# Patient Record
Sex: Female | Born: 1950
Health system: Southern US, Community
[De-identification: ages and names within clinical notes are randomized; demographics above are authoritative.]

## PROBLEM LIST (undated history)

## (undated) DIAGNOSIS — I428 Other cardiomyopathies: Secondary | ICD-10-CM

## (undated) DIAGNOSIS — G4730 Sleep apnea, unspecified: Secondary | ICD-10-CM

## (undated) DIAGNOSIS — N179 Acute kidney failure, unspecified: Secondary | ICD-10-CM

## (undated) DIAGNOSIS — J189 Pneumonia, unspecified organism: Secondary | ICD-10-CM

## (undated) DIAGNOSIS — I10 Essential (primary) hypertension: Secondary | ICD-10-CM

## (undated) DIAGNOSIS — I723 Aneurysm of iliac artery: Secondary | ICD-10-CM

## (undated) DIAGNOSIS — K219 Gastro-esophageal reflux disease without esophagitis: Secondary | ICD-10-CM

## (undated) DIAGNOSIS — I469 Cardiac arrest, cause unspecified: Secondary | ICD-10-CM

## (undated) DIAGNOSIS — Z950 Presence of cardiac pacemaker: Secondary | ICD-10-CM

## (undated) DIAGNOSIS — I472 Ventricular tachycardia, unspecified: Secondary | ICD-10-CM

## (undated) DIAGNOSIS — R519 Headache, unspecified: Secondary | ICD-10-CM

## (undated) DIAGNOSIS — Z95810 Presence of automatic (implantable) cardiac defibrillator: Secondary | ICD-10-CM

## (undated) DIAGNOSIS — I219 Acute myocardial infarction, unspecified: Secondary | ICD-10-CM

## (undated) DIAGNOSIS — I5022 Chronic systolic (congestive) heart failure: Secondary | ICD-10-CM

## (undated) DIAGNOSIS — M1990 Unspecified osteoarthritis, unspecified site: Secondary | ICD-10-CM

## (undated) HISTORY — DX: Acute kidney failure, unspecified: N17.9

## (undated) HISTORY — DX: Ventricular tachycardia, unspecified: I47.20

## (undated) HISTORY — PX: ABDOMINAL HYSTERECTOMY: SHX81

## (undated) HISTORY — DX: Morbid (severe) obesity due to excess calories: E66.01

## (undated) HISTORY — PX: LAPAROSCOPIC GASTRIC BANDING: SHX1100

## (undated) HISTORY — DX: Essential (primary) hypertension: I10

## (undated) HISTORY — DX: Chronic systolic (congestive) heart failure: I50.22

## (undated) HISTORY — PX: JOINT REPLACEMENT: SHX530

## (undated) HISTORY — DX: Unspecified osteoarthritis, unspecified site: M19.90

## (undated) HISTORY — DX: Other cardiomyopathies: I42.8

## (undated) HISTORY — DX: Acute myocardial infarction, unspecified: I21.9

## (undated) HISTORY — PX: CHOLECYSTECTOMY: SHX55

## (undated) HISTORY — DX: Presence of automatic (implantable) cardiac defibrillator: Z95.810

## (undated) HISTORY — DX: Ventricular tachycardia: I47.2

## (undated) HISTORY — DX: Pneumonia, unspecified organism: J18.9

## (undated) HISTORY — PX: KNEE ARTHROSCOPY: SUR90

---

## 1999-12-26 ENCOUNTER — Emergency Department (HOSPITAL_COMMUNITY): Admission: EM | Admit: 1999-12-26 | Discharge: 1999-12-26 | Payer: Self-pay | Admitting: Emergency Medicine

## 2004-02-07 ENCOUNTER — Emergency Department (HOSPITAL_COMMUNITY): Admission: EM | Admit: 2004-02-07 | Discharge: 2004-02-07 | Payer: Self-pay | Admitting: Emergency Medicine

## 2004-02-19 ENCOUNTER — Encounter (INDEPENDENT_AMBULATORY_CARE_PROVIDER_SITE_OTHER): Payer: Self-pay | Admitting: Specialist

## 2004-02-19 ENCOUNTER — Ambulatory Visit (HOSPITAL_COMMUNITY): Admission: RE | Admit: 2004-02-19 | Discharge: 2004-02-19 | Payer: Self-pay

## 2007-02-16 ENCOUNTER — Emergency Department (HOSPITAL_COMMUNITY): Admission: EM | Admit: 2007-02-16 | Discharge: 2007-02-16 | Payer: Self-pay | Admitting: Emergency Medicine

## 2007-03-23 ENCOUNTER — Encounter: Admission: RE | Admit: 2007-03-23 | Discharge: 2007-03-23 | Payer: Self-pay | Admitting: Family Medicine

## 2007-06-24 ENCOUNTER — Emergency Department (HOSPITAL_COMMUNITY): Admission: EM | Admit: 2007-06-24 | Discharge: 2007-06-24 | Payer: Self-pay | Admitting: Emergency Medicine

## 2008-02-08 ENCOUNTER — Encounter: Admission: RE | Admit: 2008-02-08 | Discharge: 2008-02-08 | Payer: Self-pay | Admitting: Family Medicine

## 2009-01-08 ENCOUNTER — Emergency Department (HOSPITAL_COMMUNITY): Admission: EM | Admit: 2009-01-08 | Discharge: 2009-01-09 | Payer: Self-pay | Admitting: Emergency Medicine

## 2009-02-12 ENCOUNTER — Emergency Department (HOSPITAL_COMMUNITY): Admission: EM | Admit: 2009-02-12 | Discharge: 2009-02-12 | Payer: Self-pay | Admitting: Emergency Medicine

## 2009-04-12 ENCOUNTER — Inpatient Hospital Stay (HOSPITAL_COMMUNITY): Admission: RE | Admit: 2009-04-12 | Discharge: 2009-04-17 | Payer: Self-pay | Admitting: Orthopedic Surgery

## 2009-04-24 ENCOUNTER — Emergency Department (HOSPITAL_COMMUNITY): Admission: EM | Admit: 2009-04-24 | Discharge: 2009-04-24 | Payer: Self-pay | Admitting: Emergency Medicine

## 2009-06-27 ENCOUNTER — Inpatient Hospital Stay (HOSPITAL_COMMUNITY): Admission: RE | Admit: 2009-06-27 | Discharge: 2009-07-01 | Payer: Self-pay | Admitting: Orthopedic Surgery

## 2010-03-09 ENCOUNTER — Emergency Department (HOSPITAL_COMMUNITY): Admission: EM | Admit: 2010-03-09 | Discharge: 2010-03-09 | Payer: Self-pay | Admitting: Emergency Medicine

## 2010-10-26 ENCOUNTER — Emergency Department (HOSPITAL_COMMUNITY)
Admission: EM | Admit: 2010-10-26 | Discharge: 2010-10-26 | Disposition: A | Discharge status: Home / Self Care | Payer: 59 | Attending: Emergency Medicine | Admitting: Emergency Medicine

## 2010-10-26 ENCOUNTER — Emergency Department (HOSPITAL_COMMUNITY): Payer: 59

## 2010-10-26 DIAGNOSIS — Z79899 Other long term (current) drug therapy: Secondary | ICD-10-CM | POA: Insufficient documentation

## 2010-10-26 DIAGNOSIS — M25519 Pain in unspecified shoulder: Secondary | ICD-10-CM | POA: Insufficient documentation

## 2010-10-26 DIAGNOSIS — Z7982 Long term (current) use of aspirin: Secondary | ICD-10-CM | POA: Insufficient documentation

## 2010-10-26 DIAGNOSIS — M25619 Stiffness of unspecified shoulder, not elsewhere classified: Secondary | ICD-10-CM | POA: Insufficient documentation

## 2010-10-26 DIAGNOSIS — M719 Bursopathy, unspecified: Secondary | ICD-10-CM | POA: Insufficient documentation

## 2010-10-26 DIAGNOSIS — I10 Essential (primary) hypertension: Secondary | ICD-10-CM | POA: Insufficient documentation

## 2010-10-26 DIAGNOSIS — M67919 Unspecified disorder of synovium and tendon, unspecified shoulder: Secondary | ICD-10-CM | POA: Insufficient documentation

## 2010-11-23 LAB — GLUCOSE, CAPILLARY: Glucose-Capillary: 120 mg/dL — ABNORMAL HIGH (ref 70–99)

## 2010-12-11 LAB — COMPREHENSIVE METABOLIC PANEL
ALT: 9 U/L (ref 0–35)
AST: 11 U/L (ref 0–37)
Albumin: 2.9 g/dL — ABNORMAL LOW (ref 3.5–5.2)
CO2: 30 mEq/L (ref 19–32)
Calcium: 9 mg/dL (ref 8.4–10.5)
Chloride: 105 mEq/L (ref 96–112)
GFR calc Af Amer: >60 mL/min (ref >60)
GFR calc non Af Amer: >60 mL/min (ref >60)
Sodium: 140 mEq/L (ref 135–145)

## 2010-12-11 LAB — TYPE AND SCREEN
ABO/RH(D): O POS
DAT, IgG: NEGATIVE

## 2010-12-11 LAB — BASIC METABOLIC PANEL
CO2: 33 mEq/L — ABNORMAL HIGH (ref 19–32)
Calcium: 8.1 mg/dL — ABNORMAL LOW (ref 8.4–10.5)
Creatinine, Ser: 0.88 mg/dL (ref 0.4–1.2)
GFR calc Af Amer: >60 mL/min (ref >60)
GFR calc non Af Amer: >60 mL/min (ref >60)
Glucose, Bld: 112 mg/dL — ABNORMAL HIGH (ref 70–99)
Potassium: 3.1 mEq/L — ABNORMAL LOW (ref 3.5–5.1)

## 2010-12-11 LAB — DIFFERENTIAL
Eosinophils Absolute: 0.1 10*3/uL (ref 0.0–0.7)
Eosinophils Relative: 2 % (ref 0–5)
Lymphs Abs: 2.3 10*3/uL (ref 0.7–4.0)
Monocytes Absolute: 0.4 10*3/uL (ref 0.1–1.0)

## 2010-12-11 LAB — HEMOGLOBIN AND HEMATOCRIT, BLOOD
HCT: 31 % — ABNORMAL LOW (ref 36.0–46.0)
Hemoglobin: 9.8 g/dL — ABNORMAL LOW (ref 12.0–15.0)

## 2010-12-11 LAB — URINALYSIS, ROUTINE W REFLEX MICROSCOPIC
Glucose, UA: NEGATIVE mg/dL
Protein, ur: NEGATIVE mg/dL
Specific Gravity, Urine: 1.027 (ref 1.005–1.030)
pH: 6 (ref 5.0–8.0)

## 2010-12-11 LAB — URINE MICROSCOPIC-ADD ON

## 2010-12-11 LAB — PROTIME-INR
INR: 1.58 — ABNORMAL HIGH (ref 0.00–1.49)
INR: 1.69 — ABNORMAL HIGH (ref 0.00–1.49)
Prothrombin Time: 17.2 seconds — ABNORMAL HIGH (ref 11.6–15.2)
Prothrombin Time: 18.7 seconds — ABNORMAL HIGH (ref 11.6–15.2)
Prothrombin Time: 19.7 seconds — ABNORMAL HIGH (ref 11.6–15.2)
Prothrombin Time: 13.2 seconds (ref 11.6–15.2)

## 2010-12-11 LAB — CBC
HCT: 31.9 % — ABNORMAL LOW (ref 36.0–46.0)
RBC: 3.88 MIL/uL (ref 3.87–5.11)
WBC: 5.8 10*3/uL (ref 4.0–10.5)

## 2010-12-13 LAB — DIFFERENTIAL
Basophils Absolute: 0 10*3/uL (ref 0.0–0.1)
Basophils Absolute: 0 10*3/uL (ref 0.0–0.1)
Basophils Relative: 1 % (ref 0–1)
Basophils Relative: 0 % (ref 0–1)
Eosinophils Absolute: 0.1 10*3/uL (ref 0.0–0.7)
Eosinophils Relative: 2 % (ref 0–5)
Lymphocytes Relative: 30 % (ref 12–46)
Lymphocytes Relative: 50 % — ABNORMAL HIGH (ref 12–46)
Lymphs Abs: 3 10*3/uL (ref 0.7–4.0)
Monocytes Absolute: 0.3 10*3/uL (ref 0.1–1.0)
Monocytes Absolute: 0.3 10*3/uL (ref 0.1–1.0)
Monocytes Relative: 5 % (ref 3–12)
Neutro Abs: 4.1 10*3/uL (ref 1.7–7.7)
Neutro Abs: 2.6 10*3/uL (ref 1.7–7.7)
Neutrophils Relative %: 64 % (ref 43–77)
Neutrophils Relative %: 43 % (ref 43–77)

## 2010-12-13 LAB — CBC
HCT: 33.3 % — ABNORMAL LOW (ref 36.0–46.0)
Hemoglobin: 10.7 g/dL — ABNORMAL LOW (ref 12.0–15.0)
MCHC: 32.1 g/dL (ref 30.0–36.0)
MCV: 82.8 fL (ref 78.0–100.0)
Platelets: 376 10*3/uL (ref 150–400)
Platelets: 221 10*3/uL (ref 150–400)
RBC: 4.02 MIL/uL (ref 3.87–5.11)
RBC: 4.41 MIL/uL (ref 3.87–5.11)
RDW: 15.4 % (ref 11.5–15.5)
WBC: 6.4 10*3/uL (ref 4.0–10.5)
WBC: 6.1 10*3/uL (ref 4.0–10.5)

## 2010-12-13 LAB — PROTIME-INR
INR: 2.1 — ABNORMAL HIGH (ref 0.00–1.49)
INR: 1.7 — ABNORMAL HIGH (ref 0.00–1.49)
INR: 2 — ABNORMAL HIGH (ref 0.00–1.49)
Prothrombin Time: 23.7 seconds — ABNORMAL HIGH (ref 11.6–15.2)
Prothrombin Time: 14.1 seconds (ref 11.6–15.2)
Prothrombin Time: 18 seconds — ABNORMAL HIGH (ref 11.6–15.2)
Prothrombin Time: 22.4 seconds — ABNORMAL HIGH (ref 11.6–15.2)

## 2010-12-13 LAB — URINALYSIS, ROUTINE W REFLEX MICROSCOPIC
Glucose, UA: NEGATIVE mg/dL
Protein, ur: NEGATIVE mg/dL
Specific Gravity, Urine: 1.024 (ref 1.005–1.030)
Urobilinogen, UA: 1 mg/dL (ref 0.0–1.0)

## 2010-12-13 LAB — ABO/RH: ABO/RH(D): O POS

## 2010-12-13 LAB — TYPE AND SCREEN
ABO/RH(D): O POS
Antibody Screen: POSITIVE
Donor AG Type: NEGATIVE
PT AG Type: NEGATIVE

## 2010-12-13 LAB — COMPREHENSIVE METABOLIC PANEL
ALT: 11 U/L (ref 0–35)
AST: 15 U/L (ref 0–37)
Albumin: 3.3 g/dL — ABNORMAL LOW (ref 3.5–5.2)
Alkaline Phosphatase: 82 U/L (ref 39–117)
BUN: 11 mg/dL (ref 6–23)
CO2: 35 mEq/L — ABNORMAL HIGH (ref 19–32)
Calcium: 9.2 mg/dL (ref 8.4–10.5)
Chloride: 103 mEq/L (ref 96–112)
Creatinine, Ser: 0.83 mg/dL (ref 0.4–1.2)
GFR calc Af Amer: >60 mL/min (ref >60)
GFR calc non Af Amer: >60 mL/min (ref >60)
Glucose, Bld: 95 mg/dL (ref 70–99)
Potassium: 3.9 mEq/L (ref 3.5–5.1)
Sodium: 141 mEq/L (ref 135–145)
Total Bilirubin: 0.5 mg/dL (ref 0.3–1.2)
Total Protein: 7.7 g/dL (ref 6.0–8.3)

## 2010-12-13 LAB — HEMOGLOBIN AND HEMATOCRIT, BLOOD
HCT: 30.7 % — ABNORMAL LOW (ref 36.0–46.0)
Hemoglobin: 9.6 g/dL — ABNORMAL LOW (ref 12.0–15.0)
Hemoglobin: 9.8 g/dL — ABNORMAL LOW (ref 12.0–15.0)

## 2010-12-15 LAB — DIFFERENTIAL
Basophils Absolute: 0 10*3/uL (ref 0.0–0.1)
Eosinophils Absolute: 0 10*3/uL (ref 0.0–0.7)
Eosinophils Relative: 0 % (ref 0–5)
Lymphocytes Relative: 20 % (ref 12–46)

## 2010-12-15 LAB — URINALYSIS, ROUTINE W REFLEX MICROSCOPIC
Glucose, UA: NEGATIVE mg/dL
Specific Gravity, Urine: 1.029 (ref 1.005–1.030)
pH: 6.5 (ref 5.0–8.0)

## 2010-12-15 LAB — COMPREHENSIVE METABOLIC PANEL
ALT: 11 U/L (ref 0–35)
AST: 16 U/L (ref 0–37)
CO2: 29 mEq/L (ref 19–32)
Chloride: 106 mEq/L (ref 96–112)
Creatinine, Ser: 0.82 mg/dL (ref 0.4–1.2)
GFR calc Af Amer: >60 mL/min (ref >60)
GFR calc non Af Amer: >60 mL/min (ref >60)
Total Bilirubin: 0.4 mg/dL (ref 0.3–1.2)

## 2010-12-15 LAB — CBC
MCHC: 32.7 g/dL (ref 30.0–36.0)
RBC: 4.31 MIL/uL (ref 3.87–5.11)
RDW: 15.1 % (ref 11.5–15.5)

## 2010-12-15 LAB — POCT CARDIAC MARKERS

## 2010-12-15 LAB — URINE MICROSCOPIC-ADD ON

## 2010-12-15 LAB — PROTIME-INR: Prothrombin Time: 13.8 seconds (ref 11.6–15.2)

## 2010-12-16 LAB — BASIC METABOLIC PANEL
BUN: 15 mg/dL (ref 6–23)
CO2: 31 mEq/L (ref 19–32)
Chloride: 103 mEq/L (ref 96–112)
Creatinine, Ser: 0.89 mg/dL (ref 0.4–1.2)
GFR calc Af Amer: >60 mL/min (ref >60)

## 2010-12-16 LAB — CBC
MCHC: 32.6 g/dL (ref 30.0–36.0)
MCV: 82.7 fL (ref 78.0–100.0)
RBC: 4.09 MIL/uL (ref 3.87–5.11)

## 2010-12-16 LAB — DIFFERENTIAL
Basophils Relative: 1 % (ref 0–1)
Eosinophils Absolute: 0.2 10*3/uL (ref 0.0–0.7)
Eosinophils Relative: 3 % (ref 0–5)
Monocytes Relative: 7 % (ref 3–12)
Neutrophils Relative %: 49 % (ref 43–77)

## 2010-12-16 LAB — POCT CARDIAC MARKERS: Troponin i, poc: <0.05 ng/mL (ref 0.00–0.09)

## 2010-12-16 LAB — HEPATIC FUNCTION PANEL
Albumin: 3 g/dL — ABNORMAL LOW (ref 3.5–5.2)
Bilirubin, Direct: <0.1 mg/dL (ref 0.0–0.3)
Total Bilirubin: 0.3 mg/dL (ref 0.3–1.2)

## 2010-12-16 LAB — LIPASE, BLOOD: Lipase: 15 U/L (ref 11–59)

## 2011-01-20 NOTE — Discharge Summary (Signed)
NAMEKHALEA, VENTURA               ACCOUNT NO.:  [id number]   MEDICAL RECORD NO.:  [id number]          PATIENT TYPE:  INP   LOCATION:  1616                         FACILITY:  [facility]   PHYSICIAN:  Georges Lynch. Gioffre, M.D.DATE OF BIRTH:  March 26, 1951   DATE OF ADMISSION:  04/12/2009  DATE OF DISCHARGE:                               DISCHARGE SUMMARY   ADMITTING DIAGNOSES:  1. Degenerative arthritis of the left hip.  2. Migraine headaches.  3. Hypertension.  4. Heart murmur.  5. Gastritis.   DISCHARGE DIAGNOSES:  1. Osteoarthritis of the left hip, status post left total hip      arthroplasty.  2. Migraine headaches.  3. Hypertension.  4. Heart murmur.  5. Gastritis.   PROCEDURE:  On April 12, 2009, patient had left total hip arthroplasty.   SURGEON:  Dr. Darrelyn Hillock.   ASSISTANT:  Dr. Charlann Boxer and Rozell Searing, P.A.-C.   Patient underwent general anesthesia.   CONSULTS:  None.   BRIEF HISTORY:  Ms. Wakeman has been followed by Dr. Darrelyn Hillock for  osteoarthritis in her hips bilaterally that has been worsening in her  left hip.  X-ray reveals end-stage arthritis in the left hip.  Patient  has not been able to complete her activities of daily living because of  the pain that she has with trying to stand and walk in the left hip.  Patient now presents for total hip arthroplasty.   LABORATORY DATA:  Preoperative CBC shows hemoglobin of 11.7, hematocrit  of 36.4, white blood cell count of 6.1, and a platelet count of 221.  Preoperative chemistry panel shows sodium of 141, potassium of 3.9,  chloride of 103, bicarb 35, glucose 95, BUN 11, and creatinine 0.83.  Preoperative UA is within normal limits.  Preoperative INR is 1.0.  Patient was not on Coumadin prior to surgery.  Serial CBCs were followed  through the patient's stay.  They dropped as low as 9.6.  Patient did  not require blood transfusion.  Patient's INR April 14, 2009, is 1.5.  She will remain on Coumadin until 3 weeks after  the date of surgery.   EKG:  EKG was performed, the last one that we have on file is Jan 25, 2003.  This was done in the emergency department, reading by Dr. Effie Shy,  and it shows no change since last tracing.  Regular rate and rhythm.  No  ST elevation or depression.   HOSPITAL COURSE:  The patient was admitted to [facility].  She  tolerated the procedure well.  Later, she was transferred to the  recovery room in the orthopedic floor.  Patient was started on a PCA and  oral analgesics for pain control following surgery.  Patient underwent  general anesthesia.  She had no difficulties with this.  Postoperative  day 1, patient had already gotten up out of bed with physical therapy.  Her hemoglobin and hematocrit were stable at 9.8 and 30.7.  Vital signs  were normal.  Patient was afebrile.  Her INR had only gotten up to 1.1  at this point.  At that time,  patient's wound showed no signs of  infection and appeared well approximated.  Postoperative day 2, patient  doing well.  Patient was able to get out of bed and ambulate with  assistance.  Hemoglobin stable at 9.5.  Patient's INR on postoperative  day 2 was 1.5.  Patient able to urinate and have bowel movements without  difficulty.  Postoperative day 3, patient was resting comfortably.  She  was sleeping when we entered the room.  She was easily arousable.  Patient was then able to walk 40 feet with assistance.  Patient's mood  appears well approximated without signs of infection.  Patient's vitals  signs are stable.  She has been afebrile throughout her hospital stay  and we plan to discharge her to skilled nursing facility in the morning.   DISCHARGE PLAN:  Patient to be transferred to a skilled nursing  facility.   DISCHARGE DIAGNOSES:  Please see above.   DISCHARGE MEDICATIONS:  1. Hydrocolapat 7.5/650 mg 1 tab daily as needed.  2. Amitriptyline 25 mg 3 tabs at bedtime.  3. Pantoprazole 40 mg 1 tab daily in the  morning.  4. Verapamil 240 mg 1 tab daily in the morning.  5. Furosemide 40 mg 1 tab daily in the morning.  6. Omega-3 fish oil.  Patient to hold this until she is finished with      Coumadin.  7. Vitamin D, patient to hold this until finished Coumadin.  8. Vitamin E, patient to hold this until finished with Coumadin.  9. Super B-Complex, patient to hold until finished with Coumadin.  10.Bufferin, patient to hold until finished with Coumadin.  11.Tylenol Extra Strength 500 mg.  Patient able to take this as      needed.  12.Percocet 10/650 one tab p.o. every 4 to 6 hours p.r.n. pain.  13.Robaxin 500 mg 1 tab p.o. t.i.d. p.r.n. muscle spasms.  14.Coumadin 5 mg dose per pharmacy, INR needs to be between 2 and 3.   DIET:  No restrictions, progress as tolerated.   ACTIVITY:  Patient is weightbearing probably about 50% to 75% of her  body weight.  Patient is to be ambulating with walker.  She needs to  continue gait training, ambulation, ADLs, PT, and OT at the skilled  nursing facility.  She is on the total hip protocol.  Patient may start  showering, may need assistance with this, however, she is not to  submerge the incision in water, cover the incision with Saran wrap for  the next 3 days.  When showering, take dressing off, cover with Saran  wrap, shower, put on a clean dressing.  Daily dressing changes need to  continue while at the skilled nursing facility.   FOLLOWUP:  She is to follow up 2 weeks from date of surgery.  Please  call the office at [phone] to arrange this appointment.  Follow up with  this patient at the [facility].   DISPOSITION:  Skilled nursing facility.   CONDITION ON DISCHARGE:  Improving.      Rozell Searing, PAC    ______________________________  Georges Lynch Darrelyn Hillock, M.D.    LD/MEDQ  D:  04/15/2009  T:  04/15/2009  Job:  [id number]

## 2011-01-20 NOTE — Discharge Summary (Signed)
Lori Beard, Lori Beard               ACCOUNT NO.:  [id number]   MEDICAL RECORD NO.:  [id number]          PATIENT TYPE:  INP   LOCATION:  1616                         [facility]ACILITY:  [facility]   PHYSICIAN:  Rozell Searing, [facility]    DATE OF BIRTH:  [birth year]   DATE OF ADMISSION:  04/12/2009  DATE OF DISCHARGE:  04/16/2009                               DISCHARGE SUMMARY   DISPOSITION:  Patient to be discharged to skilled nursing facility.   DICTATION ENDED HERE      Rozell Searing, [facility]     LD/MEDQ  D:  04/15/2009  T:  04/15/2009  Job:  [id number]

## 2011-01-20 NOTE — Discharge Summary (Signed)
NAMETASMINE, HIPWELL               ACCOUNT NO.:  [id number]   MEDICAL RECORD NO.:  [id number]          PATIENT TYPE:  INP   LOCATION:  1616                         FACILITY:  [facility]   PHYSICIAN:  Rozell Searing, [facility]    DATE OF BIRTH:  07-Mar-1951   DATE OF ADMISSION:  04/12/2009  DATE OF DISCHARGE:                               DISCHARGE SUMMARY   ADMITTING DIAGNOSES:  1. Degenerative arthritis of the left hip.  2. Migraine headaches.  3. Hypertension.  4. Heart murmur.  5. Gastritis.   DISCHARGE DIAGNOSES:  1. Degenerative arthritis of the left hip, status post left hip total      arthroplasty.  2. Migraine headaches.  3. Hypertension.  4. Heart murmur.  5. Gastritis.  6. Arthritis.   PAST SURGICAL HISTORY:  1. Hysterectomy.  2. Cholecystectomy.  3. Right knee arthroscopy.   DICTATION WAS CANCELLED.      Rozell Searing, [facility]     LD/MEDQ  D:  04/15/2009  T:  04/15/2009  Job:  [id number]

## 2011-01-20 NOTE — Op Note (Signed)
NAMEMIKALA, PODOLL               ACCOUNT NO.:  [id number]   MEDICAL RECORD NO.:  [id number]          PATIENT TYPE:  INP   LOCATION:  1616                         FACILITY:  [facility]   PHYSICIAN:  Georges Lynch. Gioffre, M.D.DATE OF BIRTH:  03-06-1951   DATE OF PROCEDURE:  04/12/2009  DATE OF DISCHARGE:                               OPERATIVE REPORT   SURGEON:  Dr. Darrelyn Hillock.   ASSISTANTS:  1. Dr. Durene Romans.  2. Rozell Searing, PA.   PREOPERATIVE DIAGNOSIS:  Degenerative arthritis of the left hip.   POSTOPERATIVE DIAGNOSIS:  Degenerative arthritis of the left hip.   OPERATION:  Left total hip arthroplasty utilizing the DePuy system.  I  utilized a size 50 Pinnacle cup with one screw for fixation.  The  femoral head stem was a Tri-Lock stem size 2, high offset.  The C  tapered head was a +1.5 thirty-six mm diameter.  The metal cup insert  was a 50-mm outside diameter, 36 mm inside diameter and the hole  eliminator was used in the cup.   PROCEDURE:  Under general anesthesia with the patient on the right side  and left side up, the patient had 2 grams of IV Ancef.  At this time,  routine orthopedic prep and draping of the left hip was carried out.  Bleeders identified and cauterized.  Self-retaining retractors were  inserted.  At this particular time, I went down and identified the  iliotibial band.  I made an incision in the band and extended it up  proximally and then I dissected the gluteal muscle by blunt dissection  with great care taken not to injure underlying sciatic nerve.  Following  that, I went down and inserted the retractors deeper with great care  once again not put any pressure on the sciatic nerve.  I then detached  the external rotators by sharp dissection with the Bovie, cauterized the  various bleeders.  We then incised the capsule and did a capsulectomy,  dislocated the head and amputated the femoral neck at the appropriate  angle.  Following that, I went down and  then utilized the box osteotome  to remove a portion of the cancellus bone from the greater trochanter.  I used a widening reamer following that.  I then began rasping up to a  size 2 for a size 2 Tri-Lock stem.  Once this was done, a sponge was  packed into the femoral canal which was later removed.  At this time, we  then reamed the acetabulum up to a size 49 for a 50-mm Pinnacle cup.  The Pinnacle cup was inserted and we measured the appropriate angle with  the Charnley guide.  We then inserted one screw for fixation.  The cup  was solidly fixed in the acetabulum even without the screw, but we did  insert one screw.  We then inserted our trial liner, went through trials  and selected a high offset size 2 femoral Tri-Lock component with a +1.5  C tapered head.  We then removed the trials and inserted our permanent  metal insert cup.  We then  irrigated out the femoral shaft again.  We  then inserted our permanent size 2 Tri-Lock stem.  We then inserted our  ball which was a 1.5 metal ball, 36 mm diameter.  We made sure the soft  tissue was cleared from the acetabulum, reduced the hip and took the hip  through  motion.  We had excellent fixation.  We thoroughly irrigated out the  hip.  I inserted two pieces of thrombin-soaked Gelfoam for hemostasis  purposes.  The wound was closed in layers in the usual fashion.  The  skin was closed with metal staples.  A sterile Neosporin dressing was  applied.           ______________________________  Georges Lynch Darrelyn Hillock, M.D.     RAG/MEDQ  D:  04/12/2009  T:  04/13/2009  Job:  [id number]   cc:   Erling Conte, MD  (cardiologist) [facility]

## 2011-01-23 NOTE — Op Note (Signed)
Lori Beard, Lori Beard                         ACCOUNT NO.:  [id number]   MEDICAL RECORD NO.:  [id number]                   PATIENT TYPE:  AMB   LOCATION:  DAY                                  FACILITY:  [facility]   PHYSICIAN:  Lorre Munroe., M.D.            DATE OF BIRTH:  1951-02-20   DATE OF PROCEDURE:  02/19/2004  DATE OF DISCHARGE:                                 OPERATIVE REPORT   PREOPERATIVE DIAGNOSIS:  Symptomatic gallstones.   POSTOPERATIVE DIAGNOSIS:  Symptomatic gallstones.   OPERATION:  Laparoscopic cholecystectomy.   SURGEON:  Zigmund Daniel, M.D.   ANESTHESIA:  General.   DESCRIPTION OF PROCEDURE:  After the patient was monitored and anesthetized  and had routine preparation and draping of the abdomen, I first infiltrated  local anesthetic just below the umbilicus and made a longitudinal incision  connecting the top of the previous lower midline incision and dissected down  to the fascia, incised it longitudinally, then bluntly entered the  peritoneum.  I noted that there were some omental adhesions in that area.  I  put in a 0 Vicryl pursestring suture and very carefully inserted a Hasson  cannula, then put in the camera.  I saw no evidence of injury to the bowel  and, in fact, the bowel was not adherent to the anterior abdominal wall.  There were a lot of adhesions in the lower midline and upper midline, but I  was able to pass the scope around the adhesions and get a good view of the  liver and gallbladder.  I then anesthetized three additional sites and put  in three ports.  I grasped the fundus of the gallbladder and retracted it  toward the right shoulder and pulled the infundibulum of the gallbladder  laterally.  I had a good view of the hepatoduodenal ligament.  I dissected  out the cystic duct and cystic artery.  I noted the emergence of the cystic  duct from the infundibulum of the gallbladder quite clearly.  I could also  see the common bile duct as  a definite structure separately.  I clipped the  cystic duct with four clips and the cystic artery with three clips and cut  each structure between the two closest to the gallbladder.  I then dissected  the gallbladder from the liver with the cautery, getting hemostasis with the  cautery.  Hemostasis was excellent.  I removed the gallbladder through the  umbilical incision and tied the pursestring suture.  I then inspected the  operative area again and found it to be free of bleeding and leakage of  bile.  I removed the lateral ports under direct vision and then allowed the  CO2 to escape and removed the epigastric port.  I closed all skin incisions  with intracuticular 4-0 Vicryl and Steri-Strips.  She tolerated the  operation very well.  Lorre Munroe., M.D.   WB/MEDQ  D:  02/19/2004  T:  02/19/2004  Job:  [id number]   cc:   L. Lupe Carney, M.D.  [address] [place]  [place] [address]  Fax: [phone]

## 2011-01-23 NOTE — H&P (Signed)
[name], [name]               ACCOUNT NO.:  [id number]   MEDICAL RECORD NO.:  [id number]          PATIENT TYPE:  INP   LOCATION:  NA                           FACILITY:  [facility]   PHYSICIAN:  Lori Lynch. Beard, M.D.DATE OF BIRTH:  [birth year]   DATE OF ADMISSION:  DATE OF DISCHARGE:                              HISTORY & PHYSICAL   CHIEF COMPLAINT:  Bilateral hip pain, worse on the left than the right.   HISTORY OF PRESENT ILLNESS:  Lori Beard is a 60 year old female who  has been followed by Dr. Darrelyn Beard for worsening low back pain that  radiates to her groins bilaterally.  MRI was done of her low back  initially and did reveal some arthritic changes.  However, plain films  of her hips revealed severe arthritis in both hip joints.  The patient  states that the left hip is bothering her worse than the right and is  preventing her from completing daily activities.  The patient states  that she has tried somewhat conservative treatment with Indocin at home  without any relief.  The next step for her will be total hip  arthroplasty on the left hip.   ALLERGIES:  NO KNOWN DRUG ALLERGIES.  The patient also denies latex,  metal or food allergies.   CURRENT MEDICATIONS:  The patient did not bring a list of her current  medications.  Old list will be faxed to our office and then sent to the  hospital prior to her surgery date.   PRIMARY CARE PHYSICIAN:  L. Lori Beard, M.D.   PAST MEDICAL HISTORY:  1. Migraine headaches.  2. Hypertension.  3. Heart murmur.  4. Gastritis.  5. Arthritis.   PAST SURGICAL HISTORY:  1. Hysterectomy.  2. Cholecystectomy.  3. Right knee arthroscopy.   REVIEW OF SYSTEMS:  GENERAL:  The patient is negative for fever or  weight loss.  HEENT:  Negative for vision change or imbalance.  DERMATOLOGIC:  Unremarkable.  RESPIRATORY:  Negative for shortness of  breath.  Negative for cough.  Negative for wheezing.  CARDIOVASCULAR:  Negative for chest pain.   Negative for palpitations.  GASTROINTESTINAL:  Positive for occasional indigestion.  Negative for diarrhea.  Negative  for blood in the stool.  GENITOURINARY:  Negative for frequent  urination.  Negative for pain with urination.  Negative for frequent  urinary tract infections.   FAMILY MEDICAL HISTORY:  Father passed at age 56.  He had congestive  heart failure and diabetes.  Mother passed at age 80 with hypertension  and renal failure.   SOCIAL HISTORY:  The patient works in Engineering geologist.  She is on her feet all  day at work.  The patient plans to go home after surgery.  She has  family that will be able to care for her.  The patient denies smoking.  The patient denies alcohol intake.   PHYSICAL EXAMINATION:  VITAL SIGNS:  Pulse 80, respirations 16, blood  pressure 130/82.  GENERAL:  Alert and oriented in no acute distress.  Patient is a good  historian.  Patient is ambulating with a  cane at this time.  HEENT:  Unremarkable.  NECK:  Supple.  Full range of motion without lymphadenopathy.  HEART:  Regular rate and rhythm.  No murmur detected.  No rub.  No  gallop.  ABDOMEN:  Soft, nontender.  Bowel sounds are present.  No guarding with  palpation.  EXTREMITIES:  Pain with range of motion of hips bilaterally with the  left being more painful than the right.  SKIN:  Unremarkable.  PERIPHERAL VASCULAR:  Carotid pulses are 2+ bilaterally without bruit.  Radial pulses 2+ bilaterally.  Dorsalis pedis pulses 2+ bilaterally.   IMPRESSION:  Osteoarthritis of the hips bilaterally, left worse than the  right.   PLAN:  Left total hip arthroplasty.  It should be performed by Dr.  Darrelyn Beard April 12, 2009.  The patient should be cleared preoperatively  by her primary care physician, Dr. Lupe Beard.      Lori Beard, PAC    ______________________________  Lori Lynch Lori Beard, M.D.    LD/MEDQ  D:  04/10/2009  T:  04/10/2009  Job:  [id number]

## 2011-06-17 LAB — POCT CARDIAC MARKERS
CKMB, poc: <1 — ABNORMAL LOW
Myoglobin, poc: 47.2
Operator id: 279831
Troponin i, poc: <0.05
Troponin i, poc: <0.05

## 2011-09-29 ENCOUNTER — Ambulatory Visit (INDEPENDENT_AMBULATORY_CARE_PROVIDER_SITE_OTHER): Payer: 59

## 2011-09-29 DIAGNOSIS — R5381 Other malaise: Secondary | ICD-10-CM

## 2011-09-29 DIAGNOSIS — I10 Essential (primary) hypertension: Secondary | ICD-10-CM | POA: Diagnosis not present

## 2011-09-29 DIAGNOSIS — R5383 Other fatigue: Secondary | ICD-10-CM

## 2011-09-29 DIAGNOSIS — D500 Iron deficiency anemia secondary to blood loss (chronic): Secondary | ICD-10-CM

## 2011-09-29 DIAGNOSIS — K219 Gastro-esophageal reflux disease without esophagitis: Secondary | ICD-10-CM

## 2011-11-11 ENCOUNTER — Other Ambulatory Visit: Payer: Self-pay | Admitting: Internal Medicine

## 2011-11-11 NOTE — Telephone Encounter (Signed)
Lori Beard called from [facility] stating that patient is there yelling that we sent in her Hydrocodone refill and that the pharmacy is lying to her.  Per our notes, patient has not been seen since 09/29/11 for knee strain.  Advised pharmacist that most likely patient would need OV before we could refill.  We have not refilled yet.  Will have PA review and advise.

## 2011-11-11 NOTE — Telephone Encounter (Signed)
We have not seen her since 09/29/2011.  Her last visit was at ED where she got Norco - she has not requested it from [place] and she will need an OV before we give her more.

## 2011-11-11 NOTE — Telephone Encounter (Signed)
Pt is requesting a refill on hydrocodone.      

## 2011-11-12 ENCOUNTER — Ambulatory Visit: Payer: 59

## 2011-11-12 ENCOUNTER — Ambulatory Visit (INDEPENDENT_AMBULATORY_CARE_PROVIDER_SITE_OTHER): Payer: 59 | Admitting: Family Medicine

## 2011-11-12 DIAGNOSIS — M25559 Pain in unspecified hip: Secondary | ICD-10-CM

## 2011-11-12 DIAGNOSIS — M25569 Pain in unspecified knee: Secondary | ICD-10-CM

## 2011-11-12 MED ORDER — TRIAMCINOLONE ACETONIDE 40 MG/ML IJ SUSP: 80.0000 mg | Freq: Once | INTRAMUSCULAR | Status: DC

## 2011-11-12 MED ORDER — HYDROCODONE-ACETAMINOPHEN 7.5-750 MG PO TABS: 1.0000 | ORAL_TABLET | Freq: Two times a day (BID) | ORAL | Status: DC

## 2011-11-12 NOTE — Telephone Encounter (Signed)
SPOKE WITH PT==SHE WILL RTC FOR OV.

## 2011-11-12 NOTE — Progress Notes (Signed)
  Subjective:    Patient ID: Lori Beard, female    DOB: March 06, 1951, 61 y.o.   MRN: [id number]  HPI HPI: Right leg pain for 11/2 weeks. H/O bilat hip surgery in  Aug and Oct 2010. Chronic swelling of the RLE.  No change in swelling.  RLE pain worse with beingstationary. Weightbearing ecacerbates her pain. Was prescribed hydrocodone by Dr. Lupe Carney. Using one per day until she has ran out .  No numbness of tingling.  No CP or SOB.  Exam    Review of Systems  Constitutional: Negative for fever.  Respiratory: Negative for cough and shortness of breath.   Cardiovascular: Negative for chest pain.  Neurological: Negative for numbness.       Objective:   Physical Exam  Constitutional: She is oriented to person, place, and time. She appears well-developed. No distress.  Musculoskeletal: She exhibits tenderness. She exhibits no edema.       Right hip: She exhibits decreased range of motion and decreased strength.       Right knee: She exhibits decreased range of motion, deformity, abnormal alignment, bony tenderness and abnormal meniscus. She exhibits no erythema, no LCL laxity, normal patellar mobility and no MCL laxity. tenderness found.       Legs: Neurological: She is alert and oriented to person, place, and time.  Skin: Skin is warm and dry. No rash noted. She is not diaphoretic. No erythema.    2v right hip xrays:  Normal alignment of prosthesis.  2v right knee: multicompartment knee djd   Procedure: after obtaining verbal consent the right knee was cleansed with alcohol and betadine.  2:5 mixture of kenalog 40 mg/ml and marcaine was injected into the right knee using the anteriolateral approach.  Procedure tolerated.  No complications. Risk, benefits and treatment alternatives discussed prior to the procedure.                              Assessment & Plan:  Exacerbated right knee arthritis  Right hip pain and weakness  Injection as above.  Hydrocodone 7.5 mg 1 po  bid prn  F/U with her orthopaedist

## 2011-11-12 NOTE — Patient Instructions (Addendum)
Avoid oral NSAIDS due to elevated blood pressure. Follow up with your orthopaedist for further assessment. Ice your knees 20 minutes every 4-6 hours as needed, Wear and Tear Disorders of the Knee (Arthritis, Osteoarthritis) Everyone will experience wear and tear injuries (arthritis, osteoarthritis) of the knee. These are the changes we all get as we age. They come from the joint stress of daily living. The amount of cartilage damage in your knee and your symptoms determine if you need surgery. Mild problems require approximately two months recovery time. More severe problems take several months to recover. With mild problems, your surgeon may find worn and rough cartilage surfaces. With severe changes, your surgeon may find cartilage that has completely worn away and exposed the bone. Loose bodies of bone and cartilage, bone spurs (excess bone growth), and injuries to the menisci (cushions between the large bones of your leg) are also common. All of these problems can cause pain. For a mild wear and tear problem, rough cartilage may simply need to be shaved and smoothed. For more severe problems with areas of exposed bone, your surgeon may use an instrument for roughing up the bone surfaces to stimulate new cartilage growth. Loose bodies are usually removed. Torn menisci may be trimmed or repaired. ABOUT THE ARTHROSCOPIC PROCEDURE Arthroscopy is a surgical technique. It allows your orthopedic surgeon to diagnose and treat your knee injury with accuracy. The surgeon looks into your knee through a small scope. The scope is like a small (pencil-sized) telescope. Arthroscopy is less invasive than open knee surgery. You can expect a more rapid recovery. After the procedure, you will be moved to a recovery area until most of the effects of the medication have worn off. Your caregiver will discuss the test results with you. RECOVERY The severity of the arthritis and the type of procedure performed will determine  recovery time. Other important factors include age, physical condition, medical conditions, and the type of rehabilitation program. Strengthening your muscles after arthroscopy helps guarantee a better recovery. Follow your caregiver's instructions. Use crutches, rest, elevate, ice, and do knee exercises as instructed. Your caregivers will help you and instruct you with exercises and other physical therapy required to regain your mobility, muscle strength, and functioning following surgery. Only take over-the-counter or prescription medicines for pain, discomfort, or fever as directed by your caregiver.  SEEK MEDICAL CARE IF:   There is increased bleeding (more than a small spot) from the wound.   You notice redness, swelling, or increasing pain in the wound.   Pus is coming from wound.   You develop an unexplained oral temperature above 102 F (38.9 C) , or as your caregiver suggests.   You notice a foul smell coming from the wound or dressing.   You have severe pain with motion of the knee.  SEEK IMMEDIATE MEDICAL CARE IF:   You develop a rash.   You have difficulty breathing.   You have any allergic problems.  MAKE SURE YOU:   Understand these instructions.   Will watch your condition.   Will get help right away if you are not doing well or get worse.  Document Released: 08/21/2000 Document Revised: 08/13/2011 Document Reviewed: 01/18/2008 [facility] Patient Information 2012 [place], [place].Knee Injection Joint injections are shots. Your caregiver will place a needle into your knee joint. The needle is used to put medicine into the joint. These shots can be used to help treat different painful knee conditions such as osteoarthritis, bursitis, local flare-ups of rheumatoid arthritis,  and pseudogout. Anti-inflammatory medicines such as corticosteroids and anesthetics are the most common medicines used for joint and soft tissue injections.  PROCEDURE  The skin over the kneecap will be  cleaned with an antiseptic solution.   Your caregiver will inject a small amount of a local anesthetic (a medicine like Novocaine) just under the skin in the area that was cleaned.   After the area becomes numb, a second injection is done. This second injection usually includes an anesthetic and an anti-inflammatory medicine called a steroid or cortisone. The needle is carefully placed in between the kneecap and the knee, and the medicine is injected into the joint space.   After the injection is done, the needle is removed. Your caregiver may place a bandage over the injection site. The whole procedure takes no more than a couple of minutes.  BEFORE THE PROCEDURE  Wash all of the skin around the entire knee area. Try to remove any loose, scaling skin. There is no other specific preparation necessary unless advised otherwise by your caregiver. LET YOUR CAREGIVER KNOW ABOUT:   Allergies.   Medications taken including herbs, eye drops, over the counter medications, and creams.   Use of steroids (by mouth or creams).   Possible pregnancy, if applicable.   Previous problems with anesthetics or Novocaine.   History of blood clots (thrombophlebitis).   History of bleeding or blood problems.   Previous surgery.   Other health problems.  RISKS AND COMPLICATIONS Side effects from cortisone shots are rare. They include:   Slight bruising of the skin.   Shrinkage of the normal fatty tissue under the skin where the shot was given.   Increase in pain after the shot.   Infection.   Weakening of tendons or tendon rupture.   Allergic reaction to the medicine.   Diabetics may have a temporary increase in their blood sugar after a shot.   Cortisone can temporarily weaken the immune system. While receiving these shots, you should not get certain vaccines. Also, avoid contact with anyone who has chickenpox or measles. Especially if you have never had these diseases or have not been  previously immunized. Your immune system may not be strong enough to fight off the infection while the cortisone is in your system.  AFTER THE PROCEDURE   You can go home after the procedure.   You may need to put ice on the joint 15 to 20 minutes every 3 or 4 hours until the pain goes away.   You may need to put an elastic bandage on the joint.  HOME CARE INSTRUCTIONS   Only take over-the-counter or prescription medicines for pain, discomfort, or fever as directed by your caregiver.   You should avoid stressing the joint. Unless advised otherwise, avoid activities that put a lot of pressure on a knee joint, such as:   Jogging.   Bicycling.   Recreational climbing.   Hiking.   Laying down and elevating the leg/knee above the level of your heart can help to minimize swelling.  SEEK MEDICAL CARE IF:   You have repeated or worsening swelling.   There is drainage from the puncture area.   You develop red streaking that extends above or below the site where the needle was inserted.  SEEK IMMEDIATE MEDICAL CARE IF:   You develop a fever.   You have pain that gets worse even though you are taking pain medicine.   The area is red and warm, and you have trouble moving  the joint.  MAKE SURE YOU:   Understand these instructions.   Will watch your condition.   Will get help right away if you are not doing well or get worse.  Document Released: 11/15/2006 Document Revised: 08/13/2011 Document Reviewed: 08/12/2007 [facility] Patient Information 2012 [place], [place].

## 2011-11-15 ENCOUNTER — Other Ambulatory Visit: Payer: Self-pay

## 2011-12-30 DIAGNOSIS — M169 Osteoarthritis of hip, unspecified: Secondary | ICD-10-CM | POA: Diagnosis not present

## 2012-01-22 ENCOUNTER — Encounter (INDEPENDENT_AMBULATORY_CARE_PROVIDER_SITE_OTHER): Payer: Self-pay | Admitting: Surgery

## 2012-01-22 ENCOUNTER — Ambulatory Visit (INDEPENDENT_AMBULATORY_CARE_PROVIDER_SITE_OTHER): Payer: Commercial Managed Care - PPO | Admitting: Surgery

## 2012-01-22 VITALS — BP 162/108 | HR 72 | Temp 97.1°F | Resp 16 | Ht 64.0 in | Wt 252.4 lb

## 2012-01-22 DIAGNOSIS — Z1231 Encounter for screening mammogram for malignant neoplasm of breast: Secondary | ICD-10-CM

## 2012-01-22 DIAGNOSIS — E669 Obesity, unspecified: Secondary | ICD-10-CM | POA: Diagnosis not present

## 2012-01-22 NOTE — Progress Notes (Signed)
Chief Complaint:  Morbid obesity BMI 43   History of Present Illness:  Lori Beard is an 61 y.o. female who has been to our seminar and is referred by Dr. Lupe Beard with obesity issues.  She has had bilateral hip surgery for arthritis z was then reasonably healthy. She had a laparoscopic cholecystectomy by Lori Beard in June of 2005. She is thought about this surgery and was interested in lap band and wide answered her questions specifically about how it works and how this will change her life. She wants to proceed with the workup.  Past Medical History  Diagnosis Date  . Arthritis     Past Surgical History  Procedure Date  . Abdominal hysterectomy   . Knee arthroscopy   . Cholecystectomy   . Hip surgery     bilateral    Current Outpatient Prescriptions  Medication Sig Dispense Refill  . amitriptyline (ELAVIL) 25 MG tablet Take 75 mg by mouth at bedtime.      [name] CRANBERRY PO Take by mouth daily.      [name] etodolac (LODINE) 400 MG tablet Take 400 mg by mouth 2 (two) times daily.      . furosemide (LASIX) 40 MG tablet Take 40 mg by mouth as needed.      [name] HYDROcodone-acetaminophen (VICODIN ES) 7.5-750 MG per tablet Take 1 tablet by mouth 2 (two) times daily.  30 tablet  0  . nebivolol (BYSTOLIC) 5 MG tablet Take 5 mg by mouth daily.      . pantoprazole (PROTONIX) 40 MG tablet Take 40 mg by mouth daily before breakfast.      . verapamil (COVERA HS) 240 MG (CO) 24 hr tablet Take 240 mg by mouth daily before breakfast.       Current Facility-Administered Medications  Medication Dose Route Frequency Provider Last Rate Last Dose  . triamcinolone acetonide (KENALOG-40) injection 80 mg  80 mg Intra-articular Once Otho Darner, MD       Ace inhibitors and Morphine and related Family History  Problem Relation Age of Onset  . Kidney disease Mother   . Heart disease Father   . Cancer Father     colon  . Cancer Sister     breast   Social History:   reports that she has never  smoked. She does not have any smokeless tobacco history on file. She reports that she does not drink alcohol or use illicit drugs.   REVIEW OF SYSTEMS - PERTINENT POSITIVES ONLY: Negative for DVT  Physical Exam:   Blood pressure 162/108, pulse 72, temperature 97.1 F (36.2 C), temperature source Temporal, resp. rate 16, height 5\' 4"  (1.626 m), weight 252 lb 6.4 oz (114.488 kg). Body mass index is 43.32 kg/(m^2).  Gen:  WDWN AAF NAD  Neurological: Alert and oriented to person, place, and time. Motor and sensory function is grossly intact  Head: Normocephalic and atraumatic.  Eyes: Conjunctivae are normal. Pupils are equal, round, and reactive to light. No scleral icterus.  Neck: Normal range of motion. Neck supple. No tracheal deviation or thyromegaly present.  Cardiovascular:  SR without murmurs or gallops.  No carotid bruits Respiratory: Effort normal.  No respiratory distress. No chest wall tenderness. Breath sounds normal.  No wheezes, rales or rhonchi.  Abdomen:  Nontender, obese GU: Musculoskeletal: Normal range of motion. Extremities are nontender. No cyanosis, edema or clubbing noted Lymphadenopathy: No cervical, preauricular, postauricular or axillary adenopathy is present Skin: Skin is warm and dry. No rash  noted. No diaphoresis. No erythema. No pallor. Pscyh: Normal mood and affect. Behavior is normal. Judgment and thought content normal.   LABORATORY RESULTS: No results found for this or any previous visit (from the past 48 hour(s)).  RADIOLOGY RESULTS: No results found.  Problem List: There is no problem list on file for this patient.   Assessment & Plan: Morbid obesity with BMI 43.  Wants lapband .  Will begin the workup.      Matt B. Daphine Deutscher, MD, [facility] [phone] beeper [phone]  01/22/2012 11:20 AM

## 2012-01-22 NOTE — Progress Notes (Signed)
Addended by: Lenise Herald on: 01/22/2012 11:46 AM   Modules accepted: Orders

## 2012-01-28 ENCOUNTER — Other Ambulatory Visit: Payer: Self-pay | Admitting: Family Medicine

## 2012-02-05 ENCOUNTER — Encounter: Payer: Self-pay | Admitting: *Deleted

## 2012-02-05 ENCOUNTER — Encounter: Payer: 59 | Attending: Surgery | Admitting: *Deleted

## 2012-02-05 DIAGNOSIS — Z01818 Encounter for other preprocedural examination: Secondary | ICD-10-CM | POA: Insufficient documentation

## 2012-02-05 DIAGNOSIS — Z713 Dietary counseling and surveillance: Secondary | ICD-10-CM | POA: Insufficient documentation

## 2012-02-05 DIAGNOSIS — E669 Obesity, unspecified: Secondary | ICD-10-CM | POA: Insufficient documentation

## 2012-02-05 NOTE — Progress Notes (Addendum)
  Pre-Op Assessment Visit:  Pre-Operative LAGB Surgery  Medical Nutrition Therapy:  Appt start time: 0800   End time:  0900.  Patient was seen on 02/05/2012 for Pre-Operative LAGB Nutrition Assessment. Assessment and letter of approval faxed to [facility] Bariatric Surgery Program coordinator on 02/05/2012.  Approval letter sent to [facility] center and will be available in the chart under the media tab.  TANITA  BODY COMP RESULTS  02/05/12   %Fat 46.6%   Fat Mass (lbs) 119.5   Fat Free Mass (lbs) 137.0   Total Body Water (lbs) 100.5   Handouts given during visit include:  Pre-Op Goals   Bariatric Surgery Protein Shakes  Patient to call for Pre-Op and Post-Op Nutrition Education at [facility] when surgery is scheduled.

## 2012-02-05 NOTE — Patient Instructions (Signed)
   Follow Pre-Op Nutrition Goals to prepare for Lap Band Surgery.   Call the [facility] at [phone] once you have been given your surgery date to enrolled in the Pre-Op Nutrition Class. You will need to attend this nutrition class 3-4 weeks prior to your surgery.  

## 2012-02-10 ENCOUNTER — Other Ambulatory Visit: Payer: Self-pay | Admitting: Family Medicine

## 2012-02-10 MED ORDER — AMITRIPTYLINE HCL 25 MG PO TABS: 75.0000 mg | ORAL_TABLET | Freq: Every day | ORAL | Status: DC

## 2012-02-12 ENCOUNTER — Telehealth (INDEPENDENT_AMBULATORY_CARE_PROVIDER_SITE_OTHER): Payer: Self-pay | Admitting: Surgery

## 2012-02-12 NOTE — Telephone Encounter (Signed)
Per [facility] @ Dr Leveda Anna office, the patient came in to their office on 02/09/12 and answered to another patient's name, filled out the initial paperwork only for them to realize she was not the correct patient and that she did not have an appt that date. Per [facility] @ Dr Leveda Anna, they would like [place] to refer her to a provider that is in-network with both of her insurance companies [facility] & Medicare)....cef

## 2012-02-12 NOTE — Telephone Encounter (Signed)
02/12/12 referral faxed to [facility]. Emailed and mailed the patient the information for [facility] office and advised the patient that she will need to call their office and schedule her appointment as that is the preferred method of [facility]..Thomasene Lot

## 2012-02-15 ENCOUNTER — Ambulatory Visit (HOSPITAL_BASED_OUTPATIENT_CLINIC_OR_DEPARTMENT_OTHER): Payer: 59 | Attending: Surgery | Admitting: Radiology

## 2012-02-15 ENCOUNTER — Encounter (HOSPITAL_COMMUNITY)
Admission: RE | Disposition: A | Discharge status: Home / Self Care | Payer: Self-pay | Source: Ambulatory Visit | Attending: Surgery

## 2012-02-15 ENCOUNTER — Ambulatory Visit (HOSPITAL_COMMUNITY)
Admission: RE | Admit: 2012-02-15 | Discharge: 2012-02-15 | Disposition: A | Discharge status: Home / Self Care | Payer: 59 | Source: Ambulatory Visit | Attending: Surgery | Admitting: Surgery

## 2012-02-15 VITALS — Ht 63.0 in | Wt 260.0 lb

## 2012-02-15 DIAGNOSIS — G4730 Sleep apnea, unspecified: Secondary | ICD-10-CM

## 2012-02-15 DIAGNOSIS — G4733 Obstructive sleep apnea (adult) (pediatric): Secondary | ICD-10-CM | POA: Insufficient documentation

## 2012-02-15 DIAGNOSIS — Z01818 Encounter for other preprocedural examination: Secondary | ICD-10-CM | POA: Insufficient documentation

## 2012-02-15 DIAGNOSIS — E669 Obesity, unspecified: Secondary | ICD-10-CM

## 2012-02-15 HISTORY — PX: BREATH TEK H PYLORI: SHX5422

## 2012-02-15 HISTORY — DX: Sleep apnea, unspecified: G47.30

## 2012-02-15 SURGERY — BREATH TEST, FOR HELICOBACTER PYLORI

## 2012-02-16 ENCOUNTER — Encounter (HOSPITAL_COMMUNITY): Payer: Self-pay | Admitting: Surgery

## 2012-02-20 DIAGNOSIS — G4733 Obstructive sleep apnea (adult) (pediatric): Secondary | ICD-10-CM

## 2012-02-20 NOTE — Procedures (Signed)
Lori Beard, Lori Beard               ACCOUNT NO.:  [id number]  MEDICAL RECORD NO.:  [id number]          PATIENT TYPE:  OUT  LOCATION:  SLEEP CENTER                 FACILITY:  [facility]  PHYSICIAN:  Nakya Weyand D. Maple Hudson, MD, FCCP, FACPDATE OF BIRTH:  05[birth year]08[birth year]  DATE OF STUDY:  02/15/2012                           NOCTURNAL POLYSOMNOGRAM  REFERRING PHYSICIAN:  Molli Hazard B. Daphine Deutscher, MD  REFERRING DOCTOR:  Thornton Park. Daphine Deutscher, MD  INDICATION FOR STUDY:  Hypersomnia with sleep apnea.  EPWORTH SLEEPINESS SCORE:  9/24.  BMI:  46.1, weight 260 pounds, height 63 inches, neck 15.5 inches.  HOME MEDICATIONS:  Charted and reviewed.  SLEEP ARCHITECTURE:  Total sleep time 259 minutes with sleep efficiency 72%.  Stage I was 17%, stage II 78.6%, stage III 0.2%, REM 4.2% of total sleep time.  Sleep latency 37 minutes, REM latency 180 minutes, awake after sleep onset 31.5 minutes.  Arousal index 13.7.  BEDTIME MEDICATION:  Amitriptyline.  RESPIRATORY DATA:  Apnea-hypopnea index (AHI) 13 per hour.  A total of 56 events was scored including 13 obstructive apneas, 1 central apnea, 1 mixed apnea, 41 hypopneas.  Events were seen in all sleep positions. REM AHI 49.1 per hour.  There were insufficient early events to meet protocol requirements for application of split protocol CPAP titration on this study night.  OXYGEN DATA:  Moderate snoring with oxygen desaturation to a nadir of 58% and mean oxygen saturation through the study of 88.8% on room air. During the total recording time 161.3 minutes was recorded with room air saturation less than 90% and 52.3 minutes was recorded with oxygen saturation less than 88% on room air.  CARDIAC DATA:  Sinus rhythm with occasional PVC.  MOVEMENT/PARASOMNIA:  A few limb jerks were noted with insignificant effect on sleep.  No bathroom trips.  IMPRESSION/RECOMMENDATION: 1. Mild obstructive sleep apnea/hypopnea syndrome, AHI 13 per hour     with nonpositional events.   Moderate snoring and oxygen     desaturation to a nadir of 58% with mean oxygen saturation through     the study of 88.8% on room air. 2. She had insufficient early events to meet protocol requirements for     application of split protocol, CPAP titration on this study night.     Return for dedicated CPAP titration study would be a therapeutic     option if clinically appropriate. 3. Room air oxygen saturation on arrival was 94%.  During the study,     she desaturated to a nadir of 58% with a mean oxygen saturation of     88.8% on room air.  During total recording time, which included     some time awake, she desaturated below 90% for 161.3 minutes and a     low 88% for 52.3 minutes.  If she is not to be treated with CPAP,     then consider evaluation for supplemental home oxygen during sleep.     [name], MD, Tonny Bollman, FACP Diplomate, American Board of Sleep Medicine    CDY/MEDQ  D:  02/20/2012 09:57:48  T:  02/20/2012 10:39:45  Job:  [id number]

## 2012-02-25 ENCOUNTER — Ambulatory Visit (HOSPITAL_COMMUNITY)
Admission: RE | Admit: 2012-02-25 | Discharge: 2012-02-25 | Disposition: A | Discharge status: Home / Self Care | Payer: 59 | Source: Ambulatory Visit | Attending: Surgery | Admitting: Surgery

## 2012-02-25 ENCOUNTER — Ambulatory Visit (HOSPITAL_COMMUNITY): Admission: RE | Admit: 2012-02-25 | Payer: 59 | Source: Ambulatory Visit

## 2012-02-25 ENCOUNTER — Other Ambulatory Visit: Payer: Self-pay

## 2012-02-25 DIAGNOSIS — E669 Obesity, unspecified: Secondary | ICD-10-CM

## 2012-02-25 DIAGNOSIS — K449 Diaphragmatic hernia without obstruction or gangrene: Secondary | ICD-10-CM | POA: Diagnosis not present

## 2012-02-25 DIAGNOSIS — Z13820 Encounter for screening for osteoporosis: Secondary | ICD-10-CM | POA: Insufficient documentation

## 2012-02-25 DIAGNOSIS — Z6841 Body Mass Index (BMI) 40.0 and over, adult: Secondary | ICD-10-CM | POA: Insufficient documentation

## 2012-02-25 DIAGNOSIS — I77819 Aortic ectasia, unspecified site: Secondary | ICD-10-CM | POA: Diagnosis not present

## 2012-02-25 DIAGNOSIS — I517 Cardiomegaly: Secondary | ICD-10-CM | POA: Diagnosis not present

## 2012-02-25 DIAGNOSIS — Z01811 Encounter for preprocedural respiratory examination: Secondary | ICD-10-CM | POA: Diagnosis not present

## 2012-02-25 DIAGNOSIS — I10 Essential (primary) hypertension: Secondary | ICD-10-CM | POA: Insufficient documentation

## 2012-02-25 DIAGNOSIS — K228 Other specified diseases of esophagus: Secondary | ICD-10-CM | POA: Diagnosis not present

## 2012-02-25 DIAGNOSIS — M129 Arthropathy, unspecified: Secondary | ICD-10-CM | POA: Insufficient documentation

## 2012-02-25 DIAGNOSIS — Z780 Asymptomatic menopausal state: Secondary | ICD-10-CM | POA: Diagnosis not present

## 2012-03-02 ENCOUNTER — Other Ambulatory Visit: Payer: Self-pay | Admitting: Family Medicine

## 2012-03-03 ENCOUNTER — Ambulatory Visit: Payer: Self-pay | Admitting: *Deleted

## 2012-03-10 ENCOUNTER — Other Ambulatory Visit: Payer: Self-pay | Admitting: Family Medicine

## 2012-03-10 MED ORDER — AMITRIPTYLINE HCL 25 MG PO TABS: 75.0000 mg | ORAL_TABLET | Freq: Every day | ORAL | Status: DC

## 2012-04-02 ENCOUNTER — Other Ambulatory Visit: Payer: Self-pay | Admitting: *Deleted

## 2012-04-02 MED ORDER — NEBIVOLOL HCL 10 MG PO TABS: 10.0000 mg | ORAL_TABLET | Freq: Every day | ORAL | Status: DC

## 2012-04-12 ENCOUNTER — Other Ambulatory Visit: Payer: Self-pay | Admitting: Family Medicine

## 2012-04-12 MED ORDER — AMITRIPTYLINE HCL 25 MG PO TABS: 75.0000 mg | ORAL_TABLET | Freq: Every day | ORAL | Status: DC

## 2012-05-31 ENCOUNTER — Other Ambulatory Visit: Payer: Self-pay | Admitting: Physician Assistant

## 2012-08-01 DIAGNOSIS — E669 Obesity, unspecified: Secondary | ICD-10-CM | POA: Diagnosis not present

## 2012-11-08 ENCOUNTER — Encounter (HOSPITAL_COMMUNITY): Payer: Self-pay | Admitting: Pharmacy Technician

## 2012-11-10 ENCOUNTER — Other Ambulatory Visit (INDEPENDENT_AMBULATORY_CARE_PROVIDER_SITE_OTHER): Payer: Self-pay | Admitting: Surgery

## 2012-11-10 ENCOUNTER — Encounter (INDEPENDENT_AMBULATORY_CARE_PROVIDER_SITE_OTHER): Payer: Self-pay | Admitting: Surgery

## 2012-11-10 ENCOUNTER — Ambulatory Visit (INDEPENDENT_AMBULATORY_CARE_PROVIDER_SITE_OTHER): Payer: Commercial Managed Care - PPO | Admitting: Surgery

## 2012-11-10 ENCOUNTER — Encounter: Payer: 59 | Attending: Surgery | Admitting: *Deleted

## 2012-11-10 VITALS — BP 112/70 | HR 70 | Temp 97.6°F | Resp 16 | Ht 64.0 in | Wt 257.6 lb

## 2012-11-10 DIAGNOSIS — M129 Arthropathy, unspecified: Secondary | ICD-10-CM | POA: Diagnosis not present

## 2012-11-10 DIAGNOSIS — M1990 Unspecified osteoarthritis, unspecified site: Secondary | ICD-10-CM

## 2012-11-10 NOTE — Patient Instructions (Addendum)

## 2012-11-10 NOTE — Patient Instructions (Signed)
Follow:   Pre-Op Diet per MD 2 weeks prior to surgery  Phase 2- Liquids (clear/full) 2 weeks after surgery  Vitamin/Mineral/Calcium guidelines for purchasing bariatric supplements  Exercise guidelines pre and post-op per MD  Follow-up at [facility] in 2 weeks post-op for diet advancement. Contact Cosme Jacob as needed with questions/concerns. 

## 2012-11-10 NOTE — Progress Notes (Signed)
Chief Complaint:  Morbid obesity BMI 44  History of Present Illness:  Lori Beard is an 62 y.o. female who was last seen in May 2013 for lapband surgery.  Her UGI shows a small hiatus hernia.  She has had a lap cholecystectomy.  She is ready for her lapband surgery and has no other questions  Past Medical History  Diagnosis Date  . Arthritis   . Hypertension   . Morbid obesity     Past Surgical History  Procedure Laterality Date  . Abdominal hysterectomy    . Knee arthroscopy    . Cholecystectomy    . Hip surgery      bilateral  . Breath tek h pylori  02/15/2012    Procedure: BREATH TEK H PYLORI;  Surgeon: Valarie Merino, MD;  Location: [facility];  Service: General;  Laterality: N/A;  745    Current Outpatient Prescriptions  Medication Sig Dispense Refill  . amitriptyline (ELAVIL) 25 MG tablet Take 3 tablets (75 mg total) by mouth at bedtime. NEEDS OFFICE VISIT- 3rd notice  45 tablet  0  . aspirin 325 MG tablet Take 325 mg by mouth every morning.       [name] CRANBERRY PO Take by mouth daily.      . furosemide (LASIX) 40 MG tablet Take 40 mg by mouth daily.      . hydrocodone-acetaminophen (LORCET PLUS) 7.5-650 MG per tablet Take 1 tablet by mouth every 6 (six) hours as needed.      . nebivolol (BYSTOLIC) 5 MG tablet Take 5 mg by mouth every morning.      . pantoprazole (PROTONIX) 40 MG tablet Take 40 mg by mouth daily.      . verapamil (CALAN-SR) 240 MG CR tablet Take 240 mg by mouth every morning.       Current Facility-Administered Medications  Medication Dose Route Frequency Provider Last Rate Last Dose  . triamcinolone acetonide (KENALOG-40) injection 80 mg  80 mg Intra-articular Once Otho Darner, MD       Ace inhibitors and Morphine and related Family History  Problem Relation Age of Onset  . Kidney disease Mother   . Heart disease Father   . Cancer Father     colon  . Cancer Sister     breast   Social History:   reports that she has never smoked. She  does not have any smokeless tobacco history on file. She reports that she does not drink alcohol or use illicit drugs.   REVIEW OF SYSTEMS - PERTINENT POSITIVES ONLY: Neg for DVT  Physical Exam:   Blood pressure 112/70, pulse 70, temperature 97.6 F (36.4 C), temperature source Temporal, resp. rate 16, height 5\' 4"  (1.626 m), weight 257 lb 9.6 oz (116.847 kg). Body mass index is 44.2 kg/(m^2).  Gen:  WDWN AAF NAD  Neurological: Alert and oriented to person, place, and time. Motor and sensory function is grossly intact  Head: Normocephalic and atraumatic.  Eyes: Conjunctivae are normal. Pupils are equal, round, and reactive to light. No scleral icterus.  Neck: Normal range of motion. Neck supple. No tracheal deviation or thyromegaly present.  Cardiovascular:  SR without murmurs or gallops.  No carotid bruits Respiratory: Effort normal.  No respiratory distress. No chest wall tenderness. Breath sounds normal.  No wheezes, rales or rhonchi.  Abdomen:  nontender GU: Musculoskeletal: Normal range of motion. Extremities are nontender. No cyanosis, edema or clubbing noted Lymphadenopathy: No cervical, preauricular, postauricular or axillary adenopathy is present  Skin: Skin is warm and dry. No rash noted. No diaphoresis. No erythema. No pallor. Pscyh: Normal mood and affect. Behavior is normal. Judgment and thought content normal.   LABORATORY RESULTS: No results found for this or any previous visit (from the past 48 hour(s)).  RADIOLOGY RESULTS: No results found.  Problem List: Patient Active Problem List  Diagnosis  . Obesity    Assessment & Plan: Morbid obesity BMI 44 for lapband.  Primary care is Lupe Carney.  Has small hh and ? History of GER    Matt B. Daphine Deutscher, MD, [facility] [phone] beeper [phone]  11/10/2012 12:06 PM

## 2012-11-10 NOTE — Progress Notes (Signed)
Bariatric Class:  Appt start time: 1730 end time:  1830.  Pre-Operative Nutrition Class  Patient was seen on 11/10/2012 for Pre-Operative Bariatric Surgery Education at [facility].   Surgery date: 11/22/12 Surgery type: LAGB Start weight at [facility]: 256.5 lbs (02/05/12)  Weight today: 257.3 lbs Weight change: n/a Total weight lost: n/a BMI: 44.9  Samples given per MNT protocol: Bariatric Advantage Multivitamin Lot # [id number] Exp:06/15  Bariatric Advantage Sublingual B12 Lot # [id number] Exp:10/15  Celebrate Vitamins Multivitamin Lot # [id number] Exp:06/15  Celebrate Vitamins Calcium Citrate Lot # [id number] Exp:08/15  Premier Protein Shake Lot # [id number] Exp: 07/15/13  Unjury Protein Powder Lot # [id number] Exp: 03/15  The following the learning objective met by the patient during this course:  Identifies Pre-Op Dietary Goals and will begin 2 weeks pre-operatively  Identifies appropriate sources of fluids and proteins   States protein recommendations and appropriate sources pre and post-operatively  Identifies Post-Operative Dietary Goals and will follow for 2 weeks post-operatively  Identifies appropriate multivitamin and calcium sources  Describes the need for physical activity post-operatively and will follow MD recommendations  States when to call healthcare provider regarding medication questions or post-operative complications  Handouts given during class include:  Pre-Op Bariatric Surgery Diet Handout  Protein Shake Handout  Post-Op Bariatric Surgery Nutrition Handout  [facility] Program Information Flyer  Support Group Information Flyer  [facility] Bariatric Supplements Price List  Follow-Up Plan: Patient will follow-up at [facility] 2 weeks post operatively for diet advancement per MD.

## 2012-11-15 ENCOUNTER — Encounter (HOSPITAL_COMMUNITY): Payer: Self-pay

## 2012-11-15 ENCOUNTER — Encounter (HOSPITAL_COMMUNITY)
Admission: RE | Admit: 2012-11-15 | Discharge: 2012-11-15 | Disposition: A | Discharge status: Home / Self Care | Payer: 59 | Source: Ambulatory Visit | Attending: Surgery | Admitting: Surgery

## 2012-11-15 DIAGNOSIS — Z6841 Body Mass Index (BMI) 40.0 and over, adult: Secondary | ICD-10-CM | POA: Diagnosis not present

## 2012-11-15 DIAGNOSIS — Z7982 Long term (current) use of aspirin: Secondary | ICD-10-CM | POA: Diagnosis not present

## 2012-11-15 DIAGNOSIS — I10 Essential (primary) hypertension: Secondary | ICD-10-CM | POA: Diagnosis not present

## 2012-11-15 DIAGNOSIS — Z01812 Encounter for preprocedural laboratory examination: Secondary | ICD-10-CM | POA: Diagnosis not present

## 2012-11-15 DIAGNOSIS — G4733 Obstructive sleep apnea (adult) (pediatric): Secondary | ICD-10-CM | POA: Diagnosis not present

## 2012-11-15 DIAGNOSIS — K449 Diaphragmatic hernia without obstruction or gangrene: Secondary | ICD-10-CM | POA: Diagnosis not present

## 2012-11-15 DIAGNOSIS — Z9089 Acquired absence of other organs: Secondary | ICD-10-CM | POA: Diagnosis not present

## 2012-11-15 DIAGNOSIS — Z79899 Other long term (current) drug therapy: Secondary | ICD-10-CM | POA: Diagnosis not present

## 2012-11-15 HISTORY — DX: Gastro-esophageal reflux disease without esophagitis: K21.9

## 2012-11-15 HISTORY — DX: Sleep apnea, unspecified: G47.30

## 2012-11-15 LAB — COMPREHENSIVE METABOLIC PANEL
ALT: 7 U/L (ref 0–35)
Alkaline Phosphatase: 124 U/L — ABNORMAL HIGH (ref 39–117)
BUN: 16 mg/dL (ref 6–23)
CO2: 30 mEq/L (ref 19–32)
Chloride: 99 mEq/L (ref 96–112)
GFR calc Af Amer: 76 mL/min — ABNORMAL LOW (ref >90)
GFR calc non Af Amer: 65 mL/min — ABNORMAL LOW (ref >90)
Glucose, Bld: 107 mg/dL — ABNORMAL HIGH (ref 70–99)
Potassium: 3.7 mEq/L (ref 3.5–5.1)
Sodium: 137 mEq/L (ref 135–145)
Total Bilirubin: 0.2 mg/dL — ABNORMAL LOW (ref 0.3–1.2)
Total Protein: 8.5 g/dL — ABNORMAL HIGH (ref 6.0–8.3)

## 2012-11-15 LAB — CBC WITH DIFFERENTIAL/PLATELET
Hemoglobin: 12.1 g/dL (ref 12.0–15.0)
Lymphocytes Relative: 36 % (ref 12–46)
Lymphs Abs: 3.2 10*3/uL (ref 0.7–4.0)
Monocytes Relative: 5 % (ref 3–12)
Neutrophils Relative %: 57 % (ref 43–77)
Platelets: 255 10*3/uL (ref 150–400)
RBC: 4.69 MIL/uL (ref 3.87–5.11)
WBC: 8.9 10*3/uL (ref 4.0–10.5)

## 2012-11-15 LAB — SURGICAL PCR SCREEN: MRSA, PCR: NEGATIVE

## 2012-11-15 NOTE — Patient Instructions (Signed)
YOUR SURGERY IS SCHEDULED AT [facility]  ON:   Tuesday  3/18  REPORT TO [facility] AT: 9:45 AM      PHONE # FOR SHORT STAY IS [phone]  DO NOT EAT OR DRINK ANYTHING AFTER MIDNIGHT THE NIGHT BEFORE YOUR SURGERY.  YOU MAY BRUSH YOUR TEETH, RINSE OUT YOUR MOUTH--BUT NO WATER, NO FOOD, NO CHEWING GUM, NO MINTS, NO CANDIES, NO CHEWING TOBACCO.  PLEASE TAKE THE FOLLOWING MEDICATIONS THE AM OF YOUR SURGERY WITH A FEW SIPS OF WATER:  BYSTOLIC, PANTOPRAZOLE, VERAPAMIL  IF YOU USE INHALERS--USE YOUR INHALERS THE AM OF YOUR SURGERY AND BRING INHALERS TO THE HOSPITAL.    IF YOU ARE DIABETIC:  DO NOT TAKE ANY DIABETIC MEDICATIONS THE AM OF YOUR SURGERY.  IF YOU TAKE INSULIN IN THE EVENINGS--PLEASE ONLY TAKE 1/2 NORMAL EVENING DOSE THE NIGHT BEFORE YOUR SURGERY.  NO INSULIN THE AM OF YOUR SURGERY.  IF YOU HAVE SLEEP APNEA AND USE CPAP OR BIPAP--PLEASE BRING THE MASK AND THE TUBING.  DO NOT BRING YOUR MACHINE.  DO NOT BRING VALUABLES, MONEY, CREDIT CARDS.  DO NOT WEAR JEWELRY, MAKE-UP, NAIL POLISH AND NO METAL PINS OR CLIPS IN YOUR HAIR. CONTACT LENS, DENTURES / PARTIALS, GLASSES SHOULD NOT BE WORN TO SURGERY AND IN MOST CASES-HEARING AIDS WILL NEED TO BE REMOVED.  BRING YOUR GLASSES CASE, ANY EQUIPMENT NEEDED FOR YOUR CONTACT LENS. FOR PATIENTS ADMITTED TO THE HOSPITAL--CHECK OUT TIME THE DAY OF DISCHARGE IS 11:00 AM.  ALL INPATIENT ROOMS ARE PRIVATE - WITH BATHROOM, TELEPHONE, TELEVISION AND WIFI INTERNET.                             PLEASE READ OVER ANY  FACT SHEETS THAT YOU WERE GIVEN: MRSA INFORMATION, BLOOD TRANSFUSION INFORMATION, INCENTIVE SPIROMETER INFORMATION. FAILURE TO FOLLOW THESE INSTRUCTIONS MAY RESULT IN THE CANCELLATION OF YOUR SURGERY.   PATIENT SIGNATURE_________________________________

## 2012-11-15 NOTE — Pre-Procedure Instructions (Signed)
EKG AND CXR REPORTS IN EPIC FROM 02/25/12. PT HAS HAD HYSTERECTOMY - PREOP ORDER FROM DR. MARTIN TO DO PREGNANCY TEST DISCONTINUED.

## 2012-11-22 ENCOUNTER — Encounter (HOSPITAL_COMMUNITY): Payer: Self-pay | Admitting: Anesthesiology

## 2012-11-22 ENCOUNTER — Encounter (HOSPITAL_COMMUNITY)
Admission: RE | Disposition: A | Discharge status: Home / Self Care | Payer: Self-pay | Source: Ambulatory Visit | Attending: Surgery

## 2012-11-22 ENCOUNTER — Ambulatory Visit (HOSPITAL_COMMUNITY): Payer: 59 | Admitting: Anesthesiology

## 2012-11-22 ENCOUNTER — Ambulatory Visit (HOSPITAL_COMMUNITY)
Admission: RE | Admit: 2012-11-22 | Discharge: 2012-11-23 | Disposition: A | Discharge status: Home / Self Care | Payer: 59 | Source: Ambulatory Visit | Attending: Surgery | Admitting: Surgery

## 2012-11-22 ENCOUNTER — Encounter (HOSPITAL_COMMUNITY): Payer: Self-pay | Admitting: *Deleted

## 2012-11-22 DIAGNOSIS — K449 Diaphragmatic hernia without obstruction or gangrene: Secondary | ICD-10-CM | POA: Diagnosis not present

## 2012-11-22 DIAGNOSIS — Z9089 Acquired absence of other organs: Secondary | ICD-10-CM | POA: Insufficient documentation

## 2012-11-22 DIAGNOSIS — E669 Obesity, unspecified: Secondary | ICD-10-CM

## 2012-11-22 DIAGNOSIS — Z79899 Other long term (current) drug therapy: Secondary | ICD-10-CM | POA: Insufficient documentation

## 2012-11-22 DIAGNOSIS — Z01812 Encounter for preprocedural laboratory examination: Secondary | ICD-10-CM | POA: Insufficient documentation

## 2012-11-22 DIAGNOSIS — I10 Essential (primary) hypertension: Secondary | ICD-10-CM | POA: Diagnosis not present

## 2012-11-22 DIAGNOSIS — G4733 Obstructive sleep apnea (adult) (pediatric): Secondary | ICD-10-CM | POA: Insufficient documentation

## 2012-11-22 DIAGNOSIS — M129 Arthropathy, unspecified: Secondary | ICD-10-CM

## 2012-11-22 DIAGNOSIS — Z6841 Body Mass Index (BMI) 40.0 and over, adult: Secondary | ICD-10-CM | POA: Insufficient documentation

## 2012-11-22 DIAGNOSIS — Z9884 Bariatric surgery status: Secondary | ICD-10-CM

## 2012-11-22 DIAGNOSIS — Z7982 Long term (current) use of aspirin: Secondary | ICD-10-CM | POA: Insufficient documentation

## 2012-11-22 HISTORY — PX: MESH APPLIED TO LAP PORT: SHX5969

## 2012-11-22 LAB — CBC
MCV: 84.9 fL (ref 78.0–100.0)
RDW: 15 % (ref 11.5–15.5)
WBC: 10.9 10*3/uL — ABNORMAL HIGH (ref 4.0–10.5)

## 2012-11-22 LAB — CREATININE, SERUM: GFR calc non Af Amer: 60 mL/min — ABNORMAL LOW (ref >90)

## 2012-11-22 SURGERY — GASTRIC BANDING, LAPAROSCOPIC, WITH HIATAL HERNIA REPAIR
Anesthesia: General | Site: Abdomen | Wound class: Clean

## 2012-11-22 MED ORDER — BUPIVACAINE LIPOSOME 1.3 % IJ SUSP
INTRAMUSCULAR | Status: DC | PRN
  Administered 2012-11-22: 13:00:00

## 2012-11-22 MED ORDER — MIDAZOLAM HCL 5 MG/5ML IJ SOLN
INTRAMUSCULAR | Status: DC | PRN
  Administered 2012-11-22: 2 mg via INTRAVENOUS

## 2012-11-22 MED ORDER — SODIUM CHLORIDE 0.9 % IV SOLN
INTRAVENOUS | Status: DC | PRN
  Administered 2012-11-22: 20 mL

## 2012-11-22 MED ORDER — ATROPINE SULFATE 0.4 MG/ML IJ SOLN
INTRAMUSCULAR | Status: DC | PRN
  Administered 2012-11-22: 0.4 mg via INTRAVENOUS

## 2012-11-22 MED ORDER — KCL IN DEXTROSE-NACL 20-5-0.45 MEQ/L-%-% IV SOLN
INTRAVENOUS | Status: DC
  Administered 2012-11-22 – 2012-11-23 (×2): via INTRAVENOUS
  Filled 2012-11-22 (×4): qty 1000

## 2012-11-22 MED ORDER — UNJURY CHOCOLATE CLASSIC POWDER
2.0000 [oz_av] | Freq: Four times a day (QID) | ORAL | Status: DC
  Administered 2012-11-23: 2 [oz_av] via ORAL
  Filled 2012-11-22 (×4): qty 27

## 2012-11-22 MED ORDER — BUPIVACAINE LIPOSOME 1.3 % IJ SUSP
20.0000 mL | Freq: Once | INTRAMUSCULAR | Status: DC
  Filled 2012-11-22: qty 20

## 2012-11-22 MED ORDER — FENTANYL CITRATE 0.05 MG/ML IJ SOLN
INTRAMUSCULAR | Status: DC | PRN
  Administered 2012-11-22: 100 ug via INTRAVENOUS

## 2012-11-22 MED ORDER — SUCCINYLCHOLINE CHLORIDE 20 MG/ML IJ SOLN
INTRAMUSCULAR | Status: DC | PRN
  Administered 2012-11-22: 100 mg via INTRAVENOUS

## 2012-11-22 MED ORDER — GLYCOPYRROLATE 0.2 MG/ML IJ SOLN
INTRAMUSCULAR | Status: DC | PRN
  Administered 2012-11-22: .5 mg via INTRAVENOUS

## 2012-11-22 MED ORDER — DEXAMETHASONE SODIUM PHOSPHATE 10 MG/ML IJ SOLN
INTRAMUSCULAR | Status: DC | PRN
  Administered 2012-11-22: 10 mg via INTRAVENOUS

## 2012-11-22 MED ORDER — ROCURONIUM BROMIDE 100 MG/10ML IV SOLN
INTRAVENOUS | Status: DC | PRN
  Administered 2012-11-22: 30 mg via INTRAVENOUS
  Administered 2012-11-22: 10 mg via INTRAVENOUS

## 2012-11-22 MED ORDER — LACTATED RINGERS IV SOLN
INTRAVENOUS | Status: DC
  Administered 2012-11-22: 1000 mL via INTRAVENOUS
  Administered 2012-11-22: 12:00:00 via INTRAVENOUS

## 2012-11-22 MED ORDER — LACTATED RINGERS IV SOLN: INTRAVENOUS | Status: DC

## 2012-11-22 MED ORDER — EPHEDRINE SULFATE 50 MG/ML IJ SOLN
INTRAMUSCULAR | Status: DC | PRN
  Administered 2012-11-22 (×2): 5 mg via INTRAVENOUS

## 2012-11-22 MED ORDER — HYDROMORPHONE HCL PF 1 MG/ML IJ SOLN
0.5000 mg | INTRAMUSCULAR | Status: DC | PRN
  Administered 2012-11-22 – 2012-11-23 (×3): 0.5 mg via INTRAVENOUS
  Filled 2012-11-22 (×3): qty 1

## 2012-11-22 MED ORDER — PHENYLEPHRINE HCL 10 MG/ML IJ SOLN
INTRAMUSCULAR | Status: DC | PRN
  Administered 2012-11-22 (×2): 80 ug via INTRAVENOUS

## 2012-11-22 MED ORDER — HEPARIN SODIUM (PORCINE) 5000 UNIT/ML IJ SOLN
5000.0000 [IU] | INTRAMUSCULAR | Status: AC
  Administered 2012-11-22: 5000 [IU] via SUBCUTANEOUS
  Filled 2012-11-22: qty 1

## 2012-11-22 MED ORDER — VERAPAMIL HCL ER 240 MG PO TBCR
240.0000 mg | EXTENDED_RELEASE_TABLET | Freq: Every morning | ORAL | Status: DC
  Administered 2012-11-23: 240 mg via ORAL
  Filled 2012-11-22 (×2): qty 1

## 2012-11-22 MED ORDER — UNJURY CHICKEN SOUP POWDER
2.0000 [oz_av] | Freq: Four times a day (QID) | ORAL | Status: DC
  Filled 2012-11-22 (×4): qty 27

## 2012-11-22 MED ORDER — HEPARIN SODIUM (PORCINE) 5000 UNIT/ML IJ SOLN
5000.0000 [IU] | Freq: Three times a day (TID) | INTRAMUSCULAR | Status: DC
  Administered 2012-11-22 – 2012-11-23 (×3): 5000 [IU] via SUBCUTANEOUS
  Filled 2012-11-22 (×6): qty 1

## 2012-11-22 MED ORDER — ONDANSETRON HCL 4 MG/2ML IJ SOLN
4.0000 mg | INTRAMUSCULAR | Status: DC | PRN
  Administered 2012-11-22: 4 mg via INTRAVENOUS
  Filled 2012-11-22: qty 2

## 2012-11-22 MED ORDER — 0.9 % SODIUM CHLORIDE (POUR BTL) OPTIME
TOPICAL | Status: DC | PRN
  Administered 2012-11-22: 1000 mL

## 2012-11-22 MED ORDER — PROPOFOL 10 MG/ML IV BOLUS
INTRAVENOUS | Status: DC | PRN
  Administered 2012-11-22: 120 mg via INTRAVENOUS

## 2012-11-22 MED ORDER — ACETAMINOPHEN 160 MG/5ML PO SOLN
650.0000 mg | ORAL | Status: DC | PRN
  Filled 2012-11-22: qty 20.3

## 2012-11-22 MED ORDER — NEOSTIGMINE METHYLSULFATE 1 MG/ML IJ SOLN
INTRAMUSCULAR | Status: DC | PRN
  Administered 2012-11-22: 4 mg via INTRAVENOUS

## 2012-11-22 MED ORDER — DEXTROSE 5 % IV SOLN
2.0000 g | INTRAVENOUS | Status: AC
  Administered 2012-11-22: 2 g via INTRAVENOUS
  Filled 2012-11-22: qty 2

## 2012-11-22 MED ORDER — HYDROMORPHONE HCL PF 1 MG/ML IJ SOLN: 0.2500 mg | INTRAMUSCULAR | Status: DC | PRN

## 2012-11-22 MED ORDER — UNJURY VANILLA POWDER
2.0000 [oz_av] | Freq: Four times a day (QID) | ORAL | Status: DC
  Filled 2012-11-22 (×4): qty 27

## 2012-11-22 MED ORDER — OXYCODONE-ACETAMINOPHEN 5-325 MG/5ML PO SOLN
5.0000 mL | ORAL | Status: DC | PRN
  Administered 2012-11-23: 10 mL via ORAL
  Filled 2012-11-22: qty 10

## 2012-11-22 MED ORDER — ONDANSETRON HCL 4 MG/2ML IJ SOLN
INTRAMUSCULAR | Status: DC | PRN
  Administered 2012-11-22: 4 mg via INTRAVENOUS

## 2012-11-22 SURGICAL SUPPLY — 67 items
BAND LAP 10.0 W/TUBES (Band) ×3 IMPLANT
BENZOIN TINCTURE PRP APPL 2/3 (GAUZE/BANDAGES/DRESSINGS) IMPLANT
BLADE HEX COATED 2.75 (ELECTRODE) ×3 IMPLANT
BLADE SURG 15 STRL LF DISP TIS (BLADE) ×2 IMPLANT
BLADE SURG 15 STRL SS (BLADE) ×1
CANISTER SUCTION 2500CC (MISCELLANEOUS) ×3 IMPLANT
CLOTH BEACON ORANGE TIMEOUT ST (SAFETY) ×3 IMPLANT
COVER SURGICAL LIGHT HANDLE (MISCELLANEOUS) ×3 IMPLANT
DECANTER SPIKE VIAL GLASS SM (MISCELLANEOUS) ×6 IMPLANT
DERMABOND ADVANCED (GAUZE/BANDAGES/DRESSINGS) ×1
DERMABOND ADVANCED .7 DNX12 (GAUZE/BANDAGES/DRESSINGS) ×2 IMPLANT
DEVICE SUT QUICK LOAD TK 5 (STAPLE) ×15 IMPLANT
DEVICE SUT TI-KNOT TK 5X26 (MISCELLANEOUS) ×3 IMPLANT
DEVICE SUTURE ENDOST 10MM (ENDOMECHANICALS) ×3 IMPLANT
DISSECTOR BLUNT TIP ENDO 5MM (MISCELLANEOUS) ×3 IMPLANT
DRAPE CAMERA CLOSED 9X96 (DRAPES) ×3 IMPLANT
ELECT REM PT RETURN 9FT ADLT (ELECTROSURGICAL) ×3
ELECTRODE REM PT RTRN 9FT ADLT (ELECTROSURGICAL) ×2 IMPLANT
GLOVE BIOGEL M 8.0 STRL (GLOVE) ×3 IMPLANT
GLOVE BIOGEL PI IND STRL 7.0 (GLOVE) ×2 IMPLANT
GLOVE BIOGEL PI IND STRL 7.5 (GLOVE) ×2 IMPLANT
GLOVE BIOGEL PI INDICATOR 7.0 (GLOVE) ×1
GLOVE BIOGEL PI INDICATOR 7.5 (GLOVE) ×1
GLOVE SS BIOGEL STRL SZ 7.5 (GLOVE) ×2 IMPLANT
GLOVE SUPERSENSE BIOGEL SZ 7.5 (GLOVE) ×1
GLOVE SURG SS PI 7.0 STRL IVOR (GLOVE) ×6 IMPLANT
GOWN STRL NON-REIN LRG LVL3 (GOWN DISPOSABLE) ×3 IMPLANT
GOWN STRL REIN XL XLG (GOWN DISPOSABLE) ×9 IMPLANT
HOVERMATT SINGLE USE (MISCELLANEOUS) ×3 IMPLANT
KIT BASIN OR (CUSTOM PROCEDURE TRAY) ×3 IMPLANT
MESH HERNIA 1X4 RECT BARD (Mesh General) ×2 IMPLANT
MESH HERNIA BARD 1X4 (Mesh General) ×1 IMPLANT
NEEDLE SPNL 22GX3.5 QUINCKE BK (NEEDLE) ×3 IMPLANT
NS IRRIG 1000ML POUR BTL (IV SOLUTION) ×3 IMPLANT
PACK UNIVERSAL I (CUSTOM PROCEDURE TRAY) ×3 IMPLANT
PENCIL BUTTON HOLSTER BLD 10FT (ELECTRODE) ×3 IMPLANT
SCALPEL HARMONIC ACE (MISCELLANEOUS) IMPLANT
SET IRRIG TUBING LAPAROSCOPIC (IRRIGATION / IRRIGATOR) IMPLANT
SHEARS CURVED HARMONIC AC 45CM (MISCELLANEOUS) IMPLANT
SLEEVE ADV FIXATION 5X100MM (TROCAR) IMPLANT
SLEEVE Z-THREAD 5X100MM (TROCAR) IMPLANT
SOLUTION ANTI FOG 6CC (MISCELLANEOUS) ×3 IMPLANT
SPONGE GAUZE 4X4 12PLY (GAUZE/BANDAGES/DRESSINGS) ×3 IMPLANT
SPONGE LAP 18X18 X RAY DECT (DISPOSABLE) ×3 IMPLANT
STAPLER VISISTAT 35W (STAPLE) ×3 IMPLANT
STRIP CLOSURE SKIN 1/2X4 (GAUZE/BANDAGES/DRESSINGS) IMPLANT
SUT ETHIBOND 2 0 SH (SUTURE) ×3
SUT ETHIBOND 2 0 SH 36X2 (SUTURE) ×6 IMPLANT
SUT PROLENE 2 0 CT2 30 (SUTURE) ×3 IMPLANT
SUT SILK 0 (SUTURE)
SUT SILK 0 30XBRD TIE 6 (SUTURE) IMPLANT
SUT SURGIDAC NAB ES-9 0 48 120 (SUTURE) ×6 IMPLANT
SUT VIC AB 2-0 SH 27 (SUTURE)
SUT VIC AB 2-0 SH 27X BRD (SUTURE) IMPLANT
SUT VIC AB 4-0 SH 18 (SUTURE) ×3 IMPLANT
SYR 20CC LL (SYRINGE) ×3 IMPLANT
SYR 30ML LL (SYRINGE) ×3 IMPLANT
SYS KII OPTICAL ACCESS 15MM (TROCAR) ×3
SYSTEM KII OPTICAL ACCESS 15MM (TROCAR) ×2 IMPLANT
TOWEL OR 17X26 10 PK STRL BLUE (TOWEL DISPOSABLE) ×6 IMPLANT
TROCAR ADV FIXATION 11X100MM (TROCAR) ×6 IMPLANT
TROCAR XCEL NON-BLD 11X100MML (ENDOMECHANICALS) ×3 IMPLANT
TROCAR Z-THREAD FIOS 11X100 BL (TROCAR) IMPLANT
TROCAR Z-THREAD FIOS 5X100MM (TROCAR) ×3 IMPLANT
TROCAR Z-THREAD SLEEVE 11X100 (TROCAR) IMPLANT
TUBE CALIBRATION LAPBAND (TUBING) ×3 IMPLANT
TUBING INSUFFLATION 10FT LAP (TUBING) ×3 IMPLANT

## 2012-11-22 NOTE — Interval H&P Note (Signed)
History and Physical Interval Note:  11/22/2012 10:57 AM  Lori Beard  has presented today for surgery, with the diagnosis of morbid obesity  The various methods of treatment have been discussed with the patient and family. After consideration of risks, benefits and other options for treatment, the patient has consented to  Procedure(s): LAPAROSCOPIC GASTRIC BANDING (N/A) as a surgical intervention .  The patient's history has been reviewed, patient examined, no change in status, stable for surgery.  I have reviewed the patient's chart and labs.  Questions were answered to the patient's satisfaction.     Kristilyn Coltrane B

## 2012-11-22 NOTE — Anesthesia Postprocedure Evaluation (Signed)
  Anesthesia Post-op Note  Patient: Lori Beard  Procedure(s) Performed: Procedure(s) (LRB): MESH APPLIED TO LAP PORT (N/A) LAPAROSCOPIC GASTRIC BANDING WITH HIATAL HERNIA REPAIR (N/A)  Patient Location: PACU  Anesthesia Type: General  Level of Consciousness: awake and alert   Airway and Oxygen Therapy: Patient Spontanous Breathing  Post-op Pain: mild  Post-op Assessment: Post-op Vital signs reviewed, Patient's Cardiovascular Status Stable, Respiratory Function Stable, Patent Airway and No signs of Nausea or vomiting  Last Vitals:  Filed Vitals:   11/22/12 1400  BP: 123/68  Pulse: 60  Temp:   Resp: 16    Post-op Vital Signs: stable   Complications: No apparent anesthesia complications

## 2012-11-22 NOTE — Op Note (Signed)
11/22/2012  Surgeon: Wenda Low, MD, FACS Asst:  Jaclynn Guarneri, MD, FACS  Procedure: Laparoscopic adjustable gastric banding with APS and 2 suture posterior repair of hiatus hernia  Anes:  General  EBL:  Minimal  Description of Procedure  The patient was taken to OR # 1 and given general anesthesia.  After a prep with PCMX the patient was draped and a timeout performed.  Access to the abdomen was achieved with a 5 mm 0 degree Optiview technique through the left upper quadrant.    Adhesions were in the lower midline and were not disturbed.  Ports were placed to the the right of the midline including a 15 trocar in  the right upper quadrant placed obliquely.  The Satira Mccallum was used to retract the left lateral segment and the peritoneum was incised along the left crus.   The EJ junction as assessed for a hiatus hernia and a prominent dimple was seen.  A balloon test was performed after the repair and showed the repair to be good.  Posterior dissection was performed and revealed good exposure of the right and left crura.  Two sutures were placed with the endostitch and secured with Ty Knots.    The pars flaccida technique was utilized to insert the blunt "finger " dissector from right to left behind the stomach.  This created a target zone to pass the band passer.     The lapband APS   Had been previously flushed and was inserted through the 15 trocar.  It was placed in the tip of "the finger"  and pulled around behind the stomach.   The band was plicated with 3 sutures of 2-0 Ethibond with free needle and secured with Ty Knot.  The tubing was brought out through the lower incision on the right and connected to the port which had mesh sewn onto the back and was placed into the subcutaneous pocket.  The incisions were injected with Exparel and closed with 4-0 vicryl and Dermabond.     The patient was taken to the PACU in stable condition.    Matt B. Daphine Deutscher, MD, [facility],  [place] [phone]

## 2012-11-22 NOTE — Anesthesia Preprocedure Evaluation (Addendum)
Anesthesia Evaluation  Patient identified by MRN, date of birth, ID band Patient awake    Reviewed: Allergy & Precautions, H&P , NPO status , Patient's Chart, lab work & pertinent test results, reviewed documented beta blocker date and time   Airway Mallampati: III TM Distance: >3 FB Neck ROM: full    Dental  (+) Missing and Dental Advisory Given Missing back teeth:   Pulmonary shortness of breath and with exertion, sleep apnea ,  Mild OSA.  No CPAP breath sounds clear to auscultation  Pulmonary exam normal       Cardiovascular Exercise Tolerance: Good hypertension, Pt. on home beta blockers Rhythm:regular Rate:Normal     Neuro/Psych negative neurological ROS  negative psych ROS   GI/Hepatic negative GI ROS, Neg liver ROS, Medicated and Controlled,  Endo/Other  negative endocrine ROSMorbid obesity  Renal/GU negative Renal ROS  negative genitourinary   Musculoskeletal   Abdominal (+) + obese,   Peds  Hematology negative hematology ROS (+)   Anesthesia Other Findings   Reproductive/Obstetrics negative OB ROS                          Anesthesia Physical Anesthesia Plan  ASA: III  Anesthesia Plan: General   Post-op Pain Management:    Induction: Intravenous  Airway Management Planned: Oral ETT  Additional Equipment:   Intra-op Plan:   Post-operative Plan: Extubation in OR  Informed Consent: I have reviewed the patients History and Physical, chart, labs and discussed the procedure including the risks, benefits and alternatives for the proposed anesthesia with the patient or authorized representative who has indicated his/her understanding and acceptance.   Dental Advisory Given  Plan Discussed with: CRNA and Surgeon  Anesthesia Plan Comments:         Anesthesia Quick Evaluation

## 2012-11-22 NOTE — H&P (View-Only) (Signed)
Chief Complaint:  Morbid obesity BMI 44  History of Present Illness:  Lori Beard is an 62 y.o. female who was last seen in May 2013 for lapband surgery.  Her UGI shows a small hiatus hernia.  She has had a lap cholecystectomy.  She is ready for her lapband surgery and has no other questions  Past Medical History  Diagnosis Date  . Arthritis   . Hypertension   . Morbid obesity     Past Surgical History  Procedure Laterality Date  . Abdominal hysterectomy    . Knee arthroscopy    . Cholecystectomy    . Hip surgery      bilateral  . Breath tek h pylori  02/15/2012    Procedure: BREATH TEK H PYLORI;  Surgeon: Valarie Merino, MD;  Location: [facility];  Service: General;  Laterality: N/A;  745    Current Outpatient Prescriptions  Medication Sig Dispense Refill  . amitriptyline (ELAVIL) 25 MG tablet Take 3 tablets (75 mg total) by mouth at bedtime. NEEDS OFFICE VISIT- 3rd notice  45 tablet  0  . aspirin 325 MG tablet Take 325 mg by mouth every morning.       [name] CRANBERRY PO Take by mouth daily.      . furosemide (LASIX) 40 MG tablet Take 40 mg by mouth daily.      . hydrocodone-acetaminophen (LORCET PLUS) 7.5-650 MG per tablet Take 1 tablet by mouth every 6 (six) hours as needed.      . nebivolol (BYSTOLIC) 5 MG tablet Take 5 mg by mouth every morning.      . pantoprazole (PROTONIX) 40 MG tablet Take 40 mg by mouth daily.      . verapamil (CALAN-SR) 240 MG CR tablet Take 240 mg by mouth every morning.       Current Facility-Administered Medications  Medication Dose Route Frequency Provider Last Rate Last Dose  . triamcinolone acetonide (KENALOG-40) injection 80 mg  80 mg Intra-articular Once Otho Darner, MD       Ace inhibitors and Morphine and related Family History  Problem Relation Age of Onset  . Kidney disease Mother   . Heart disease Father   . Cancer Father     colon  . Cancer Sister     breast   Social History:   reports that she has never smoked. She  does not have any smokeless tobacco history on file. She reports that she does not drink alcohol or use illicit drugs.   REVIEW OF SYSTEMS - PERTINENT POSITIVES ONLY: Neg for DVT  Physical Exam:   Blood pressure 112/70, pulse 70, temperature 97.6 F (36.4 C), temperature source Temporal, resp. rate 16, height 5\' 4"  (1.626 m), weight 257 lb 9.6 oz (116.847 kg). Body mass index is 44.2 kg/(m^2).  Gen:  WDWN AAF NAD  Neurological: Alert and oriented to person, place, and time. Motor and sensory function is grossly intact  Head: Normocephalic and atraumatic.  Eyes: Conjunctivae are normal. Pupils are equal, round, and reactive to light. No scleral icterus.  Neck: Normal range of motion. Neck supple. No tracheal deviation or thyromegaly present.  Cardiovascular:  SR without murmurs or gallops.  No carotid bruits Respiratory: Effort normal.  No respiratory distress. No chest wall tenderness. Breath sounds normal.  No wheezes, rales or rhonchi.  Abdomen:  nontender GU: Musculoskeletal: Normal range of motion. Extremities are nontender. No cyanosis, edema or clubbing noted Lymphadenopathy: No cervical, preauricular, postauricular or axillary adenopathy is present  Skin: Skin is warm and dry. No rash noted. No diaphoresis. No erythema. No pallor. Pscyh: Normal mood and affect. Behavior is normal. Judgment and thought content normal.   LABORATORY RESULTS: No results found for this or any previous visit (from the past 48 hour(s)).  RADIOLOGY RESULTS: No results found.  Problem List: Patient Active Problem List  Diagnosis  . Obesity    Assessment & Plan: Morbid obesity BMI 44 for lapband.  Primary care is Lupe Carney.  Has small hh and ? History of GER    Matt B. Daphine Deutscher, MD, [facility] [phone] beeper [phone]  11/10/2012 12:06 PM

## 2012-11-22 NOTE — Preoperative (Signed)
Beta Blockers   Reason not to administer Beta Blockers:Not Applicable 

## 2012-11-22 NOTE — Transfer of Care (Signed)
Immediate Anesthesia Transfer of Care Note  Patient: Lori Beard  Procedure(s) Performed: Procedure(s): MESH APPLIED TO LAP PORT (N/A) LAPAROSCOPIC GASTRIC BANDING WITH HIATAL HERNIA REPAIR (N/A)  Patient Location: PACU  Anesthesia Type:General  Level of Consciousness: awake, sedated and patient cooperative  Airway & Oxygen Therapy: Patient Spontanous Breathing and Patient connected to face mask oxygen  Post-op Assessment: Report given to PACU RN and Post -op Vital signs reviewed and stable  Post vital signs: Reviewed and stable  Complications: No apparent anesthesia complications

## 2012-11-23 ENCOUNTER — Ambulatory Visit (HOSPITAL_COMMUNITY): Payer: 59

## 2012-11-23 ENCOUNTER — Encounter (HOSPITAL_COMMUNITY): Payer: Self-pay | Admitting: Surgery

## 2012-11-23 DIAGNOSIS — G4733 Obstructive sleep apnea (adult) (pediatric): Secondary | ICD-10-CM | POA: Diagnosis not present

## 2012-11-23 DIAGNOSIS — Z4651 Encounter for fitting and adjustment of gastric lap band: Secondary | ICD-10-CM | POA: Diagnosis not present

## 2012-11-23 DIAGNOSIS — K449 Diaphragmatic hernia without obstruction or gangrene: Secondary | ICD-10-CM | POA: Diagnosis not present

## 2012-11-23 DIAGNOSIS — Z79899 Other long term (current) drug therapy: Secondary | ICD-10-CM | POA: Diagnosis not present

## 2012-11-23 DIAGNOSIS — Z9884 Bariatric surgery status: Secondary | ICD-10-CM

## 2012-11-23 DIAGNOSIS — I10 Essential (primary) hypertension: Secondary | ICD-10-CM | POA: Diagnosis not present

## 2012-11-23 DIAGNOSIS — Z6841 Body Mass Index (BMI) 40.0 and over, adult: Secondary | ICD-10-CM | POA: Diagnosis not present

## 2012-11-23 LAB — CBC WITH DIFFERENTIAL/PLATELET
Eosinophils Absolute: 0 10*3/uL (ref 0.0–0.7)
Hemoglobin: 11.7 g/dL — ABNORMAL LOW (ref 12.0–15.0)
Lymphocytes Relative: 12 % (ref 12–46)
Lymphs Abs: 1.1 10*3/uL (ref 0.7–4.0)
MCH: 25.7 pg — ABNORMAL LOW (ref 26.0–34.0)
MCV: 84.2 fL (ref 78.0–100.0)
Monocytes Relative: 2 % — ABNORMAL LOW (ref 3–12)
Neutrophils Relative %: 86 % — ABNORMAL HIGH (ref 43–77)
Platelets: 226 10*3/uL (ref 150–400)
RBC: 4.55 MIL/uL (ref 3.87–5.11)
WBC: 9.5 10*3/uL (ref 4.0–10.5)

## 2012-11-23 MED ORDER — OXYCODONE-ACETAMINOPHEN 5-325 MG/5ML PO SOLN: 5.0000 mL | ORAL | Status: DC | PRN

## 2012-11-23 MED ORDER — IOHEXOL 300 MG/ML  SOLN
50.0000 mL | Freq: Once | INTRAMUSCULAR | Status: AC | PRN
  Administered 2012-11-23: 20 mL via ORAL

## 2012-11-23 NOTE — Discharge Summary (Signed)
Physician Discharge Summary  Patient ID: NHUNG DANKO MRN: [id number] DOB/AGE: 10/25/1950 62 y.o.  Admit date: 11/22/2012 Discharge date: 11/23/2012  Admission Diagnoses:  Obesity and GERD with hiatus hernia  Discharge Diagnoses:  same  Active Problems:   Lapband APS + hiatus hernia repair March 2014   Surgery: see above  Discharged Condition: good  Hospital Course:   Had surgery.  Awaiting swallow on PD 1  Consults: none  Significant Diagnostic Studies: UGI pending    Discharge Exam: Blood pressure 165/77, pulse 70, temperature 98 F (36.7 C), temperature source Oral, resp. rate 18, height 5' 3.5" (1.613 m), weight 250 lb 8 oz (113.626 kg), SpO2 97.00%. Minimal abdominal pain  Disposition: 01-Home or Self Care  Discharge Orders   Future Appointments Provider Department Dept Phone   12/06/2012 4:00 PM [facility] [phone]   12/15/2012 11:55 AM Valarie Merino, MD [facility], [place] [phone]   Future Orders Complete By Expires     Diet Carb Modified  As directed     Discharge instructions  As directed     Comments:      Follow bariatric dietary orders    Increase activity slowly  As directed     No wound care  As directed         Medication List    STOP taking these medications       hydrocodone-acetaminophen 7.5-650 MG per tablet  Commonly known as:  LORCET PLUS      TAKE these medications       amitriptyline 25 MG tablet  Commonly known as:  ELAVIL  Take 3 tablets (75 mg total) by mouth at bedtime. NEEDS OFFICE VISIT- 3rd notice     aspirin 325 MG tablet  Take 325 mg by mouth every morning.     CRANBERRY PO  Take by mouth daily.     furosemide 40 MG tablet  Commonly known as:  LASIX  Take 40 mg by mouth daily.     nebivolol 5 MG tablet  Commonly known as:  BYSTOLIC  Take 5 mg by mouth every morning.     oxyCODONE-acetaminophen 5-325 MG/5ML solution  Commonly  known as:  ROXICET  Take 5 mLs by mouth every 4 (four) hours as needed for pain.     pantoprazole 40 MG tablet  Commonly known as:  PROTONIX  Take 40 mg by mouth daily.     verapamil 240 MG CR tablet  Commonly known as:  CALAN-SR  Take 240 mg by mouth every morning.           Follow-up Information   Follow up with Valarie Merino, MD.   Contact information:   [address] [place] [address] [phone]       Signed: Valarie Merino 11/23/2012, 7:46 AM

## 2012-11-23 NOTE — Progress Notes (Signed)
Patient is alert and oriented. VSS.  Patient is tolerating water, progressed to bariatric shakes.  Patient denies nausea, vomiting, burping, passing gas, or bowel movement.  Patient has ambulated in hallway, uses incentive spirometry, and wears compression hose while in bed.  Patient has follow up appointments with [facility] and [facility].  The following discharge instructions listed below reviewed with patient in detail, patient verbalized understanding.    Emunah Texidor Jonna Clark                                                                       ADJUSTABLE GASTRIC BAND  Home Care Instructions  These instructions are to help you care for yourself when you go home.  Call: If you have any problems.   Call [phone] and ask for the surgeon on call   If you need immediate assistance come to the ER at [facility]. Tell the ER staff that you are a new post-op gastric banding patient   Signs and symptoms to report:   Severe vomiting or nausea o If you cannot handle clear liquids for longer than 1 day, call your surgeon    Abdominal pain which does not get better after taking your pain medication   Fever greater than 100.4 F and chills   Heart rate over 100 beats a minute   Trouble breathing   Chest pain    Redness, swelling, drainage, or foul odor at incision (surgical) sites    If your incisions open or pull apart   Swelling or pain in calf (lower leg)   Diarrhea (Loose bowel movements that happen often), frequent watery, uncontrolled bowel movements   Constipation, (no bowel movements for 3 days) if this happens:  o Take Milk of Magnesia, 2 tablespoons by mouth, 3 times a day for 2 days if needed o Stop taking Milk of Magnesia once you have had a bowel movement o Call your doctor if constipation continues Or o Take Miralax  (instead of Milk of Magnesia) following the label instructions o Stop taking Miralax once you have had a bowel movement o Call your doctor if constipation continues   Anything  you think is "abnormal for you"   Normal side effects after surgery:   Unable to sleep at night or unable to concentrate   Irritability   Being tearful (crying) or depressed These are common complaints, possibly related to your anesthesia, stress of surgery, and change in lifestyle, that usually go away a few weeks after surgery.  If these feelings continue, call your medical doctor.   Wound Care: You may have surgical glue, steri-strips, or staples over your incisions after surgery   Surgical glue:  Looks like a clear film over your incisions and will wear off a little at a time   Steri-strips : Adhesive strips of tape over your incisions. You may notice a yellowish color on the skin under the steri-strips. This is used to make the   steri-strips stick better. Do not pull the steri-strips off - let them fall off   Staples: Staples may be removed before you leave the hospital o If you go home with staples, call [facility] at for an appointment with your surgeon's nurse to have staples removed 10 days  after surgery, [phone]   Showering: You may shower two (2) days after your surgery unless your surgeon tells you differently o Wash gently around incisions with warm soapy water, rinse well, and gently pat dry  o If you have a drain (tube from your incision), you may need someone to hold this while you shower  o No tub baths until staples are removed and incisions are healed     Medications:   Medications should be liquid or crushed if larger than the size of a dime   Extended release pills (medication that releases a little bit at a time through the day) should not be crushed   Depending on the size and number of medications you take, you may need to space (take a few throughout the day)/change the time you take your medications so that you do not over-fill your pouch (smaller stomach)   Make sure you follow-up with your primary care physician to make medication changes needed  during rapid weight loss and life-style changes   If you have diabetes, follow up with the doctor that orders your diabetes medication(s) within one week after surgery and check your blood sugar regularly.   Do not drive while taking narcotics (pain medications)   Do not take acetaminophen (Tylenol) and Roxicet or Lortab Elixir at the same time since these pain medications contain acetaminophen  Diet:                    First 2 Weeks  You will see the nutritionist about two (2) weeks after your surgery. The nutritionist will increase the types of foods you can eat if you are handling liquids well:   If you have severe vomiting or nausea and cannot handle clear liquids lasting longer than 1 day, call your surgeon  For Same Day Surgery Discharge Patients:    The day of surgery drink water only: 2 ounces every 4 hours   If you are handling water, start drinking your high protein shake the next morning For Overnight Stay Patients:    Begin by drinking 2 ounces of a high protein every 3 hours, 5 - 6 times per day   Slowly increase the amount you drink as tolerated   You may find it easier to slowly sip shakes throughout the day.  It is important to get your proteins in first Protein Shake   Drink at least 2 ounces of shake 5-6 times per day   Each serving of protein shakes (usually 8 - 12 ounces) should have a minimum of:  o 15 grams of protein  o And no more than 5 grams of carbohydrate    Goal for protein each day: o Men = 80 grams per day o Women = 60 grams per day   Protein powder may be added to fluids such as non-fat milk or Lactaid milk or Soy milk (limit to 35 grams added protein powder per serving)  Hydration   Slowly increase the amount of water and other clear liquids as tolerated (See Acceptable Fluids)   Slowly increase the amount of protein shake as tolerated     Sip fluids slowly and throughout the day   May use sugar substitutes in small amounts (no more than 6 - 8 packets per  day; i.e. Splenda)  Fluid Goal   The first goal is to drink at least 8 ounces of protein shake/drink per day (or as directed by the nutritionist); some examples of protein shakes are  Syntrax Nectar, Dillard's, EAS Edge HP, and Unjury.  See handout from pre-op Bariatric Education Class: o Slowly increase the amount of protein shake you drink as tolerated o You may find it easier to slowly sip shakes throughout the day o It is important to get your proteins in first   Your fluid goal is to drink 64 - 100 ounces of fluid daily o It may take a few weeks to build up to this   32 oz (or more) should be clear liquids  And    32 oz (or more) should be full liquids (see below for examples)   Liquids should not contain sugar, caffeine, or carbonation  Clear Liquids:   Water or Sugar-free flavored water (i.e. Fruit H2O, Propel)   Decaffeinated coffee or tea (sugar-free)   Crystal Lite, Wyler's Lite, Minute Maid Lite   Sugar-free Jell-O   Bouillon or broth   Sugar-free Popsicle:   *Less than 20 calories each; Limit 1 per day            Full Liquids: Protein Shakes/Drinks + 2 choices per day of other full liquids   Full liquids must be: o No More Than 12 grams of Carbs per serving  o No More Than 3 grams of Fat per serving   Strained low-fat cream soup   Non-Fat milk   Fat-free Lactaid Milk   Sugar-free yogurt (Dannon Lite & Fit, Greek yogurt)   Vitamins and Minerals   Start 1 day after surgery unless otherwise directed by your surgeon   1 Chewable Multivitamin / Multimineral Supplement with iron (i.e. Centrum for Adults)   Chewable Calcium Citrate with Vitamin D-3 (Example: 3 Chewable Calcium Plus 600 with Vitamin D-3) o Take 500 mg three (3) times a day for a total of 1500 mg each day o Do not take all 3 doses of calcium at one time as it may cause constipation, and you can only absorb 500 mg  at a time  o Do not mix multivitamins containing iron with calcium supplements; take 2  hours apart o Do not substitute Tums (calcium carbonate) for your calcium   Menstruating women and those at risk for anemia (a blood disease that causes weakness) may need extra iron o Talk with your doctor to see if you need more iron   If you need extra iron: Total daily Iron recommendation (including Vitamins) is 50 to 100 mg Iron/day   Do not stop taking or change any vitamins or minerals until you talk to your nutritionist or surgeon   Your nutritionist and/or surgeon must approve all vitamin and mineral supplements  Activity and Exercise: It is important to continue walking at home.  Limit your physical activity as instructed by your doctor.  During this time, use these guidelines:   Do not lift anything greater than ten (10) pounds for at least two (2) weeks   Do not go back to work or drive until Designer, industrial/product says you can   You may have sex when you feel comfortable  o It is VERY important for female patients to use a reliable birth control method; fertility often increases after surgery  o Do not get pregnant for at least 18 months   Start exercising as soon as your doctor tells you that you can o Make sure your doctor approves any physical activity   Start with a simple walking program   Walk 5-15 minutes each day, 7 days per week.    Slowly  increase until you are walking 30-45 minutes per day   Consider joining our BELT program. [phone] or email [email or web address]   Special Instructions Things to remember:   Free counseling is available for you and your family through collaboration between [facility] and [place]. Please call [phone] and leave a message   Use your CPAP when sleeping if this applies to you    Consider buying a medical alert bracelet that says you had lap-band surgery    You will likely have your first fill (fluid added to your band) 6 - 8 weeks after surgery   [facility] has a free Bariatric Surgery Support Group that meets monthly, the 3rd Thursday,  6 pm, [facility] Classrooms You can see classes online at [email or web address]   It is very important to keep all follow up appointments with your surgeon, nutritionist, primary care physician, and behavioral health practitioner o After the first year, please follow up with your bariatric surgeon and nutritionist at least once a year in order to maintain best weight loss results [facility]: [phone] [facility]: [phone] Bariatric Nurse Coordinator: [phone]   Reviewed and Endorsed  by [facility] Patient Education Committee, Jan, 2014

## 2012-12-06 ENCOUNTER — Encounter: Payer: 59 | Attending: Surgery | Admitting: *Deleted

## 2012-12-06 VITALS — Ht 63.5 in | Wt 242.0 lb

## 2012-12-06 DIAGNOSIS — Z713 Dietary counseling and surveillance: Secondary | ICD-10-CM | POA: Insufficient documentation

## 2012-12-06 DIAGNOSIS — E669 Obesity, unspecified: Secondary | ICD-10-CM | POA: Insufficient documentation

## 2012-12-06 NOTE — Patient Instructions (Addendum)
Patient to follow Phase 3A-Soft, High Protein Diet and follow-up at [facility] in 4 weeks for 6 week post-op nutrition visit for diet advancement. 

## 2012-12-06 NOTE — Progress Notes (Signed)
  2 Week Post-Operative Nutrition Class:  Appt start time: 1630 end time:  1700.  Patient was seen on 12/06/2012 for Post-Operative Nutrition [facility].   Surgery date: 11/22/12 Surgery type: LAGB Start weight at [facility]: 256.5 lbs (02/05/12)  Weight today: 242.0 lbs Weight change: 14.5 lbs Total weight lost: 14.5 lbs  TANITA  BODY COMP RESULTS  12/06/12   BMI (kg/m^2) 42.2   Fat Mass (lbs) 119.0   Fat Free Mass (lbs) 123.0   Total Body Water (lbs) 90.0   The following the learning objectives were met by the patient during this visit:  Identifies Phase 3A (Soft, High Proteins) Dietary Goals and will begin from 2 weeks post-operatively to 2 months post-operatively  Identifies appropriate sources of fluids and proteins   States protein recommendations and appropriate sources post-operatively  Identifies the need for appropriate texture modifications, mastication, and bite sizes when consuming solids  Identifies appropriate multivitamin and calcium sources post-operatively  Describes the need for physical activity post-operatively and will follow MD recommendations  States when to call healthcare provider regarding medication questions or post-operative complications  Handouts given during visit include:  Phase 3A: Soft, High Protein Diet Handout  Band Fill Guidelines Handout  Follow-Up Plan: Patient will follow-up at [facility] in 4 weeks for 6 week post-op nutrition visit for diet advancement per MD.

## 2012-12-15 ENCOUNTER — Encounter (INDEPENDENT_AMBULATORY_CARE_PROVIDER_SITE_OTHER): Payer: Self-pay | Admitting: Surgery

## 2012-12-15 ENCOUNTER — Ambulatory Visit (INDEPENDENT_AMBULATORY_CARE_PROVIDER_SITE_OTHER): Payer: Commercial Managed Care - PPO | Admitting: Surgery

## 2012-12-15 VITALS — BP 126/80 | HR 65 | Temp 98.2°F | Resp 18 | Ht 64.0 in | Wt 239.0 lb

## 2012-12-15 DIAGNOSIS — Z9884 Bariatric surgery status: Secondary | ICD-10-CM

## 2012-12-15 NOTE — Progress Notes (Signed)
Lori Beard 62 y.o.  Body mass index is 41 kg/(m^2).  Patient Active Problem List  Diagnosis  . Obesity  . Arthritis-bilateral hip replacements  . Lapband APS + hiatus hernia repair March 2014    Allergies  Allergen Reactions  . Ace Inhibitors Swelling  . Morphine And Related Itching and Rash    Past Surgical History  Procedure Laterality Date  . Abdominal hysterectomy    . Knee arthroscopy    . Cholecystectomy    . Breath tek h pylori  02/15/2012    Procedure: BREATH TEK H PYLORI;  Surgeon: Valarie Merino, MD;  Location: [facility];  Service: General;  Laterality: N/A;  745  . Joint replacement      BILATERAL TOTAL HIP REPLACEMENTS  . Mesh applied to lap port N/A 11/22/2012    Procedure: MESH APPLIED TO LAP PORT;  Surgeon: Valarie Merino, MD;  Location: [facility];  Service: General;  Laterality: N/A;   Benita Stabile, MD No diagnosis found.  Doing well three weeks after lap band.  Incisions OK.  Will see in 3 weeks for first band fill Matt B. Daphine Deutscher, MD, [facility] [phone] beeper [phone]  12/15/2012 12:32 PM

## 2013-01-03 ENCOUNTER — Ambulatory Visit: Payer: 59 | Admitting: *Deleted

## 2013-01-04 ENCOUNTER — Encounter: Payer: 59 | Admitting: *Deleted

## 2013-01-04 ENCOUNTER — Encounter: Payer: Self-pay | Admitting: *Deleted

## 2013-01-04 VITALS — Ht 63.5 in | Wt 233.5 lb

## 2013-01-04 DIAGNOSIS — E669 Obesity, unspecified: Secondary | ICD-10-CM

## 2013-01-04 NOTE — Patient Instructions (Addendum)
Goals:  Follow Phase 3B: High Protein + Non-Starchy Vegetables  Increase lean protein foods to meet 60g goal  Increase fluid intake to 64oz +  Avoid drinking 15 minutes before, during and 30 minutes after eating  Aim for >30 min of physical activity daily  Resume calcium supplementation - aim for a total of 1000-1200 mg (minimum) daily in separate 3 doses  YOU LOST 10.5 lbs of FAT MASS!!!  TANITA  BODY COMP RESULTS  12/06/12 01/04/13   BMI (kg/m^2) 42.2 40.7   Fat Mass (lbs) 119.0 108.5   Fat Free Mass (lbs) 123.0 125.0   Total Body Water (lbs) 90.0 91.5

## 2013-01-04 NOTE — Progress Notes (Signed)
Follow-up visit:  8 Weeks Post-Operative LAGB Surgery  Medical Nutrition Therapy:  Appt start time: 1030   End time: 1100.  Primary concerns today: Post-operative Bariatric Surgery Nutrition Management. Emilyn returns for 2 mo f/u with an additional 8.5 lb wt loss (down 10.5 lbs of FAT MASS).  Reports pain on right side of lower back (near waistline); states it is getting somewhat better.  Advised to alert Dr. Daphine Deutscher or PCP if worsens. Not taking calcium or exercising consistently. Advancing to phase 3B today.  Surgery date: 11/22/12 Surgery type: LAGB Start weight at [facility]: 256.5 lbs (02/05/12)  Weight today: 233.5 lbs Weight change: 8.5 lbs Total weight lost: 23.0 lbs  Goal weight: 200 lbs % goal met: 41%  TANITA  BODY COMP RESULTS  12/06/12 01/04/13   BMI (kg/m^2) 42.2 40.7   Fat Mass (lbs) 119.0 108.5   Fat Free Mass (lbs) 123.0 125.0   Total Body Water (lbs) 90.0 91.5   24-hr recall: B (AM): 2 eggs w/ cheese (1 oz) - 20g Snk (AM): NONE  L (PM): 1-2 eggs w/ cheese OR 1 pc [place] sausage - 20-30g Snk (PM): 1 pc [place] sausage (if not at lunch) D (PM): 1/2 c beans (lima, pinto, kidney) - 8g Snk (PM): SF popsicle or NONE  Fluid intake:  32-48 oz water Estimated total protein intake: 50g  Medications: No longer taking Cranberry pills Supplementation:  Taking MVI, B12, and calcium. Only getting calcium in 2 days/week    Using straws: No Drinking while eating: No Hair loss: No Carbonated beverages: 1 time N/V/D/C:  1 epidose of constipation; took MOM  Last Lap-Band fill:  None yet. Appt on 5/7 with Dr. Daphine Deutscher for fill  Recent physical activity:  Walks a little; working on increasing  Progress Towards Goal(s):  In progress.  Handouts given during visit include:  Phase 3B: High Protein + Non-Starchy Vegetables   Nutritional Diagnosis:  [place]-3.3 Overweight/obesity related to past poor dietary habits and physical inactivity as evidenced by patient w/ recent LAGB surgery  following dietary guidelines for continued weight loss.    Intervention:  Nutrition education/diet advancement.  Monitoring/Evaluation:  Dietary intake, exercise, lap band fills, and body weight. Follow up in 1 month for 3 month post-op visit.

## 2013-01-07 ENCOUNTER — Emergency Department (HOSPITAL_COMMUNITY)
Admission: EM | Admit: 2013-01-07 | Discharge: 2013-01-08 | Disposition: A | Discharge status: Home / Self Care | Payer: 59 | Attending: Emergency Medicine | Admitting: Emergency Medicine

## 2013-01-07 ENCOUNTER — Encounter (HOSPITAL_COMMUNITY): Payer: Self-pay | Admitting: Emergency Medicine

## 2013-01-07 DIAGNOSIS — R0989 Other specified symptoms and signs involving the circulatory and respiratory systems: Secondary | ICD-10-CM | POA: Insufficient documentation

## 2013-01-07 DIAGNOSIS — Z7982 Long term (current) use of aspirin: Secondary | ICD-10-CM | POA: Insufficient documentation

## 2013-01-07 DIAGNOSIS — I10 Essential (primary) hypertension: Secondary | ICD-10-CM | POA: Insufficient documentation

## 2013-01-07 DIAGNOSIS — Z79899 Other long term (current) drug therapy: Secondary | ICD-10-CM | POA: Insufficient documentation

## 2013-01-07 DIAGNOSIS — M129 Arthropathy, unspecified: Secondary | ICD-10-CM | POA: Insufficient documentation

## 2013-01-07 DIAGNOSIS — G4730 Sleep apnea, unspecified: Secondary | ICD-10-CM | POA: Insufficient documentation

## 2013-01-07 DIAGNOSIS — K219 Gastro-esophageal reflux disease without esophagitis: Secondary | ICD-10-CM | POA: Insufficient documentation

## 2013-01-07 DIAGNOSIS — R0609 Other forms of dyspnea: Secondary | ICD-10-CM | POA: Insufficient documentation

## 2013-01-07 DIAGNOSIS — R319 Hematuria, unspecified: Secondary | ICD-10-CM | POA: Insufficient documentation

## 2013-01-07 DIAGNOSIS — R109 Unspecified abdominal pain: Secondary | ICD-10-CM

## 2013-01-07 DIAGNOSIS — N390 Urinary tract infection, site not specified: Secondary | ICD-10-CM | POA: Insufficient documentation

## 2013-01-07 LAB — COMPREHENSIVE METABOLIC PANEL
Alkaline Phosphatase: 87 U/L (ref 39–117)
BUN: 18 mg/dL (ref 6–23)
Chloride: 101 mEq/L (ref 96–112)
Creatinine, Ser: 0.8 mg/dL (ref 0.50–1.10)
GFR calc Af Amer: 90 mL/min — ABNORMAL LOW (ref >90)
GFR calc non Af Amer: 77 mL/min — ABNORMAL LOW (ref >90)
Glucose, Bld: 85 mg/dL (ref 70–99)
Potassium: 3.6 mEq/L (ref 3.5–5.1)
Total Bilirubin: 0.3 mg/dL (ref 0.3–1.2)

## 2013-01-07 LAB — LIPASE, BLOOD: Lipase: 19 U/L (ref 11–59)

## 2013-01-07 LAB — URINALYSIS, MICROSCOPIC ONLY
Ketones, ur: >80 mg/dL — AB
Nitrite: NEGATIVE
Protein, ur: 30 mg/dL — AB
Urobilinogen, UA: 1 mg/dL (ref 0.0–1.0)
pH: 6 (ref 5.0–8.0)

## 2013-01-07 LAB — CBC WITH DIFFERENTIAL/PLATELET
HCT: 39.2 % (ref 36.0–46.0)
Hemoglobin: 12 g/dL (ref 12.0–15.0)
Lymphs Abs: 2.3 10*3/uL (ref 0.7–4.0)
MCH: 25.4 pg — ABNORMAL LOW (ref 26.0–34.0)
Monocytes Absolute: 0.4 10*3/uL (ref 0.1–1.0)
Monocytes Relative: 6 % (ref 3–12)
Neutro Abs: 4 10*3/uL (ref 1.7–7.7)
Neutrophils Relative %: 58 % (ref 43–77)
RBC: 4.72 MIL/uL (ref 3.87–5.11)

## 2013-01-07 NOTE — ED Notes (Signed)
Patient reports that she is has had right flank pain x 2 -3 weeks. Went to dr. And was told that it was a sprain. Pain is still there and now when she urinates and wipes she has blood

## 2013-01-08 LAB — URINE MICROSCOPIC-ADD ON

## 2013-01-08 LAB — URINALYSIS, ROUTINE W REFLEX MICROSCOPIC
Hgb urine dipstick: NEGATIVE
Specific Gravity, Urine: 1.036 — ABNORMAL HIGH (ref 1.005–1.030)
Urobilinogen, UA: 1 mg/dL (ref 0.0–1.0)

## 2013-01-08 MED ORDER — CIPROFLOXACIN HCL 500 MG PO TABS: 500.0000 mg | ORAL_TABLET | Freq: Two times a day (BID) | ORAL | Status: AC

## 2013-01-08 MED ORDER — CIPROFLOXACIN HCL 500 MG PO TABS
500.0000 mg | ORAL_TABLET | Freq: Once | ORAL | Status: AC
  Administered 2013-01-08: 500 mg via ORAL
  Filled 2013-01-08: qty 1

## 2013-01-08 MED ORDER — HYDROCODONE-ACETAMINOPHEN 5-325 MG PO TABS: 2.0000 | ORAL_TABLET | Freq: Four times a day (QID) | ORAL | Status: DC | PRN

## 2013-01-08 MED ORDER — HYDROCODONE-ACETAMINOPHEN 5-325 MG PO TABS
2.0000 | ORAL_TABLET | Freq: Once | ORAL | Status: AC
  Administered 2013-01-08: 2 via ORAL
  Filled 2013-01-08: qty 2

## 2013-01-08 MED ORDER — CIPROFLOXACIN HCL 500 MG PO TABS: 500.0000 mg | ORAL_TABLET | Freq: Two times a day (BID) | ORAL | Status: DC

## 2013-01-08 NOTE — ED Provider Notes (Signed)
History     CSN: [id number]  Arrival date & time 01/07/13  2212   First MD Initiated Contact with Patient 01/08/13 0031      Chief Complaint  Patient presents with  . Flank Pain    (Consider location/radiation/quality/duration/timing/severity/associated sxs/prior treatment) HPI 62 yo female presents to the ER with complaint of right flank pain for the last 2-3 weeks.  She has been seen by her pcm who thought it was due to muscle strain and prescribed voltaren.  She denies any improvement with the pain. Today she has noticed a bit of blood when wiping.  Pt is s/p hysterectomy.  No fevers, vomiting.  Pain worse with twisting, sitting.  No radiation of pain.  Past Medical History  Diagnosis Date  . Hypertension   . Morbid obesity   . Shortness of breath     ONLY WITH EXERTION  . GERD (gastroesophageal reflux disease)   . Arthritis     ALL OVER  . Sleep apnea 02/15/2012    SLEEP STUDY IN EPIC - MILD OSA-CPAP NOT RECOMMENDED-HOME OXYGEN SUGGESTED BECAUSE OF OXYGEN DESATS ON ROOM AIR.    Past Surgical History  Procedure Laterality Date  . Abdominal hysterectomy    . Knee arthroscopy    . Cholecystectomy    . Breath tek h pylori  02/15/2012    Procedure: BREATH TEK H PYLORI;  Surgeon: Valarie Merino, MD;  Location: [facility];  Service: General;  Laterality: N/A;  745  . Joint replacement      BILATERAL TOTAL HIP REPLACEMENTS  . Mesh applied to lap port N/A 11/22/2012    Procedure: MESH APPLIED TO LAP PORT;  Surgeon: Valarie Merino, MD;  Location: [facility];  Service: General;  Laterality: N/A;    Family History  Problem Relation Age of Onset  . Kidney disease Mother   . Heart disease Father   . Cancer Father     colon  . Cancer Sister     breast    History  Substance Use Topics  . Smoking status: Never Smoker   . Smokeless tobacco: Never Used  . Alcohol Use: No    OB History   Grav Para Term Preterm Abortions TAB SAB Ect Mult Living                  Review  of Systems  All other systems reviewed and are negative.    Allergies  Ace inhibitors; Iron; and Morphine and related  Home Medications   Current Outpatient Rx  Name  Route  Sig  Dispense  Refill  . amitriptyline (ELAVIL) 25 MG tablet   Oral   Take 75 mg by mouth at bedtime.         [name] aspirin 325 MG tablet   Oral   Take 325 mg by mouth every morning.          . diclofenac (VOLTAREN) 75 MG EC tablet   Oral   Take 75 mg by mouth 2 (two) times daily.         . furosemide (LASIX) 40 MG tablet   Oral   Take 40 mg by mouth every morning.          [name] HYDROcodone-acetaminophen (NORCO/VICODIN) 5-325 MG per tablet   Oral   Take 1 tablet by mouth every 6 (six) hours as needed for pain.         . nebivolol (BYSTOLIC) 5 MG tablet   Oral   Take 5 mg  by mouth every morning.         . pantoprazole (PROTONIX) 40 MG tablet   Oral   Take 40 mg by mouth every morning.          . verapamil (CALAN-SR) 240 MG CR tablet   Oral   Take 240 mg by mouth every morning.         . ciprofloxacin (CIPRO) 500 MG tablet   Oral   Take 1 tablet (500 mg total) by mouth 2 (two) times daily.   10 tablet   0   . HYDROcodone-acetaminophen (NORCO/VICODIN) 5-325 MG per tablet   Oral   Take 2 tablets by mouth every 6 (six) hours as needed for pain.   20 tablet   0     BP 153/90  Pulse 66  Temp(Src) 98.1 F (36.7 C) (Oral)  Resp 16  Ht 5\' 4"  (1.626 m)  Wt 233 lb (105.688 kg)  BMI 39.97 kg/m2  SpO2 9%  Physical Exam  Nursing note and vitals reviewed. Constitutional: She is oriented to person, place, and time. She appears well-developed and well-nourished.  HENT:  Head: Normocephalic and atraumatic.  Nose: Nose normal.  Mouth/Throat: Oropharynx is clear and moist.  Eyes: Conjunctivae and EOM are normal. Pupils are equal, round, and reactive to light.  Neck: Normal range of motion. Neck supple. No JVD present. No tracheal deviation present. No thyromegaly present.   Cardiovascular: Normal rate, regular rhythm, normal heart sounds and intact distal pulses.  Exam reveals no gallop and no friction rub.   No murmur heard. Pulmonary/Chest: Effort normal and breath sounds normal. No stridor. No respiratory distress. She has no wheezes. She has no rales. She exhibits no tenderness.  Abdominal: Soft. Bowel sounds are normal. She exhibits no distension and no mass. There is no tenderness. There is no rebound and no guarding.  Musculoskeletal: Normal range of motion. She exhibits no edema and no tenderness.  No pain with palpation, but pain with twisting, bending over  Lymphadenopathy:    She has no cervical adenopathy.  Neurological: She is alert and oriented to person, place, and time. She exhibits normal muscle tone. Coordination normal.  Skin: Skin is warm and dry. No rash noted. No erythema. No pallor.  Psychiatric: She has a normal mood and affect. Her behavior is normal. Judgment and thought content normal.    ED Course  Procedures (including critical care time)  Labs Reviewed  CBC WITH DIFFERENTIAL - Abnormal; Notable for the following:    MCH 25.4 (*)    RDW 15.7 (*)    All other components within normal limits  COMPREHENSIVE METABOLIC PANEL - Abnormal; Notable for the following:    Albumin 3.3 (*)    GFR calc non Af Amer 77 (*)    GFR calc Af Amer 90 (*)    All other components within normal limits  URINALYSIS, MICROSCOPIC ONLY - Abnormal; Notable for the following:    APPearance CLOUDY (*)    Specific Gravity, Urine 1.033 (*)    Bilirubin Urine LARGE (*)    Ketones, ur >80 (*)    Protein, ur 30 (*)    Leukocytes, UA SMALL (*)    Bacteria, UA FEW (*)    Squamous Epithelial / LPF MANY (*)    All other components within normal limits  URINALYSIS, ROUTINE W REFLEX MICROSCOPIC - Abnormal; Notable for the following:    APPearance CLOUDY (*)    Specific Gravity, Urine 1.036 (*)    Bilirubin  Urine LARGE (*)    Ketones, ur >80 (*)    Protein,  ur 30 (*)    Leukocytes, UA MODERATE (*)    All other components within normal limits  URINE MICROSCOPIC-ADD ON - Abnormal; Notable for the following:    Squamous Epithelial / LPF MANY (*)    Bacteria, UA FEW (*)    All other components within normal limits  URINE CULTURE  URINE CULTURE  LIPASE, BLOOD   No results found.   1. Right flank pain   2. Urinary tract infection       MDM  62 yo female with ongoing right flank pain for a few weeks, now with dysuria.  Urine not clean catch, but suspect UTI.  Do not feel flank pain is due to kidney stone or pyelonephritis, more likely msk in origin.  Pt feeling better after vicodin.          Olivia Mackie, MD 01/08/13 [phone]

## 2013-01-09 LAB — URINE CULTURE: Colony Count: 40000

## 2013-01-11 ENCOUNTER — Encounter (INDEPENDENT_AMBULATORY_CARE_PROVIDER_SITE_OTHER): Payer: Self-pay | Admitting: Surgery

## 2013-01-11 ENCOUNTER — Ambulatory Visit (INDEPENDENT_AMBULATORY_CARE_PROVIDER_SITE_OTHER): Payer: Commercial Managed Care - PPO | Admitting: Surgery

## 2013-01-11 VITALS — BP 174/108 | HR 64 | Temp 97.8°F | Resp 16 | Ht 64.0 in | Wt 233.2 lb

## 2013-01-11 DIAGNOSIS — Z9884 Bariatric surgery status: Secondary | ICD-10-CM

## 2013-01-11 NOTE — Patient Instructions (Signed)

## 2013-01-11 NOTE — Progress Notes (Deleted)
Pt comes in today for suture removal.  After getting the pt situated comfortably in chair I examined the incision.  Incision looks good - completely closed.  I then began to take out the continuous stitch.  I applied steri-strips.  Pt is concerned about a "tingleing" sensation he is experiencing.  I advised that Dr. Daphine Deutscher will be in office in a few minutes.  Pt decided to wait and speak with Dr. Daphine Deutscher.

## 2013-01-11 NOTE — Progress Notes (Signed)
Lapband Fill Encounter Problem List:   Patient Active Problem List   Diagnosis Date Noted  . Lapband APS + hiatus hernia repair March 2014 11/23/2012  . Arthritis-bilateral hip replacements 11/10/2012  . Obesity 01/22/2012    Lori Beard Body mass index is 40.01 kg/(m^2). Weight loss since surgery  24.4  Having regurgitation?:  no  Feel that they need a fill?  First fill  Nocturnal reflux?  no  Amount of fill  + 1.5   Port site: Accessed easily.    Instructions given and weight loss goals discussed.    Matt B. Daphine Deutscher, MD, FACS

## 2013-01-17 ENCOUNTER — Ambulatory Visit: Payer: 59 | Admitting: *Deleted

## 2013-02-01 ENCOUNTER — Encounter: Payer: 59 | Attending: Surgery | Admitting: *Deleted

## 2013-02-01 ENCOUNTER — Encounter: Payer: Self-pay | Admitting: *Deleted

## 2013-02-01 VITALS — Ht 64.0 in | Wt 226.5 lb

## 2013-02-01 DIAGNOSIS — Z713 Dietary counseling and surveillance: Secondary | ICD-10-CM | POA: Insufficient documentation

## 2013-02-01 DIAGNOSIS — E669 Obesity, unspecified: Secondary | ICD-10-CM | POA: Insufficient documentation

## 2013-02-01 NOTE — Patient Instructions (Addendum)
Goals:  Follow Phase 3B: High Protein + Non-Starchy Vegetables  Increase lean protein foods to meet 60-80g goal  Increase fluid intake to 64oz +  May add 15 grams of carbohydrate (fruit, whole grain) with meals  Always have protein with carbs!!  Aim for >30 min of physical activity daily  Take calcium citrate and other supplements consistently

## 2013-02-01 NOTE — Progress Notes (Signed)
Follow-up visit:  11 Weeks Post-Operative LAGB Surgery  Medical Nutrition Therapy:  Appt start time: 1115   End time: 1145.  Primary concerns today: Post-operative Bariatric Surgery Nutrition Management. Elisheba continues to lose fat mass (9 lbs since last visit), though protein and fluid intake remains a bit low. Discussed increasing. States she is exercising "a little".  Reports her pain in lower back from last visit was diagnosed by PCP as a urinary tract infection and has resolved. Not taking supplements consistently.   Surgery date: 11/22/12 Surgery type: LAGB Start weight at [facility]: 256.5 lbs (02/05/12)  Weight today: 226.5 lbs Weight change: 7.0 lbs Total weight lost: 30.0 lbs  Goal weight: 200 lbs % goal met: %  TANITA  BODY COMP RESULTS  12/06/12 01/04/13 02/01/13   BMI (kg/m^2) 42.2 40.7 38.9    Fat Mass (lbs) 119.0 108.5 99.5   Fat Free Mass (lbs) 123.0 125.0 127.0   Total Body Water (lbs) 90.0 91.5 93.0   24-hr recall: B (AM): 1 egg w/ cheese (1 oz) - 15g Snk (AM): NONE  L (PM): Salad with grilled 2 oz chicken and basalmic viniagrette - 20-25g Snk (PM):  NONE D (PM): 1/2 c beans (lima, pinto, kidney) - 8g Snk (PM):  Cheese (1-1.5 oz) - 13g  Fluid intake:  50-55 oz water (17 oz w/ SF Hawaiian Punch pkts) Estimated total protein intake: 55-60g  Medications:  No changes reported Supplementation:  Taking MVI, B12. Only getting 1 tbsp [place] calcium (500 mg) a day     Using straws: No Drinking while eating: No Hair loss: No Carbonated beverages:  1 ICE drink  N/V/D/C:  None  Last Lap-Band fill:  01/11/13 - added 1.5 cc  Recent physical activity:  Walks a little; working on increasing  Progress Towards Goal(s):  In progress.   Nutritional Diagnosis:  [place]-3.3 Overweight/obesity related to past poor dietary habits and physical inactivity as evidenced by patient w/ recent LAGB surgery following dietary guidelines for continued weight loss.    Intervention:  Nutrition  education/diet advancement.  Monitoring/Evaluation:  Dietary intake, exercise, lap band fills, and body weight. Follow up in 3 months for 6 month post-op visit.

## 2013-02-22 ENCOUNTER — Ambulatory Visit (INDEPENDENT_AMBULATORY_CARE_PROVIDER_SITE_OTHER): Payer: Commercial Managed Care - PPO | Admitting: Surgery

## 2013-02-22 ENCOUNTER — Encounter (INDEPENDENT_AMBULATORY_CARE_PROVIDER_SITE_OTHER): Payer: Self-pay | Admitting: Surgery

## 2013-02-22 VITALS — BP 122/78 | HR 73 | Temp 96.5°F | Ht 64.0 in | Wt 221.6 lb

## 2013-02-22 DIAGNOSIS — Z9884 Bariatric surgery status: Secondary | ICD-10-CM

## 2013-02-22 NOTE — Progress Notes (Signed)
Lapband Fill Encounter Problem List:   Patient Active Problem List   Diagnosis Date Noted  . Lapband APS + hiatus hernia repair March 2014 11/23/2012  . Arthritis-bilateral hip replacements 11/10/2012  . Obesity 01/22/2012    Yanelle A Hallberg Body mass index is 38.02 kg/(m^2). Weight loss since surgery  36 lbs  Having regurgitation?:  no  Feel that they need a fill?  yes  Nocturnal reflux?  no  Amount of fill  1.5     Instructions given and weight loss goals discussed.    Will see back in 6 weeks  Matt B. Daphine Deutscher, MD, FACS

## 2013-02-22 NOTE — Patient Instructions (Signed)

## 2013-04-02 ENCOUNTER — Emergency Department (HOSPITAL_COMMUNITY)
Admission: EM | Admit: 2013-04-02 | Discharge: 2013-04-03 | Disposition: A | Discharge status: Home / Self Care | Payer: 59 | Attending: Emergency Medicine | Admitting: Emergency Medicine

## 2013-04-02 ENCOUNTER — Encounter (HOSPITAL_COMMUNITY): Payer: Self-pay | Admitting: Emergency Medicine

## 2013-04-02 DIAGNOSIS — K219 Gastro-esophageal reflux disease without esophagitis: Secondary | ICD-10-CM | POA: Insufficient documentation

## 2013-04-02 DIAGNOSIS — I10 Essential (primary) hypertension: Secondary | ICD-10-CM | POA: Insufficient documentation

## 2013-04-02 DIAGNOSIS — Z7982 Long term (current) use of aspirin: Secondary | ICD-10-CM | POA: Insufficient documentation

## 2013-04-02 DIAGNOSIS — Z79899 Other long term (current) drug therapy: Secondary | ICD-10-CM | POA: Insufficient documentation

## 2013-04-02 DIAGNOSIS — K118 Other diseases of salivary glands: Secondary | ICD-10-CM | POA: Insufficient documentation

## 2013-04-02 DIAGNOSIS — Z8669 Personal history of other diseases of the nervous system and sense organs: Secondary | ICD-10-CM | POA: Insufficient documentation

## 2013-04-02 DIAGNOSIS — M129 Arthropathy, unspecified: Secondary | ICD-10-CM | POA: Insufficient documentation

## 2013-04-02 NOTE — ED Provider Notes (Signed)
CSN: [id number]     Arrival date & time 04/02/13  2259 History  This chart was scribed for Earley Favor, NP working with Olivia Mackie, MD by Greggory Stallion, ED scribe. This patient was seen in room WTR6/WTR6 and the patient's care was started at 11:12 PM.   Chief Complaint  Patient presents with  . Neck Pain   The history is provided by the patient. No language interpreter was used.    HPI Comments: Lori Beard is a 62 y.o. female who presents to the Emergency Department complaining of gradual onset, constant right sided neck pain with associated swelling that started earlier today. She states that she was eating cashews after church when the pain started. She states she is not allergic to cashews. Pt states her "bite" has been off since then. Pt denies ear infection, rhinorrhea and sore throat as associated symptoms.  Past Medical History  Diagnosis Date  . Hypertension   . Morbid obesity   . Shortness of breath     ONLY WITH EXERTION  . GERD (gastroesophageal reflux disease)   . Arthritis     ALL OVER  . Sleep apnea 02/15/2012    SLEEP STUDY IN EPIC - MILD OSA-CPAP NOT RECOMMENDED-HOME OXYGEN SUGGESTED BECAUSE OF OXYGEN DESATS ON ROOM AIR.   Past Surgical History  Procedure Laterality Date  . Abdominal hysterectomy    . Knee arthroscopy    . Cholecystectomy    . Breath tek h pylori  02/15/2012    Procedure: BREATH TEK H PYLORI;  Surgeon: Valarie Merino, MD;  Location: [facility];  Service: General;  Laterality: N/A;  745  . Joint replacement      BILATERAL TOTAL HIP REPLACEMENTS  . Mesh applied to lap port N/A 11/22/2012    Procedure: MESH APPLIED TO LAP PORT;  Surgeon: Valarie Merino, MD;  Location: [facility];  Service: General;  Laterality: N/A;  . Laparoscopic gastric banding     Family History  Problem Relation Age of Onset  . Kidney disease Mother   . Heart disease Father   . Cancer Father     colon  . Cancer Sister     breast   History  Substance Use Topics   . Smoking status: Never Smoker   . Smokeless tobacco: Never Used  . Alcohol Use: No   OB History   Grav Para Term Preterm Abortions TAB SAB Ect Mult Living                 Review of Systems  HENT: Positive for neck pain. Negative for sore throat and rhinorrhea.   All other systems reviewed and are negative.    Allergies  Ace inhibitors; Iron; and Morphine and related  Home Medications   Current Outpatient Rx  Name  Route  Sig  Dispense  Refill  . amitriptyline (ELAVIL) 25 MG tablet   Oral   Take 75 mg by mouth at bedtime.         [name] aspirin 325 MG tablet   Oral   Take 325 mg by mouth every morning.          . fish oil-omega-3 fatty acids 1000 MG capsule   Oral   Take 2 g by mouth daily.         . furosemide (LASIX) 40 MG tablet   Oral   Take 40 mg by mouth every morning.          [name] HYDROcodone-acetaminophen (NORCO/VICODIN) 5-325 MG  per tablet   Oral   Take 2 tablets by mouth every 6 (six) hours as needed for pain.   20 tablet   0   . nebivolol (BYSTOLIC) 5 MG tablet   Oral   Take 5 mg by mouth every morning.         . pantoprazole (PROTONIX) 40 MG tablet   Oral   Take 40 mg by mouth every morning.          . verapamil (CALAN-SR) 240 MG CR tablet   Oral   Take 240 mg by mouth every morning.          BP 158/90  Pulse 72  Temp(Src) 99 F (37.2 C) (Oral)  Resp 18  SpO2 99%  Physical Exam  Nursing note and vitals reviewed. Constitutional: She is oriented to person, place, and time. She appears well-developed and well-nourished. No distress.  HENT:  Head: Normocephalic and atraumatic.  Right Ear: External ear normal.  Left Ear: External ear normal.  Nose: Nose normal.  Mouth/Throat: Oropharynx is clear and moist.  No teeth from canine back. No teeth on lower gumline. No swelling. Stone in parotid gland.   Eyes: Conjunctivae are normal.  Neck: Normal range of motion.  Swollen area that is not consolidated. No mass. No lymph node.    Cardiovascular: Normal rate, regular rhythm and normal heart sounds.   Pulmonary/Chest: Effort normal and breath sounds normal. No stridor. No respiratory distress. She has no wheezes. She has no rales.  Abdominal: Soft. She exhibits no distension.  Musculoskeletal: Normal range of motion.  Neurological: She is alert and oriented to person, place, and time. She has normal strength.  Skin: Skin is warm and dry. She is not diaphoretic. No erythema.  Psychiatric: She has a normal mood and affect. Her behavior is normal.    ED Course   Procedures (including critical care time)  DIAGNOSTIC STUDIES: Oxygen Saturation is 99% on RA, normal by my interpretation.    COORDINATION OF CARE: 11:16 PM-Discussed treatment plan which includes amylase taste with pt at bedside and pt agreed to plan.   Labs Reviewed  AMYLASE - Abnormal; Notable for the following:    Amylase 155 (*)    All other components within normal limits   No results found. 1. Parotid duct obstruction     MDM  Elevated amylase levels   Referred to Dr. Suszanne Conners for evaluation of parotid duct stone  aleo recommend lemon drops        I personally performed the services described in this documentation, which was scribed in my presence. The recorded information has been reviewed and is accurate.   Arman Filter, NP 04/03/13 0041

## 2013-04-02 NOTE — ED Notes (Signed)
Patient states that she was eating her cashews after church and it started to hurt in the right side of her neck. She has a small area of swelling to her right neck

## 2013-04-03 LAB — AMYLASE: Amylase: 155 U/L — ABNORMAL HIGH (ref 0–105)

## 2013-04-04 NOTE — ED Provider Notes (Signed)
Medical screening examination/treatment/procedure(s) were performed by non-physician practitioner and as supervising physician I was immediately available for consultation/collaboration.   Hurman Horn, MD 04/04/13 2055

## 2013-04-12 ENCOUNTER — Encounter (INDEPENDENT_AMBULATORY_CARE_PROVIDER_SITE_OTHER): Payer: Self-pay | Admitting: Surgery

## 2013-04-12 ENCOUNTER — Ambulatory Visit (INDEPENDENT_AMBULATORY_CARE_PROVIDER_SITE_OTHER): Payer: Commercial Managed Care - PPO | Admitting: Surgery

## 2013-04-12 VITALS — BP 138/78 | HR 68 | Temp 97.9°F | Resp 14 | Ht 64.0 in | Wt 218.4 lb

## 2013-04-12 DIAGNOSIS — Z9884 Bariatric surgery status: Secondary | ICD-10-CM | POA: Diagnosis not present

## 2013-04-12 NOTE — Patient Instructions (Signed)
Thanks for your patience.  If you need further assistance after leaving the office, please call our office and speak with a [facility] nurse.  [phone].  If you want to leave a message for Dr. Gale Hulse, please call his office phone at [phone]. 

## 2013-04-12 NOTE — Progress Notes (Signed)
Lapband Fill Encounter Problem List:   Patient Active Problem List   Diagnosis Date Noted  . Lapband APS + hiatus hernia repair March 2014 11/23/2012  . Arthritis-bilateral hip replacements 11/10/2012  . Obesity 01/22/2012    Damiana A Chmiel Body mass index is 37.47 kg/(m^2). Weight loss since surgery  40  Having regurgitation?:  no  Feel that they need a fill?  no  Nocturnal reflux?  no  Amount of fill  0     Instructions given and weight loss goals discussed.    She is feeling restriction and continues to lose weight .  Will see back in 2 months  Matt B. Daphine Deutscher, MD, FACS

## 2013-05-03 ENCOUNTER — Encounter: Payer: 59 | Attending: Surgery | Admitting: Dietician

## 2013-05-03 ENCOUNTER — Encounter: Payer: Self-pay | Admitting: Dietician

## 2013-05-03 VITALS — Ht 64.0 in | Wt 213.0 lb

## 2013-05-03 DIAGNOSIS — Z9884 Bariatric surgery status: Secondary | ICD-10-CM | POA: Insufficient documentation

## 2013-05-03 DIAGNOSIS — Z09 Encounter for follow-up examination after completed treatment for conditions other than malignant neoplasm: Secondary | ICD-10-CM | POA: Insufficient documentation

## 2013-05-03 DIAGNOSIS — Z713 Dietary counseling and surveillance: Secondary | ICD-10-CM | POA: Insufficient documentation

## 2013-05-03 DIAGNOSIS — E669 Obesity, unspecified: Secondary | ICD-10-CM

## 2013-05-03 NOTE — Patient Instructions (Addendum)
Goals:  Follow Phase 3B: High Protein + Non-Starchy Vegetables  Increase lean protein foods to meet 60-80g goal  Increase fluid intake to 64oz +  May add 15 grams of carbohydrate (fruit, whole grain) with meals  Always have protein with carbs!!  Aim for >30 min of physical activity daily  Take calcium citrate and other supplements consistently

## 2013-05-03 NOTE — Progress Notes (Signed)
Follow-up visit:  6 Month Post-Operative LAGB Surgery  Medical Nutrition Therapy:  Appt start time: 245  End time: 315.  Primary concerns today: Post-operative Bariatric Surgery Nutrition Management. Lori Beard is here for a follow up for her LAGB surgery. Was sick recently (early August) and was told that she was dehydrated. Now making more of an effort of drink water throughout the day. Not currently exercising.    Surgery date: 11/22/12 Surgery type: LAGB Start weight at [facility]: 256.5 lbs (02/05/12)  Weight today: 213 lbs Weight change:13.5 lbs Total weight lost: 43.5 lbs  Goal weight: 200 lbs % goal met: 77%  TANITA  BODY COMP RESULTS  12/06/12 01/04/13 02/01/13 05/03/13   BMI (kg/m^2) 42.2 40.7 38.9  36.6   Fat Mass (lbs) 119.0 108.5 99.5 88.5   Fat Free Mass (lbs) 123.0 125.0 127.0 124.5   Total Body Water (lbs) 90.0 91.5 93.0 91   24-hr recall: B (AM): 1 egg w/ cheese (1 oz) - 15g Snk (AM): NONE  L (PM): Salad with grilled 2 oz chicken and basalmic viniagrette - 20-25g Snk (PM):  NONE D (PM): 1/2 c beans (lima, pinto, kidney) sometimes with 6 mini hotdogs- 20-25g Snk (PM):  Cheese (1-1.5 oz) - 13g  Fluid intake:  48oz water (sometimes with SF Hawaiian Punch pkts) Estimated total protein intake: 55-75g  Medications:  No changes reported Supplementation:  Taking MVI, B12. Only getting 1 tbsp [place] calcium (500 mg) a day     Using straws: No Drinking while eating: sometimes  Hair loss: No Carbonated beverages:  1 Coke Zero on vacation N/V/D/C:  Had diarrhea earlier this month when dehydrated, has resolved Last Lap-Band fill:  02/22/13 - added 1.5 cc  Recent physical activity:  Walks a little (about once per week); working on increasing  Progress Towards Goal(s):  In progress.   Nutritional Diagnosis:  Lori Beard Overweight/obesity related to past poor dietary habits and physical inactivity as evidenced by patient w/ recent LAGB surgery following dietary guidelines for continued  weight loss.    Intervention:  Nutrition education/diet advancement.  Monitoring/Evaluation:  Dietary intake, exercise, lap band fills, and body weight. Follow up in 3 months for 9 month post-op visit.

## 2013-06-06 ENCOUNTER — Encounter (INDEPENDENT_AMBULATORY_CARE_PROVIDER_SITE_OTHER): Payer: Self-pay

## 2013-06-15 ENCOUNTER — Ambulatory Visit (INDEPENDENT_AMBULATORY_CARE_PROVIDER_SITE_OTHER): Payer: Commercial Managed Care - PPO | Admitting: Surgery

## 2013-06-15 ENCOUNTER — Encounter (INDEPENDENT_AMBULATORY_CARE_PROVIDER_SITE_OTHER): Payer: Self-pay | Admitting: Surgery

## 2013-06-15 ENCOUNTER — Encounter (INDEPENDENT_AMBULATORY_CARE_PROVIDER_SITE_OTHER): Payer: Self-pay

## 2013-06-15 VITALS — BP 150/100 | HR 72 | Temp 98.7°F | Resp 14 | Ht 63.5 in | Wt 215.0 lb

## 2013-06-15 DIAGNOSIS — Z9884 Bariatric surgery status: Secondary | ICD-10-CM | POA: Diagnosis not present

## 2013-06-15 NOTE — Patient Instructions (Signed)

## 2013-06-15 NOTE — Progress Notes (Signed)
Lapband Fill Encounter  We discussed how she is doing with her lap band. She is not having any reflux and we did repair hiatal hernia so that's doing well. Her weight has come down to 2:15 2 she has lost 42.6 pounds since it was implanted back in March. She is the only food intolerance she has as broccoli. We discussed whether or not we should a lap band fill I think she wanted little bit more in her band maker little more. Problem List:   Patient Active Problem List   Diagnosis Date Noted  . Lapband APS + hiatus hernia repair March 2014 11/23/2012  . Arthritis-bilateral hip replacements 11/10/2012  . Obesity 01/22/2012    Adalie A Oommen Body mass index is 37.48 kg/(m^2). Weight loss since surgery  42.6 pounds  Having regurgitation?:  no  Feel that they need a fill? yes  Nocturnal reflux?  no  Amount of fill  0.3     Instructions given and weight loss goals discussed.    She was able to drink water okay for her we discussed carbohydrate avoidance. See her back in the band fill  clinic in 2 months.  Matt B. Daphine Deutscher, MD, FACS

## 2013-07-30 ENCOUNTER — Emergency Department (HOSPITAL_COMMUNITY)
Admission: EM | Admit: 2013-07-30 | Discharge: 2013-07-30 | Disposition: A | Discharge status: Home / Self Care | Payer: 59 | Attending: Emergency Medicine | Admitting: Emergency Medicine

## 2013-07-30 ENCOUNTER — Encounter (HOSPITAL_COMMUNITY): Payer: Self-pay | Admitting: Emergency Medicine

## 2013-07-30 DIAGNOSIS — K219 Gastro-esophageal reflux disease without esophagitis: Secondary | ICD-10-CM | POA: Insufficient documentation

## 2013-07-30 DIAGNOSIS — G43909 Migraine, unspecified, not intractable, without status migrainosus: Secondary | ICD-10-CM | POA: Insufficient documentation

## 2013-07-30 DIAGNOSIS — Z79899 Other long term (current) drug therapy: Secondary | ICD-10-CM | POA: Insufficient documentation

## 2013-07-30 DIAGNOSIS — R42 Dizziness and giddiness: Secondary | ICD-10-CM | POA: Insufficient documentation

## 2013-07-30 DIAGNOSIS — M129 Arthropathy, unspecified: Secondary | ICD-10-CM | POA: Insufficient documentation

## 2013-07-30 DIAGNOSIS — R509 Fever, unspecified: Secondary | ICD-10-CM

## 2013-07-30 DIAGNOSIS — R5381 Other malaise: Secondary | ICD-10-CM | POA: Insufficient documentation

## 2013-07-30 DIAGNOSIS — Z7982 Long term (current) use of aspirin: Secondary | ICD-10-CM | POA: Insufficient documentation

## 2013-07-30 DIAGNOSIS — I10 Essential (primary) hypertension: Secondary | ICD-10-CM | POA: Insufficient documentation

## 2013-07-30 DIAGNOSIS — R519 Headache, unspecified: Secondary | ICD-10-CM

## 2013-07-30 LAB — CBC
HCT: 37.3 % (ref 36.0–46.0)
MCHC: 32.2 g/dL (ref 30.0–36.0)
Platelets: 165 10*3/uL (ref 150–400)
RDW: 14.6 % (ref 11.5–15.5)
WBC: 12.8 10*3/uL — ABNORMAL HIGH (ref 4.0–10.5)

## 2013-07-30 LAB — BASIC METABOLIC PANEL
Chloride: 101 mEq/L (ref 96–112)
GFR calc Af Amer: 84 mL/min — ABNORMAL LOW (ref >90)
GFR calc non Af Amer: 72 mL/min — ABNORMAL LOW (ref >90)
Potassium: 3.6 mEq/L (ref 3.5–5.1)
Sodium: 137 mEq/L (ref 135–145)

## 2013-07-30 LAB — CG4 I-STAT (LACTIC ACID): Lactic Acid, Venous: 1.62 mmol/L (ref 0.5–2.2)

## 2013-07-30 LAB — URINALYSIS, ROUTINE W REFLEX MICROSCOPIC
Hgb urine dipstick: NEGATIVE
Nitrite: NEGATIVE
Protein, ur: NEGATIVE mg/dL
Specific Gravity, Urine: 1.015 (ref 1.005–1.030)
Urobilinogen, UA: 1 mg/dL (ref 0.0–1.0)

## 2013-07-30 LAB — URINE MICROSCOPIC-ADD ON

## 2013-07-30 MED ORDER — SODIUM CHLORIDE 0.9 % IV BOLUS (SEPSIS)
1000.0000 mL | Freq: Once | INTRAVENOUS | Status: AC
  Administered 2013-07-30: 1000 mL via INTRAVENOUS

## 2013-07-30 MED ORDER — METOCLOPRAMIDE HCL 5 MG/ML IJ SOLN
10.0000 mg | Freq: Once | INTRAMUSCULAR | Status: AC
  Administered 2013-07-30: 10 mg via INTRAVENOUS
  Filled 2013-07-30: qty 2

## 2013-07-30 MED ORDER — ACETAMINOPHEN 325 MG PO TABS
650.0000 mg | ORAL_TABLET | Freq: Four times a day (QID) | ORAL | Status: DC | PRN
  Administered 2013-07-30: 650 mg via ORAL
  Filled 2013-07-30: qty 2

## 2013-07-30 MED ORDER — KETOROLAC TROMETHAMINE 30 MG/ML IJ SOLN
30.0000 mg | Freq: Once | INTRAMUSCULAR | Status: AC
  Administered 2013-07-30: 30 mg via INTRAVENOUS
  Filled 2013-07-30: qty 1

## 2013-07-30 MED ORDER — DIPHENHYDRAMINE HCL 50 MG/ML IJ SOLN
25.0000 mg | Freq: Once | INTRAMUSCULAR | Status: AC
  Administered 2013-07-30: 25 mg via INTRAVENOUS
  Filled 2013-07-30: qty 1

## 2013-07-30 NOTE — ED Provider Notes (Signed)
62 year old female comes in with onset today of fever and headache. She denies rhinorrhea, sore throat, cough, nausea, vomiting, diarrhea, dysuria. She denies arthralgias or myalgias. She denies any sick contacts. She denies stiff neck. On exam, she is resting comfortably and in no distress. She does not appear toxic. Vital signs are significant for fever which has come down with acetaminophen. However, headache has not improved. On exam, neck is supple. Lungs are clear. Heart has regular rate. Abdomen is soft and nontender. She will be given headache cocktail to see if she can get additional relief. At this point, she does not have any clinical findings to suspect meningitis.  I saw and evaluated the patient, reviewed the resident's note and I agree with the findings and plan.   Dione Booze, MD 07/30/13 [phone]

## 2013-07-30 NOTE — ED Provider Notes (Signed)
CSN: [id number]     Arrival date & time 07/30/13  1535 History   First MD Initiated Contact with Patient 07/30/13 1606     Chief Complaint  Patient presents with  . Dizziness  . Headache   (Consider location/radiation/quality/duration/timing/severity/associated sxs/prior Treatment) HPI Lori Beard is a 62 y.o. female who presented to the emergency department for concern of a headache and feeling lightheaded.  Patient reports that she has a history of migraines and started having a headache yesterday that was similar to numerous previous.  Moderate in severity.  Located in bilateral front of head.  No radiation.  No alleviating/aagrvating factors.  Then today she was at church and just felt as if she was going to fall down.  No LOC.  No actual fall.  Felt weak all over.  Decided she better come in here to be checked out.  Now actually feeling somewhat better.    Past Medical History  Diagnosis Date  . Hypertension   . Morbid obesity   . Shortness of breath     ONLY WITH EXERTION  . GERD (gastroesophageal reflux disease)   . Arthritis     ALL OVER  . Sleep apnea 02/15/2012    SLEEP STUDY IN EPIC - MILD OSA-CPAP NOT RECOMMENDED-HOME OXYGEN SUGGESTED BECAUSE OF OXYGEN DESATS ON ROOM AIR.   Past Surgical History  Procedure Laterality Date  . Abdominal hysterectomy    . Knee arthroscopy    . Cholecystectomy    . Breath tek h pylori  02/15/2012    Procedure: BREATH TEK H PYLORI;  Surgeon: Valarie Merino, MD;  Location: [facility];  Service: General;  Laterality: N/A;  745  . Joint replacement      BILATERAL TOTAL HIP REPLACEMENTS  . Mesh applied to lap port N/A 11/22/2012    Procedure: MESH APPLIED TO LAP PORT;  Surgeon: Valarie Merino, MD;  Location: [facility];  Service: General;  Laterality: N/A;  . Laparoscopic gastric banding     Family History  Problem Relation Age of Onset  . Kidney disease Mother   . Heart disease Father   . Cancer Father     colon  . Cancer Sister     breast   History  Substance Use Topics  . Smoking status: Never Smoker   . Smokeless tobacco: Never Used  . Alcohol Use: No   OB History   Grav Para Term Preterm Abortions TAB SAB Ect Mult Living                 Review of Systems  Constitutional: Negative for fever and chills.  HENT: Negative for congestion and rhinorrhea.   Respiratory: Negative for cough and shortness of breath.   Cardiovascular: Negative for chest pain.  Gastrointestinal: Negative for nausea, vomiting, abdominal pain, diarrhea and abdominal distention.  Endocrine: Negative for polyuria.  Genitourinary: Negative for dysuria.  Musculoskeletal: Negative for neck pain and neck stiffness.  Skin: Negative for rash.  Neurological: Positive for light-headedness and headaches.  Psychiatric/Behavioral: Negative.     Allergies  Ace inhibitors; Iron; and Morphine and related  Home Medications   Current Outpatient Rx  Name  Route  Sig  Dispense  Refill  . amitriptyline (ELAVIL) 25 MG tablet   Oral   Take 75 mg by mouth at bedtime.         [name] aspirin 325 MG tablet   Oral   Take 325 mg by mouth every morning.          [name]  furosemide (LASIX) 40 MG tablet   Oral   Take 40 mg by mouth every morning.          [name] HYDROcodone-acetaminophen (NORCO/VICODIN) 5-325 MG per tablet   Oral   Take 2 tablets by mouth every 6 (six) hours as needed for pain.   20 tablet   0   . pantoprazole (PROTONIX) 40 MG tablet   Oral   Take 40 mg by mouth every morning.          [name] PRESCRIPTION MEDICATION   Oral   Take 1 tablet by mouth 2 (two) times daily. Pt take a "unknown blood pressure medication" her pharmacy is closed at this time         . verapamil (CALAN-SR) 240 MG CR tablet   Oral   Take 240 mg by mouth every morning.          BP 130/60  Pulse 60  Temp(Src) 99.3 F (37.4 C) (Oral)  Resp 20  Ht 5' 3.5" (1.613 m)  Wt 213 lb (96.616 kg)  BMI 37.13 kg/m2  SpO2 99% Physical Exam  Nursing note and vitals  reviewed. Constitutional: She is oriented to person, place, and time. She appears well-developed and well-nourished. No distress.  HENT:  Head: Normocephalic and atraumatic.  Right Ear: External ear normal.  Left Ear: External ear normal.  Nose: Nose normal.  Mouth/Throat: Oropharynx is clear and moist. No oropharyngeal exudate.  Eyes: EOM are normal. Pupils are equal, round, and reactive to light.  Neck: Normal range of motion. Neck supple. No tracheal deviation present.  Cardiovascular: Normal rate.   Pulmonary/Chest: Effort normal and breath sounds normal. No stridor. No respiratory distress. She has no wheezes. She has no rales.  Abdominal: Soft. She exhibits no distension. There is no tenderness. There is no rebound.  Musculoskeletal: Normal range of motion.  Neurological: She is alert and oriented to person, place, and time. She has normal strength and normal reflexes. No cranial nerve deficit or sensory deficit. She displays a negative Romberg sign. Coordination normal. GCS eye subscore is 4. GCS verbal subscore is 5. GCS motor subscore is 6.  Skin: Skin is warm and dry. She is not diaphoretic.    ED Course  Procedures (including critical care time) Labs Review Labs Reviewed  CBC - Abnormal; Notable for the following:    WBC 12.8 (*)    All other components within normal limits  BASIC METABOLIC PANEL - Abnormal; Notable for the following:    Glucose, Bld 123 (*)    GFR calc non Af Amer 72 (*)    GFR calc Af Amer 84 (*)    All other components within normal limits  URINALYSIS, ROUTINE W REFLEX MICROSCOPIC - Abnormal; Notable for the following:    Leukocytes, UA TRACE (*)    All other components within normal limits  URINE MICROSCOPIC-ADD ON - Abnormal; Notable for the following:    Squamous Epithelial / LPF FEW (*)    All other components within normal limits  CG4 I-STAT (LACTIC ACID)   Imaging Review No results found.  EKG Interpretation   None       MDM   1.  Headache   2. Fever     Lori Beard is a 62 y.o. female who presented to the emergency department for concern of headache and lightheadedness.  On exam, patient with temperature to 103.  Patient repeatedly evaluated and with no meningismus.  No evidence of pneumonia.  No UTI.  Fever likely early virus.  Headache and fevers treated and patient feeling back to baseline.  Given age, recommend f/u with PCP tomorrow for repeat evaluation.  Patient safe for discharge home.  Patient discharged.    Arloa Koh, MD 07/31/13 [phone]

## 2013-07-30 NOTE — ED Notes (Addendum)
Pt c/o headache and dizziness for a couple of days, also possible fever. Pt denies n/v. Pt with temp of 103 in triage. No facial droop, grips equally B/L. No slurred speech. Pt also reports neck pain this morning.

## 2013-07-31 ENCOUNTER — Encounter (HOSPITAL_COMMUNITY): Payer: Self-pay | Admitting: Emergency Medicine

## 2013-07-31 DIAGNOSIS — J189 Pneumonia, unspecified organism: Principal | ICD-10-CM | POA: Diagnosis present

## 2013-07-31 DIAGNOSIS — E876 Hypokalemia: Secondary | ICD-10-CM | POA: Diagnosis present

## 2013-07-31 DIAGNOSIS — M129 Arthropathy, unspecified: Secondary | ICD-10-CM | POA: Diagnosis present

## 2013-07-31 DIAGNOSIS — Z96649 Presence of unspecified artificial hip joint: Secondary | ICD-10-CM

## 2013-07-31 DIAGNOSIS — K219 Gastro-esophageal reflux disease without esophagitis: Secondary | ICD-10-CM | POA: Diagnosis present

## 2013-07-31 DIAGNOSIS — I4901 Ventricular fibrillation: Secondary | ICD-10-CM | POA: Diagnosis not present

## 2013-07-31 DIAGNOSIS — Z6839 Body mass index (BMI) 39.0-39.9, adult: Secondary | ICD-10-CM

## 2013-07-31 DIAGNOSIS — I2489 Other forms of acute ischemic heart disease: Secondary | ICD-10-CM | POA: Diagnosis not present

## 2013-07-31 DIAGNOSIS — D638 Anemia in other chronic diseases classified elsewhere: Secondary | ICD-10-CM | POA: Diagnosis present

## 2013-07-31 DIAGNOSIS — I428 Other cardiomyopathies: Secondary | ICD-10-CM | POA: Diagnosis present

## 2013-07-31 DIAGNOSIS — I4949 Other premature depolarization: Secondary | ICD-10-CM | POA: Diagnosis not present

## 2013-07-31 DIAGNOSIS — R6510 Systemic inflammatory response syndrome (SIRS) of non-infectious origin without acute organ dysfunction: Secondary | ICD-10-CM | POA: Diagnosis present

## 2013-07-31 DIAGNOSIS — I469 Cardiac arrest, cause unspecified: Secondary | ICD-10-CM | POA: Diagnosis not present

## 2013-07-31 DIAGNOSIS — Z7982 Long term (current) use of aspirin: Secondary | ICD-10-CM

## 2013-07-31 DIAGNOSIS — E860 Dehydration: Secondary | ICD-10-CM | POA: Diagnosis present

## 2013-07-31 DIAGNOSIS — D696 Thrombocytopenia, unspecified: Secondary | ICD-10-CM | POA: Diagnosis not present

## 2013-07-31 DIAGNOSIS — I472 Ventricular tachycardia, unspecified: Secondary | ICD-10-CM | POA: Diagnosis not present

## 2013-07-31 DIAGNOSIS — E875 Hyperkalemia: Secondary | ICD-10-CM | POA: Diagnosis not present

## 2013-07-31 DIAGNOSIS — I248 Other forms of acute ischemic heart disease: Secondary | ICD-10-CM | POA: Diagnosis not present

## 2013-07-31 DIAGNOSIS — R0902 Hypoxemia: Secondary | ICD-10-CM | POA: Diagnosis present

## 2013-07-31 DIAGNOSIS — Z79899 Other long term (current) drug therapy: Secondary | ICD-10-CM

## 2013-07-31 DIAGNOSIS — N179 Acute kidney failure, unspecified: Secondary | ICD-10-CM | POA: Diagnosis present

## 2013-07-31 DIAGNOSIS — Z9884 Bariatric surgery status: Secondary | ICD-10-CM

## 2013-07-31 DIAGNOSIS — G4733 Obstructive sleep apnea (adult) (pediatric): Secondary | ICD-10-CM | POA: Diagnosis present

## 2013-07-31 DIAGNOSIS — I10 Essential (primary) hypertension: Secondary | ICD-10-CM | POA: Diagnosis present

## 2013-07-31 DIAGNOSIS — I4729 Other ventricular tachycardia: Secondary | ICD-10-CM | POA: Diagnosis not present

## 2013-07-31 NOTE — ED Notes (Signed)
Pt. reports persistent headache with slight dizziness for several days , denies injury , seen here yesterday for the same complaint . Denies nausea or vomitting . Alert and oriented.

## 2013-08-01 ENCOUNTER — Emergency Department (HOSPITAL_COMMUNITY): Payer: 59

## 2013-08-01 ENCOUNTER — Other Ambulatory Visit: Payer: Self-pay

## 2013-08-01 ENCOUNTER — Encounter (HOSPITAL_COMMUNITY): Payer: Self-pay | Admitting: Radiology

## 2013-08-01 ENCOUNTER — Inpatient Hospital Stay (HOSPITAL_COMMUNITY)
Admission: EM | Admit: 2013-08-01 | Discharge: 2013-08-05 | DRG: 981 | Disposition: A | Discharge status: Home / Self Care | Payer: 59 | Attending: Internal Medicine | Admitting: Internal Medicine

## 2013-08-01 DIAGNOSIS — J189 Pneumonia, unspecified organism: Secondary | ICD-10-CM

## 2013-08-01 DIAGNOSIS — I472 Ventricular tachycardia, unspecified: Secondary | ICD-10-CM

## 2013-08-01 DIAGNOSIS — I517 Cardiomegaly: Secondary | ICD-10-CM

## 2013-08-01 DIAGNOSIS — I469 Cardiac arrest, cause unspecified: Secondary | ICD-10-CM

## 2013-08-01 DIAGNOSIS — R0902 Hypoxemia: Secondary | ICD-10-CM

## 2013-08-01 DIAGNOSIS — N179 Acute kidney failure, unspecified: Secondary | ICD-10-CM

## 2013-08-01 DIAGNOSIS — E876 Hypokalemia: Secondary | ICD-10-CM

## 2013-08-01 DIAGNOSIS — E669 Obesity, unspecified: Secondary | ICD-10-CM

## 2013-08-01 DIAGNOSIS — I10 Essential (primary) hypertension: Secondary | ICD-10-CM

## 2013-08-01 DIAGNOSIS — D696 Thrombocytopenia, unspecified: Secondary | ICD-10-CM

## 2013-08-01 DIAGNOSIS — Z9884 Bariatric surgery status: Secondary | ICD-10-CM

## 2013-08-01 DIAGNOSIS — M1990 Unspecified osteoarthritis, unspecified site: Secondary | ICD-10-CM

## 2013-08-01 DIAGNOSIS — D649 Anemia, unspecified: Secondary | ICD-10-CM

## 2013-08-01 DIAGNOSIS — I428 Other cardiomyopathies: Secondary | ICD-10-CM

## 2013-08-01 DIAGNOSIS — R7989 Other specified abnormal findings of blood chemistry: Secondary | ICD-10-CM

## 2013-08-01 HISTORY — DX: Pneumonia, unspecified organism: J18.9

## 2013-08-01 HISTORY — DX: Cardiac arrest, cause unspecified: I46.9

## 2013-08-01 HISTORY — DX: Acute kidney failure, unspecified: N17.9

## 2013-08-01 LAB — TROPONIN I
Troponin I: 0.84 ng/mL (ref <0.30)
Troponin I: 0.57 ng/mL (ref <0.30)
Troponin I: 0.73 ng/mL (ref <0.30)
Troponin I: 0.49 ng/mL (ref <0.30)

## 2013-08-01 LAB — HAPTOGLOBIN: Haptoglobin: 204 mg/dL — ABNORMAL HIGH (ref 45–215)

## 2013-08-01 LAB — BASIC METABOLIC PANEL
BUN: 21 mg/dL (ref 6–23)
CO2: 26 mEq/L (ref 19–32)
CO2: 26 mEq/L (ref 19–32)
Calcium: 8.7 mg/dL (ref 8.4–10.5)
Chloride: 98 mEq/L (ref 96–112)
Chloride: 99 mEq/L (ref 96–112)
GFR calc Af Amer: 70 mL/min — ABNORMAL LOW (ref >90)
GFR calc non Af Amer: 50 mL/min — ABNORMAL LOW (ref >90)
GFR calc non Af Amer: 60 mL/min — ABNORMAL LOW (ref >90)
Sodium: 134 mEq/L — ABNORMAL LOW (ref 135–145)
Sodium: 134 mEq/L — ABNORMAL LOW (ref 135–145)

## 2013-08-01 LAB — LACTIC ACID, PLASMA: Lactic Acid, Venous: 0.9 mmol/L (ref 0.5–2.2)

## 2013-08-01 LAB — CBC WITH DIFFERENTIAL/PLATELET
Basophils Absolute: 0 10*3/uL (ref 0.0–0.1)
Basophils Absolute: 0 10*3/uL (ref 0.0–0.1)
Basophils Relative: 0 % (ref 0–1)
Eosinophils Absolute: 0 10*3/uL (ref 0.0–0.7)
Eosinophils Relative: 0 % (ref 0–5)
Eosinophils Relative: 0 % (ref 0–5)
HCT: 32.8 % — ABNORMAL LOW (ref 36.0–46.0)
Hemoglobin: 10.4 g/dL — ABNORMAL LOW (ref 12.0–15.0)
Lymphocytes Relative: 12 % (ref 12–46)
Lymphs Abs: 2 10*3/uL (ref 0.7–4.0)
Lymphs Abs: 1.6 10*3/uL (ref 0.7–4.0)
MCV: 81.4 fL (ref 78.0–100.0)
Monocytes Relative: 4 % (ref 3–12)
Monocytes Relative: 4 % (ref 3–12)
Neutro Abs: 14.1 10*3/uL — ABNORMAL HIGH (ref 1.7–7.7)
Neutrophils Relative %: 85 % — ABNORMAL HIGH (ref 43–77)
Platelets: 146 10*3/uL — ABNORMAL LOW (ref 150–400)
Platelets: 156 10*3/uL (ref 150–400)
RBC: 3.91 MIL/uL (ref 3.87–5.11)
RDW: 14.8 % (ref 11.5–15.5)
RDW: 14.7 % (ref 11.5–15.5)
WBC Morphology: INCREASED
WBC: 16.8 10*3/uL — ABNORMAL HIGH (ref 4.0–10.5)
WBC: 14.2 10*3/uL — ABNORMAL HIGH (ref 4.0–10.5)

## 2013-08-01 LAB — URINE MICROSCOPIC-ADD ON

## 2013-08-01 LAB — URINALYSIS, ROUTINE W REFLEX MICROSCOPIC
Glucose, UA: NEGATIVE mg/dL
Protein, ur: 100 mg/dL — AB
Urobilinogen, UA: 2 mg/dL — ABNORMAL HIGH (ref 0.0–1.0)

## 2013-08-01 LAB — COMPREHENSIVE METABOLIC PANEL
ALT: 11 U/L (ref 0–35)
BUN: 19 mg/dL (ref 6–23)
CO2: 25 mEq/L (ref 19–32)
Chloride: 99 mEq/L (ref 96–112)
Creatinine, Ser: 0.98 mg/dL (ref 0.50–1.10)
GFR calc Af Amer: 70 mL/min — ABNORMAL LOW (ref >90)
GFR calc non Af Amer: 60 mL/min — ABNORMAL LOW (ref >90)
Glucose, Bld: 112 mg/dL — ABNORMAL HIGH (ref 70–99)
Total Bilirubin: 0.9 mg/dL (ref 0.3–1.2)

## 2013-08-01 LAB — LACTATE DEHYDROGENASE: LDH: 135 U/L (ref 94–250)

## 2013-08-01 LAB — PRO B NATRIURETIC PEPTIDE: Pro B Natriuretic peptide (BNP): 2445 pg/mL — ABNORMAL HIGH (ref 0–125)

## 2013-08-01 LAB — MAGNESIUM: Magnesium: 1.8 mg/dL (ref 1.5–2.5)

## 2013-08-01 LAB — INFLUENZA PANEL BY PCR (TYPE A & B): Influenza A By PCR: NEGATIVE

## 2013-08-01 MED ORDER — ACETAMINOPHEN 650 MG RE SUPP: 650.0000 mg | Freq: Four times a day (QID) | RECTAL | Status: DC | PRN

## 2013-08-01 MED ORDER — VERAPAMIL HCL ER 240 MG PO TBCR
240.0000 mg | EXTENDED_RELEASE_TABLET | Freq: Every morning | ORAL | Status: DC
  Filled 2013-08-01: qty 1

## 2013-08-01 MED ORDER — SODIUM CHLORIDE 0.9 % IV SOLN: INTRAVENOUS | Status: DC | PRN

## 2013-08-01 MED ORDER — ONDANSETRON HCL 4 MG PO TABS: 4.0000 mg | ORAL_TABLET | Freq: Four times a day (QID) | ORAL | Status: DC | PRN

## 2013-08-01 MED ORDER — DEXTROSE 5 % IV SOLN
1.0000 g | Freq: Once | INTRAVENOUS | Status: AC
  Administered 2013-08-01: 1 g via INTRAVENOUS
  Filled 2013-08-01: qty 10

## 2013-08-01 MED ORDER — ASPIRIN 81 MG PO CHEW
243.0000 mg | CHEWABLE_TABLET | ORAL | Status: AC
  Administered 2013-08-01: 243 mg via ORAL

## 2013-08-01 MED ORDER — POTASSIUM CHLORIDE CRYS ER 20 MEQ PO TBCR
40.0000 meq | EXTENDED_RELEASE_TABLET | Freq: Once | ORAL | Status: AC
  Administered 2013-08-01: 40 meq via ORAL
  Filled 2013-08-01: qty 2

## 2013-08-01 MED ORDER — ACETAMINOPHEN 325 MG PO TABS: 650.0000 mg | ORAL_TABLET | Freq: Four times a day (QID) | ORAL | Status: DC | PRN

## 2013-08-01 MED ORDER — SODIUM CHLORIDE 0.9 % IV SOLN
INTRAVENOUS | Status: AC
  Administered 2013-08-01: 13:00:00 via INTRAVENOUS

## 2013-08-01 MED ORDER — HYDROCODONE-ACETAMINOPHEN 5-325 MG PO TABS
2.0000 | ORAL_TABLET | Freq: Four times a day (QID) | ORAL | Status: DC | PRN
  Administered 2013-08-01 – 2013-08-05 (×3): 2 via ORAL
  Filled 2013-08-01 (×3): qty 2

## 2013-08-01 MED ORDER — DEXTROSE 5 % IV SOLN
500.0000 mg | INTRAVENOUS | Status: DC
  Administered 2013-08-02: 500 mg via INTRAVENOUS
  Filled 2013-08-01 (×2): qty 500

## 2013-08-01 MED ORDER — METOPROLOL TARTRATE 50 MG PO TABS
50.0000 mg | ORAL_TABLET | Freq: Two times a day (BID) | ORAL | Status: DC
  Administered 2013-08-01 – 2013-08-02 (×2): 50 mg via ORAL
  Filled 2013-08-01 (×4): qty 1

## 2013-08-01 MED ORDER — IOHEXOL 350 MG/ML SOLN
80.0000 mL | Freq: Once | INTRAVENOUS | Status: AC | PRN
  Administered 2013-08-01: 80 mL via INTRAVENOUS

## 2013-08-01 MED ORDER — DEXTROSE 5 % IV SOLN
1.0000 g | INTRAVENOUS | Status: DC
  Administered 2013-08-02 – 2013-08-05 (×4): 1 g via INTRAVENOUS
  Filled 2013-08-01 (×4): qty 10

## 2013-08-01 MED ORDER — ASPIRIN 81 MG PO CHEW
81.0000 mg | CHEWABLE_TABLET | Freq: Every day | ORAL | Status: DC
  Administered 2013-08-01 – 2013-08-05 (×4): 81 mg via ORAL
  Filled 2013-08-01 (×5): qty 1

## 2013-08-01 MED ORDER — VERAPAMIL HCL ER 120 MG PO TBCR: 120.0000 mg | EXTENDED_RELEASE_TABLET | Freq: Every day | ORAL | Status: DC

## 2013-08-01 MED ORDER — SODIUM CHLORIDE 0.9 % IV SOLN
INTRAVENOUS | Status: DC
  Administered 2013-08-01: 07:00:00 via INTRAVENOUS

## 2013-08-01 MED ORDER — METOPROLOL TARTRATE 12.5 MG HALF TABLET
12.5000 mg | ORAL_TABLET | Freq: Two times a day (BID) | ORAL | Status: DC
  Administered 2013-08-01: 12.5 mg via ORAL
  Filled 2013-08-01 (×2): qty 1

## 2013-08-01 MED ORDER — ASPIRIN 81 MG PO CHEW
81.0000 mg | CHEWABLE_TABLET | ORAL | Status: AC
  Administered 2013-08-02: 81 mg via ORAL
  Filled 2013-08-01: qty 1

## 2013-08-01 MED ORDER — ENOXAPARIN SODIUM 40 MG/0.4ML ~~LOC~~ SOLN
40.0000 mg | SUBCUTANEOUS | Status: DC
  Administered 2013-08-02 – 2013-08-03 (×2): 40 mg via SUBCUTANEOUS
  Filled 2013-08-01 (×7): qty 0.4

## 2013-08-01 MED ORDER — MAGNESIUM SULFATE IN D5W 10-5 MG/ML-% IV SOLN
1.0000 g | Freq: Once | INTRAVENOUS | Status: AC
  Administered 2013-08-01: 13:00:00 1 g via INTRAVENOUS
  Filled 2013-08-01: qty 100

## 2013-08-01 MED ORDER — ENSURE COMPLETE PO LIQD
237.0000 mL | Freq: Every morning | ORAL | Status: DC
  Administered 2013-08-03 – 2013-08-05 (×2): 237 mL via ORAL
  Filled 2013-08-01 (×2): qty 237

## 2013-08-01 MED ORDER — SODIUM CHLORIDE 0.9 % IV SOLN: 250.0000 mL | INTRAVENOUS | Status: DC | PRN

## 2013-08-01 MED ORDER — AMIODARONE HCL IN DEXTROSE 360-4.14 MG/200ML-% IV SOLN
30.0000 mg/h | INTRAVENOUS | Status: DC
  Administered 2013-08-02 – 2013-08-03 (×4): 30 mg/h via INTRAVENOUS
  Filled 2013-08-01 (×14): qty 200

## 2013-08-01 MED ORDER — PANTOPRAZOLE SODIUM 40 MG PO TBEC
40.0000 mg | DELAYED_RELEASE_TABLET | Freq: Every morning | ORAL | Status: DC
  Administered 2013-08-01 – 2013-08-05 (×5): 40 mg via ORAL
  Filled 2013-08-01 (×5): qty 1

## 2013-08-01 MED ORDER — POTASSIUM CHLORIDE 10 MEQ/100ML IV SOLN
10.0000 meq | INTRAVENOUS | Status: AC
  Administered 2013-08-01 (×4): 10 meq via INTRAVENOUS
  Filled 2013-08-01 (×4): qty 100

## 2013-08-01 MED ORDER — AMITRIPTYLINE HCL 75 MG PO TABS
75.0000 mg | ORAL_TABLET | Freq: Every day | ORAL | Status: DC
  Administered 2013-08-01 – 2013-08-04 (×4): 75 mg via ORAL
  Filled 2013-08-01 (×5): qty 1

## 2013-08-01 MED ORDER — SODIUM CHLORIDE 0.9 % IV SOLN: INTRAVENOUS | Status: DC

## 2013-08-01 MED ORDER — POTASSIUM CHLORIDE IN NACL 40-0.9 MEQ/L-% IV SOLN
INTRAVENOUS | Status: DC
  Administered 2013-08-01: 11:00:00 via INTRAVENOUS
  Filled 2013-08-01 (×2): qty 1000

## 2013-08-01 MED ORDER — AMIODARONE HCL IN DEXTROSE 360-4.14 MG/200ML-% IV SOLN
60.0000 mg/h | INTRAVENOUS | Status: AC
  Administered 2013-08-01 (×3): 60 mg/h via INTRAVENOUS
  Filled 2013-08-01 (×2): qty 200

## 2013-08-01 MED ORDER — AZITHROMYCIN 500 MG IV SOLR
500.0000 mg | Freq: Once | INTRAVENOUS | Status: AC
  Administered 2013-08-01: 500 mg via INTRAVENOUS

## 2013-08-01 MED ORDER — ONDANSETRON HCL 4 MG/2ML IJ SOLN
4.0000 mg | Freq: Four times a day (QID) | INTRAMUSCULAR | Status: DC | PRN
  Administered 2013-08-01: 13:00:00 4 mg via INTRAVENOUS
  Filled 2013-08-01: qty 2

## 2013-08-01 MED ORDER — ATORVASTATIN CALCIUM 10 MG PO TABS
10.0000 mg | ORAL_TABLET | Freq: Every day | ORAL | Status: DC
  Administered 2013-08-01 – 2013-08-04 (×4): 10 mg via ORAL
  Filled 2013-08-01 (×5): qty 1

## 2013-08-01 MED ORDER — AMIODARONE LOAD VIA INFUSION
150.0000 mg | Freq: Once | INTRAVENOUS | Status: AC
  Administered 2013-08-01: 150 mg via INTRAVENOUS
  Filled 2013-08-01: qty 83.34

## 2013-08-01 MED ORDER — SODIUM CHLORIDE 0.9 % IJ SOLN
3.0000 mL | Freq: Two times a day (BID) | INTRAMUSCULAR | Status: DC
  Administered 2013-08-02 (×2): 3 mL via INTRAVENOUS
  Administered 2013-08-03: 10:00:00 via INTRAVENOUS
  Administered 2013-08-03 – 2013-08-04 (×3): 3 mL via INTRAVENOUS

## 2013-08-01 NOTE — Progress Notes (Signed)
INITIAL NUTRITION ASSESSMENT  DOCUMENTATION CODES Per approved criteria  -Obesity Unspecified   INTERVENTION: Ensure Complete po once daily, each supplement provides 350 kcal and 13 grams of protein.  NUTRITION DIAGNOSIS: Inadequate oral intake related to lap band as evidenced by reported 40 lb wt loss.   Goal: Pt to meet >/= 90% of their estimated nutrition needs   Monitor:  Wt, po intake, labs, I/O's  Reason for Assessment: MST  62 y.o. female  Admitting Dx: Pneumonia  ASSESSMENT: Pt with history of hypertension has been experiencing productive cough with headache and dizziness for last 2 days. Patient did come 2 days ago to the ER (11/23) and was discharged home. Last night when patient was trying to walk she felt dizzy and fell but did not lose consciousness and was brought to the ER. Patient was complaining of mild shortness of breath with mild pleuritic type of chest pain on the left side. CT angiogram of the chest was done to rule out PE which was negative but did show pneumonic process. Patient also has been having fever with leukocytosis. Patient has been admitted for IV antibiotics for pneumonia.  Pt reports that she has had a 40 lb wt loss since undergoing lap band placement in June 2014. Her goal is to get down to 200 lbs. She reports a poor appetite since being admitted to the hospital and said that she would like to receive Ensure. Pt was educated on the fact that Ensure can cause wt gain if consumed in addition to meals. She was advised to discontinue Ensure if her appetite improves.    Height: Ht Readings from Last 1 Encounters:  08/01/13 5\' 3"  (1.6 m)    Weight: Wt Readings from Last 1 Encounters:  08/01/13 218 lb 0.6 oz (98.9 kg)    Ideal Body Weight: 52.4 kg  % Ideal Body Weight: 189%  Wt Readings from Last 10 Encounters:  08/01/13 218 lb 0.6 oz (98.9 kg)  07/30/13 213 lb (96.616 kg)  06/15/13 215 lb (97.523 kg)  05/03/13 213 lb (96.616 kg)   04/12/13 218 lb 6.4 oz (99.066 kg)  02/22/13 221 lb 9.6 oz (100.517 kg)  02/01/13 226 lb 8 oz (102.74 kg)  01/11/13 233 lb 3.2 oz (105.779 kg)  01/07/13 233 lb (105.688 kg)  01/04/13 233 lb 8 oz (105.915 kg)    BMI:  Body mass index is 38.63 kg/(m^2).  Estimated Nutritional Needs: Kcal: 1950-2150 Protein: 110-120 g Fluid: 2.0 L  Skin: WNL  Diet Order: Cardiac  EDUCATION NEEDS: -Education needs addressed   Intake/Output Summary (Last 24 hours) at 08/01/13 1059 Last data filed at 08/01/13 0751  Gross per 24 hour  Intake      0 ml  Output    250 ml  Net   -250 ml    Last BM: none recorded   Labs:   Recent Labs Lab 07/30/13 1631 08/01/13 0149 08/01/13 0830  NA 137 134* 136  K 3.6 3.1* 2.9*  CL 101 98 99  CO2 27 26 25   BUN 12 21 19   CREATININE 0.84 1.15* 0.98  CALCIUM 9.0 8.7 8.6  GLUCOSE 123* 121* 112*    CBG (last 3)  No results found for this basename: GLUCAP,  in the last 72 hours  Scheduled Meds: . amitriptyline  75 mg Oral QHS  . aspirin  81 mg Oral Daily  . atorvastatin  10 mg Oral q1800  . [START ON 08/02/2013] azithromycin  500 mg Intravenous Q24H  . [  START ON 08/02/2013] cefTRIAXone (ROCEPHIN)  IV  1 g Intravenous Q24H  . enoxaparin (LOVENOX) injection  40 mg Subcutaneous Q24H  . metoprolol tartrate  12.5 mg Oral BID  . pantoprazole  40 mg Oral q morning - 10a  . potassium chloride  40 mEq Oral Once  . sodium chloride  3 mL Intravenous Q12H  . [START ON 08/02/2013] verapamil  120 mg Oral Daily    Continuous Infusions: . 0.9 % NaCl with KCl 40 mEq / L 75 mL/hr at 08/01/13 1045    Past Medical History  Diagnosis Date  . Hypertension   . Morbid obesity   . Shortness of breath     ONLY WITH EXERTION  . GERD (gastroesophageal reflux disease)   . Arthritis     ALL OVER  . Sleep apnea 02/15/2012    SLEEP STUDY IN EPIC - MILD OSA-CPAP NOT RECOMMENDED-HOME OXYGEN SUGGESTED BECAUSE OF OXYGEN DESATS ON ROOM AIR.    Past Surgical History   Procedure Laterality Date  . Abdominal hysterectomy    . Knee arthroscopy    . Cholecystectomy    . Breath tek h pylori  02/15/2012    Procedure: BREATH TEK H PYLORI;  Surgeon: Valarie Merino, MD;  Location: [facility];  Service: General;  Laterality: N/A;  745  . Joint replacement      BILATERAL TOTAL HIP REPLACEMENTS  . Mesh applied to lap port N/A 11/22/2012    Procedure: MESH APPLIED TO LAP PORT;  Surgeon: Valarie Merino, MD;  Location: [facility];  Service: General;  Laterality: N/A;  . Laparoscopic gastric banding      Ebbie Latus RD, LDN

## 2013-08-01 NOTE — ED Notes (Signed)
Pt reports she was seen here last night for fever, weakness, and dizziness- d/c home.  Pt states that her symptoms have gotten worse today.  Pt also reports new left rib pain- denies any injury.  Lung sounds slightly diminished in lower lobes.  Pt placed on 2 L O2 due to sp02 being 88% on RA.  Currently alert and oriented X 4- states dizziness occurs when she is lying flat.

## 2013-08-01 NOTE — Progress Notes (Signed)
Lori Beard is a 62 y.o. female patient who transferred to 2H awake, alert  & orientated  X 3, Full Code, VSS - Blood pressure 148/92, pulse 92, temperature 100.1 F (37.8 C), temperature source Oral, resp. rate 18, height 5\' 3"  (1.6 m), weight 98.9 kg (218 lb 0.6 oz), SpO2 97.00%., O2   @ 3 L nasal cannular, no c/o shortness of breath, no c/o chest pain, no distress noted.    IV site WDL: antecubital left, condition patent and no redness with a transparent dsg that's clean dry and intact.  Allergies:   Allergies  Allergen Reactions  . Ace Inhibitors Swelling  . Iron Other (See Comments)    Gives patient migraine headaches when supplemented  . Morphine And Related Itching and Rash     Past Medical History  Diagnosis Date  . Hypertension   . Morbid obesity     a. s/p lapband surgery 11/2012.  [name] GERD (gastroesophageal reflux disease)   . Arthritis     ALL OVER  . Sleep apnea 02/15/2012    SLEEP STUDY IN EPIC - MILD OSA-CPAP NOT RECOMMENDED-HOME OXYGEN SUGGESTED BECAUSE OF OXYGEN DESATS ON ROOM AIR.    Report given to nurse on 2H caring for patient.   Will cont to monitor and assist as needed.  Cindra Eves, RN 08/01/2013 3:01 PM

## 2013-08-01 NOTE — ED Notes (Addendum)
Pt ambulated just outside of room... O2 sats dropped down to 80% on RA nurse and MD made aware

## 2013-08-01 NOTE — Code Documentation (Signed)
CODE BLUE NOTE  Patient Name: Lori Beard   MRN: [id number]   Date of Birth/ Sex: [birth year] , female      Admission Date: 08/01/2013  Attending Provider: Leroy Sea, MD  Primary Diagnosis: Pneumonia    Indication: Pt was in her usual state of health until this PM, when she was noted to be unresponsive. Code blue was subsequently called. At the time of arrival on scene, ACLS protocol was underway.    Technical Description:  - CPR performance duration:  0  minutes  - Was defibrillation or cardioversion used? Yes   - Was external pacer placed? No  - Was patient intubated pre/post CPR? No    Medications Administered: Y = Yes; Blank = No Amiodarone    Atropine    Calcium    Epinephrine   Y  Lidocaine    Magnesium    Norepinephrine    Phenylephrine    Sodium bicarbonate    Vasopressin      Post CPR evaluation:  - Final Status - Was patient successfully resuscitated ? Yes - What is current rhythm? NSR - What is current hemodynamic status? Stable   Miscellaneous Information:  - Labs sent, including: BMET, Trop, EKG  - Primary team notified?  Yes  - Family Notified? No  - Additional notes/ transfer status: Transferred to stepdown   Wenda Low, MD  08/01/2013, 1:11 PM

## 2013-08-01 NOTE — Progress Notes (Addendum)
[facility]                                                                                Patient Demographics  Lori Beard, is a 62 y.o. female, DOB - August 04, 1951, [id number]  Admit date - 08/01/2013   Admitting Physician Eduard Clos, MD  Outpatient Primary MD for the patient is Lupe Carney, MD  LOS - 0   Chief Complaint  Patient presents with  . Headache        Assessment & Plan    She was admitted few hours ago this morning.    1.CAP with SIRs - admitted few hours ago, feels better, continue empiric antibiotics which include IV Rocephin and azithromycin, monitor cultures. Nebulizer treatments oxygen as needed.    2.HTN - on verapamil we'll continue, will write for holding parameters as blood pressure is slightly on the softer side.    3. Anemia of chronic disease. No acute issues, expect some fall in H&H due to dilution from IV fluids, monitor clinically.    4. Mild ARF. Secondary to dehydration caused by Lasix in the setting of community-acquired pneumonia and Sirs, hold diuretics, ACE/ARB, gentle IV fluids monitor renal function.    5. Hypokalemia. Replaced oral and IV. Recheck in the morning.    6. Mild thrombocytopenia. Monitor clinically.    Note there was a question of TTP/HUS on admission, clinically she appears stable, LDH, heptoglobin, peripheral smear ordered, monitor CBC BMP closely.      Addendum -    10.50 am - Patient's troponin which was ordered in the ER has come back slightly elevated, she is chest pain-free, question mild troponin rise due to pneumonia, will place her on aspirin, low-dose beta blocker and statin. Echogram. Cycle troponins and monitor repeat EKG. Cardiology consult called.    1pm - patient had a Run of V tach and passed out, came back with 1 shock, no chest pain, will Max B blocker and stop Calan SR, IV Mag, replace K as above, Echo pending, IV Amio per protocol, transfer to SDU, Cards called  again to see the patient ASAP.        Code Status: full  Family Communication:  None present  Disposition Plan: Home   Procedures     Consults      Medications  Scheduled Meds: . amiodarone  150 mg Intravenous Once  . amitriptyline  75 mg Oral QHS  . aspirin  81 mg Oral Daily  . atorvastatin  10 mg Oral q1800  . [START ON 08/02/2013] azithromycin  500 mg Intravenous Q24H  . [START ON 08/02/2013] cefTRIAXone (ROCEPHIN)  IV  1 g Intravenous Q24H  . enoxaparin (LOVENOX) injection  40 mg Subcutaneous Q24H  . feeding supplement (ENSURE COMPLETE)  237 mL Oral q morning - 10a  . magnesium sulfate 1 - 4 g bolus IVPB  1 g Intravenous Once  . metoprolol tartrate  12.5 mg Oral BID  . pantoprazole  40 mg Oral q morning - 10a  . potassium chloride  10 mEq Intravenous Q1 Hr x 4  . sodium chloride  3 mL Intravenous Q12H  . [START ON 08/02/2013] verapamil  120 mg Oral  Daily   Continuous Infusions: . sodium chloride    . amiodarone (NEXTERONE PREMIX) 360 mg/200 mL dextrose     Followed by  . amiodarone (NEXTERONE PREMIX) 360 mg/200 mL dextrose     PRN Meds:.acetaminophen, acetaminophen, HYDROcodone-acetaminophen, ondansetron (ZOFRAN) IV, ondansetron  DVT Prophylaxis  Lovenox  Lab Results  Component Value Date   PLT 156 08/01/2013    Antibiotics    Anti-infectives   Start     Dose/Rate Route Frequency Ordered Stop   08/02/13 0400  azithromycin (ZITHROMAX) 500 mg in dextrose 5 % 250 mL IVPB     500 mg 250 mL/hr over 60 Minutes Intravenous Every 24 hours 08/01/13 0637     08/02/13 0000  cefTRIAXone (ROCEPHIN) 1 g in dextrose 5 % 50 mL IVPB     1 g 100 mL/hr over 30 Minutes Intravenous Every 24 hours 08/01/13 0637     08/01/13 0330  cefTRIAXone (ROCEPHIN) 1 g in dextrose 5 % 50 mL IVPB     1 g 100 mL/hr over 30 Minutes Intravenous  Once 08/01/13 0328 08/01/13 0523   08/01/13 0315  azithromycin (ZITHROMAX) 500 mg in dextrose 5 % 250 mL IVPB     500 mg 250 mL/hr over 60  Minutes Intravenous  Once 08/01/13 0311 08/01/13 0450          Subjective:   Lori Beard today has, No headache, No chest pain, No abdominal pain - No Nausea, No new weakness tingling or numbness, improved Cough - SOB.  Objective:   Filed Vitals:   08/01/13 0500 08/01/13 0515 08/01/13 0545 08/01/13 1314  BP: 129/79 125/70 104/68 148/92  Pulse: 94 95 98 92  Temp:   100.1 F (37.8 C)   TempSrc:   Oral   Resp:   20 18  Height:   5\' 3"  (1.6 m)   Weight:   98.9 kg (218 lb 0.6 oz)   SpO2: 99% 99% 95% 97%    Wt Readings from Last 3 Encounters:  08/01/13 98.9 kg (218 lb 0.6 oz)  07/30/13 96.616 kg (213 lb)  06/15/13 97.523 kg (215 lb)     Intake/Output Summary (Last 24 hours) at 08/01/13 1317 Last data filed at 08/01/13 0751  Gross per 24 hour  Intake      0 ml  Output    250 ml  Net   -250 ml    Exam Awake Alert, Oriented X 3, No new F.N deficits, Normal affect [place].AT,PERRAL Supple Neck,No JVD, No cervical lymphadenopathy appriciated.  Symmetrical Chest wall movement, Good air movement bilaterally, coarse bilateral breath sounds RRR,No Gallops,Rubs or new Murmurs, No Parasternal Heave +ve B.Sounds, Abd Soft, Non tender, No organomegaly appriciated, No rebound - guarding or rigidity. No Cyanosis, Clubbing or edema, No new Rash or bruise     Data Review   Micro Results No results found for this or any previous visit (from the past 240 hour(s)).  Radiology Reports Ct Angio Chest Pe W/cm &/or Wo Cm  08/01/2013   CLINICAL DATA:  Fever, weakness, and dizziness. Symptoms are worsening today. New left rib pain. No injury. Decreased lung sounds in the lower lungs. Syncope and hypoxia.  EXAM: CT ANGIOGRAPHY CHEST WITH CONTRAST  TECHNIQUE: Multidetector CT imaging of the chest was performed using the standard protocol during bolus administration of intravenous contrast. Multiplanar CT image reconstructions including MIPs were obtained to evaluate the vascular anatomy.   CONTRAST:  80mL OMNIPAQUE IOHEXOL 350 MG/ML SOLN  COMPARISON:  None.  FINDINGS: Technically  adequate study with good opacification of the central and segmental pulmonary arteries. No focal filling defects are demonstrated. No evidence of significant pulmonary embolus. There is consolidation in the left lower lung and left lingula consistent with pneumonia. Followup after resolution of acute process is recommended to exclude an underlying obstructing lesion. Airways appear patent. Mild cardiac enlargement. Normal caliber thoracic aorta. No significant lymphadenopathy in the chest. No pleural effusions. Atelectasis in the right lung base. No pneumothorax.  Incidental images through the upper abdomen demonstrate a low-attenuation lesion in the right lobe of the liver measuring 17 mm diameter. The margins are not distinct and a solid lesion is not excluded. Consider further elective evaluation with ultrasound or MRI. Gastric band in place.  Review of the MIP images confirms the above findings.  IMPRESSION: Consolidation in the left lung consistent with pneumonia. No evidence of significant pulmonary embolus.   Electronically Signed   By: Burman Nieves M.D.   On: 08/01/2013 03:07    CBC  Recent Labs Lab 07/30/13 1631 08/01/13 0149 08/01/13 0830  WBC 12.8* 16.8* 14.2*  HGB 12.0 10.6* 10.4*  HCT 37.3 32.8* 31.0*  PLT 165 146* 156  MCV 84.8 83.9 81.4  MCH 27.3 27.1 27.3  MCHC 32.2 32.3 33.5  RDW 14.6 14.8 14.7  LYMPHSABS  --  2.0 1.6  MONOABS  --  0.7 0.6  EOSABS  --  0.0 0.0  BASOSABS  --  0.0 0.0    Chemistries   Recent Labs Lab 07/30/13 1631 08/01/13 0149 08/01/13 0830  NA 137 134* 136  K 3.6 3.1* 2.9*  CL 101 98 99  CO2 27 26 25   GLUCOSE 123* 121* 112*  BUN 12 21 19   CREATININE 0.84 1.15* 0.98  CALCIUM 9.0 8.7 8.6  AST  --   --  16  ALT  --   --  11  ALKPHOS  --   --  80  BILITOT  --   --  0.9    ------------------------------------------------------------------------------------------------------------------ estimated creatinine clearance is 65.9 ml/min (by C-G formula based on Cr of 0.98). ------------------------------------------------------------------------------------------------------------------ No results found for this basename: HGBA1C,  in the last 72 hours ------------------------------------------------------------------------------------------------------------------ No results found for this basename: CHOL, HDL, LDLCALC, TRIG, CHOLHDL, LDLDIRECT,  in the last 72 hours ------------------------------------------------------------------------------------------------------------------ No results found for this basename: TSH, T4TOTAL, FREET3, T3FREE, THYROIDAB,  in the last 72 hours ------------------------------------------------------------------------------------------------------------------ No results found for this basename: VITAMINB12, FOLATE, FERRITIN, TIBC, IRON, RETICCTPCT,  in the last 72 hours  Coagulation profile No results found for this basename: INR, PROTIME,  in the last 168 hours  No results found for this basename: DDIMER,  in the last 72 hours  Cardiac Enzymes  Recent Labs Lab 08/01/13 0830  TROPONINI 0.84*   ------------------------------------------------------------------------------------------------------------------ No components found with this basename: POCBNP,      Time Spent in minutes  35   SINGH,PRASHANT K M.D on 08/01/2013 at 1:17 PM  Between 7am to 7pm - Pager - [phone]  After 7pm go to [email or web address] - password [email or web address]  And look for the night coverage person covering for me after hours  [facility] Office  [phone]

## 2013-08-01 NOTE — Progress Notes (Signed)
Echo Lab  2D Echocardiogram completed.  Larin Depaoli L Adilson Grafton, RDCS 08/01/2013 3:52 PM

## 2013-08-01 NOTE — Care Management Note (Unsigned)
    Page 1 of 1   08/01/2013     11:21:37 AM   CARE MANAGEMENT NOTE 08/01/2013  Patient:  Lori Beard, Lori Beard   Account Number:  [id number]  Date Initiated:  08/01/2013  Documentation initiated by:  Letha Cape  Subjective/Objective Assessment:   dx pna  admit- lives with spouse.     Action/Plan:   Anticipated DC Date:  08/03/2013   Anticipated DC Plan:  HOME/SELF CARE      DC Planning Services  CM consult      Choice offered to / List presented to:             Status of service:  In process, will continue to follow Medicare Important Message given?   (If response is "NO", the following Medicare IM given date fields will be blank) Date Medicare IM given:   Date Additional Medicare IM given:    Discharge Disposition:    Per UR Regulation:  Reviewed for med. necessity/level of care/duration of stay  If discussed at Long Length of Stay Meetings, dates discussed:    Comments:  08/01/13 11:20 Letha Cape RN, BSN [phone] patient lives with spouse, No needs anticipated.  NCM will continue to follow for dc needs.

## 2013-08-01 NOTE — ED Provider Notes (Signed)
CSN: [id number]     Arrival date & time 07/31/13  2244 History   First MD Initiated Contact with Patient 08/01/13 0112     Chief Complaint  Patient presents with  . Headache   (Consider location/radiation/quality/duration/timing/severity/associated sxs/prior Treatment) HPI 62 year old female presents to emergency room from home after syncopal event.  Patient reports that she has had dizziness over last week.  Dizziness is worse when she lays completely flat or when she stands up quickly.  She started a new unknown blood pressure medicine about 10 days ago, and has noted since that time.  She has had increased symptoms.  Patient was seen in the emergency department yesterday with complaint of headache.  That time.  She was noted to have a fever of 103.  Patient denies any further headache.  She thinks she may have had a fever today, but did not check her temperature.  Patient reports she was walking to the bathroom when she suddenly felt that she was going to pass out.  Patient reports she fell landing on her bottom.  She did not hit her head.  She called to her husband as things got gray and he found her sitting in the bathroom.  Patient complaining of left flank and chest pain, worse when she takes a deep breath that has occurred since her fall.  She did not strike this area.  She did not denies any pain to her bottom.  Evaluation yesterday did not show any signs of meningitis.  It was thought that she had a viral syndrome. Past Medical History  Diagnosis Date  . Hypertension   . Morbid obesity   . Shortness of breath     ONLY WITH EXERTION  . GERD (gastroesophageal reflux disease)   . Arthritis     ALL OVER  . Sleep apnea 02/15/2012    SLEEP STUDY IN EPIC - MILD OSA-CPAP NOT RECOMMENDED-HOME OXYGEN SUGGESTED BECAUSE OF OXYGEN DESATS ON ROOM AIR.   Past Surgical History  Procedure Laterality Date  . Abdominal hysterectomy    . Knee arthroscopy    . Cholecystectomy    . Breath tek h pylori   02/15/2012    Procedure: BREATH TEK H PYLORI;  Surgeon: Valarie Merino, MD;  Location: [facility];  Service: General;  Laterality: N/A;  745  . Joint replacement      BILATERAL TOTAL HIP REPLACEMENTS  . Mesh applied to lap port N/A 11/22/2012    Procedure: MESH APPLIED TO LAP PORT;  Surgeon: Valarie Merino, MD;  Location: [facility];  Service: General;  Laterality: N/A;  . Laparoscopic gastric banding     Family History  Problem Relation Age of Onset  . Kidney disease Mother   . Heart disease Father   . Cancer Father     colon  . Cancer Sister     breast   History  Substance Use Topics  . Smoking status: Never Smoker   . Smokeless tobacco: Never Used  . Alcohol Use: No   OB History   Grav Para Term Preterm Abortions TAB SAB Ect Mult Living                 Review of Systems  Constitutional: Positive for fever and chills.  Respiratory: Positive for shortness of breath.   Cardiovascular: Positive for chest pain.  Neurological: Positive for dizziness, syncope, light-headedness and headaches.  All other systems reviewed and are negative.    Allergies  Ace inhibitors; Iron; and Morphine and  related  Home Medications   Current Outpatient Rx  Name  Route  Sig  Dispense  Refill  . amitriptyline (ELAVIL) 25 MG tablet   Oral   Take 75 mg by mouth at bedtime.         [name] aspirin 325 MG tablet   Oral   Take 325 mg by mouth every morning.          . furosemide (LASIX) 40 MG tablet   Oral   Take 40 mg by mouth every morning.          [name] HYDROcodone-acetaminophen (NORCO/VICODIN) 5-325 MG per tablet   Oral   Take 2 tablets by mouth every 6 (six) hours as needed for pain.   20 tablet   0   . pantoprazole (PROTONIX) 40 MG tablet   Oral   Take 40 mg by mouth every morning.          [name] PRESCRIPTION MEDICATION   Oral   Take 1 tablet by mouth 2 (two) times daily. Pt take a "unknown blood pressure medication" her pharmacy is closed at this time         . verapamil  (CALAN-SR) 240 MG CR tablet   Oral   Take 240 mg by mouth every morning.          BP 110/52  Pulse 83  Temp(Src) 99.4 F (37.4 C) (Oral)  Resp 19  Ht 5\' 3"  (1.6 m)  Wt 215 lb (97.523 kg)  BMI 38.09 kg/m2  SpO2 93% Physical Exam  Nursing note and vitals reviewed. Constitutional: She is oriented to person, place, and time. She appears well-developed and well-nourished. No distress.  HENT:  Head: Normocephalic and atraumatic.  Right Ear: External ear normal.  Left Ear: External ear normal.  Nose: Nose normal.  Mouth/Throat: Oropharynx is clear and moist.  Eyes: Conjunctivae and EOM are normal. Pupils are equal, round, and reactive to light.  Neck: Normal range of motion. Neck supple. No JVD present. No tracheal deviation present. No thyromegaly present.  Cardiovascular: Normal rate, regular rhythm, normal heart sounds and intact distal pulses.  Exam reveals no gallop and no friction rub.   No murmur heard. Pulmonary/Chest: Effort normal and breath sounds normal. No stridor. No respiratory distress. She has no wheezes. She has no rales. She exhibits tenderness (mild chest tenderness to left lateral midaxillary line, no step-off or crepitus).  Abdominal: Soft. Bowel sounds are normal. She exhibits no distension and no mass. There is no tenderness. There is no rebound and no guarding.  Musculoskeletal: Normal range of motion. She exhibits no edema and no tenderness.  Lymphadenopathy:    She has no cervical adenopathy.  Neurological: She is alert and oriented to person, place, and time. She has normal reflexes. No cranial nerve deficit. She exhibits normal muscle tone. Coordination normal.  Skin: Skin is warm and dry. No rash noted. No erythema. No pallor.  Psychiatric: She has a normal mood and affect. Her behavior is normal. Judgment and thought content normal.    ED Course  Procedures (including critical care time) Labs Review Labs Reviewed  BASIC METABOLIC PANEL - Abnormal;  Notable for the following:    Sodium 134 (*)    Potassium 3.1 (*)    Glucose, Bld 121 (*)    Creatinine, Ser 1.15 (*)    GFR calc non Af Amer 50 (*)    GFR calc Af Amer 57 (*)    All other components within normal limits  URINALYSIS,  ROUTINE W REFLEX MICROSCOPIC - Abnormal; Notable for the following:    APPearance CLOUDY (*)    Bilirubin Urine MODERATE (*)    Ketones, ur 15 (*)    Protein, ur 100 (*)    Urobilinogen, UA 2.0 (*)    Leukocytes, UA MODERATE (*)    All other components within normal limits  CBC WITH DIFFERENTIAL - Abnormal; Notable for the following:    WBC 16.8 (*)    Hemoglobin 10.6 (*)    HCT 32.8 (*)    Platelets 146 (*)    Neutrophils Relative % 84 (*)    Neutro Abs 14.1 (*)    All other components within normal limits  URINE MICROSCOPIC-ADD ON - Abnormal; Notable for the following:    Squamous Epithelial / LPF MANY (*)    Bacteria, UA MANY (*)    All other components within normal limits  URINE CULTURE   Imaging Review Ct Angio Chest Pe W/cm &/or Wo Cm  08/01/2013   CLINICAL DATA:  Fever, weakness, and dizziness. Symptoms are worsening today. New left rib pain. No injury. Decreased lung sounds in the lower lungs. Syncope and hypoxia.  EXAM: CT ANGIOGRAPHY CHEST WITH CONTRAST  TECHNIQUE: Multidetector CT imaging of the chest was performed using the standard protocol during bolus administration of intravenous contrast. Multiplanar CT image reconstructions including MIPs were obtained to evaluate the vascular anatomy.  CONTRAST:  80mL OMNIPAQUE IOHEXOL 350 MG/ML SOLN  COMPARISON:  None.  FINDINGS: Technically adequate study with good opacification of the central and segmental pulmonary arteries. No focal filling defects are demonstrated. No evidence of significant pulmonary embolus. There is consolidation in the left lower lung and left lingula consistent with pneumonia. Followup after resolution of acute process is recommended to exclude an underlying obstructing  lesion. Airways appear patent. Mild cardiac enlargement. Normal caliber thoracic aorta. No significant lymphadenopathy in the chest. No pleural effusions. Atelectasis in the right lung base. No pneumothorax.  Incidental images through the upper abdomen demonstrate a low-attenuation lesion in the right lobe of the liver measuring 17 mm diameter. The margins are not distinct and a solid lesion is not excluded. Consider further elective evaluation with ultrasound or MRI. Gastric band in place.  Review of the MIP images confirms the above findings.  IMPRESSION: Consolidation in the left lung consistent with pneumonia. No evidence of significant pulmonary embolus.   Electronically Signed   By: Burman Nieves M.D.   On: 08/01/2013 03:07    EKG Interpretation    Date/Time:  Tuesday August 01 2013 02:18:51 EST Ventricular Rate:  87 PR Interval:  150 QRS Duration: 109 QT Interval:  349 QTC Calculation: 420 R Axis:   28 Text Interpretation:  Sinus rhythm Borderline repolarization abnormality t wave in versions have resolved in lead 2 from prior Confirmed by Tyquan Carmickle  MD, Adra Shepler ([id number]) on 08/01/2013 2:35:03 AM            MDM   1. CAP (community acquired pneumonia)   2. Hypoxia    62 year old female with syncopal event.  Tonight, found to be hypoxic on room air.  She is also complaining of left chest, flank pain, pleuritic in nature.  Syncope, may be orthostatic in nature.  Given recent blood pressure medication change, however, I am concerned about PE given her symptoms.  We'll check baseline labs, plan for CT angio of chest  chest CT scan without PE, but does show a left sided pneumonia.  Patient with significant drop in her oxygen  saturations when she ambulates without oxygen.  Discuss with hospitalist for admission for community acquired pneumonia, and hypoxia.  Patient has been updated on findings and plan.   Olivia Mackie, MD 08/01/13 [id number]

## 2013-08-01 NOTE — ED Notes (Signed)
Report has been given to the floor.  Will send patient once antibiotics are finished.

## 2013-08-01 NOTE — H&P (Signed)
[facility] History and Physical  Lori Beard UJW:[id number] DOB: 1951/02/26 DOA: 08/01/2013  Referring physician: ER physician. PCP: Lupe Carney, MD   Chief Complaint: Fall.  HPI: Lori Beard is a 62 y.o. female with history of hypertension has been experiencing productive cough with headache and dizziness for last 2 days. Patient did come 2 days ago to the ER and was discharged home. Last night when patient was trying to walk she felt dizzy and fell but did not lose consciousness and was brought to the ER. Patient was complaining of mild shortness of breath with mild pleuritic type of chest pain on the left side. CT angiogram of the chest was done to rule out PE which was negative but did show pneumonic process. Patient also has been having fever with leukocytosis. Patient has been admitted for IV antibiotics for pneumonia. Patient states that she still has mild headache. Presently chest pain-free and not in acute distress. Labs also show anemia with thrombocytopenia with mild renal dysfunction. Patient denies having had any black stools.   Review of Systems: As presented in the history of presenting illness, rest negative.  Past Medical History  Diagnosis Date  . Hypertension   . Morbid obesity   . Shortness of breath     ONLY WITH EXERTION  . GERD (gastroesophageal reflux disease)   . Arthritis     ALL OVER  . Sleep apnea 02/15/2012    SLEEP STUDY IN EPIC - MILD OSA-CPAP NOT RECOMMENDED-HOME OXYGEN SUGGESTED BECAUSE OF OXYGEN DESATS ON ROOM AIR.   Past Surgical History  Procedure Laterality Date  . Abdominal hysterectomy    . Knee arthroscopy    . Cholecystectomy    . Breath tek h pylori  02/15/2012    Procedure: BREATH TEK H PYLORI;  Surgeon: Valarie Merino, MD;  Location: [facility];  Service: General;  Laterality: N/A;  745  . Joint replacement      BILATERAL TOTAL HIP REPLACEMENTS  . Mesh applied to lap port N/A 11/22/2012    Procedure: MESH APPLIED TO  LAP PORT;  Surgeon: Valarie Merino, MD;  Location: [facility];  Service: General;  Laterality: N/A;  . Laparoscopic gastric banding     Social History:  reports that she has never smoked. She has never used smokeless tobacco. She reports that she does not drink alcohol or use illicit drugs. Where does patient live home. Can patient participate in ADLs? Yes.  Allergies  Allergen Reactions  . Ace Inhibitors Swelling  . Iron Other (See Comments)    Gives patient migraine headaches when supplemented  . Morphine And Related Itching and Rash    Family History:  Family History  Problem Relation Age of Onset  . Kidney disease Mother   . Heart disease Father   . Cancer Father     colon  . Cancer Sister     breast      Prior to Admission medications   Medication Sig Start Date End Date Taking? Authorizing Provider  amitriptyline (ELAVIL) 25 MG tablet Take 75 mg by mouth at bedtime. 04/12/12  Yes Ryan M Dunn, PA-C  aspirin 325 MG tablet Take 325 mg by mouth every morning.    Yes Historical Provider, MD  furosemide (LASIX) 40 MG tablet Take 40 mg by mouth every morning.    Yes Historical Provider, MD  HYDROcodone-acetaminophen (NORCO/VICODIN) 5-325 MG per tablet Take 2 tablets by mouth every 6 (six) hours as needed for pain. 01/08/13  Yes Rhona Leavens  Norlene Campbell, MD  pantoprazole (PROTONIX) 40 MG tablet Take 40 mg by mouth every morning.    Yes Historical Provider, MD  PRESCRIPTION MEDICATION Take 1 tablet by mouth 2 (two) times daily. Pt take a "unknown blood pressure medication" her pharmacy is closed at this time   Yes Historical Provider, MD  verapamil (CALAN-SR) 240 MG CR tablet Take 240 mg by mouth every morning.   Yes Historical Provider, MD    Physical Exam: Filed Vitals:   08/01/13 0445 08/01/13 0500 08/01/13 0515 08/01/13 0545  BP: 124/68 129/79 125/70 104/68  Pulse: 93 94 95 98  Temp:    100.1 F (37.8 C)  TempSrc:    Oral  Resp: 22   20  Height:    5\' 3"  (1.6 m)  Weight:    98.9 kg (218  lb 0.6 oz)  SpO2: 97% 99% 99% 95%     General:  Well-developed well-nourished.  Eyes: Anicteric no pallor.  ENT: No discharge from ears eyes nose mouth.  Neck: No mass felt.  Cardiovascular: S1-S2 heard.  Respiratory: No rhonchi or crepitations.  Abdomen: Soft nontender bowel sounds present.  Skin: No rash.  Musculoskeletal: No edema.  Psychiatric: Appears normal.  Neurologic: Alert awake oriented to time place and person. Moves all extremities.  Labs on Admission:  Basic Metabolic Panel:  Recent Labs Lab 07/30/13 1631 08/01/13 0149  NA 137 134*  K 3.6 3.1*  CL 101 98  CO2 27 26  GLUCOSE 123* 121*  BUN 12 21  CREATININE 0.84 1.15*  CALCIUM 9.0 8.7   Liver Function Tests: No results found for this basename: AST, ALT, ALKPHOS, BILITOT, PROT, ALBUMIN,  in the last 168 hours No results found for this basename: LIPASE, AMYLASE,  in the last 168 hours No results found for this basename: AMMONIA,  in the last 168 hours CBC:  Recent Labs Lab 07/30/13 1631 08/01/13 0149  WBC 12.8* 16.8*  NEUTROABS  --  14.1*  HGB 12.0 10.6*  HCT 37.3 32.8*  MCV 84.8 83.9  PLT 165 146*   Cardiac Enzymes: No results found for this basename: CKTOTAL, CKMB, CKMBINDEX, TROPONINI,  in the last 168 hours  BNP (last 3 results) No results found for this basename: PROBNP,  in the last 8760 hours CBG: No results found for this basename: GLUCAP,  in the last 168 hours  Radiological Exams on Admission: Ct Angio Chest Pe W/cm &/or Wo Cm  08/01/2013   CLINICAL DATA:  Fever, weakness, and dizziness. Symptoms are worsening today. New left rib pain. No injury. Decreased lung sounds in the lower lungs. Syncope and hypoxia.  EXAM: CT ANGIOGRAPHY CHEST WITH CONTRAST  TECHNIQUE: Multidetector CT imaging of the chest was performed using the standard protocol during bolus administration of intravenous contrast. Multiplanar CT image reconstructions including MIPs were obtained to evaluate the  vascular anatomy.  CONTRAST:  80mL OMNIPAQUE IOHEXOL 350 MG/ML SOLN  COMPARISON:  None.  FINDINGS: Technically adequate study with good opacification of the central and segmental pulmonary arteries. No focal filling defects are demonstrated. No evidence of significant pulmonary embolus. There is consolidation in the left lower lung and left lingula consistent with pneumonia. Followup after resolution of acute process is recommended to exclude an underlying obstructing lesion. Airways appear patent. Mild cardiac enlargement. Normal caliber thoracic aorta. No significant lymphadenopathy in the chest. No pleural effusions. Atelectasis in the right lung base. No pneumothorax.  Incidental images through the upper abdomen demonstrate a low-attenuation lesion in the right lobe  of the liver measuring 17 mm diameter. The margins are not distinct and a solid lesion is not excluded. Consider further elective evaluation with ultrasound or MRI. Gastric band in place.  Review of the MIP images confirms the above findings.  IMPRESSION: Consolidation in the left lung consistent with pneumonia. No evidence of significant pulmonary embolus.   Electronically Signed   By: Burman Nieves M.D.   On: 08/01/2013 03:07    EKG: Independently reviewed. Normal sinus rhythm with nonspecific T-wave changes.  Assessment/Plan Principal Problem:   Pneumonia Active Problems:   HTN (hypertension)   Anemia   Thrombocytopenia   Renal failure, acute   1. Pneumonia - continue ceftriaxone and Zithromax. Check urine Legionella and strep antigen. Check influenza PCR. 2. Renal failure - probably secondary to dehydration. Continue hydration hold Lasix. 3. Hypertension - hold Lasix for now. Continue verapamil. 4. Anemia and thrombocytopenia with renal failure - patient's labs done 2 days ago did not show any anemia or thrombocytopenia.        At this time I am ordering LDH levels if elevated may need to rule out TTP or hemolytic process.  Closely follow CBC. 5.  Possible UTI - check urine cultures. Patient's on ceftriaxone.    Code Status: Full code.  Family Communication: None.  Disposition Plan: Admit to inpatient.    Syriah Delisi N. [facility] Pager [phone].  If 7PM-7AM, please contact night-coverage [email or web address] Password [facility] 08/01/2013, 6:38 AM

## 2013-08-01 NOTE — Consult Note (Addendum)
 Cardiology Consultation Note  Patient ID: Lori Beard, MRN: [id number], DOB/AGE: 11/28/1950 62 y.o. Admit date: 08/01/2013   Date of Consult: 08/01/2013 Primary Physician: MITCHELL,DEAN, MD Primary Cardiologist: New  Chief Complaint: SOB, cough Reason for Consult: elevated troponin, VT  HPI: Lori Beard is a 62 y/o F with history of HTN, morbid obesity s/p lapband surgery, OSA who presented to [facility] with productive cough, headache, and malaise for about a week. She was at church on Sunday 07/30/13 and just didn't feel right with headache/fatigue so came to the ED. TMax 103 at that time but no localizing signs, thus supportive care was recommended and she was discharged. She continued to feel poorly thus returned to the ER earlier this morning with continued cough, dizziness, and some SOB. She also reports a L sided back/chest/flank pain that has been constant for a week, possibly exacerbated by coughing. CTA ruled out PE but showed LLL consolidation c/w PNA thus has been started on antibiotics. (Also showed mild cardiomegaly.) She was felt to have SIRS criteria with WBC 16k. She does not exercise but otherwise denies any recent exertional sx. Troponin was ordered this morning as part of workup - 0.84. pBNP 2445. At the time that these labs returned, she was not having any further CP or SOB. This afternoon she closed her eyes to go to sleep and apparently went into sustained monomorphic VT. This deteriorated into VF. It appears her arrhythmia was precipitated by an R-on-T PVC. She was unresponsive with eyes rolled back in her head. Per report she received about 10 seconds of CPR then 1 shock restored ROSC & consciousness. The whole arrhythmia lasted less than 4 minutes. She denies any acute preceding CP or SOB. She denies previous history of palpitations, near syncope or syncope. She currently is without acute complaints, just "kind of tired." ECG nonspecific changes but nonacute.  Potassium was 2.9 this AM - Dr. Singh reported to me that he has been actively replacing her potassium and also ordered magnesium sulfate. He has also ordered amiodarone.    Past Medical History  Diagnosis Date  . Hypertension   . Morbid obesity     a. s/p lapband surgery 11/2012.  . GERD (gastroesophageal reflux disease)   . Arthritis     ALL OVER  . Sleep apnea 02/15/2012    SLEEP STUDY IN EPIC - MILD OSA-CPAP NOT RECOMMENDED-HOME OXYGEN SUGGESTED BECAUSE OF OXYGEN DESATS ON ROOM AIR.      Most Recent Cardiac Studies: None   Surgical History:  Past Surgical History  Procedure Laterality Date  . Abdominal hysterectomy    . Knee arthroscopy    . Cholecystectomy    . Breath tek h pylori  02/15/2012    Procedure: BREATH TEK H PYLORI;  Surgeon: Matthew B Martin, MD;  Location: [facility];  Service: General;  Laterality: N/A;  745  . Joint replacement      BILATERAL TOTAL HIP REPLACEMENTS  . Mesh applied to lap port N/A 11/22/2012    Procedure: MESH APPLIED TO LAP PORT;  Surgeon: Matthew B Martin, MD;  Location: [facility];  Service: General;  Laterality: N/A;  . Laparoscopic gastric banding       Home Meds: Prior to Admission medications   Medication Sig Start Date End Date Taking? Authorizing Provider  amitriptyline (ELAVIL) 25 MG tablet Take 75 mg by mouth at bedtime. 04/12/12  Yes Ryan M Dunn, PA-C  aspirin 325 MG tablet Take 325 mg by mouth every morning.      Yes Historical Provider, MD  furosemide (LASIX) 40 MG tablet Take 40 mg by mouth every morning.    Yes Historical Provider, MD  HYDROcodone-acetaminophen (NORCO/VICODIN) 5-325 MG per tablet Take 2 tablets by mouth every 6 (six) hours as needed for pain. 01/08/13  Yes Olga M Otter, MD  metoprolol tartrate (LOPRESSOR) 25 MG tablet Take 25 mg by mouth 2 (two) times daily.   Yes Historical Provider, MD  Multiple Vitamin (MULTIVITAMIN) tablet Take 1 tablet by mouth daily.   Yes Historical Provider, MD  pantoprazole (PROTONIX) 40 MG  tablet Take 40 mg by mouth every morning.    Yes Historical Provider, MD  verapamil (CALAN-SR) 240 MG CR tablet Take 240 mg by mouth every morning.   Yes Historical Provider, MD    Inpatient Medications:  . amitriptyline  75 mg Oral QHS  . aspirin  81 mg Oral Daily  . atorvastatin  10 mg Oral q1800  . [START ON 08/02/2013] azithromycin  500 mg Intravenous Q24H  . [START ON 08/02/2013] cefTRIAXone (ROCEPHIN)  IV  1 g Intravenous Q24H  . enoxaparin (LOVENOX) injection  40 mg Subcutaneous Q24H  . feeding supplement (ENSURE COMPLETE)  237 mL Oral q morning - 10a  . metoprolol tartrate  12.5 mg Oral BID  . pantoprazole  40 mg Oral q morning - 10a  . potassium chloride  10 mEq Intravenous Q1 Hr x 4  . sodium chloride  3 mL Intravenous Q12H  . [START ON 08/02/2013] verapamil  120 mg Oral Daily   . 0.9 % NaCl with KCl 40 mEq / L 75 mL/hr at 08/01/13 1045    Allergies:  Allergies  Allergen Reactions  . Ace Inhibitors Swelling  . Iron Other (See Comments)    Gives patient migraine headaches when supplemented  . Morphine And Related Itching and Rash    History   Social History  . Marital Status: Married    Spouse Name: N/A    Number of Children: N/A  . Years of Education: N/A   Occupational History  . Not on file.   Social History Main Topics  . Smoking status: Never Smoker   . Smokeless tobacco: Never Used  . Alcohol Use: No  . Drug Use: No  . Sexual Activity: Not Currently   Other Topics Concern  . Not on file   Social History Narrative  . No narrative on file     Family History  Problem Relation Age of Onset  . Kidney disease Mother   . Heart disease Father   . Cancer Father     colon  . Cancer Sister     breast     Review of Systems: General: see above Cardiovascular: negative for edema, orthopnea Dermatological: negative for rash Respiratory: see above. Urologic: negative for hematuria Abdominal: negative for nausea, vomiting, diarrhea, bright red  blood per rectum, melena, or hematemesis Neurologic: see above. No hx TIA/stroke. All other systems reviewed and are otherwise negative except as noted above.  Labs:  Recent Labs  08/01/13 0830  TROPONINI 0.84*   Lab Results  Component Value Date   WBC 14.2* 08/01/2013   HGB 10.4* 08/01/2013   HCT 31.0* 08/01/2013   MCV 81.4 08/01/2013   PLT 156 08/01/2013    Recent Labs Lab 08/01/13 0830  NA 136  K 2.9*  CL 99  CO2 25  BUN 19  CREATININE 0.98  CALCIUM 8.6  PROT 7.0  BILITOT 0.9  ALKPHOS 80  ALT 11  AST   16  GLUCOSE 112*    Radiology/Studies:  Ct Angio Chest Pe W/cm &/or Wo Cm  08/01/2013   CLINICAL DATA:  Fever, weakness, and dizziness. Symptoms are worsening today. New left rib pain. No injury. Decreased lung sounds in the lower lungs. Syncope and hypoxia.  EXAM: CT ANGIOGRAPHY CHEST WITH CONTRAST  TECHNIQUE: Multidetector CT imaging of the chest was performed using the standard protocol during bolus administration of intravenous contrast. Multiplanar CT image reconstructions including MIPs were obtained to evaluate the vascular anatomy.  CONTRAST:  80mL OMNIPAQUE IOHEXOL 350 MG/ML SOLN  COMPARISON:  None.  FINDINGS: Technically adequate study with good opacification of the central and segmental pulmonary arteries. No focal filling defects are demonstrated. No evidence of significant pulmonary embolus. There is consolidation in the left lower lung and left lingula consistent with pneumonia. Followup after resolution of acute process is recommended to exclude an underlying obstructing lesion. Airways appear patent. Mild cardiac enlargement. Normal caliber thoracic aorta. No significant lymphadenopathy in the chest. No pleural effusions. Atelectasis in the right lung base. No pneumothorax.  Incidental images through the upper abdomen demonstrate a low-attenuation lesion in the right lobe of the liver measuring 17 mm diameter. The margins are not distinct and a solid lesion is  not excluded. Consider further elective evaluation with ultrasound or MRI. Gastric band in place.  Review of the MIP images confirms the above findings.  IMPRESSION: Consolidation in the left lung consistent with pneumonia. No evidence of significant pulmonary embolus.   Electronically Signed   By: William  Stevens M.D.   On: 08/01/2013 03:07    EKG: this AM: EKG NSR 87bpm TWI III, avF, V3-V6 - QTC 420 Post arrest: NSR 92bpm TWI III, avF but otherwise no acute ST-T changes - QTC 467  [2013 EKG did have TWI but located in III, avF, V2-V3]  Strips appear to have an R-on-T PVC then monomorphic VT then VF  Physical Exam: Blood pressure 104/68, pulse 98, temperature 100.1 F (37.8 C), temperature source Oral, resp. rate 20, height 5' 3" (1.6 m), weight 218 lb 0.6 oz (98.9 kg), SpO2 95.00%. General: Well developed, well nourished AAF in no acute distress. Head: Normocephalic, atraumatic, sclera non-icteric, no xanthomas, nares are without discharge.  Neck: Negative for carotid bruits. JVD not elevated. Lungs: Coarse bilaterally BS to auscultation without wheezes, rales, or rhonchi. Breathing is unlabored. Heart: RRR with S1 S2. No murmurs, rubs, or gallops appreciated. Abdomen: Soft, non-tender, non-distended with normoactive bowel sounds. No hepatomegaly. No rebound/guarding. No obvious abdominal masses. Msk:  Strength and tone appear normal for age. Extremities: No clubbing or cyanosis. No edema.  Distal pedal pulses are 2+ and equal bilaterally. Neuro: Alert and oriented X 3. No facial asymmetry. No focal deficit. Moves all extremities spontaneously. Psych:  Responds to questions appropriately with a normal affect.   Assessment and Plan:   1. Community acquired pneumonia with SIRS 2. NSTEMI 3. VT/VF arrest 4. Hypokalemia 5. HTN, controlled 6. Abnormal liver lesion on CT, f/u as per IM 7. Obesity s/p lap band surgery - BMI 38.6   Agree with the excellent care plan already implemented by  Dr. Singh who is repleting potassium/magnesium, implementing short-term amiodarone, and titrating beta blocker. We suspect ventricular arrest was related to her profound hypokalemia. Mg level pending. Repeat BMET at 6pm. At this time we feel clinical picture is atypical for ACS and that NSTEMI may be demand ischemia in the setting of PNA. However, she will need cath to exclude CAD -   timing to be determined based on clinical course of PNA. EP will round on the patient in AM.  Continue to cycle troponins but per discussion with Dr. Teigen Bellin, hold off heparin for now in absence of acute EKG changes and CP. Will rx additional aspirin to total 324mg today then continue 81mg daily. Continue statin as ordered and check lipid panel. Will follow for 2D echo results.  Signed, Dayna Dunn PA-C 08/01/2013, 1:13 PM   Attending Note:   The patient was seen and examined.  Agree with assessment and plan as noted above.  Changes made to the above note as needed.  Lori Beard presents with pneumonia.  She had a VT arrest today ( NSR with R on T which lead to very fast monomorphic VT which later degraded to coarse VF .  She was successfully defibrilated.  . She is awake and alert afterward.  No angina.  I suspect this was a primary arrhythmic event - no real evidence for ischemia.   I think it we should probably do a cardiac cath prior to DC  - .  No indication for heparin for this time.  We will see if she is ready for cath in the am  Will put her on for late morning or afternoon and then decide on rounds.    Nikita Humble J. Jahlil Ziller, Jr., MD, FACC 08/01/2013, 3:39 PM   

## 2013-08-02 ENCOUNTER — Encounter (HOSPITAL_COMMUNITY)
Admission: EM | Disposition: A | Discharge status: Home / Self Care | Payer: Self-pay | Source: Home / Self Care | Attending: Internal Medicine

## 2013-08-02 DIAGNOSIS — I4901 Ventricular fibrillation: Secondary | ICD-10-CM

## 2013-08-02 DIAGNOSIS — R7989 Other specified abnormal findings of blood chemistry: Secondary | ICD-10-CM

## 2013-08-02 DIAGNOSIS — E876 Hypokalemia: Secondary | ICD-10-CM

## 2013-08-02 DIAGNOSIS — I472 Ventricular tachycardia: Secondary | ICD-10-CM | POA: Diagnosis not present

## 2013-08-02 DIAGNOSIS — I469 Cardiac arrest, cause unspecified: Secondary | ICD-10-CM

## 2013-08-02 HISTORY — PX: CARDIAC CATHETERIZATION: SHX172

## 2013-08-02 HISTORY — PX: LEFT HEART CATHETERIZATION WITH CORONARY ANGIOGRAM: SHX5451

## 2013-08-02 LAB — CBC
HCT: 29.8 % — ABNORMAL LOW (ref 36.0–46.0)
Hemoglobin: 9.3 g/dL — ABNORMAL LOW (ref 12.0–15.0)
MCH: 26.6 pg (ref 26.0–34.0)
MCHC: 31.2 g/dL (ref 30.0–36.0)
RDW: 15.3 % (ref 11.5–15.5)

## 2013-08-02 LAB — URINE CULTURE

## 2013-08-02 LAB — PROTIME-INR
INR: 1.26 (ref 0.00–1.49)
Prothrombin Time: 15.5 seconds — ABNORMAL HIGH (ref 11.6–15.2)

## 2013-08-02 LAB — BASIC METABOLIC PANEL
BUN: 20 mg/dL (ref 6–23)
Calcium: 8.6 mg/dL (ref 8.4–10.5)
Chloride: 102 mEq/L (ref 96–112)
Creatinine, Ser: 0.97 mg/dL (ref 0.50–1.10)
GFR calc Af Amer: 71 mL/min — ABNORMAL LOW (ref >90)

## 2013-08-02 LAB — PATHOLOGIST SMEAR REVIEW

## 2013-08-02 LAB — LIPID PANEL
LDL Cholesterol: 73 mg/dL (ref 0–99)
Triglycerides: 122 mg/dL (ref <150)

## 2013-08-02 LAB — MRSA PCR SCREENING: MRSA by PCR: NEGATIVE

## 2013-08-02 LAB — LEGIONELLA ANTIGEN, URINE

## 2013-08-02 SURGERY — LEFT HEART CATHETERIZATION WITH CORONARY ANGIOGRAM
Anesthesia: LOCAL

## 2013-08-02 MED ORDER — HEPARIN SODIUM (PORCINE) 1000 UNIT/ML IJ SOLN
INTRAMUSCULAR | Status: AC
  Filled 2013-08-02: qty 1

## 2013-08-02 MED ORDER — CARVEDILOL 3.125 MG PO TABS: 3.1250 mg | ORAL_TABLET | Freq: Two times a day (BID) | ORAL | Status: DC

## 2013-08-02 MED ORDER — LIDOCAINE HCL (PF) 1 % IJ SOLN
INTRAMUSCULAR | Status: AC
  Filled 2013-08-02: qty 30

## 2013-08-02 MED ORDER — SODIUM CHLORIDE 0.9 % IV SOLN: INTRAVENOUS | Status: AC

## 2013-08-02 MED ORDER — FENTANYL CITRATE 0.05 MG/ML IJ SOLN
INTRAMUSCULAR | Status: AC
  Filled 2013-08-02: qty 2

## 2013-08-02 MED ORDER — HEPARIN (PORCINE) IN NACL 2-0.9 UNIT/ML-% IJ SOLN
INTRAMUSCULAR | Status: AC
  Filled 2013-08-02: qty 1000

## 2013-08-02 MED ORDER — NITROGLYCERIN 0.2 MG/ML ON CALL CATH LAB
INTRAVENOUS | Status: AC
  Filled 2013-08-02: qty 1

## 2013-08-02 MED ORDER — VERAPAMIL HCL 2.5 MG/ML IV SOLN
INTRAVENOUS | Status: AC
  Filled 2013-08-02: qty 2

## 2013-08-02 MED ORDER — MIDAZOLAM HCL 2 MG/2ML IJ SOLN
INTRAMUSCULAR | Status: AC
  Filled 2013-08-02: qty 2

## 2013-08-02 MED ORDER — CARVEDILOL 12.5 MG PO TABS
12.5000 mg | ORAL_TABLET | Freq: Two times a day (BID) | ORAL | Status: DC
  Administered 2013-08-02 – 2013-08-05 (×6): 12.5 mg via ORAL
  Filled 2013-08-02 (×8): qty 1

## 2013-08-02 MED ORDER — ISOSORB DINITRATE-HYDRALAZINE 20-37.5 MG PO TABS
1.0000 | ORAL_TABLET | Freq: Two times a day (BID) | ORAL | Status: DC
  Administered 2013-08-02 – 2013-08-05 (×6): 1 via ORAL
  Filled 2013-08-02 (×7): qty 1

## 2013-08-02 MED FILL — Medication: Qty: 1 | Status: AC

## 2013-08-02 NOTE — Progress Notes (Signed)
Met with Lori Beard to explain Link to [employer] program for [employer] employees/dependents with [employer]. She states she is interested in following up with Link to [facility] Coordinator for HTN. She is awaiting cardiac cath currently. Consents were signed and contact number confirmed. She will receive a post hospital discharge call and will be called to have appointment scheduled for Link to Wellness program. Made inpatient RNCM aware. Left packet at bedside with patient.  Raiford Noble, MSN-Ed, RN,BSN- [facility] [phone]

## 2013-08-02 NOTE — H&P (View-Only) (Signed)
Cardiology Consultation Note  Patient ID: Lori Beard, MRN: [id number], DOB/AGE: 03-08-1951 62 y.o. Admit date: 08/01/2013   Date of Consult: 08/01/2013 Primary Physician: Lupe Carney, MD Primary Cardiologist: New  Chief Complaint: SOB, cough Reason for Consult: elevated troponin, VT  HPI: Lori Beard is a 62 y/o F with history of HTN, morbid obesity s/p lapband surgery, OSA who presented to [facility] with productive cough, headache, and malaise for about a week. She was at church on Sunday 07/30/13 and just didn't feel right with headache/fatigue so came to the ED. TMax 103 at that time but no localizing signs, thus supportive care was recommended and she was discharged. She continued to feel poorly thus returned to the ER earlier this morning with continued cough, dizziness, and some SOB. She also reports a L sided back/chest/flank pain that has been constant for a week, possibly exacerbated by coughing. CTA ruled out PE but showed LLL consolidation c/w PNA thus has been started on antibiotics. (Also showed mild cardiomegaly.) She was felt to have SIRS criteria with WBC 16k. She does not exercise but otherwise denies any recent exertional sx. Troponin was ordered this morning as part of workup - 0.84. pBNP 2445. At the time that these labs returned, she was not having any further CP or SOB. This afternoon she closed her eyes to go to sleep and apparently went into sustained monomorphic VT. This deteriorated into VF. It appears her arrhythmia was precipitated by an R-on-T PVC. She was unresponsive with eyes rolled back in her head. Per report she received about 10 seconds of CPR then 1 shock restored ROSC & consciousness. The whole arrhythmia lasted less than 4 minutes. She denies any acute preceding CP or SOB. She denies previous history of palpitations, near syncope or syncope. She currently is without acute complaints, just "kind of tired." ECG nonspecific changes but nonacute.  Potassium was 2.9 this AM - Dr. Thedore Mins reported to me that he has been actively replacing her potassium and also ordered magnesium sulfate. He has also ordered amiodarone.    Past Medical History  Diagnosis Date  . Hypertension   . Morbid obesity     a. s/p lapband surgery 11/2012.  [name] GERD (gastroesophageal reflux disease)   . Arthritis     ALL OVER  . Sleep apnea 02/15/2012    SLEEP STUDY IN EPIC - MILD OSA-CPAP NOT RECOMMENDED-HOME OXYGEN SUGGESTED BECAUSE OF OXYGEN DESATS ON ROOM AIR.      Most Recent Cardiac Studies: None   Surgical History:  Past Surgical History  Procedure Laterality Date  . Abdominal hysterectomy    . Knee arthroscopy    . Cholecystectomy    . Breath tek h pylori  02/15/2012    Procedure: BREATH TEK H PYLORI;  Surgeon: Valarie Merino, MD;  Location: [facility];  Service: General;  Laterality: N/A;  745  . Joint replacement      BILATERAL TOTAL HIP REPLACEMENTS  . Mesh applied to lap port N/A 11/22/2012    Procedure: MESH APPLIED TO LAP PORT;  Surgeon: Valarie Merino, MD;  Location: [facility];  Service: General;  Laterality: N/A;  . Laparoscopic gastric banding       Home Meds: Prior to Admission medications   Medication Sig Start Date End Date Taking? Authorizing Provider  amitriptyline (ELAVIL) 25 MG tablet Take 75 mg by mouth at bedtime. 04/12/12  Yes Ryan M Dunn, PA-C  aspirin 325 MG tablet Take 325 mg by mouth every morning.  Yes Historical Provider, MD  furosemide (LASIX) 40 MG tablet Take 40 mg by mouth every morning.    Yes Historical Provider, MD  HYDROcodone-acetaminophen (NORCO/VICODIN) 5-325 MG per tablet Take 2 tablets by mouth every 6 (six) hours as needed for pain. 01/08/13  Yes Olivia Mackie, MD  metoprolol tartrate (LOPRESSOR) 25 MG tablet Take 25 mg by mouth 2 (two) times daily.   Yes Historical Provider, MD  Multiple Vitamin (MULTIVITAMIN) tablet Take 1 tablet by mouth daily.   Yes Historical Provider, MD  pantoprazole (PROTONIX) 40 MG  tablet Take 40 mg by mouth every morning.    Yes Historical Provider, MD  verapamil (CALAN-SR) 240 MG CR tablet Take 240 mg by mouth every morning.   Yes Historical Provider, MD    Inpatient Medications:  . amitriptyline  75 mg Oral QHS  . aspirin  81 mg Oral Daily  . atorvastatin  10 mg Oral q1800  . [START ON 08/02/2013] azithromycin  500 mg Intravenous Q24H  . [START ON 08/02/2013] cefTRIAXone (ROCEPHIN)  IV  1 g Intravenous Q24H  . enoxaparin (LOVENOX) injection  40 mg Subcutaneous Q24H  . feeding supplement (ENSURE COMPLETE)  237 mL Oral q morning - 10a  . metoprolol tartrate  12.5 mg Oral BID  . pantoprazole  40 mg Oral q morning - 10a  . potassium chloride  10 mEq Intravenous Q1 Hr x 4  . sodium chloride  3 mL Intravenous Q12H  . [START ON 08/02/2013] verapamil  120 mg Oral Daily   . 0.9 % NaCl with KCl 40 mEq / L 75 mL/hr at 08/01/13 1045    Allergies:  Allergies  Allergen Reactions  . Ace Inhibitors Swelling  . Iron Other (See Comments)    Gives patient migraine headaches when supplemented  . Morphine And Related Itching and Rash    History   Social History  . Marital Status: Married    Spouse Name: N/A    Number of Children: N/A  . Years of Education: N/A   Occupational History  . Not on file.   Social History Main Topics  . Smoking status: Never Smoker   . Smokeless tobacco: Never Used  . Alcohol Use: No  . Drug Use: No  . Sexual Activity: Not Currently   Other Topics Concern  . Not on file   Social History Narrative  . No narrative on file     Family History  Problem Relation Age of Onset  . Kidney disease Mother   . Heart disease Father   . Cancer Father     colon  . Cancer Sister     breast     Review of Systems: General: see above Cardiovascular: negative for edema, orthopnea Dermatological: negative for rash Respiratory: see above. Urologic: negative for hematuria Abdominal: negative for nausea, vomiting, diarrhea, bright red  blood per rectum, melena, or hematemesis Neurologic: see above. No hx TIA/stroke. All other systems reviewed and are otherwise negative except as noted above.  Labs:  Recent Labs  08/01/13 0830  TROPONINI 0.84*   Lab Results  Component Value Date   WBC 14.2* 08/01/2013   HGB 10.4* 08/01/2013   HCT 31.0* 08/01/2013   MCV 81.4 08/01/2013   PLT 156 08/01/2013    Recent Labs Lab 08/01/13 0830  NA 136  K 2.9*  CL 99  CO2 25  BUN 19  CREATININE 0.98  CALCIUM 8.6  PROT 7.0  BILITOT 0.9  ALKPHOS 80  ALT 11  AST  16  GLUCOSE 112*    Radiology/Studies:  Ct Angio Chest Pe W/cm &/or Wo Cm  08/01/2013   CLINICAL DATA:  Fever, weakness, and dizziness. Symptoms are worsening today. New left rib pain. No injury. Decreased lung sounds in the lower lungs. Syncope and hypoxia.  EXAM: CT ANGIOGRAPHY CHEST WITH CONTRAST  TECHNIQUE: Multidetector CT imaging of the chest was performed using the standard protocol during bolus administration of intravenous contrast. Multiplanar CT image reconstructions including MIPs were obtained to evaluate the vascular anatomy.  CONTRAST:  80mL OMNIPAQUE IOHEXOL 350 MG/ML SOLN  COMPARISON:  None.  FINDINGS: Technically adequate study with good opacification of the central and segmental pulmonary arteries. No focal filling defects are demonstrated. No evidence of significant pulmonary embolus. There is consolidation in the left lower lung and left lingula consistent with pneumonia. Followup after resolution of acute process is recommended to exclude an underlying obstructing lesion. Airways appear patent. Mild cardiac enlargement. Normal caliber thoracic aorta. No significant lymphadenopathy in the chest. No pleural effusions. Atelectasis in the right lung base. No pneumothorax.  Incidental images through the upper abdomen demonstrate a low-attenuation lesion in the right lobe of the liver measuring 17 mm diameter. The margins are not distinct and a solid lesion is  not excluded. Consider further elective evaluation with ultrasound or MRI. Gastric band in place.  Review of the MIP images confirms the above findings.  IMPRESSION: Consolidation in the left lung consistent with pneumonia. No evidence of significant pulmonary embolus.   Electronically Signed   By: Burman Nieves M.D.   On: 08/01/2013 03:07    EKG: this AM: EKG NSR 87bpm TWI III, avF, V3-V6 - QTC 420 Post arrest: NSR 92bpm TWI III, avF but otherwise no acute ST-T changes - QTC 467  [2013 EKG did have TWI but located in III, avF, V2-V3]  Strips appear to have an R-on-T PVC then monomorphic VT then VF  Physical Exam: Blood pressure 104/68, pulse 98, temperature 100.1 F (37.8 C), temperature source Oral, resp. rate 20, height 5\' 3"  (1.6 m), weight 218 lb 0.6 oz (98.9 kg), SpO2 95.00%. General: Well developed, well nourished AAF in no acute distress. Head: Normocephalic, atraumatic, sclera non-icteric, no xanthomas, nares are without discharge.  Neck: Negative for carotid bruits. JVD not elevated. Lungs: Coarse bilaterally BS to auscultation without wheezes, rales, or rhonchi. Breathing is unlabored. Heart: RRR with S1 S2. No murmurs, rubs, or gallops appreciated. Abdomen: Soft, non-tender, non-distended with normoactive bowel sounds. No hepatomegaly. No rebound/guarding. No obvious abdominal masses. Msk:  Strength and tone appear normal for age. Extremities: No clubbing or cyanosis. No edema.  Distal pedal pulses are 2+ and equal bilaterally. Neuro: Alert and oriented X 3. No facial asymmetry. No focal deficit. Moves all extremities spontaneously. Psych:  Responds to questions appropriately with a normal affect.   Assessment and Plan:   1. Community acquired pneumonia with SIRS 2. NSTEMI 3. VT/VF arrest 4. Hypokalemia 5. HTN, controlled 6. Abnormal liver lesion on CT, f/u as per IM 7. Obesity s/p lap band surgery - BMI 38.6   Agree with the excellent care plan already implemented by  Dr. Thedore Mins who is repleting potassium/magnesium, implementing short-term amiodarone, and titrating beta blocker. We suspect ventricular arrest was related to her profound hypokalemia. Mg level pending. Repeat BMET at 6pm. At this time we feel clinical picture is atypical for ACS and that NSTEMI may be demand ischemia in the setting of PNA. However, she will need cath to exclude CAD -  timing to be determined based on clinical course of PNA. EP will round on the patient in AM.  Continue to cycle troponins but per discussion with Dr. Elease Hashimoto, hold off heparin for now in absence of acute EKG changes and CP. Will rx additional aspirin to total 324mg  today then continue 81mg  daily. Continue statin as ordered and check lipid panel. Will follow for 2D echo results.  Signed, Ronie Spies PA-C 08/01/2013, 1:13 PM   Attending Note:   The patient was seen and examined.  Agree with assessment and plan as noted above.  Changes made to the above note as needed.  Ms. Vicars presents with pneumonia.  She had a VT arrest today ( NSR with R on T which lead to very fast monomorphic VT which later degraded to coarse VF .  She was successfully defibrilated.  . She is awake and alert afterward.  No angina.  I suspect this was a primary arrhythmic event - no real evidence for ischemia.   I think it we should probably do a cardiac cath prior to DC  - .  No indication for heparin for this time.  We will see if she is ready for cath in the am  Will put her on for late morning or afternoon and then decide on rounds.    Vesta Mixer, Montez Hageman., MD, [facility] 08/01/2013, 3:39 PM

## 2013-08-02 NOTE — Consult Note (Signed)
ELECTROPHYSIOLOGY CONSULT NOTE    Patient ID: Lori Beard MRN: [id number], DOB/AGE: 03-04-1951 62 y.o.  Admit date: 08/01/2013 Date of Consult: 08-02-2013  Primary Physician: Lori Carney, MD Primary Cardiologist: new to [facility]  Reason for Consultation: VT/VF  arrest  HPI:  [name] is a 62 y/o F with history of HTN, morbid obesity s/p lapband surgery, OSA who presented to [facility] with productive cough, headache, and malaise for about a week. She was at church on Sunday 07/30/13 and just didn't feel right with headache/fatigue so came to the ED. TMax 103 at that time but no localizing signs, thus supportive care was recommended and she was discharged. She continued to feel poorly thus returned to the ER 08-01-13 with continued cough, dizziness, and some SOB. She also reports a L sided back/chest/flank pain that has been constant for a week, possibly exacerbated by coughing. CTA ruled out PE but showed LLL consolidation c/w PNA thus has been started on macrolide antibiotics this in the context of long-standing amitriptyline therapy  There is no family history of sudden death; there is no family history of syncope. And we'll treadmill was obtained a couple of years ago; these results are not available.   (Also showed mild cardiomegaly.) She was felt to have SIRS criteria with WBC 16k.     She does not exercise but otherwise denies any recent exertional sx. Troponin was ordered this morning as part of workup - 0.84. pBNP 2445. At the time that these labs returned, she was not having any further CP or SOB. Yesterday afternoon, she went into sustained polymorphic VT>>  VF. She was unresponsive with eyes rolled back in her head. Per report she received about 10 seconds of CPR then 1 shock restored ROSC & consciousness. The whole arrhythmia lasted less than 4 minutes. She denies any acute preceding CP or SOB. She denies previous history of palpitations, near syncope or  syncope. ECG nonspecific changes but nonacute. Labs yesterday were notable for a K of 2.9.  She was transferred to the ICU for further evaluation and placed on IV Amiodorone.  Her K has been repleted.  She has had no further sustained ventricular arrhythmias but has continued to have similar initiating beats and shorts runs of triggered nonsustained VT   She was evaluated by Dr Elease Hashimoto yesterday  Cath >> normal CA  And EF 35-40%  Echocardiogram yesterday demonstrated an EF of 40-45% with diffuse global hypokinesis and no significant valvular abnormalities.   There is no family history of sudden death.  She has not had prior syncope.  She does have occasional positional dizziness most consistent with vertigo.  She denies prior tachy palpitations.   EP has been asked to evaluate for treatment options.    ROS is negative except as outlined above.   Past Medical History  Diagnosis Date  . Hypertension   . Morbid obesity     a. s/p lapband surgery 11/2012.  [name] GERD (gastroesophageal reflux disease)   . Arthritis     ALL OVER  . Sleep apnea 02/15/2012    SLEEP STUDY IN EPIC - MILD OSA-CPAP NOT RECOMMENDED-HOME OXYGEN SUGGESTED BECAUSE OF OXYGEN DESATS ON ROOM AIR.     Surgical History:  Past Surgical History  Procedure Laterality Date  . Abdominal hysterectomy    . Knee arthroscopy    . Cholecystectomy    . Breath tek h pylori  02/15/2012    Procedure: BREATH TEK H PYLORI;  Surgeon: Thornton Park  Daphine Deutscher, MD;  Location: [facility];  Service: General;  Laterality: Lori Beard;  745  . Joint replacement      BILATERAL TOTAL HIP REPLACEMENTS  . Mesh applied to lap port Lori Beard 11/22/2012    Procedure: MESH APPLIED TO LAP PORT;  Surgeon: Valarie Merino, MD;  Location: [facility];  Service: General;  Laterality: Lori Beard;  . Laparoscopic gastric banding       Facility-administered medications prior to admission  Medication Dose Route Frequency Provider Last Rate Last Dose  . triamcinolone acetonide (KENALOG-40)  injection 80 mg  80 mg Intra-articular Once Otho Darner, MD       Prescriptions prior to admission  Medication Sig Dispense Refill  . amitriptyline (ELAVIL) 25 MG tablet Take 75 mg by mouth at bedtime.      [name] aspirin 325 MG tablet Take 325 mg by mouth every morning.       . furosemide (LASIX) 40 MG tablet Take 40 mg by mouth every morning.       [name] HYDROcodone-acetaminophen (NORCO/VICODIN) 5-325 MG per tablet Take 2 tablets by mouth every 6 (six) hours as needed for pain.  20 tablet  0  . metoprolol tartrate (LOPRESSOR) 25 MG tablet Take 25 mg by mouth 2 (two) times daily.      . Multiple Vitamin (MULTIVITAMIN) tablet Take 1 tablet by mouth daily.      . pantoprazole (PROTONIX) 40 MG tablet Take 40 mg by mouth every morning.       . verapamil (CALAN-SR) 240 MG CR tablet Take 240 mg by mouth every morning.        Inpatient Medications:  . amitriptyline  75 mg Oral QHS  . aspirin  81 mg Oral Daily  . atorvastatin  10 mg Oral q1800  . azithromycin  500 mg Intravenous Q24H  . cefTRIAXone (ROCEPHIN)  IV  1 g Intravenous Q24H  . enoxaparin (LOVENOX) injection  40 mg Subcutaneous Q24H  . feeding supplement (ENSURE COMPLETE)  237 mL Oral q morning - 10a  . metoprolol tartrate  50 mg Oral BID  . pantoprazole  40 mg Oral q morning - 10a  . sodium chloride  3 mL Intravenous Q12H    Allergies:  Allergies  Allergen Reactions  . Ace Inhibitors Swelling  . Iron Other (See Comments)    Gives patient migraine headaches when supplemented  . Morphine And Related Itching and Rash    History   Social History  . Marital Status: Married    Spouse Name: Lori Beard    Number of Children: Lori Beard  . Years of Education: Lori Beard   Occupational History  . Not on file.   Social History Main Topics  . Smoking status: Never Smoker   . Smokeless tobacco: Never Used  . Alcohol Use: No  . Drug Use: No  . Sexual Activity: Not Currently   Other Topics Concern  . Not on file   Social History Narrative  .  No narrative on file     Family History  Problem Relation Age of Onset  . Kidney disease Mother   . Heart disease Father   . Cancer Father     colon  . Cancer Sister     breast    BP 130/88  Pulse 72  Temp(Src) 98.1 F (36.7 C) (Oral)  Resp 15  Ht 5\' 3"  (1.6 m)  Wt 218 lb 0.6 oz (98.9 kg)  BMI 38.63 kg/m2  SpO2 100% Alert and oriented in no acute distress  HENT- normal Eyes- EOMI, without scleral icterus Skin- warm and dry; without rashes LN-neg Neck- supple without thyromegaly, JVP-flat, carotids brisk and full without bruits Back-without CVAT or kyphosis Lungs-clear to auscultation CV-Regular rate and rhythm, nl S1 and S2, no murmurs gallops or rubs, S4-absent Abd-soft with active bowel sounds; no midline pulsation or hepatomegaly Pulses-intact femoral and distal MKS-without gross deformity Neuro- Ax O, CN3-12 intact, grossly normal motor and sensory function Affect engaging \  Labs:   Lab Results  Component Value Date   WBC 14.2* 08/01/2013   HGB 10.4* 08/01/2013   HCT 31.0* 08/01/2013   MCV 81.4 08/01/2013   PLT 156 08/01/2013    Recent Labs Lab 08/01/13 0830 08/01/13 1947  NA 136 134*  K 2.9* 4.0  CL 99 99  CO2 25 26  BUN 19 19  CREATININE 0.98 0.98  CALCIUM 8.6 8.5  PROT 7.0  --   BILITOT 0.9  --   ALKPHOS 80  --   ALT 11  --   AST 16  --   GLUCOSE 112* 150*   Lab Results  Component Value Date   TROPONINI 0.49* 08/01/2013     Radiology/Studies: Ct Angio Chest Pe W/cm &/or Wo Cm 08/01/2013   CLINICAL DATA:  Fever, weakness, and dizziness. Symptoms are worsening today. New left rib pain. No injury. Decreased lung sounds in the lower lungs. Syncope and hypoxia.  EXAM: CT ANGIOGRAPHY CHEST WITH CONTRAST  TECHNIQUE: Multidetector CT imaging of the chest was performed using the standard protocol during bolus administration of intravenous contrast. Multiplanar CT image reconstructions including MIPs were obtained to evaluate the vascular anatomy.   CONTRAST:  80mL OMNIPAQUE IOHEXOL 350 MG/ML SOLN  COMPARISON:  None.  FINDINGS: Technically adequate study with good opacification of the central and segmental pulmonary arteries. No focal filling defects are demonstrated. No evidence of significant pulmonary embolus. There is consolidation in the left lower lung and left lingula consistent with pneumonia. Followup after resolution of acute process is recommended to exclude an underlying obstructing lesion. Airways appear patent. Mild cardiac enlargement. Normal caliber thoracic aorta. No significant lymphadenopathy in the chest. No pleural effusions. Atelectasis in the right lung base. No pneumothorax.  Incidental images through the upper abdomen demonstrate a low-attenuation lesion in the right lobe of the liver measuring 17 mm diameter. The margins are not distinct and a solid lesion is not excluded. Consider further elective evaluation with ultrasound or MRI. Gastric band in place.  Review of the MIP images confirms the above findings.  IMPRESSION: Consolidation in the left lung consistent with pneumonia. No evidence of significant pulmonary embolus.   Electronically Signed   By: Burman Nieves M.D.   On: 08/01/2013 03:07    ZOX:WRUEA rhythm with normal intervals, QTc  TELEMETRY: sinus rhythm with occasional PVC's, short runs NSVT   Assessment and plan  Polymorphic ventricular tachycardia  Pneumonia on macrolide antibiotics  Nonischemic cardiomyopathy  Transient hyperkalemia  Prolonged QT prolongation   The patient had polymorphic ventricular tachycardia with borderline prolonged QT but without long short coupling. She also has a nonischemic cardiomyopathy. The macrolide antibiotics in the context of her amitriptyline does not result hypokalemia each of which may   be contributing to QT prolongation which as noted is only modest.     the polymorphic ventricular tachycardia is triggered by a beat that appears to merge probably  from a His-Purkinje system and traverses her left bundle.  This mechanism has been identified previously in some patients in  about ablation of the triggering beat. The fact QT elongation is modest at most and that there is no long short coupling makes me reluctant to evoke long QT syndrome as the underlying mechanism--I. am much more concerned about the possibility that it is related to her nonischemic cardiomyopathy. Hence, although I would discontinue her amitriptyline and change her antibiotics I think is secondary prevention for polymorphic ventricular tachycardia is indicated not withstanding. She was continued also to have nonsustained ventricular tachycardia with similar triggering beats in the context of result hypokalemia  I reviewed this with the patient and her family.  I further suggested that she not be allowed to drive

## 2013-08-02 NOTE — CV Procedure (Signed)
   Cardiac Catheterization Procedure Note  Name: Lori Beard MRN: [id number] DOB: [birth year]  Procedure: Left Heart Cath, Selective Coronary Angiography, LV angiography  Indication: Cardiac arrest due to ventricular tachycardia with mildly elevated cardiac enzymes.  Medications:  Sedation:   1 mg IV Versed, 25 mcg IV Fentanyl  Contrast:  55 ml Omnipaque   Procedural Details: The right wrist was prepped, draped, and anesthetized with 1% lidocaine. Using the modified Seldinger technique, a 5 French sheath was introduced into the right radial artery. 3 mg of verapamil was administered through the sheath, weight-based unfractionated heparin was administered intravenously. A Jackie catheter was used for selective coronary angiography. A pigtail catheter was used for left ventriculography. Catheter exchanges were performed over an exchange length guidewire. There were no immediate procedural complications. A TR band was used for radial hemostasis at the completion of the procedure.  The patient was transferred to the post catheterization recovery area for further monitoring.  Procedural Findings:  Hemodynamics: AO:   113/74   mmHg LV:   110/13    mmHg LVEDP:  17  mmHg  Coronary angiography: Coronary dominance:  right   Left Main:   normal  Left Anterior Descending (LAD):   normal in size with no significant disease.  1st diagonal (D1):   large in size with no significant disease.  2nd diagonal (D2):   normal in size with no significant disease.  3rd diagonal (D3):   very small in size.  Circumflex (LCx):   normal in size and nondominant. The vessel has no significant disease.  1st obtuse marginal:    small in size with no significant disease.  2nd obtuse marginal:   medium in size with no significant disease.  3rd obtuse marginal:   normal in size with no significant disease.   Right Coronary Artery:  large in size and dominant. The vessel has no significant  disease.  Posterior descending artery:  large in size and normal.  Posterior AV segment:  large in size and normal.  Posterolateral branchs:   no significant disease.  Left ventriculography: the left ventricle appears to be mildly dilated.  Left ventricular systolic function is moderately reduced , LVEF is estimated at 35 %, there is mild mitral regurgitation .   Final Conclusions:    1. Normal coronary arteries. 2. Moderately reduced LV systolic function with an estimated ejection fraction of 35%. Mildly elevated left ventricular end-diastolic pressure.  Recommendations:   The patient has nonischemic cardiomyopathy. Recommend EP consultation to consider ICD placement for secondary prevention. I discussed the case with Dr. Graciela Husbands.  [name] MD, [facility] 08/02/2013, 4:03 PM

## 2013-08-02 NOTE — Interval H&P Note (Signed)
Cath Lab Visit (complete for each Cath Lab visit)  Clinical Evaluation Leading to the Procedure:   ACS: yes  Non-ACS:    Anginal Classification: CCS IV  Anti-ischemic medical therapy: No Therapy  Non-Invasive Test Results: No non-invasive testing performed  Prior CABG: No previous CABG      History and Physical Interval Note:  08/02/2013 3:28 PM  Lori Beard  has presented today for surgery, with the diagnosis of Chest pain  The various methods of treatment have been discussed with the patient and family. After consideration of risks, benefits and other options for treatment, the patient has consented to  Procedure(s): LEFT HEART CATHETERIZATION WITH CORONARY ANGIOGRAM (N/A) as a surgical intervention .  The patient's history has been reviewed, patient examined, no change in status, stable for surgery.  I have reviewed the patient's chart and labs.  Questions were answered to the patient's satisfaction.     [name]

## 2013-08-02 NOTE — Progress Notes (Signed)
Assessment/Plan: Principal Problem:   Pneumonia Active Problems:   Ventricular tachycardia arrest   HTN (hypertension)   Anemia   Renal failure, acute   Hypokalemia   Thrombocytopenia Elevated troponin Mild systolic dysfunction  Stable. Potassium corrected. Renal failure resolved. For cath today.  Appreciate cardiology.  Continue SDU for now. Continue abx.  No evidence for CHF   Subjective: Cough with mild hemoptysis. No CP, dyspnea or other complaints  Objective: Vital signs in last 24 hours: Temp:  [97.7 F (36.5 C)-98.8 F (37.1 C)] 98.1 F (36.7 C) (11/26 0744) Pulse Rate:  [63-94] 64 (11/26 0800) Resp:  [10-23] 15 (11/26 0800) BP: (93-148)/(53-92) 108/66 mmHg (11/26 0700) SpO2:  [91 %-100 %] 98 % (11/26 0800) Weight change:  Last BM Date: 07/31/13  Intake/Output from previous day: 11/25 0701 - 11/26 0700 In: 2532.9 [P.O.:480; I.V.:1352.9; IV Piggyback:700] Out: 950 [Urine:950] Intake/Output this shift: Total I/O In: 91.7 [I.V.:91.7] Out: -   Tele: NSR with PVC  Gen: asleep. arousable Lungs CTA CV RRR without MGR Lungs CTA without WRR Abd S, NT, ND Ext SCDs. No CCE  Lab Results:  Recent Labs  08/01/13 0830 08/02/13 0617  WBC 14.2* 12.0*  HGB 10.4* 9.3*  HCT 31.0* 29.8*  PLT 156 153   BMET  Recent Labs  08/01/13 1947 08/02/13 0617  NA 134* 135  K 4.0 4.4  CL 99 102  CO2 26 23  GLUCOSE 150* 113*  BUN 19 20  CREATININE 0.98 0.97  CALCIUM 8.5 8.6    Studies/Results: Ct Angio Chest Pe W/cm &/or Wo Cm  08/01/2013   CLINICAL DATA:  Fever, weakness, and dizziness. Symptoms are worsening today. New left rib pain. No injury. Decreased lung sounds in the lower lungs. Syncope and hypoxia.  EXAM: CT ANGIOGRAPHY CHEST WITH CONTRAST  TECHNIQUE: Multidetector CT imaging of the chest was performed using the standard protocol during bolus administration of intravenous contrast. Multiplanar CT image reconstructions including MIPs were obtained to  evaluate the vascular anatomy.  CONTRAST:  80mL OMNIPAQUE IOHEXOL 350 MG/ML SOLN  COMPARISON:  None.  FINDINGS: Technically adequate study with good opacification of the central and segmental pulmonary arteries. No focal filling defects are demonstrated. No evidence of significant pulmonary embolus. There is consolidation in the left lower lung and left lingula consistent with pneumonia. Followup after resolution of acute process is recommended to exclude an underlying obstructing lesion. Airways appear patent. Mild cardiac enlargement. Normal caliber thoracic aorta. No significant lymphadenopathy in the chest. No pleural effusions. Atelectasis in the right lung base. No pneumothorax.  Incidental images through the upper abdomen demonstrate a low-attenuation lesion in the right lobe of the liver measuring 17 mm diameter. The margins are not distinct and a solid lesion is not excluded. Consider further elective evaluation with ultrasound or MRI. Gastric band in place.  Review of the MIP images confirms the above findings.  IMPRESSION: Consolidation in the left lung consistent with pneumonia. No evidence of significant pulmonary embolus.   Electronically Signed   By: Burman Nieves M.D.   On: 08/01/2013 03:07   Echo Left ventricle: The cavity size was normal. Wall thickness was increased in a pattern of mild LVH. No definite regionality, looks like global hypokinesis. Systolic function was mildly to moderately reduced. The estimated ejection fraction was in the range of 40% to 45%. Doppler parameters are consistent with abnormal left ventricular relaxation (grade 1 diastolic dysfunction). - Aortic valve: There was no stenosis. - Mitral valve: Mildly calcified annulus. Normal thickness  leaflets . No significant regurgitation. - Right ventricle: The cavity size was mildly dilated. Systolic function was mildly reduced. - Tricuspid valve: Peak RV-RA gradient:12mm Hg (S). - Pulmonary arteries: PA systolic  pressure 31-35 mmHg. - Systemic veins: IVC measured 2.4 cm with < 50% respirophasic variation, suggesting RA pressure 11-15 mmHg. Impressions:  - Normal LV size with mild LV hypertrophy. EF 40-45% with no definite regionality, looks like global hypokinesis. Mildly dilated RV with mildly decreased systolic function. No significant valvular abnormalities.  Medications:  Scheduled Meds: . amitriptyline  75 mg Oral QHS  . aspirin  81 mg Oral Daily  . atorvastatin  10 mg Oral q1800  . azithromycin  500 mg Intravenous Q24H  . cefTRIAXone (ROCEPHIN)  IV  1 g Intravenous Q24H  . enoxaparin (LOVENOX) injection  40 mg Subcutaneous Q24H  . feeding supplement (ENSURE COMPLETE)  237 mL Oral q morning - 10a  . metoprolol tartrate  50 mg Oral BID  . pantoprazole  40 mg Oral q morning - 10a  . sodium chloride  3 mL Intravenous Q12H   Continuous Infusions: . sodium chloride 75 mL/hr at 08/02/13 0700  . amiodarone (NEXTERONE PREMIX) 360 mg/200 mL dextrose 30 mg/hr (08/02/13 0700)   PRN Meds:.sodium chloride, sodium chloride, acetaminophen, acetaminophen, HYDROcodone-acetaminophen, ondansetron (ZOFRAN) IV, ondansetron   LOS: 1 day   Christiane Ha, M.D. 08/02/2013, 9:13 AM

## 2013-08-03 DIAGNOSIS — I428 Other cardiomyopathies: Secondary | ICD-10-CM | POA: Diagnosis present

## 2013-08-03 LAB — BASIC METABOLIC PANEL
BUN: 15 mg/dL (ref 6–23)
CO2: 26 mEq/L (ref 19–32)
Calcium: 8.5 mg/dL (ref 8.4–10.5)
Glucose, Bld: 109 mg/dL — ABNORMAL HIGH (ref 70–99)
Sodium: 136 mEq/L (ref 135–145)

## 2013-08-03 LAB — CBC
HCT: 29.5 % — ABNORMAL LOW (ref 36.0–46.0)
MCH: 26.6 pg (ref 26.0–34.0)
MCV: 84.5 fL (ref 78.0–100.0)
Platelets: 172 10*3/uL (ref 150–400)
RBC: 3.49 MIL/uL — ABNORMAL LOW (ref 3.87–5.11)
WBC: 9.7 10*3/uL (ref 4.0–10.5)

## 2013-08-03 MED ORDER — PNEUMOCOCCAL 13-VAL CONJ VACC IM SUSP
0.5000 mL | INTRAMUSCULAR | Status: AC
  Administered 2013-08-05: 0.5 mL via INTRAMUSCULAR
  Filled 2013-08-03 (×2): qty 0.5

## 2013-08-03 MED ORDER — DOXYCYCLINE HYCLATE 100 MG PO TABS
100.0000 mg | ORAL_TABLET | Freq: Two times a day (BID) | ORAL | Status: DC
  Administered 2013-08-03 – 2013-08-05 (×5): 100 mg via ORAL
  Filled 2013-08-03 (×6): qty 1

## 2013-08-03 MED ORDER — BENZONATATE 100 MG PO CAPS
100.0000 mg | ORAL_CAPSULE | Freq: Three times a day (TID) | ORAL | Status: DC
  Administered 2013-08-03 – 2013-08-05 (×6): 100 mg via ORAL
  Filled 2013-08-03 (×9): qty 1

## 2013-08-03 NOTE — Progress Notes (Signed)
Assessment/Plan: Principal Problem:   Pneumonia: Azithromycin stopped by Dr. Marikay Alar. Continue Rocephin and add doxycycline to cover atypicals. Add Lawyer. Active Problems:   Ventricular tachycardia arrest: For pacemaker tomorrow.   HTN (hypertension)   Anemia   Elevated troponin: Cardiac catheterization shows clean coronary arteries.   Renal failure, acute resolved   Hypokalemia corrected   Thrombocytopenia Elevated troponin Nonischemic cardiomyopathy  Continue step down monitoring  Subjective: No chest pain or dyspnea. Complaining of cough, less blood-tinged  Objective: Vital signs in last 24 hours: Temp:  [98.1 F (36.7 C)-99.3 F (37.4 C)] 98.3 F (36.8 C) (11/27 0800) Pulse Rate:  [61-80] 80 (11/27 0839) Resp:  [12-21] 15 (11/27 0800) BP: (112-142)/(70-92) 119/71 mmHg (11/27 0839) SpO2:  [90 %-100 %] 100 % (11/27 0800) Weight change:  Last BM Date: 07/31/13  Intake/Output from previous day: 11/26 0701 - 11/27 0700 In: 1833.8 [P.O.:80; I.V.:1753.8] Out: 300 [Urine:300] Intake/Output this shift:   Tele: NSR with PVC  Gen: asleep in chair Lungs left base with rales. No wheezes or rhonchi. CV RRR without MGR Lungs CTA without WRR Abd S, NT, ND Ext SCDs. No CCE  Lab Results:  Recent Labs  08/02/13 0617 08/03/13 0440  WBC 12.0* 9.7  HGB 9.3* 9.3*  HCT 29.8* 29.5*  PLT 153 172   BMET  Recent Labs  08/02/13 0617 08/03/13 0440  NA 135 136  K 4.4 3.9  CL 102 102  CO2 23 26  GLUCOSE 113* 109*  BUN 20 15  CREATININE 0.97 0.80  CALCIUM 8.6 8.5    Studies/Results: No results found. Echo Left ventricle: The cavity size was normal. Wall thickness was increased in a pattern of mild LVH. No definite regionality, looks like global hypokinesis. Systolic function was mildly to moderately reduced. The estimated ejection fraction was in the range of 40% to 45%. Doppler parameters are consistent with abnormal left ventricular relaxation (grade 1  diastolic dysfunction). - Aortic valve: There was no stenosis. - Mitral valve: Mildly calcified annulus. Normal thickness leaflets . No significant regurgitation. - Right ventricle: The cavity size was mildly dilated. Systolic function was mildly reduced. - Tricuspid valve: Peak RV-RA gradient:72mm Hg (S). - Pulmonary arteries: PA systolic pressure 31-35 mmHg. - Systemic veins: IVC measured 2.4 cm with < 50% respirophasic variation, suggesting RA pressure 11-15 mmHg. Impressions:  - Normal LV size with mild LV hypertrophy. EF 40-45% with no definite regionality, looks like global hypokinesis. Mildly dilated RV with mildly decreased systolic function. No significant valvular abnormalities.  Medications:  Scheduled Meds: . amitriptyline  75 mg Oral QHS  . aspirin  81 mg Oral Daily  . atorvastatin  10 mg Oral q1800  . carvedilol  12.5 mg Oral BID WC  . cefTRIAXone (ROCEPHIN)  IV  1 g Intravenous Q24H  . enoxaparin (LOVENOX) injection  40 mg Subcutaneous Q24H  . feeding supplement (ENSURE COMPLETE)  237 mL Oral q morning - 10a  . isosorbide-hydrALAZINE  1 tablet Oral BID  . pantoprazole  40 mg Oral q morning - 10a  . sodium chloride  3 mL Intravenous Q12H   Continuous Infusions: . amiodarone (NEXTERONE PREMIX) 360 mg/200 mL dextrose 30 mg/hr (08/03/13 0607)   PRN Meds:.sodium chloride, acetaminophen, acetaminophen, HYDROcodone-acetaminophen, ondansetron (ZOFRAN) IV, ondansetron   LOS: 2 days   Christiane Ha, M.D. 08/03/2013, 9:09 AM

## 2013-08-03 NOTE — Progress Notes (Signed)
Patient ID: Lori Beard, female   DOB: 08/27/1951, 62 y.o.   MRN: [id number] Subjective:  Minimal cough but otherwise denies chest pain, sob or fever  Objective:  Vital Signs in the last 24 hours: Temp:  [98.1 F (36.7 C)-99.3 F (37.4 C)] 99 F (37.2 C) (11/27 0400) Pulse Rate:  [61-78] 73 (11/27 0600) Resp:  [12-21] 12 (11/27 0600) BP: (112-142)/(70-92) 128/78 mmHg (11/27 0600) SpO2:  [90 %-100 %] 99 % (11/27 0600)  Intake/Output from previous day: 11/26 0701 - 11/27 0700 In: 1833.8 [P.O.:80; I.V.:1753.8] Out: 300 [Urine:300] Intake/Output from this shift:    Physical Exam: Well appearing middle aged woman, NAD HEENT: Unremarkable Neck:  No JVD, no thyromegally Back:  No CVA tenderness Lungs:  Clear with no wheezes HEART:  Regular rate rhythm, no murmurs, no rubs, no clicks Abd:  Flat, positive bowel sounds, no organomegally, no rebound, no guarding Ext:  2 plus pulses, no edema, no cyanosis, no clubbing Skin:  No rashes no nodules Neuro:  CN II through XII intact, motor grossly intact  Lab Results:  Recent Labs  08/02/13 0617 08/03/13 0440  WBC 12.0* 9.7  HGB 9.3* 9.3*  PLT 153 172    Recent Labs  08/02/13 0617 08/03/13 0440  NA 135 136  K 4.4 3.9  CL 102 102  CO2 23 26  GLUCOSE 113* 109*  BUN 20 15  CREATININE 0.97 0.80    Recent Labs  08/01/13 1406 08/01/13 1947  TROPONINI 0.73* 0.49*   Hepatic Function Panel  Recent Labs  08/01/13 0830  PROT 7.0  ALBUMIN 2.7*  AST 16  ALT 11  ALKPHOS 80  BILITOT 0.9    Recent Labs  08/02/13 0617  CHOL 111   No results found for this basename: PROTIME,  in the last 72 hours  Imaging: No results found.  Cardiac Studies: Tele - nsr Assessment/Plan:  1. PMVT/VF arrest - s/p defibrillation, doing well 2. Non-ischemic CM, LV dysfunction with no obstructive CAD 3. HTN 4. Pneumonia Rec: as long as she has no fever, will plan ICD implant tomorrow. I have discussed the indications,  risks,benefits, goals, and expectations of the procedure with the patient and she wishes to proceed.  LOS: 2 days    Mliss Wedin,M.D. 08/03/2013, 7:51 AM    

## 2013-08-04 ENCOUNTER — Encounter (HOSPITAL_COMMUNITY)
Admission: EM | Disposition: A | Discharge status: Home / Self Care | Payer: Self-pay | Source: Home / Self Care | Attending: Internal Medicine

## 2013-08-04 DIAGNOSIS — I472 Ventricular tachycardia: Secondary | ICD-10-CM

## 2013-08-04 HISTORY — PX: IMPLANTABLE CARDIOVERTER DEFIBRILLATOR IMPLANT: SHX5473

## 2013-08-04 HISTORY — PX: PACEMAKER INSERTION: SHX728

## 2013-08-04 LAB — CBC
MCH: 26.4 pg (ref 26.0–34.0)
MCHC: 31.8 g/dL (ref 30.0–36.0)
MCV: 83.2 fL (ref 78.0–100.0)
Platelets: 220 10*3/uL (ref 150–400)
RDW: 15.2 % (ref 11.5–15.5)

## 2013-08-04 LAB — CREATININE, SERUM: Creatinine, Ser: 0.75 mg/dL (ref 0.50–1.10)

## 2013-08-04 SURGERY — IMPLANTABLE CARDIOVERTER DEFIBRILLATOR IMPLANT
Anesthesia: LOCAL

## 2013-08-04 MED ORDER — LIDOCAINE HCL (PF) 1 % IJ SOLN
INTRAMUSCULAR | Status: AC
  Filled 2013-08-04: qty 60

## 2013-08-04 MED ORDER — YOU HAVE A PACEMAKER BOOK
Freq: Once | Status: AC
  Administered 2013-08-04: 20:00:00
  Filled 2013-08-04: qty 1

## 2013-08-04 MED ORDER — SODIUM CHLORIDE 0.9 % IV SOLN: INTRAVENOUS | Status: DC

## 2013-08-04 MED ORDER — ACETAMINOPHEN 325 MG PO TABS
325.0000 mg | ORAL_TABLET | ORAL | Status: DC | PRN
  Administered 2013-08-04: 650 mg via ORAL
  Filled 2013-08-04: qty 2

## 2013-08-04 MED ORDER — FENTANYL CITRATE 0.05 MG/ML IJ SOLN
INTRAMUSCULAR | Status: AC
  Filled 2013-08-04: qty 2

## 2013-08-04 MED ORDER — FUROSEMIDE 10 MG/ML IJ SOLN
40.0000 mg | Freq: Once | INTRAMUSCULAR | Status: AC
  Administered 2013-08-04: 40 mg via INTRAVENOUS
  Filled 2013-08-04: qty 4

## 2013-08-04 MED ORDER — CEFAZOLIN SODIUM-DEXTROSE 2-3 GM-% IV SOLR
2.0000 g | INTRAVENOUS | Status: DC
  Filled 2013-08-04: qty 50

## 2013-08-04 MED ORDER — CEFAZOLIN SODIUM 1-5 GM-% IV SOLN
1.0000 g | Freq: Four times a day (QID) | INTRAVENOUS | Status: AC
  Administered 2013-08-04 (×2): 1 g via INTRAVENOUS
  Filled 2013-08-04 (×3): qty 50

## 2013-08-04 MED ORDER — FENTANYL CITRATE 0.05 MG/ML IJ SOLN: 25.0000 ug | INTRAMUSCULAR | Status: DC | PRN

## 2013-08-04 MED ORDER — CEFAZOLIN SODIUM-DEXTROSE 2-3 GM-% IV SOLR: 2.0000 g | INTRAVENOUS | Status: DC

## 2013-08-04 MED ORDER — CHLORHEXIDINE GLUCONATE 4 % EX LIQD
60.0000 mL | Freq: Once | CUTANEOUS | Status: AC
  Administered 2013-08-04: 4 via TOPICAL
  Filled 2013-08-04: qty 60

## 2013-08-04 MED ORDER — CHLORHEXIDINE GLUCONATE 4 % EX LIQD
60.0000 mL | Freq: Once | CUTANEOUS | Status: AC
  Administered 2013-08-04: 4 via TOPICAL

## 2013-08-04 MED ORDER — SODIUM CHLORIDE 0.9 % IV SOLN
INTRAVENOUS | Status: DC
  Administered 2013-08-04: 50 mL/h via INTRAVENOUS

## 2013-08-04 MED ORDER — ONDANSETRON HCL 4 MG/2ML IJ SOLN: 4.0000 mg | Freq: Four times a day (QID) | INTRAMUSCULAR | Status: DC | PRN

## 2013-08-04 MED ORDER — MIDAZOLAM HCL 5 MG/5ML IJ SOLN
INTRAMUSCULAR | Status: AC
  Filled 2013-08-04: qty 5

## 2013-08-04 MED ORDER — SODIUM CHLORIDE 0.9 % IR SOLN
80.0000 mg | Status: DC
  Filled 2013-08-04: qty 2

## 2013-08-04 MED ORDER — SODIUM CHLORIDE 0.9 % IR SOLN: 80.0000 mg | Status: DC

## 2013-08-04 MED ORDER — ENOXAPARIN SODIUM 40 MG/0.4ML ~~LOC~~ SOLN
40.0000 mg | SUBCUTANEOUS | Status: DC
  Administered 2013-08-05: 40 mg via SUBCUTANEOUS
  Filled 2013-08-04 (×2): qty 0.4

## 2013-08-04 NOTE — Op Note (Signed)
Lori Beard, HANSSEN NO.:  [id number]  MEDICAL RECORD NO.:  [id number]  LOCATION:  [facility]                         FACILITY:  [facility]  PHYSICIAN:  Doylene Canning. [name], MD    DATE OF BIRTH:  27-May-1951  DATE OF PROCEDURE:  08/04/2013 DATE OF DISCHARGE:                              OPERATIVE REPORT   PROCEDURE PERFORMED:  Insertion of a single-chamber ICD via the left subclavian vein in a patient with resuscitated ventricular tachycardiac arrest.  INTRODUCTION:  The patient is a 62 year old woman who was admitted to the hospital with pneumonia and found to have left ventricular dysfunction.  After she sustained a polymorphic ventricular tachycardiac arrest.  The patient quickly degenerated into ventricular fibrillation and was successfully resuscitated being defibrillated back to sinus rhythm.  She underwent catheterization demonstrating no obstructive coronary disease.  She did have an ejection fraction of 35-40% by both echo and catheterization and she is now referred for insertion of an ICD for secondary prevention of malignant ventricular arrhythmias.  PROCEDURE:  After informed consent was obtained, the patient was taken to the diagnostic EP lab in a fasting state.  After usual preparation and draping, intravenous fentanyl and midazolam was given for sedation. A 30 mL of lidocaine was infiltrated into the left infraclavicular region.  A 7-cm incision was carried out and electrocautery utilized to dissect down to the fascial plane.  The left subclavian vein was then punctured and the St. [name] model 7122 60 cm active fixation single coil defibrillation lead, serial number [id number], being advanced into the right ventricle.  Mapping was carried out in the final site on the RV apical septum, where the R-waves measured 9 mV.  The pacing impedance with the lead actively fixed was 500 ohms and the threshold 0.7 V at 0.5 milliseconds.  A 10 V pacing not stimulated the  diaphragm.  With the ventricular lead in satisfactory position, it was secured to the subpectoral fascia with a figure-of-eight silk suture and the sewing sleeve was secured with silk suture.  Electrocautery was utilized to make subcutaneous pocket.  Antibiotic irrigation was utilized to irrigate the pocket.  Electrocautery was utilized to assure hemostasis. The St. [name] single-chamber defibrillator serial [id number] was connected to the defibrillation lead and placed back in the subcutaneous pocket. The pocket was irrigated with antibiotic irrigation and the incision was closed with 2-0 and 3-0 Vicryl suture.  Benzoin and Steri-Strips were painted on the skin.  At this point, I scrubbed out of the case to supervise defibrillation threshold testing.  The patient was more deeply sedated under my direct supervision with intravenous fentanyl and Versed, ventricular fibrillation was then induced with a T-wave shock. A 20-joule shock was delivered, which terminated ventricular fibrillation and restored sinus rhythm.  No additional defibrillation threshold testing was carried out and this patient was returned to the recovery area in satisfactory condition.  COMPLICATIONS:  There were no immediate procedure complications.  RESULTS:  Demonstrates successful implantation of a St. [name] single- chamber defibrillator in a patient with nonischemic cardiomyopathy, status post ventricular fibrillation cardiac arrest.     Doylene Canning. [name], MD     GWT/MEDQ  D:  08/04/2013  T:  08/04/2013  Job:  [id number]

## 2013-08-04 NOTE — Interval H&P Note (Signed)
History and Physical Interval Note:  08/04/2013 10:33 AM  Lori Beard  has presented today for surgery, with the diagnosis of ca  The various methods of treatment have been discussed with the patient and family. After consideration of risks, benefits and other options for treatment, the patient has consented to  Procedure(s): IMPLANTABLE CARDIOVERTER DEFIBRILLATOR IMPLANT (N/A) as a surgical intervention .  The patient's history has been reviewed, patient examined, no change in status, stable for surgery.  I have reviewed the patient's chart and labs.  Questions were answered to the patient's satisfaction.     Leonia Reeves.D.

## 2013-08-04 NOTE — CV Procedure (Signed)
ICD implant performed via the left subclavian vein without immediate complication. [id number].

## 2013-08-04 NOTE — Progress Notes (Signed)
Assessment/Plan: Principal Problem:   Pneumonia: continue current  Active Problems:   Ventricular tachycardia arrest: s/p ICD   HTN (hypertension)   Anemia   Elevated troponin: Cardiac catheterization shows clean coronary arteries.   Renal failure, acute resolved   Hypokalemia corrected   Thrombocytopenia Elevated troponin Nonischemic cardiomyopathy  Subjective: No chest pain or dyspnea.   Objective: Vital signs in last 24 hours: Temp:  [98.4 F (36.9 C)-99.1 F (37.3 C)] 98.6 F (37 C) (11/28 1215) Pulse Rate:  [66-81] 75 (11/28 1230) Resp:  [15-21] 18 (11/28 1215) BP: (117-189)/(68-99) 178/99 mmHg (11/28 1230) SpO2:  [78 %-100 %] 88 % (11/28 1230) Weight:  [102.1 kg (225 lb 1.4 oz)] 102.1 kg (225 lb 1.4 oz) (11/28 0400) Weight change:  Last BM Date: 08/03/13  Intake/Output from previous day: 11/27 0701 - 11/28 0700 In: 786.8 [I.V.:686.8; IV Piggyback:100] Out: 1150 [Urine:1150] Intake/Output this shift: Total I/O In: 320.1 [P.O.:240; I.V.:80.1] Out: 550 [Urine:550] Tele: NSR with PVC  Gen: asleep in chair Lungs left base with rales. No wheezes or rhonchi. CV RRR without MGR Lungs CTA without WRR Abd S, NT, ND Ext SCDs. No CCE  Lab Results:  Recent Labs  08/02/13 0617 08/03/13 0440  WBC 12.0* 9.7  HGB 9.3* 9.3*  HCT 29.8* 29.5*  PLT 153 172   BMET  Recent Labs  08/02/13 0617 08/03/13 0440  NA 135 136  K 4.4 3.9  CL 102 102  CO2 23 26  GLUCOSE 113* 109*  BUN 20 15  CREATININE 0.97 0.80  CALCIUM 8.6 8.5    Studies/Results: No results found.  Medications:  Scheduled Meds: . amitriptyline  75 mg Oral QHS  . aspirin  81 mg Oral Daily  . atorvastatin  10 mg Oral q1800  . benzonatate  100 mg Oral TID  . carvedilol  12.5 mg Oral BID WC  .  ceFAZolin (ANCEF) IV  1 g Intravenous Q6H  . cefTRIAXone (ROCEPHIN)  IV  1 g Intravenous Q24H  . doxycycline  100 mg Oral Q12H  . [START ON 08/05/2013] enoxaparin (LOVENOX) injection  40 mg  Subcutaneous Q24H  . feeding supplement (ENSURE COMPLETE)  237 mL Oral q morning - 10a  . isosorbide-hydrALAZINE  1 tablet Oral BID  . pantoprazole  40 mg Oral q morning - 10a  . pneumococcal 13-valent conjugate vaccine  0.5 mL Intramuscular Tomorrow-1000  . sodium chloride  3 mL Intravenous Q12H   Continuous Infusions: . amiodarone (NEXTERONE PREMIX) 360 mg/200 mL dextrose 30 mg/hr (08/03/13 2103)   PRN Meds:.sodium chloride, acetaminophen, fentaNYL, HYDROcodone-acetaminophen, ondansetron (ZOFRAN) IV   LOS: 3 days   Christiane Ha, M.D. 08/04/2013, 1:47 PM

## 2013-08-04 NOTE — H&P (View-Only) (Signed)
Patient ID: Lori Beard, female   DOB: [birth year], 62 y.o.   MRN: [id number] Subjective:  Minimal cough but otherwise denies chest pain, sob or fever  Objective:  Vital Signs in the last 24 hours: Temp:  [98.1 F (36.7 C)-99.3 F (37.4 C)] 99 F (37.2 C) (11/27 0400) Pulse Rate:  [61-78] 73 (11/27 0600) Resp:  [12-21] 12 (11/27 0600) BP: (112-142)/(70-92) 128/78 mmHg (11/27 0600) SpO2:  [90 %-100 %] 99 % (11/27 0600)  Intake/Output from previous day: 11/26 0701 - 11/27 0700 In: 1833.8 [P.O.:80; I.V.:1753.8] Out: 300 [Urine:300] Intake/Output from this shift:    Physical Exam: Well appearing middle aged woman, NAD HEENT: Unremarkable Neck:  No JVD, no thyromegally Back:  No CVA tenderness Lungs:  Clear with no wheezes HEART:  Regular rate rhythm, no murmurs, no rubs, no clicks Abd:  Flat, positive bowel sounds, no organomegally, no rebound, no guarding Ext:  2 plus pulses, no edema, no cyanosis, no clubbing Skin:  No rashes no nodules Neuro:  CN II through XII intact, motor grossly intact  Lab Results:  Recent Labs  08/02/13 0617 08/03/13 0440  WBC 12.0* 9.7  HGB 9.3* 9.3*  PLT 153 172    Recent Labs  08/02/13 0617 08/03/13 0440  NA 135 136  K 4.4 3.9  CL 102 102  CO2 23 26  GLUCOSE 113* 109*  BUN 20 15  CREATININE 0.97 0.80    Recent Labs  08/01/13 1406 08/01/13 1947  TROPONINI 0.73* 0.49*   Hepatic Function Panel  Recent Labs  08/01/13 0830  PROT 7.0  ALBUMIN 2.7*  AST 16  ALT 11  ALKPHOS 80  BILITOT 0.9    Recent Labs  08/02/13 0617  CHOL 111   No results found for this basename: PROTIME,  in the last 72 hours  Imaging: No results found.  Cardiac Studies: Tele - nsr Assessment/Plan:  1. PMVT/VF arrest - s/p defibrillation, doing well 2. Non-ischemic CM, LV dysfunction with no obstructive CAD 3. HTN 4. Pneumonia Rec: as long as she has no fever, will plan ICD implant tomorrow. I have discussed the indications,  risks,benefits, goals, and expectations of the procedure with the patient and she wishes to proceed.  LOS: 2 days    Gregg Taylor,M.D. 08/03/2013, 7:51 AM

## 2013-08-05 ENCOUNTER — Encounter (HOSPITAL_COMMUNITY): Payer: Self-pay | Admitting: Physician Assistant

## 2013-08-05 ENCOUNTER — Inpatient Hospital Stay (HOSPITAL_COMMUNITY): Payer: 59

## 2013-08-05 DIAGNOSIS — J984 Other disorders of lung: Secondary | ICD-10-CM | POA: Diagnosis not present

## 2013-08-05 DIAGNOSIS — I428 Other cardiomyopathies: Secondary | ICD-10-CM

## 2013-08-05 DIAGNOSIS — Z452 Encounter for adjustment and management of vascular access device: Secondary | ICD-10-CM | POA: Diagnosis not present

## 2013-08-05 LAB — BASIC METABOLIC PANEL
BUN: 8 mg/dL (ref 6–23)
CO2: 30 mEq/L (ref 19–32)
Calcium: 8.9 mg/dL (ref 8.4–10.5)
GFR calc Af Amer: >90 mL/min (ref >90)
GFR calc non Af Amer: 90 mL/min — ABNORMAL LOW (ref >90)
Glucose, Bld: 97 mg/dL (ref 70–99)
Potassium: 3.4 mEq/L — ABNORMAL LOW (ref 3.5–5.1)

## 2013-08-05 MED ORDER — CARVEDILOL 12.5 MG PO TABS: 12.5000 mg | ORAL_TABLET | Freq: Two times a day (BID) | ORAL | Status: DC

## 2013-08-05 MED ORDER — ATORVASTATIN CALCIUM 10 MG PO TABS: 10.0000 mg | ORAL_TABLET | Freq: Every day | ORAL | Status: DC

## 2013-08-05 MED ORDER — ACETAMINOPHEN 325 MG PO TABS: 325.0000 mg | ORAL_TABLET | ORAL | Status: DC | PRN

## 2013-08-05 MED ORDER — ISOSORB DINITRATE-HYDRALAZINE 20-37.5 MG PO TABS: 1.0000 | ORAL_TABLET | Freq: Two times a day (BID) | ORAL | Status: DC

## 2013-08-05 MED ORDER — CEFUROXIME AXETIL 500 MG PO TABS: 500.0000 mg | ORAL_TABLET | Freq: Two times a day (BID) | ORAL | Status: DC

## 2013-08-05 MED ORDER — BENZONATATE 100 MG PO CAPS: 100.0000 mg | ORAL_CAPSULE | Freq: Three times a day (TID) | ORAL | Status: DC | PRN

## 2013-08-05 NOTE — Discharge Summary (Signed)
Physician Discharge Summary  RANDALYN AHMED OAC:[id number] DOB: November 05, 1950 DOA: 08/01/2013  PCP: Lupe Carney, MD  Admit date: 08/01/2013 Discharge date: 08/05/2013  Time spent: greater than 30 minutes  Recommendations for Outpatient Follow-up:  Monitor BMET  Discharge Diagnoses:  Principal Problem:   Community acquired Pneumonia Active Problems:   Ventricular tachycardia arrest   HTN (hypertension)   Anemia   Elevated troponin   NICM (nonischemic cardiomyopathy)   Hypokalemia  Discharge Condition: stable  Filed Weights   07/31/13 2257 08/01/13 0545 08/04/13 0400  Weight: 97.523 kg (215 lb) 98.9 kg (218 lb 0.6 oz) 102.1 kg (225 lb 1.4 oz)   History of present illness:  62 y.o. female with history of hypertension has been experiencing productive cough with headache and dizziness for last 2 days. Patient did come 2 days ago to the ER and was discharged home. Last night when patient was trying to walk she felt dizzy and fell but did not lose consciousness and was brought to the ER. Patient was complaining of mild shortness of breath with mild pleuritic type of chest pain on the left side. CT angiogram of the chest was done to rule out PE which was negative but did show pneumonic process. Patient also has been having fever with leukocytosis. Patient has been admitted for IV antibiotics for pneumonia. Patient states that she still has mild headache. Presently chest pain-free and not in acute distress. Labs also show anemia with thrombocytopenia with mild renal dysfunction. Patient denies having had any black stools. Troponin slightly elevated, potassium 2.9  Hospital Course:  Patient was admitted to hospitalist service, telemetry. Started on rocephin and azithromycin.  Supplemental potassium ordered.  Cardiology consulted for elevated troponin.  Soon after admission, went into ventricular tachycardia then ventricular fibrillation and became unresponsive.  Code blue called.  Received  about 1 minute of CPR and shocked.  Regained consciousness.  Started on amiodarone gtt. Azithromycin stopped and doxycyline started for atypical coverage.   Echo showed depressed EF.  No overt CHF noted clinically.  Cath showed clean coronaries.  EP consulted and placed ICD.  See notes for details.  Allergic to Ace inhibitor.  Metoprolol changed to coreg.  Bidil started. Cardiology has cleared patient for discharge.  Cough much improved.  Procedures:  Cardiac catheterization by Dr. Kirke Corin which showed clean coronaries and EF 35%  Single chamber ICD by Dr. [name]  Consultations:  Cardiology  EP  Discharge Exam: Filed Vitals:   08/05/13 0510  BP: 158/96  Pulse: 94  Temp: 98.1 F (36.7 C)  Resp: 18    General: comfortable Cardiovascular: RRR without MGR Respiratory: CTA without WRR  Discharge Instructions  Discharge Orders   Future Appointments Provider Department Dept Phone   08/07/2013 12:15 PM Delmer Islam, RD [facility] [phone]   08/11/2013 8:50 AM Kennon Rounds [facility] [place] Office [phone]   08/17/2013 9:00 AM [facility] [place] [phone]   08/17/2013 2:00 PM [facility] [place] Office [phone]   11/14/2013 9:30 AM Marinus Maw, MD [facility] [phone]   Future Orders Complete By Expires   Diet - low sodium heart healthy  As directed    Discharge instructions  As directed    Comments:     No heavy lifting   Increase activity slowly  As directed        Medication List    STOP  taking these medications       metoprolol tartrate 25 MG tablet  Commonly known as:  LOPRESSOR     verapamil 240 MG CR tablet  Commonly known as:  CALAN-SR      TAKE these medications       acetaminophen 325 MG tablet  Commonly known as:  TYLENOL  Take 1-2 tablets (325-650 mg total) by mouth every 4 (four) hours as  needed for mild pain.     amitriptyline 25 MG tablet  Commonly known as:  ELAVIL  Take 75 mg by mouth at bedtime.     aspirin 325 MG tablet  Take 325 mg by mouth every morning.     atorvastatin 10 MG tablet  Commonly known as:  LIPITOR  Take 1 tablet (10 mg total) by mouth daily at 6 PM.     benzonatate 100 MG capsule  Commonly known as:  TESSALON  Take 1 capsule (100 mg total) by mouth 3 (three) times daily as needed for cough.     carvedilol 12.5 MG tablet  Commonly known as:  COREG  Take 1 tablet (12.5 mg total) by mouth 2 (two) times daily with a meal.     cefUROXime 500 MG tablet  Commonly known as:  CEFTIN  Take 1 tablet (500 mg total) by mouth 2 (two) times daily with a meal.     furosemide 40 MG tablet  Commonly known as:  LASIX  Take 40 mg by mouth every morning.     HYDROcodone-acetaminophen 5-325 MG per tablet  Commonly known as:  NORCO/VICODIN  Take 2 tablets by mouth every 6 (six) hours as needed for pain.     multivitamin tablet  Take 1 tablet by mouth daily.     pantoprazole 40 MG tablet  Commonly known as:  PROTONIX  Take 40 mg by mouth every morning.       Allergies  Allergen Reactions  . Ace Inhibitors Swelling  . Iron Other (See Comments)    Gives patient migraine headaches when supplemented  . Morphine And Related Itching and Rash       Follow-up Information   Follow up with [facility], MD In 4 weeks.   Specialty:  Family Medicine   Contact information:   [address] [place] [address] [phone]       Follow up with [name], MD. Schedule an appointment as soon as possible for a visit in 1 week.   Specialty:  Cardiology   Contact information:   [address] [place] [address] [phone]        The results of significant diagnostics from this hospitalization (including imaging, microbiology, ancillary and laboratory) are listed below for reference.    Significant  Diagnostic Studies: Dg Chest 2 View  08/05/2013   CLINICAL DATA:  ICD placement.  EXAM: CHEST  2 VIEW  COMPARISON:  CT 08/01/2013  FINDINGS: Two views of the chest were obtained. The patient now has a left chest single lead cardiac ICD. The lead appears to terminate in the right ventricle. Again noted are parenchymal densities along the periphery of the left chest and left lung base which are poorly characterized due to the ICD device. Right lung is clear. No evidence for a pneumothorax.  IMPRESSION: Placement of left cardiac ICD.  No evidence for a pneumothorax.  Limited evaluation of the parenchymal disease in the left chest due to the cardiac ICD.   Electronically Signed   By:  Richarda Overlie M.D.   On: 08/05/2013 08:37   Ct Angio Chest Pe W/cm &/or Wo Cm  08/01/2013   CLINICAL DATA:  Fever, weakness, and dizziness. Symptoms are worsening today. New left rib pain. No injury. Decreased lung sounds in the lower lungs. Syncope and hypoxia.  EXAM: CT ANGIOGRAPHY CHEST WITH CONTRAST  TECHNIQUE: Multidetector CT imaging of the chest was performed using the standard protocol during bolus administration of intravenous contrast. Multiplanar CT image reconstructions including MIPs were obtained to evaluate the vascular anatomy.  CONTRAST:  80mL OMNIPAQUE IOHEXOL 350 MG/ML SOLN  COMPARISON:  None.  FINDINGS: Technically adequate study with good opacification of the central and segmental pulmonary arteries. No focal filling defects are demonstrated. No evidence of significant pulmonary embolus. There is consolidation in the left lower lung and left lingula consistent with pneumonia. Followup after resolution of acute process is recommended to exclude an underlying obstructing lesion. Airways appear patent. Mild cardiac enlargement. Normal caliber thoracic aorta. No significant lymphadenopathy in the chest. No pleural effusions. Atelectasis in the right lung base. No pneumothorax.  Incidental images through the upper  abdomen demonstrate a low-attenuation lesion in the right lobe of the liver measuring 17 mm diameter. The margins are not distinct and a solid lesion is not excluded. Consider further elective evaluation with ultrasound or MRI. Gastric band in place.  Review of the MIP images confirms the above findings.  IMPRESSION: Consolidation in the left lung consistent with pneumonia. No evidence of significant pulmonary embolus.   Electronically Signed   By: Burman Nieves M.D.   On: 08/01/2013 03:07   Echo The cavity size was normal. Wall thickness was increased in a pattern of mild LVH. No definite regionality, looks like global hypokinesis. Systolic function was mildly to moderately reduced. The estimated ejection fraction was in the range of 40% to 45%. Doppler parameters are consistent with abnormal left ventricular relaxation (grade 1 diastolic dysfunction). - Aortic valve: There was no stenosis. - Mitral valve: Mildly calcified annulus. Normal thickness leaflets . No significant regurgitation. - Right ventricle: The cavity size was mildly dilated. Systolic function was mildly reduced. - Tricuspid valve: Peak RV-RA gradient:30mm Hg (S). - Pulmonary arteries: PA systolic pressure 31-35 mmHg. - Systemic veins: IVC measured 2.4 cm with < 50% respirophasic variation, suggesting RA pressure 11-15 mmHg. Impressions:  - Normal LV size with mild LV hypertrophy. EF 40-45% with no definite regionality, looks like global hypokinesis. Mildly dilated RV with mildly decreased systolic function. No significant valvular abnormalities.  EKG on admission Sinus rhythm Borderline repolarization abnormality  Microbiology: Recent Results (from the past 240 hour(s))  URINE CULTURE     Status: None   Collection Time    08/01/13  2:02 AM      Result Value Range Status   Specimen Description URINE, CLEAN CATCH   Final   Special Requests NONE   Final   Culture  Setup Time     Final   Value: 08/01/2013  02:50     Performed at [facility] Count     Final   Value: 80,000 COLONIES/ML     Performed at [facility]   Culture     Final   Value: Multiple bacterial morphotypes present, none predominant. Suggest appropriate recollection if clinically indicated.     Performed at [facility]   Report Status 08/02/2013 FINAL   Final  MRSA PCR SCREENING     Status: None   Collection Time  08/01/13  2:34 PM      Result Value Range Status   MRSA by PCR NEGATIVE  NEGATIVE Final   Comment:            The GeneXpert MRSA Assay (FDA     approved for NASAL specimens     only), is one component of a     comprehensive MRSA colonization     surveillance program. It is not     intended to diagnose MRSA     infection nor to guide or     monitor treatment for     MRSA infections.     Labs: Basic Metabolic Panel:  Recent Labs Lab 08/01/13 0830 08/01/13 1335 08/01/13 1947 08/02/13 0617 08/03/13 0440 08/04/13 1200 08/05/13 0544  NA 136  --  134* 135 136  --  141  K 2.9*  --  4.0 4.4 3.9  --  3.4*  CL 99  --  99 102 102  --  103  CO2 25  --  26 23 26   --  30  GLUCOSE 112*  --  150* 113* 109*  --  97  BUN 19  --  19 20 15   --  8  CREATININE 0.98  --  0.98 0.97 0.80 0.75 0.71  CALCIUM 8.6  --  8.5 8.6 8.5  --  8.9  MG  --  1.8  --   --   --   --   --    Liver Function Tests:  Recent Labs Lab 08/01/13 0830  AST 16  ALT 11  ALKPHOS 80  BILITOT 0.9  PROT 7.0  ALBUMIN 2.7*   No results found for this basename: LIPASE, AMYLASE,  in the last 168 hours No results found for this basename: AMMONIA,  in the last 168 hours CBC:  Recent Labs Lab 08/01/13 0149 08/01/13 0830 08/02/13 0617 08/03/13 0440 08/04/13 1200  WBC 16.8* 14.2* 12.0* 9.7 7.7  NEUTROABS 14.1* 12.0*  --   --   --   HGB 10.6* 10.4* 9.3* 9.3* 10.7*  HCT 32.8* 31.0* 29.8* 29.5* 33.7*  MCV 83.9 81.4 85.1 84.5 83.2  PLT 146* 156 153 172 220   Cardiac Enzymes:  Recent Labs Lab  08/01/13 0830 08/01/13 1355 08/01/13 1406 08/01/13 1947  TROPONINI 0.84* 0.57* 0.73* 0.49*   BNP: BNP (last 3 results)  Recent Labs  08/01/13 0830  PROBNP 2445.0*   CBG: No results found for this basename: GLUCAP,  in the last 168 hours     Signed:  Myrick Mcnairy L  [facility] 08/05/2013, 9:00 AM

## 2013-08-05 NOTE — Progress Notes (Signed)
Patient ID: Lori Beard, female   DOB: 01/17/1951, 62 y.o.   MRN: [id number] Subjective:  No complaints wants to go home  Objective:  Vital Signs in the last 24 hours: Temp:  [98.1 F (36.7 C)-98.9 F (37.2 C)] 98.1 F (36.7 C) (11/29 0510) Pulse Rate:  [69-94] 94 (11/29 0510) Resp:  [18] 18 (11/29 0510) BP: (158-189)/(80-99) 158/96 mmHg (11/29 0510) SpO2:  [88 %-97 %] 94 % (11/29 0510)  Intake/Output from previous day: 11/28 0701 - 11/29 0700 In: 610.1 [P.O.:480; I.V.:80.1; IV Piggyback:50] Out: 1050 [Urine:1050] Intake/Output from this shift:    Physical Exam: Well appearing middle aged woman, NAD HEENT: Unremarkable Neck:  No JVD, no thyromegally Back:  No CVA tenderness Lungs:  Clear with no wheezes HEART:  Regular rate rhythm, no murmurs, no rubs, no clicks Abd:  Flat, positive bowel sounds, no organomegally, no rebound, no guarding Ext:  2 plus pulses, no edema, no cyanosis, no clubbing Skin:  No rashes no nodules Neuro:  CN II through XII intact, motor grossly intact AICD under left clavicle with no hematoma  Lab Results:  Recent Labs  08/03/13 0440 08/04/13 1200  WBC 9.7 7.7  HGB 9.3* 10.7*  PLT 172 220    Recent Labs  08/03/13 0440 08/04/13 1200 08/05/13 0544  NA 136  --  141  K 3.9  --  3.4*  CL 102  --  103  CO2 26  --  30  GLUCOSE 109*  --  97  BUN 15  --  8  CREATININE 0.80 0.75 0.71    Imaging: No results found.  Cardiac Studies: Tele - nsr Assessment/Plan:  1. PMVT/VF arrest - s/p defibrillation, doing well 2. Non-ischemic CM, LV dysfunction with no obstructive CAD 3. HTN 4. Pneumonia  Reviewed device interogation with tech Normal function Wound looks good.  Ok to d/c home.  Antibiotics per primary service.  CXR with good wire position LLL scarring some curley B lines And aortic ectasia.     LOS: 4 days    [name],M.D. 08/05/2013, 7:50 AM

## 2013-08-06 DIAGNOSIS — I252 Old myocardial infarction: Secondary | ICD-10-CM | POA: Insufficient documentation

## 2013-08-06 DIAGNOSIS — I219 Acute myocardial infarction, unspecified: Secondary | ICD-10-CM

## 2013-08-06 HISTORY — DX: Acute myocardial infarction, unspecified: I21.9

## 2013-08-07 ENCOUNTER — Ambulatory Visit: Payer: 59 | Admitting: Dietician

## 2013-08-11 ENCOUNTER — Telehealth: Payer: Self-pay | Admitting: *Deleted

## 2013-08-11 ENCOUNTER — Other Ambulatory Visit (HOSPITAL_COMMUNITY): Payer: Self-pay | Admitting: Family Medicine

## 2013-08-11 ENCOUNTER — Encounter: Payer: Self-pay | Admitting: Physician Assistant

## 2013-08-11 ENCOUNTER — Ambulatory Visit (INDEPENDENT_AMBULATORY_CARE_PROVIDER_SITE_OTHER): Payer: 59 | Admitting: Physician Assistant

## 2013-08-11 VITALS — BP 170/99 | HR 78 | Ht 63.0 in | Wt 217.0 lb

## 2013-08-11 DIAGNOSIS — I5022 Chronic systolic (congestive) heart failure: Secondary | ICD-10-CM

## 2013-08-11 DIAGNOSIS — Z95810 Presence of automatic (implantable) cardiac defibrillator: Secondary | ICD-10-CM

## 2013-08-11 DIAGNOSIS — I509 Heart failure, unspecified: Secondary | ICD-10-CM

## 2013-08-11 DIAGNOSIS — I10 Essential (primary) hypertension: Secondary | ICD-10-CM

## 2013-08-11 DIAGNOSIS — K769 Liver disease, unspecified: Secondary | ICD-10-CM

## 2013-08-11 DIAGNOSIS — I472 Ventricular tachycardia, unspecified: Secondary | ICD-10-CM

## 2013-08-11 DIAGNOSIS — E876 Hypokalemia: Secondary | ICD-10-CM

## 2013-08-11 DIAGNOSIS — I428 Other cardiomyopathies: Secondary | ICD-10-CM

## 2013-08-11 DIAGNOSIS — K7689 Other specified diseases of liver: Secondary | ICD-10-CM

## 2013-08-11 LAB — BASIC METABOLIC PANEL
BUN: 16 mg/dL (ref 6–23)
CO2: 32 mEq/L (ref 19–32)
Calcium: 8.9 mg/dL (ref 8.4–10.5)
Creatinine, Ser: 1 mg/dL (ref 0.4–1.2)
GFR: 71.99 mL/min (ref >60.00)
Glucose, Bld: 91 mg/dL (ref 70–99)
Potassium: 3.5 mEq/L (ref 3.5–5.1)

## 2013-08-11 MED ORDER — ISOSORB DINITRATE-HYDRALAZINE 20-37.5 MG PO TABS: 1.0000 | ORAL_TABLET | Freq: Three times a day (TID) | ORAL | Status: DC

## 2013-08-11 MED ORDER — LOSARTAN POTASSIUM 50 MG PO TABS: 50.0000 mg | ORAL_TABLET | Freq: Every day | ORAL | Status: DC

## 2013-08-11 NOTE — Progress Notes (Signed)
[address] [place], [place]  [address] Phone: [phone] Fax:  [phone]  Date:  08/11/2013   ID:  Lori Beard, DOB 20-Oct-1950, MRN [id number]  PCP:  Lupe Carney, MD  Cardiologist:  Dr. Delane Ginger   Electrophysiologist:  Dr. [name]    History of Present Illness: Lori Beard is a 62 y.o. female with a hx of HTN, obesity s/p LAP-BAND surgery and sleep apnea. She was admitted 11/25-11/29 with pneumonia. She ruled in for a non-STEMI. Her hospitalization was complicated by monomorphic ventricular tachycardia that progressed to ventricular fibrillation. She received defibrillation with restoration of spontaneous circulation. Review of her telemetry demonstrated R on T PVC. This was in the setting of hypokalemia with a K of 2.9.  Echocardiogram (08/01/13): Mild LVH, global HK, EF 40-45%, Gr 1 DD, MAC, mild RVE, mildly reduced RVSF, PASP 31-35.  LHC (08/02/13):  Normal coronary arteries, EF 35%, LVEDP 17.  Patient was seen by electrophysiology. It was felt that she required ICD implantation for secondary prevention of malignant ventricular arrhythmias in the setting of nonischemic cardiomyopathy. She underwent St. [name] single-chamber defibrillator implantation by Dr. [name] 08/04/13.  She is overall doing well since discharge. She denies chest discomfort. She denies significant dyspnea. She sleeps on 3 pillows chronically. There have been no changes. She denies PND or edema. She denies syncope. She denies any ICD therapies. She denies fever. Her cough is improved. Of note, she resumed taking her verapamil and metoprolol in addition to all her other medications. She did this because her blood pressure was running too high at home.  Recent Labs: 08/01/2013: ALT 11; Pro B Natriuretic peptide (BNP) 2445.0*  08/02/2013: HDL 14*; LDL (calc) 73  08/04/2013: Hemoglobin 10.7*  08/05/2013: Creatinine 0.71; Potassium 3.4*   Wt Readings from Last 3 Encounters:  08/11/13  217 lb (98.431 kg)  08/04/13 225 lb 1.4 oz (102.1 kg)  08/04/13 225 lb 1.4 oz (102.1 kg)     Past Medical History  Diagnosis Date  . Hypertension   . Morbid obesity     a. s/p lapband surgery 11/2012.  [name] GERD (gastroesophageal reflux disease)   . Arthritis     ALL OVER  . Sleep apnea 02/15/2012    SLEEP STUDY IN EPIC - MILD OSA-CPAP NOT RECOMMENDED-HOME OXYGEN SUGGESTED BECAUSE OF OXYGEN DESATS ON ROOM AIR.  [name] Cardiac arrest 08/01/2013    Polymorphic VT  . NICM (nonischemic cardiomyopathy)     Echocardiogram (08/01/13): Mild LVH, global HK, EF 40-45%, Gr 1 DD, MAC, mild RVE, mildly reduced RVSF, PASP 31-35.  LHC (08/02/13):  Normal coronary arteries, EF 35%, LVEDP 17.  . Chronic systolic CHF (congestive heart failure)   . Ventricular tachycardia     a. R on T PVC during admission for pneumonia/NSTEMI => monomorphic VT=>VF=>defib; s/p ICD  . S/P implantation of automatic cardioverter/defibrillator (AICD)     07/2013 [name])    Current Outpatient Prescriptions  Medication Sig Dispense Refill  . acetaminophen (TYLENOL) 325 MG tablet Take 1-2 tablets (325-650 mg total) by mouth every 4 (four) hours as needed for mild pain.      [name] amitriptyline (ELAVIL) 25 MG tablet Take 75 mg by mouth at bedtime.      [name] aspirin 325 MG tablet Take 325 mg by mouth every morning.       [name] atorvastatin (LIPITOR) 10 MG tablet Take 1 tablet (10 mg total) by mouth daily at 6 PM.  30 tablet  0  .  benzonatate (TESSALON) 100 MG capsule Take 1 capsule (100 mg total) by mouth 3 (three) times daily as needed for cough.  20 capsule  0  . carvedilol (COREG) 12.5 MG tablet Take 1 tablet (12.5 mg total) by mouth 2 (two) times daily with a meal.  60 tablet  0  . cefUROXime (CEFTIN) 500 MG tablet Take 1 tablet (500 mg total) by mouth 2 (two) times daily with a meal.  10 tablet  0  . furosemide (LASIX) 40 MG tablet Take 40 mg by mouth every morning.       [name] HYDROcodone-acetaminophen (NORCO/VICODIN) 5-325 MG per tablet Take  2 tablets by mouth every 6 (six) hours as needed for pain.  20 tablet  0  . isosorbide-hydrALAZINE (BIDIL) 20-37.5 MG per tablet Take 1 tablet by mouth 2 (two) times daily.      . metoprolol tartrate (LOPRESSOR) 25 MG tablet Take 25 mg by mouth 2 (two) times daily.      . Multiple Vitamin (MULTIVITAMIN) tablet Take 1 tablet by mouth daily.      . pantoprazole (PROTONIX) 40 MG tablet Take 40 mg by mouth every morning.       . verapamil (COVERA HS) 240 MG (CO) 24 hr tablet Take 240 mg by mouth at bedtime.       No current facility-administered medications for this visit.    Allergies:   Ace inhibitors; Iron; and Morphine and related   Social History:  The patient  reports that she has never smoked. She has never used smokeless tobacco. She reports that she does not drink alcohol or use illicit drugs.   Family History:  The patient's family history includes Cancer in her father and sister; Heart disease in her father; Kidney disease in her mother.   ROS:  Please see the history of present illness.      All other systems reviewed and negative.   PHYSICAL EXAM: VS:  BP 170/99  Pulse 78  Ht 5\' 3"  (1.6 m)  Wt 217 lb (98.431 kg)  BMI 38.45 kg/m2 Well nourished, well developed, in no acute distress HEENT: normal Neck: no JVD Cardiac:  normal S1, S2; RRR; no murmur Chest: ICD site well healed with Steri-Strips still in place Lungs:  clear to auscultation bilaterally, no wheezing, rhonchi or rales Abd: soft, nontender, no hepatomegaly Ext: no edema; right wrist without hematoma or mass Skin: warm and dry Neuro:  CNs 2-12 intact, no focal abnormalities noted  EKG:  NSR, HR 78, nonspecific ST-T wave changes     ASSESSMENT AND PLAN:  1. Chronic Systolic CHF:  Volume appears stable without diuretic therapy. She is NYHA class II. 2. Non-Ischemic CM:  She resumed taking verapamil and metoprolol in addition to carvedilol and BiDil because of high blood pressures at home. She tells me that the  reaction she had to ACE inhibitor was a cough only (not swelling). She denies any history of angioedema. I will place her on losartan 50 mg daily. I will increase her BiDil to 3 times a day. I have asked her to stop the verapamil metoprolol. She will return for a blood pressure check next week. I have asked her to continue to monitor blood pressures at home. Continue current dose of carvedilol for now. 3. Ventricular Tachycardia:  Continue beta blocker. She is status post ICD. 4. S/p AICD:  Follow up with EP as planned. 5. Hypertension:  Uncontrolled. He does medications as noted. Check a basic metabolic panel today and repeat  in one week. 6. Hypokalemia:  Repeat basic metabolic panel today. 7. Pneumonia:  Resolved. Follow up with PCP 8. Liver Lesion:  This was noted on chest CT during her hospitalization. I have asked her to follow with her PCP for further investigation. 9. Disposition:  Follow up with me in 2 weeks.  Signed, Tereso Newcomer, PA-C  08/11/2013 9:03 AM

## 2013-08-11 NOTE — Telephone Encounter (Signed)
Pt had appt today w/Scott W. PA and had asked if she could drive. I advised pt per Bing Neighbors. PA; NO driving until f/u w/Dr. [name]; due to received defibrillation with restoration and had an ICD implantation for secondary prevention . pt said ok

## 2013-08-11 NOTE — Patient Instructions (Signed)
STOP VERAPAMIL 08/12/13 STOP METOPROLOL 08/12/13  START LOSARTAN 50 MG DAILY; RX SENT IN TODAY  MAKE SURE TO TAKE COREG TWICE DAILY  LAB TODAY; BMET  REPEAT BMET 12/11 WHEN YOU COME IN FOR FOR DEVICE CHECK; MAKE SURE TO HAVE THEM CHECK YOUR BLOOD PRESSURE PER SCOTT WEAVER, PAC AND LET HIM KNOW WHAT THE READING IS BEFORE YOU LEAVE THE OFFICE ON 08/17/13  PER SCOTT WEAVER, PAC MAKE SURE TO TALK TO DR. DEAN ABOUT A SPOT ON YOUR LIVER FROM THE CT SCAN DONE IN THE HOSPITAL  CALL us IF YOUR BLOOD PRESSURE GO 140/90 OR HIGHER [phone].  PLEASE FOLLOW UP WITH [place], [facility] 09/04/13 @ 10:10

## 2013-08-15 ENCOUNTER — Ambulatory Visit (HOSPITAL_COMMUNITY): Payer: 59

## 2013-08-16 ENCOUNTER — Ambulatory Visit (HOSPITAL_COMMUNITY)
Admission: RE | Admit: 2013-08-16 | Discharge: 2013-08-16 | Disposition: A | Discharge status: Home / Self Care | Payer: 59 | Source: Ambulatory Visit | Attending: Family Medicine | Admitting: Family Medicine

## 2013-08-16 DIAGNOSIS — K7689 Other specified diseases of liver: Secondary | ICD-10-CM | POA: Insufficient documentation

## 2013-08-16 DIAGNOSIS — K769 Liver disease, unspecified: Secondary | ICD-10-CM

## 2013-08-17 ENCOUNTER — Ambulatory Visit (INDEPENDENT_AMBULATORY_CARE_PROVIDER_SITE_OTHER): Payer: Commercial Managed Care - PPO | Admitting: Physician Assistant

## 2013-08-17 ENCOUNTER — Ambulatory Visit (INDEPENDENT_AMBULATORY_CARE_PROVIDER_SITE_OTHER): Payer: 59 | Admitting: *Deleted

## 2013-08-17 ENCOUNTER — Encounter (INDEPENDENT_AMBULATORY_CARE_PROVIDER_SITE_OTHER): Payer: Self-pay

## 2013-08-17 VITALS — BP 162/112 | HR 92 | Temp 97.8°F | Resp 16 | Ht 64.0 in | Wt 212.8 lb

## 2013-08-17 DIAGNOSIS — Z9884 Bariatric surgery status: Secondary | ICD-10-CM

## 2013-08-17 DIAGNOSIS — I509 Heart failure, unspecified: Secondary | ICD-10-CM

## 2013-08-17 DIAGNOSIS — I472 Ventricular tachycardia: Secondary | ICD-10-CM

## 2013-08-17 DIAGNOSIS — I5022 Chronic systolic (congestive) heart failure: Secondary | ICD-10-CM

## 2013-08-17 DIAGNOSIS — I428 Other cardiomyopathies: Secondary | ICD-10-CM

## 2013-08-17 LAB — MDC_IDC_ENUM_SESS_TYPE_INCLINIC
Brady Statistic RV Percent Paced: 0 %
Lead Channel Impedance Value: 512.5 Ohm
Lead Channel Pacing Threshold Amplitude: 0.5 V
Lead Channel Pacing Threshold Amplitude: 0.5 V
Lead Channel Pacing Threshold Pulse Width: 0.5 ms
Lead Channel Pacing Threshold Pulse Width: 0.5 ms
Lead Channel Sensing Intrinsic Amplitude: 12 mV
Lead Channel Setting Pacing Amplitude: 3.5 V
Lead Channel Setting Pacing Pulse Width: 0.5 ms
Lead Channel Setting Sensing Sensitivity: 0.5 mV
Zone Setting Detection Interval: 250 ms

## 2013-08-17 NOTE — Progress Notes (Signed)
  HISTORY: Lori Beard is a 62 y.o.female who received an AP-Standard lap-band in March 2014 by Dr. Daphine Deutscher. She comes in with 2 lbs weight loss since her last visit in October. She was hospitalized for pneumonia, v. tach converting to v. fib which resulted in AICD placement. She comes in today with elevated BP but she has not taken her meds this am (she usually takes them with breakfast but she had not eaten yet). She denies chest pain or shortness of breath. [name] had previous elevated BP measurements during visits. As for her eating, she says her portion sizes are small and are satisfying. She has no regurgitation or reflux symptoms. She denies persistent or significant hunger.  VITAL SIGNS: Filed Vitals:   08/17/13 0852  BP: 162/112  Pulse: 92  Temp: 97.8 F (36.6 C)  Resp: 16    PHYSICAL EXAM: Physical exam reveals a very well-appearing 62 y.o.female in no apparent distress Neurologic: Awake, alert, oriented Psych: Bright affect, conversant Respiratory: Breathing even and unlabored. No stridor or wheezing Extremities: Atraumatic, good range of motion. Skin: Warm, Dry, no rashes Musculoskeletal: Normal gait, Joints normal  ASSESMENT: 63 y.o.  female  s/p AP-Standard lap-band.   PLAN: As she's in the green zone, we deferred a fill today. I encouraged her to stay active within the limits (if any) placed on her by cardiology. I also asked her to return in a month but if she has increased hunger/weight/portion sizes or dysphagia, to return earlier. She voiced understanding and agreement.

## 2013-08-17 NOTE — Patient Instructions (Signed)
Return in one month. Focus on good food choices as well as physical activity. Return sooner if you have an increase in hunger, portion sizes or weight. Return also for difficulty swallowing, night cough, reflux.   

## 2013-08-18 ENCOUNTER — Telehealth: Payer: Self-pay | Admitting: Internal Medicine

## 2013-08-18 NOTE — Telephone Encounter (Signed)
New message     Have defib and remote box but cannot transmit because of the kind of phone service she has.  Pls call her and let her know what else she can do.

## 2013-08-21 NOTE — Telephone Encounter (Signed)
LMOM for return call//kwm  

## 2013-08-21 NOTE — Progress Notes (Signed)
Wound check appointment. Steri-strips removed. Wound without redness or edema. Incision edges approximated, wound well healed. Normal device function. Threshold, sensing, and impedances consistent with implant measurements. Device programmed at 3.5V for extra safety margin until 3 month visit. Histogram distribution appropriate for patient and level of activity. No ventricular arrhythmias noted. Patient educated about wound care, arm mobility, lifting restrictions, shock plan. ROV in 3 months with GT. 

## 2013-08-22 ENCOUNTER — Telehealth: Payer: Self-pay | Admitting: Internal Medicine

## 2013-08-22 NOTE — Telephone Encounter (Signed)
Spoke w/pt and pt to be mailed cell adapter/kwm

## 2013-08-22 NOTE — Telephone Encounter (Signed)
Rerouting to Device

## 2013-08-22 NOTE — Telephone Encounter (Signed)
Pt to be mailed cell adapter/kwm

## 2013-08-22 NOTE — Telephone Encounter (Signed)
New Problem:  Pt states her remote device does not work b/c she has a Engineer, mining. Pt would like a call back to discuss her options.

## 2013-09-01 ENCOUNTER — Telehealth: Payer: Self-pay | Admitting: Physician Assistant

## 2013-09-01 ENCOUNTER — Other Ambulatory Visit: Payer: Self-pay | Admitting: *Deleted

## 2013-09-01 MED ORDER — ATORVASTATIN CALCIUM 10 MG PO TABS: 10.0000 mg | ORAL_TABLET | Freq: Every day | ORAL | Status: DC

## 2013-09-01 NOTE — Telephone Encounter (Signed)
REFILLED ATORVASTATIN, SHE HAS APPT ON 12/29.

## 2013-09-01 NOTE — Telephone Encounter (Signed)
New problem,    Pt  Need med called in to refill her Lipitor at [facility] [address]

## 2013-09-04 ENCOUNTER — Telehealth: Payer: Self-pay

## 2013-09-04 ENCOUNTER — Encounter: Payer: Self-pay | Admitting: Internal Medicine

## 2013-09-04 ENCOUNTER — Encounter: Payer: Self-pay | Admitting: Physician Assistant

## 2013-09-04 ENCOUNTER — Ambulatory Visit (INDEPENDENT_AMBULATORY_CARE_PROVIDER_SITE_OTHER): Payer: 59 | Admitting: *Deleted

## 2013-09-04 ENCOUNTER — Ambulatory Visit (INDEPENDENT_AMBULATORY_CARE_PROVIDER_SITE_OTHER): Payer: 59 | Admitting: Physician Assistant

## 2013-09-04 VITALS — BP 133/77 | HR 85 | Ht 63.5 in | Wt 217.0 lb

## 2013-09-04 DIAGNOSIS — I509 Heart failure, unspecified: Secondary | ICD-10-CM

## 2013-09-04 DIAGNOSIS — I10 Essential (primary) hypertension: Secondary | ICD-10-CM

## 2013-09-04 DIAGNOSIS — E876 Hypokalemia: Secondary | ICD-10-CM

## 2013-09-04 DIAGNOSIS — I428 Other cardiomyopathies: Secondary | ICD-10-CM

## 2013-09-04 DIAGNOSIS — Z95810 Presence of automatic (implantable) cardiac defibrillator: Secondary | ICD-10-CM

## 2013-09-04 DIAGNOSIS — I5022 Chronic systolic (congestive) heart failure: Secondary | ICD-10-CM

## 2013-09-04 LAB — BASIC METABOLIC PANEL
BUN: 12 mg/dL (ref 6–23)
CO2: 31 mEq/L (ref 19–32)
Chloride: 104 mEq/L (ref 96–112)
Creatinine, Ser: 0.9 mg/dL (ref 0.4–1.2)
GFR: 77.31 mL/min (ref >60.00)
Glucose, Bld: 117 mg/dL — ABNORMAL HIGH (ref 70–99)
Potassium: 3.1 mEq/L — ABNORMAL LOW (ref 3.5–5.1)
Sodium: 143 mEq/L (ref 135–145)

## 2013-09-04 MED ORDER — ATORVASTATIN CALCIUM 10 MG PO TABS: 10.0000 mg | ORAL_TABLET | Freq: Every day | ORAL | Status: DC

## 2013-09-04 MED ORDER — POTASSIUM CHLORIDE CRYS ER 20 MEQ PO TBCR: 40.0000 meq | EXTENDED_RELEASE_TABLET | Freq: Every day | ORAL | Status: DC

## 2013-09-04 MED ORDER — CARVEDILOL 12.5 MG PO TABS: 18.7500 mg | ORAL_TABLET | Freq: Two times a day (BID) | ORAL | Status: DC

## 2013-09-04 MED ORDER — LOSARTAN POTASSIUM 50 MG PO TABS: 50.0000 mg | ORAL_TABLET | Freq: Every day | ORAL | Status: DC

## 2013-09-04 NOTE — Progress Notes (Signed)
[address] [place], [place]  [address] Phone: [phone] Fax:  [phone]  Date:  09/04/2013   ID:  Lori Beard, DOB 1951-02-27, MRN [id number]  PCP:  Lupe Carney, MD  Cardiologist:  Dr. Delane Ginger   Electrophysiologist:  Dr. [name]    History of Present Illness: Lori Beard is a 62 y.o. female with a hx of HTN, obesity s/p LAP-BAND surgery and sleep apnea. She was admitted 07/2013 with pneumonia. She ruled in for a NSTEMI. Her hospitalization was complicated by monomorphic VTach that progressed to VFib due to R on T PVC in the setting of hypokalemia with a K of 2.9.  Echocardiogram (08/01/13): Mild LVH, global HK, EF 40-45%, Gr 1 DD, MAC, mild RVE, mildly reduced RVSF, PASP 31-35.  LHC (08/02/13):  Normal coronary arteries, EF 35%, LVEDP 17.  She was seen by EP and underwent ICD implantation for secondary prevention of malignant ventricular arrhythmias in the setting of nonischemic cardiomyopathy. She underwent St. [name] single-chamber defibrillator implantation by Dr. [name] 08/04/13.  I saw her 08/11/13.  She had resumed verapamil and metoprolol due to high blood pressures. I stopped these medications and placed on losartan and increased her BiDil to 3x per day.    Doing well since last seen.  The patient denies chest pain, shortness of breath, syncope, orthopnea, PND or significant pedal edema.  No ICD shocks.   Recent Labs: 08/01/2013: ALT 11; Pro B Natriuretic peptide (BNP) 2445.0*  08/02/2013: HDL 14*; LDL (calc) 73  08/04/2013: Hemoglobin 10.7*  08/11/2013: Creatinine 1.0; Potassium 3.5   Wt Readings from Last 3 Encounters:  09/04/13 217 lb (98.431 kg)  08/17/13 212 lb 12.8 oz (96.525 kg)  08/11/13 217 lb (98.431 kg)     Past Medical History  Diagnosis Date  . Hypertension   . Morbid obesity     a. s/p lapband surgery 11/2012.  [name] GERD (gastroesophageal reflux disease)   . Arthritis     ALL OVER  . Sleep apnea 02/15/2012    SLEEP STUDY  IN EPIC - MILD OSA-CPAP NOT RECOMMENDED-HOME OXYGEN SUGGESTED BECAUSE OF OXYGEN DESATS ON ROOM AIR.  [name] Cardiac arrest 08/01/2013    Polymorphic VT  . NICM (nonischemic cardiomyopathy)     Echocardiogram (08/01/13): Mild LVH, global HK, EF 40-45%, Gr 1 DD, MAC, mild RVE, mildly reduced RVSF, PASP 31-35.  LHC (08/02/13):  Normal coronary arteries, EF 35%, LVEDP 17.  . Chronic systolic CHF (congestive heart failure)   . Ventricular tachycardia     a. R on T PVC during admission for pneumonia/NSTEMI => monomorphic VT=>VF=>defib; s/p ICD  . S/P implantation of automatic cardioverter/defibrillator (AICD)     07/2013 [name])  . Myocardial infarction 08/06/13    Current Outpatient Prescriptions  Medication Sig Dispense Refill  . acetaminophen (TYLENOL) 325 MG tablet Take 1-2 tablets (325-650 mg total) by mouth every 4 (four) hours as needed for mild pain.      [name] amitriptyline (ELAVIL) 25 MG tablet Take 75 mg by mouth at bedtime.      [name] aspirin 325 MG tablet Take 325 mg by mouth every morning.       [name] atorvastatin (LIPITOR) 10 MG tablet Take 1 tablet (10 mg total) by mouth daily at 6 PM.  30 tablet  0  . benzonatate (TESSALON) 100 MG capsule Take 1 capsule (100 mg total) by mouth 3 (three) times daily as needed for cough.  20 capsule  0  . carvedilol (COREG)  12.5 MG tablet Take 1 tablet (12.5 mg total) by mouth 2 (two) times daily with a meal.  60 tablet  0  . cefUROXime (CEFTIN) 500 MG tablet Take 1 tablet (500 mg total) by mouth 2 (two) times daily with a meal.  10 tablet  0  . furosemide (LASIX) 40 MG tablet Take 40 mg by mouth every morning.       [name] HYDROcodone-acetaminophen (NORCO/VICODIN) 5-325 MG per tablet Take 2 tablets by mouth every 6 (six) hours as needed for pain.  20 tablet  0  . isosorbide-hydrALAZINE (BIDIL) 20-37.5 MG per tablet Take 1 tablet by mouth 3 (three) times daily.  90 tablet  3  . losartan (COZAAR) 50 MG tablet Take 1 tablet (50 mg total) by mouth daily.  90 tablet  3  .  Multiple Vitamin (MULTIVITAMIN) tablet Take 1 tablet by mouth daily.      . pantoprazole (PROTONIX) 40 MG tablet Take 40 mg by mouth every morning.        No current facility-administered medications for this visit.    Allergies:   Ace inhibitors; Iron; and Morphine and related   Social History:  The patient  reports that she has never smoked. She has never used smokeless tobacco. She reports that she does not drink alcohol or use illicit drugs.   Family History:  The patient's family history includes Cancer in her father and sister; Heart disease in her father; Kidney disease in her mother.   ROS:  Please see the history of present illness.    All other systems reviewed and negative.   PHYSICAL EXAM: VS:  BP 133/77  Pulse 85  Ht 5' 3.5" (1.613 m)  Wt 217 lb (98.431 kg)  BMI 37.83 kg/m2 Well nourished, well developed, in no acute distress HEENT: normal Neck: no JVD Cardiac:  normal S1, S2; RRR; no murmur Lungs:  clear to auscultation bilaterally, no wheezing, rhonchi or rales Abd: soft, nontender, no hepatomegaly Ext: no edema  Skin: warm and dry Neuro:  CNs 2-12 intact, no focal abnormalities noted  EKG:   NSR, HR 85, LAD, NSSTTW changes, IVCD, no change from prior tracing    ASSESSMENT AND PLAN:  1. Chronic Systolic CHF:  Volume stable.  Continue current Rx.  2. Non-Ischemic CM:  Continue current dose of BiDil, Losartan.  Check f/u BMET today.  Increase Coreg to 18.75 mg BID.   3. Ventricular Tachycardia:  Check f/u BMET today to ensure K+ level ok.   4. S/p AICD:  She has noted a buzzing sound when her cell phone gets close to her device.  Her device will be interrogated today. Follow up with EP as planned. 5. Hypertension:  Better controlled.  6. Disposition:  Follow up with Dr. Delane Ginger in 1 month and Dr. [name] as planned.   Signed, Tereso Newcomer, PA-C  09/04/2013 10:11 AM

## 2013-09-04 NOTE — Patient Instructions (Signed)
Your physician recommends that you schedule a follow-up appointment in: 1 MONTHS WITH DR. Elease Hashimoto  Your physician has recommended you make the following change in your medication:   INCREASE YOUR COREG 18.75 MG TWO TIMES A DAY ( TAKE 1.5 TABLET OF YOUR 12.5 MG TWICE A DAY)  Your physician recommends that you return for lab work in: TODAY Designer[employer])  Your physician recommends that you continue on your current medications as directed. Please refer to the Current Medication list given to you today.

## 2013-09-04 NOTE — Progress Notes (Signed)
Pt seeing Tereso Newcomer, PA-C. Pt states her device intermittently vibrates when using her cell phone. Pt's device is on her left side; she typically uses her left ear to talk on her cell phone.   I demonstrated the alert notifier, pt states it's a similar sensation. No alerts recorded by device. We attempted to recreate the sensation in clinic but could not do so.   Pt agrees to permanently use her cell phone only with her right ear.  ROV w/ Dr. [name] 11/14/13.

## 2013-09-04 NOTE — Telephone Encounter (Signed)
Notes Recorded by Beatrice Lecher, PA-C on 09/04/2013 at 5:02 PM Start K+ 40 mEq daily. Start tonight.  Repeat BMET in 1 week. Tereso Newcomer, PA-C  09/04/2013 5:02 PM   I spoke with the pt and made her aware of lab results.  The pt's pharmacy is currently closed and she will pick up Rx first thing in the morning. I made the pt aware that Rx will be potassium chloride take two tablets by mouth daily.  BMP will be rechecked on 09/12/2013.

## 2013-09-12 ENCOUNTER — Other Ambulatory Visit (INDEPENDENT_AMBULATORY_CARE_PROVIDER_SITE_OTHER): Payer: 59

## 2013-09-12 DIAGNOSIS — E876 Hypokalemia: Secondary | ICD-10-CM | POA: Diagnosis not present

## 2013-09-12 LAB — BASIC METABOLIC PANEL
BUN: 13 mg/dL (ref 6–23)
CO2: 33 mEq/L — ABNORMAL HIGH (ref 19–32)
Calcium: 9.4 mg/dL (ref 8.4–10.5)
Chloride: 101 mEq/L (ref 96–112)
Creatinine, Ser: 1.1 mg/dL (ref 0.4–1.2)
GFR: 65.16 mL/min (ref >60.00)
Glucose, Bld: 92 mg/dL (ref 70–99)
Potassium: 3.9 mEq/L (ref 3.5–5.1)
SODIUM: 140 meq/L (ref 135–145)

## 2013-09-21 ENCOUNTER — Encounter (INDEPENDENT_AMBULATORY_CARE_PROVIDER_SITE_OTHER): Payer: Self-pay

## 2013-09-21 ENCOUNTER — Ambulatory Visit (INDEPENDENT_AMBULATORY_CARE_PROVIDER_SITE_OTHER): Payer: Commercial Managed Care - PPO | Admitting: Physician Assistant

## 2013-09-21 ENCOUNTER — Telehealth: Payer: Self-pay | Admitting: Internal Medicine

## 2013-09-21 VITALS — BP 120/90 | HR 72 | Temp 98.2°F | Resp 14 | Ht 64.0 in | Wt 214.4 lb

## 2013-09-21 DIAGNOSIS — Z9884 Bariatric surgery status: Secondary | ICD-10-CM | POA: Diagnosis not present

## 2013-09-21 NOTE — Patient Instructions (Signed)
Return in one month. Focus on good food choices as well as physical activity. Return sooner if you have an increase in hunger, portion sizes or weight. Return also for difficulty swallowing, night cough, reflux.   

## 2013-09-21 NOTE — Telephone Encounter (Signed)
New message  patient would like to start working out, she wants to know what she can and cant do. Patient just had lap band done a few weeks ago.

## 2013-09-21 NOTE — Progress Notes (Signed)
  HISTORY: SHACONNA GRACIAN is a 63 y.o.female who received an AP-Standard lap-band in March 2014 by Dr. Daphine Deutscher. She comes in with stable weight since her last visit in December. No fill was done at that time. She has lost 43 lbs since surgery. She reports no change in hunger since her last visit, and actually eating less than previously. She reports eating very little during the course of the day mostly due to lack of hunger. She denies regurgitation or reflux symptoms. She is not sure of how much she should be exercising since her heart issue recently so she hasn't been walking much more than around the house.  VITAL SIGNS: Filed Vitals:   09/21/13 1054  BP: 120/90  Pulse: 72  Temp: 98.2 F (36.8 C)  Resp: 14    PHYSICAL EXAM: Physical exam reveals a very well-appearing 63 y.o.female in no apparent distress Neurologic: Awake, alert, oriented Psych: Bright affect, conversant Respiratory: Breathing even and unlabored. No stridor or wheezing Extremities: Atraumatic, good range of motion. Skin: Warm, Dry, no rashes Musculoskeletal: Normal gait, Joints normal  ASSESMENT: 63 y.o.  female  s/p AP-Standard lap-band.   PLAN: We opted to not do a fill today as it appears she's eating very little. I asked her to consult her cardiologist regarding exercise limitations. We'll have her back in one month or sooner if necessary.

## 2013-09-21 NOTE — Telephone Encounter (Signed)
spoke with patient and let her know I would discuss with Dr [name] on [place]

## 2013-09-26 NOTE — Telephone Encounter (Signed)
Discussed with Dr [name], ok to walk and work her way up to an hour per day

## 2013-10-06 ENCOUNTER — Encounter: Payer: Self-pay | Admitting: Cardiovascular Disease

## 2013-10-06 ENCOUNTER — Ambulatory Visit (INDEPENDENT_AMBULATORY_CARE_PROVIDER_SITE_OTHER): Payer: 59 | Admitting: Cardiovascular Disease

## 2013-10-06 ENCOUNTER — Encounter (INDEPENDENT_AMBULATORY_CARE_PROVIDER_SITE_OTHER): Payer: Self-pay

## 2013-10-06 VITALS — BP 110/80 | HR 80 | Ht 64.0 in | Wt 217.6 lb

## 2013-10-06 DIAGNOSIS — I509 Heart failure, unspecified: Secondary | ICD-10-CM | POA: Diagnosis not present

## 2013-10-06 DIAGNOSIS — I5022 Chronic systolic (congestive) heart failure: Secondary | ICD-10-CM

## 2013-10-06 MED ORDER — ASPIRIN 81 MG PO TABS: 81.0000 mg | ORAL_TABLET | Freq: Every evening | ORAL | Status: AC

## 2013-10-06 MED ORDER — SPIRONOLACTONE 25 MG PO TABS: 25.0000 mg | ORAL_TABLET | Freq: Every day | ORAL | Status: DC

## 2013-10-06 NOTE — Assessment & Plan Note (Signed)
Lori Beard is doing very well. [name] on carvedilol and losartan. We will add spironolactone 25 mg a day. We will have our pharmacist resident monitor her potassium levels.  Her blood pressure has been normal.   I will see her back in the office in 3 months for followup visit and basic metabolic profile.  Has a history of ventricular tachycardia presumably due to  hypokalemia. Will make sure that he keep a close eye on her potassium.  Dr. [name] will continue to follow her for her ICD.

## 2013-10-06 NOTE — Progress Notes (Signed)
[address] [place], [place]  [address] Phone: [phone]) [phone] Fax:  [phone]  Date:  10/06/2013   ID:  Lori Beard, DOB [birth year], MRN [id number]  PCP:  Lupe CarneyMITCHELL,DEAN, MD  Cardiologist:  Dr. Delane GingerPhil Keoni Havey   Electrophysiologist:  Dr. Lewayne BuntingGregg Taylor    History of Present Illness: Notes from [place] - 09/04/14:  Lori Beard is a 63 y.o. female with a hx of HTN, obesity s/p LAP-BAND surgery and sleep apnea. She was admitted 07/2013 with pneumonia. She ruled in for a NSTEMI. Her hospitalization was complicated by monomorphic VTach that progressed to VFib due to R on T PVC in the setting of hypokalemia with a K of 2.9.  Echocardiogram (08/01/13): Mild LVH, global HK, EF 40-45%, Gr 1 DD, MAC, mild RVE, mildly reduced RVSF, PASP 31-35.  LHC (08/02/13):  Normal coronary arteries, EF 35%, LVEDP 17.  She was seen by EP and underwent ICD implantation for secondary prevention of malignant ventricular arrhythmias in the setting of nonischemic cardiomyopathy. She underwent St. [name] single-chamber defibrillator implantation by Dr. Ladona Ridgelaylor 08/04/13.  I saw her 08/11/13.  She had resumed verapamil and metoprolol due to high blood pressures. I stopped these medications and placed on losartan and increased her BiDil to 3x per day.    Doing well since last seen.  The patient denies chest pain, shortness of breath, syncope, orthopnea, PND or significant pedal edema.  No ICD shocks.   Jan. 30, 2015:  Lori Beard presents today for followup evaluation of her hypertension and chronic systolic congestive heart failure. Her left ventricular ejection fraction is approximate 35%. She has a history of a cardiac arrest and has had an ICD placed. She sees Dr. Lewayne BuntingGregg Taylor for management of that.    Recent Labs: 08/01/2013: ALT 11; Pro B Natriuretic peptide (BNP) 2445.0*  08/02/2013: HDL Cholesterol 14*; LDL (calc) 73  08/04/2013: Hemoglobin 10.7*  09/12/2013: Creatinine 1.1; Potassium  3.9   Wt Readings from Last 3 Encounters:  10/06/13 217 lb 9.6 oz (98.703 kg)  09/21/13 214 lb 6.4 oz (97.251 kg)  09/04/13 217 lb (98.431 kg)     Past Medical History  Diagnosis Date  . Hypertension   . Morbid obesity     a. s/p lapband surgery 11/2012.  [name]. GERD (gastroesophageal reflux disease)   . Arthritis     ALL OVER  . Sleep apnea 02/15/2012    SLEEP STUDY IN EPIC - MILD OSA-CPAP NOT RECOMMENDED-HOME OXYGEN SUGGESTED BECAUSE OF OXYGEN DESATS ON ROOM AIR.  [name]. Cardiac arrest 08/01/2013    Polymorphic VT  . NICM (nonischemic cardiomyopathy)     Echocardiogram (08/01/13): Mild LVH, global HK, EF 40-45%, Gr 1 DD, MAC, mild RVE, mildly reduced RVSF, PASP 31-35.  LHC (08/02/13):  Normal coronary arteries, EF 35%, LVEDP 17.  . Chronic systolic CHF (congestive heart failure)   . Ventricular tachycardia     a. R on T PVC during admission for pneumonia/NSTEMI => monomorphic VT=>VF=>defib; s/p ICD  . S/P implantation of automatic cardioverter/defibrillator (AICD)     07/2013 [name](Taylor)  . Myocardial infarction 08/06/13    Current Outpatient Prescriptions  Medication Sig Dispense Refill  . acetaminophen (TYLENOL) 325 MG tablet Take 1-2 tablets (325-650 mg total) by mouth every 4 (four) hours as needed for mild pain.      [name]. amitriptyline (ELAVIL) 25 MG tablet Take 75 mg by mouth at bedtime.      [name]. aspirin 325 MG tablet Take 325 mg by  mouth every morning.       [name] atorvastatin (LIPITOR) 10 MG tablet Take 1 tablet (10 mg total) by mouth daily at 6 PM.  30 tablet  3  . carvedilol (COREG) 12.5 MG tablet Take 1.5 tablets (18.75 mg total) by mouth 2 (two) times daily with a meal.  60 tablet  3  . cefUROXime (CEFTIN) 500 MG tablet Take 1 tablet (500 mg total) by mouth 2 (two) times daily with a meal.  10 tablet  0  . furosemide (LASIX) 40 MG tablet Take 40 mg by mouth every morning.       [name] HYDROcodone-acetaminophen (NORCO/VICODIN) 5-325 MG per tablet Take 2 tablets by mouth every 6 (six) hours as  needed for pain.  20 tablet  0  . isosorbide-hydrALAZINE (BIDIL) 20-37.5 MG per tablet Take 1 tablet by mouth 3 (three) times daily.  90 tablet  3  . losartan (COZAAR) 50 MG tablet Take 1 tablet (50 mg total) by mouth daily.  90 tablet  3  . Multiple Vitamin (MULTIVITAMIN) tablet Take 1 tablet by mouth daily.      . pantoprazole (PROTONIX) 40 MG tablet Take 40 mg by mouth every morning.       . potassium chloride SA (K-DUR,KLOR-CON) 20 MEQ tablet Take 2 tablets (40 mEq total) by mouth daily.  180 tablet  3   No current facility-administered medications for this visit.    Allergies:   Ace inhibitors; Iron; and Morphine and related   Social History:  The patient  reports that she has never smoked. She has never used smokeless tobacco. She reports that she does not drink alcohol or use illicit drugs.   Family History:  The patient's family history includes Cancer in her father and sister; Heart disease in her father; Kidney disease in her mother.   ROS:  Please see the history of present illness.    All other systems reviewed and negative.   PHYSICAL EXAM: VS:  BP 110/80  Pulse 80  Ht 5\' 4"  (1.626 m)  Wt 217 lb 9.6 oz (98.703 kg)  BMI 37.33 kg/m2 Well nourished, well developed, in no acute distress HEENT: normal Neck: no JVD Cardiac:  normal S1, S2; RRR; no murmur Lungs:  clear to auscultation bilaterally, no wheezing, rhonchi or rales Abd: soft, nontender, no hepatomegaly Ext: no edema  Skin: warm and dry Neuro:  CNs 2-12 intact, no focal abnormalities noted  EKG:   NSR, HR 85, LAD, NSSTTW changes, IVCD, no change from prior tracing    ASSESSMENT AND PLAN:

## 2013-10-06 NOTE — Patient Instructions (Addendum)
Your physician has recommended you make the following change in your medication:  START SPIRONOLACTONE 25 MG DAILY REDUCE ASPIRIN TO 81 MG DAILY  EXPECT A CALL FROM OUR STAFF TO DISCUSS SPIRONOLACTONE AND SET UP LAB DATES.  Your physician recommends that you schedule a follow-up appointment in: 3 MONTHS  Your physician recommends that you return for lab work in: 3 MONTHS BMET

## 2013-10-10 ENCOUNTER — Other Ambulatory Visit (INDEPENDENT_AMBULATORY_CARE_PROVIDER_SITE_OTHER): Payer: 59

## 2013-10-10 ENCOUNTER — Telehealth: Payer: Self-pay | Admitting: Pharmacist

## 2013-10-10 DIAGNOSIS — I5022 Chronic systolic (congestive) heart failure: Secondary | ICD-10-CM

## 2013-10-10 LAB — BASIC METABOLIC PANEL
BUN: 15 mg/dL (ref 6–23)
CO2: 31 meq/L (ref 19–32)
Calcium: 9.2 mg/dL (ref 8.4–10.5)
Chloride: 102 mEq/L (ref 96–112)
Creatinine, Ser: 1.1 mg/dL (ref 0.4–1.2)
GFR: 67.28 mL/min (ref >60.00)
GLUCOSE: 107 mg/dL — AB (ref 70–99)
Potassium: 4.2 mEq/L (ref 3.5–5.1)
Sodium: 138 mEq/L (ref 135–145)

## 2013-10-10 NOTE — Telephone Encounter (Signed)
Aldosterone Receptor Antagonist Initiation Clinic Study - Pharmacist Baseline Follow-Up Patient was contacted via telephone today in efforts to discuss the following:  Spironolactone Order Date: 10/06/13 (picked up on date and started)  Last BMET: 09/12/13, K+ 3.9, SCr 1.1, CrCl ~ 59 ml/min   Medication Availability: yes   BMET labs were reviewed with patient   Patient was counseled on the benefits vs risks of spironolactone, including its indication, interactions, importance of adherence, and management of missed doses.   Understanding of regimen: fair   Understanding of indications: fair   Potential for compliance: good  Assessment & Plan:   63 yo AAF in good spirits this morning, has already picked up and taken medication since start date. Tried calling patient this weekend to return for 3-day follow up labs but unreachable. Patient willing to come in this afternoon for labs within the 3-5 day window. Patient reports no problem taking medication but not entirely certain what medication is for. Educated patient on indications, risks, and instructions on medication use. Denies any side effects or abnormalities. Swelling reported only upon sitting for long periods at the computer.   Next scheduled BMET: Tuesday October 10, 2013  Lori Beard. Artelia Laroche, PharmD Clinical Pharmacist - Resident Phone: [phone] Pager: [phone] 10/10/2013 9:00 AM

## 2013-10-11 ENCOUNTER — Other Ambulatory Visit: Payer: Self-pay | Admitting: Pharmacist

## 2013-10-11 DIAGNOSIS — I5022 Chronic systolic (congestive) heart failure: Secondary | ICD-10-CM

## 2013-10-13 ENCOUNTER — Other Ambulatory Visit (INDEPENDENT_AMBULATORY_CARE_PROVIDER_SITE_OTHER): Payer: 59

## 2013-10-13 DIAGNOSIS — I509 Heart failure, unspecified: Secondary | ICD-10-CM | POA: Diagnosis not present

## 2013-10-13 DIAGNOSIS — I5022 Chronic systolic (congestive) heart failure: Secondary | ICD-10-CM | POA: Diagnosis not present

## 2013-10-16 ENCOUNTER — Telehealth: Payer: Self-pay | Admitting: Pharmacist

## 2013-10-16 DIAGNOSIS — I5022 Chronic systolic (congestive) heart failure: Secondary | ICD-10-CM

## 2013-10-16 LAB — BASIC METABOLIC PANEL
BUN: 18 mg/dL (ref 6–23)
CO2: 29 meq/L (ref 19–32)
CREATININE: 1.2 mg/dL (ref 0.4–1.2)
Calcium: 9 mg/dL (ref 8.4–10.5)
Chloride: 103 mEq/L (ref 96–112)
GFR: 57.74 mL/min — ABNORMAL LOW (ref >60.00)
Glucose, Bld: 76 mg/dL (ref 70–99)
Potassium: 4.1 mEq/L (ref 3.5–5.1)
Sodium: 138 mEq/L (ref 135–145)

## 2013-10-16 NOTE — Telephone Encounter (Signed)
Aldosterone Receptor Antagonist Initiation Clinic Study - Pharmacist 3-5& 7-10 Day Follow-Up Patient was contacted via telephone today in efforts to discuss the following:  Spironolactone Order Date: 10/06/13 Last BMET: 10/10/13 K+ 4.2, SCr 1.1; 10/13/13 K+ 4.1, SCr 1.2     Medication Availability: yes   BMET labs were reviewed with patient    Reinforced benefits vs risks of spironolactone, including its indication, interactions, importance of adherence, and management of missed doses.   Has the patient been measuring his/her own blood pressure or blood glucose at home?  Yes; says SBPs have been running in the 180s but  sometimes low in the 80s (variable). Feels dizzy after taking the "round pink pill" but quickly resolves. No falls.    Does the patient  feel that his/her medications are working for him/her?  yes   Has the patient been experiencing any side effects to the medications prescribed?  Yes, see above.    Understanding of regimen: fair   Understanding of indications: fair   Potential for compliance: good  Assessment & Plan:   Spoke to patient this morning @ 9:30 AM, relayed normal results. Patient does report some slight dizziness with low BPs as well as highs. Lows typically after taking what she reports as Bidil but dizziness quickly resolves. No changes at this time but would pay close attention to her BP in subsequent visits. She is willing to come back for follow-up labs at the end of the month for monthly BMETs.   Next scheduled BMET: Friday November 03, 2013  Britt Bottom B. Artelia Laroche, PharmD Clinical Pharmacist - Resident Phone: [phone] Pager: [phone] 10/16/2013 10:06 PM

## 2013-10-17 NOTE — Progress Notes (Signed)
Quick Note:  lmtcb 2/10 - PE ______

## 2013-10-25 NOTE — H&P (Signed)
  ICD Criteria  Current LVEF:35% ;Obtained < 1 month ago.  NYHA Functional Classification: Class II  Heart Failure History:  No.  Non-Ischemic Dilated Cardiomyopathy History:  Yes, timeframe is < 3 months  Atrial Fibrillation/Atrial Flutter:  No.  Ventricular Tachycardia History:  Yes, Hemodynamic instability present, VT Type:  SVT - Polymorphic.  Cardiac Arrest History:  Yes, This was a Ventricular Tachycardia/Ventricular Fibrillation Arrest. This was NOT a bradycardia arrest.  History of Syndromes with Risk of Sudden Death:  No.  Previous ICD:  No.  Electrophysiology Study: No.  Prior MI: No.  PPM: No.  OSA:  No  Patient Life Expectancy of >=1 year: Yes.  Anticoagulation Therapy:  Patient is NOT on anticoagulation therapy.   Beta Blocker Therapy:  Yes.   Ace Inhibitor/ARB Therapy:  Yes

## 2013-10-26 ENCOUNTER — Encounter (INDEPENDENT_AMBULATORY_CARE_PROVIDER_SITE_OTHER): Payer: Commercial Managed Care - PPO

## 2013-10-31 ENCOUNTER — Ambulatory Visit: Payer: 59 | Admitting: Dietician

## 2013-11-03 ENCOUNTER — Other Ambulatory Visit (INDEPENDENT_AMBULATORY_CARE_PROVIDER_SITE_OTHER): Payer: 59

## 2013-11-03 DIAGNOSIS — I5022 Chronic systolic (congestive) heart failure: Secondary | ICD-10-CM

## 2013-11-03 LAB — BASIC METABOLIC PANEL
BUN: 24 mg/dL — AB (ref 6–23)
CO2: 31 mEq/L (ref 19–32)
Calcium: 9.3 mg/dL (ref 8.4–10.5)
Chloride: 101 mEq/L (ref 96–112)
Creatinine, Ser: 1.4 mg/dL — ABNORMAL HIGH (ref 0.4–1.2)
GFR: 48.39 mL/min — AB (ref >60.00)
GLUCOSE: 100 mg/dL — AB (ref 70–99)
POTASSIUM: 4.6 meq/L (ref 3.5–5.1)
Sodium: 138 mEq/L (ref 135–145)

## 2013-11-06 ENCOUNTER — Telehealth: Payer: Self-pay | Admitting: Pharmacist

## 2013-11-06 DIAGNOSIS — I5022 Chronic systolic (congestive) heart failure: Secondary | ICD-10-CM

## 2013-11-06 NOTE — Telephone Encounter (Signed)
Aldosterone Receptor Antagonist Initiation Clinic Study - Pharmacist Monthly Follow-Up Patient was contacted via telephone today in efforts to discuss the following:  Spironolactone Order Date: 10/06/13  Last BMET: 11/03/13, K+ 4.6, Cr 1.4 (both rises)    Medication Availability: yes   BMET labs were reviewed with patient    Reinforced benefits vs risks of spironolactone, including its indication, interactions, importance of adherence, and management of missed doses.   Has the patient been measuring his/her own blood pressure or blood glucose at home?  Yes, last reading 102/65, Pulse 90   Does the patient  feel that his/her medications are working for him/her?  yes   Has the patient been experiencing any side effects to the medications prescribed?  Yes, continues to have some dizziness after taking Bidil but has found that it resolves after sitting down or lying down    Understanding of regimen: fair   Understanding of indications: good   Potential for compliance: good  Assessment & Plan:   Spoke to patient this afternoon, relayed that potassium and SCr have gone up slightly but still within normal range. Of note, BUN:SCr was also slightly elevated so had patient drink a little bit more water. Updated medication list as patient is no longer on ceftin (completed course). I will schedule a follow-up BMET in one month   Next scheduled BMET: Friday December 01, 2013  Britt Bottom B. Artelia Laroche, PharmD Clinical Pharmacist - Resident Phone: [phone] Pager: [phone] 11/06/2013 4:03 PM

## 2013-11-09 ENCOUNTER — Encounter (INDEPENDENT_AMBULATORY_CARE_PROVIDER_SITE_OTHER): Payer: Self-pay | Admitting: Physician Assistant

## 2013-11-09 ENCOUNTER — Ambulatory Visit (INDEPENDENT_AMBULATORY_CARE_PROVIDER_SITE_OTHER): Payer: Commercial Managed Care - PPO | Admitting: Physician Assistant

## 2013-11-09 VITALS — BP 108/72 | HR 68 | Temp 97.0°F | Resp 18 | Ht 64.0 in | Wt 216.1 lb

## 2013-11-09 DIAGNOSIS — Z4651 Encounter for fitting and adjustment of gastric lap band: Secondary | ICD-10-CM

## 2013-11-09 NOTE — Patient Instructions (Signed)

## 2013-11-09 NOTE — Progress Notes (Signed)
  HISTORY: Lori Beard is a 63 y.o.female who received an AP-Standard lap-band in March 2014 by Dr. Daphine Deutscher. She comes in with stable weight since her last visit. She has no complaints of regurgitation or reflux. She describes increased hunger and portion sizes since her last visit in January. She is walking three days a week, and she hopes to increase this over time. She would like a fill today.  VITAL SIGNS: Filed Vitals:   11/09/13 1125  BP: 108/72  Pulse: 68  Temp: 97 F (36.1 C)  Resp: 18    PHYSICAL EXAM: Physical exam reveals a very well-appearing 63 y.o.female in no apparent distress Neurologic: Awake, alert, oriented Psych: Bright affect, conversant Respiratory: Breathing even and unlabored. No stridor or wheezing Abdomen: Soft, nontender, nondistended to palpation. Incisions well-healed. No incisional hernias. Port easily palpated. Extremities: Atraumatic, good range of motion.  ASSESMENT: 63 y.o.  female  s/p AP Standard lap-band.   PLAN: The patient's port was accessed with a 20G Huber needle without difficulty. Clear fluid was aspirated and 0.5 mL saline was added to the port. The patient was able to swallow water without difficulty following the procedure and was instructed to take clear liquids for the next 24-48 hours and advance slowly as tolerated. We'll have her back in two months.

## 2013-11-14 ENCOUNTER — Ambulatory Visit (INDEPENDENT_AMBULATORY_CARE_PROVIDER_SITE_OTHER): Payer: 59 | Admitting: Internal Medicine

## 2013-11-14 ENCOUNTER — Encounter: Payer: Self-pay | Admitting: Internal Medicine

## 2013-11-14 VITALS — BP 126/80 | HR 77 | Ht 63.5 in | Wt 219.4 lb

## 2013-11-14 DIAGNOSIS — I428 Other cardiomyopathies: Secondary | ICD-10-CM

## 2013-11-14 DIAGNOSIS — I5022 Chronic systolic (congestive) heart failure: Secondary | ICD-10-CM

## 2013-11-14 DIAGNOSIS — Z95810 Presence of automatic (implantable) cardiac defibrillator: Secondary | ICD-10-CM | POA: Diagnosis not present

## 2013-11-14 DIAGNOSIS — I509 Heart failure, unspecified: Secondary | ICD-10-CM

## 2013-11-14 LAB — MDC_IDC_ENUM_SESS_TYPE_INCLINIC
Brady Statistic RV Percent Paced: 0 %
Date Time Interrogation Session: 20150310094145
HighPow Impedance: 65.25 Ohm
Lead Channel Pacing Threshold Amplitude: 0.75 V
Lead Channel Pacing Threshold Amplitude: 0.75 V
Lead Channel Pacing Threshold Pulse Width: 0.5 ms
Lead Channel Pacing Threshold Pulse Width: 0.5 ms
Lead Channel Sensing Intrinsic Amplitude: 12 mV
Lead Channel Setting Pacing Amplitude: 2.5 V
Lead Channel Setting Pacing Pulse Width: 0.5 ms
Lead Channel Setting Sensing Sensitivity: 0.5 mV
MDC IDC MSMT BATTERY REMAINING LONGEVITY: 97.2 mo
MDC IDC MSMT LEADCHNL RV IMPEDANCE VALUE: 487.5 Ohm
MDC IDC PG SERIAL: 7136396
MDC IDC SET ZONE DETECTION INTERVAL: 320 ms
Zone Setting Detection Interval: 250 ms

## 2013-11-14 NOTE — Assessment & Plan Note (Signed)
Her St. [name] ICD is working normally. Will recheck in several months. 

## 2013-11-14 NOTE — Patient Instructions (Signed)
Your physician wants you to follow-up in: Nov 2015 with Dr Court Joy will receive a reminder letter in the mail two months in advance. If you don't receive a letter, please call our office to schedule the follow-up appointment.  Remote monitoring is used to monitor your Pacemaker or ICD from home. This monitoring reduces the number of office visits required to check your device to one time per year. It allows [place] to keep an eye on the functioning of your device to ensure it is working properly. You are scheduled for a device check from home on 02/15/14. You may send your transmission at any time that day. If you have a wireless device, the transmission will be sent automatically. After your physician reviews your transmission, you will receive a postcard with your next transmission date.

## 2013-11-14 NOTE — Assessment & Plan Note (Signed)
Her symptoms remain class 2 on maximal medical therapy. She will continue her current meds. I have asked her to maintain a low sodium diet.

## 2013-11-14 NOTE — Progress Notes (Signed)
HPI Lori Beard returns today for followup. She is a pleasant 63 yo woman with non-ischemic CM, chronic systolic heart failure, VF arrest and HTN. She has done well in the interim. No chest pain or sob. No syncope or ICD shock. Allergies  Allergen Reactions  . Ace Inhibitors Cough  . Iron Other (See Comments)    Gives patient migraine headaches when supplemented  . Morphine And Related Itching and Rash     Current Outpatient Prescriptions  Medication Sig Dispense Refill  . acetaminophen (TYLENOL) 325 MG tablet Take 1-2 tablets (325-650 mg total) by mouth every 4 (four) hours as needed for mild pain.      [name]. amitriptyline (ELAVIL) 25 MG tablet Take 75 mg by mouth at bedtime.      [name]. aspirin 81 MG tablet Take 1 tablet (81 mg total) by mouth every evening.      [name]. atorvastatin (LIPITOR) 10 MG tablet Take 1 tablet (10 mg total) by mouth daily at 6 PM.  30 tablet  3  . carvedilol (COREG) 12.5 MG tablet Take 1.5 tablets (18.75 mg total) by mouth 2 (two) times daily with a meal.  60 tablet  3  . furosemide (LASIX) 40 MG tablet Take 40 mg by mouth every morning.       [name]. HYDROcodone-acetaminophen (NORCO/VICODIN) 5-325 MG per tablet Take 2 tablets by mouth every 6 (six) hours as needed for pain.  20 tablet  0  . isosorbide-hydrALAZINE (BIDIL) 20-37.5 MG per tablet Take 1 tablet by mouth 3 (three) times daily.  90 tablet  3  . losartan (COZAAR) 50 MG tablet Take 1 tablet (50 mg total) by mouth daily.  90 tablet  3  . Multiple Vitamin (MULTIVITAMIN) tablet Take 1 tablet by mouth daily.      . pantoprazole (PROTONIX) 40 MG tablet Take 40 mg by mouth every morning.       . potassium chloride SA (K-DUR,KLOR-CON) 20 MEQ tablet Take 2 tablets (40 mEq total) by mouth daily.  180 tablet  3  . spironolactone (ALDACTONE) 25 MG tablet Take 1 tablet (25 mg total) by mouth daily.  30 tablet  1   No current facility-administered medications for this visit.     Past Medical History  Diagnosis Date  .  Hypertension   . Morbid obesity     a. s/p lapband surgery 11/2012.  [name]. GERD (gastroesophageal reflux disease)   . Arthritis     ALL OVER  . Sleep apnea 02/15/2012    SLEEP STUDY IN EPIC - MILD OSA-CPAP NOT RECOMMENDED-HOME OXYGEN SUGGESTED BECAUSE OF OXYGEN DESATS ON ROOM AIR.  [name]. Cardiac arrest 08/01/2013    Polymorphic VT  . NICM (nonischemic cardiomyopathy)     Echocardiogram (08/01/13): Mild LVH, global HK, EF 40-45%, Gr 1 DD, MAC, mild RVE, mildly reduced RVSF, PASP 31-35.  LHC (08/02/13):  Normal coronary arteries, EF 35%, LVEDP 17.  . Chronic systolic CHF (congestive heart failure)   . Ventricular tachycardia     a. R on T PVC during admission for pneumonia/NSTEMI => monomorphic VT=>VF=>defib; s/p ICD  . S/P implantation of automatic cardioverter/defibrillator (AICD)     07/2013 Lori Ridgel(Terrah Decoster)  . Myocardial infarction 08/06/13    ROS:   All systems reviewed and negative except as noted in the HPI.   Past Surgical History  Procedure Laterality Date  . Abdominal hysterectomy    . Knee arthroscopy    . Cholecystectomy    . Breath tek h  pylori  02/15/2012    Procedure: BREATH TEK H PYLORI;  Surgeon: Valarie Merino, MD;  Location: [facility];  Service: General;  Laterality: N/A;  745  . Joint replacement      BILATERAL TOTAL HIP REPLACEMENTS  . Mesh applied to lap port N/A 11/22/2012    Procedure: MESH APPLIED TO LAP PORT;  Surgeon: Valarie Merino, MD;  Location: [facility];  Service: General;  Laterality: N/A;  . Laparoscopic gastric banding    . Pacemaker insertion   08/04/2013    St. [name] single chamber ICD  . Cardiac catheterization  08/02/2013    Normal coronary arteries, EF 35%      Family History  Problem Relation Age of Onset  . Kidney disease Mother   . Heart disease Father   . Cancer Father     colon  . Cancer Sister     breast     History   Social History  . Marital Status: Married    Spouse Name: N/A    Number of Children: N/A  . Years of Education:  N/A   Occupational History  . Not on file.   Social History Main Topics  . Smoking status: Never Smoker   . Smokeless tobacco: Never Used  . Alcohol Use: No  . Drug Use: No  . Sexual Activity: Not Currently   Other Topics Concern  . Not on file   Social History Narrative  . No narrative on file     BP 126/80  Pulse 77  Ht 5' 3.5" (1.613 m)  Wt 219 lb 6.4 oz (99.519 kg)  BMI 38.25 kg/m2  Physical Exam:  Well appearing 63 yo woman, NAD HEENT: Unremarkable Neck:  No JVD, no thyromegally Back:  No CVA tenderness Lungs:  Clear with no wheezes, well healed ICD incision. HEART:  Regular rate rhythm, no murmurs, no rubs, no clicks Abd:  soft, positive bowel sounds, no organomegally, no rebound, no guarding Ext:  2 plus pulses, no edema, no cyanosis, no clubbing Skin:  No rashes no nodules Neuro:  CN II through XII intact, motor grossly intact   DEVICE  Normal device function.  See PaceArt for details.   Assess/Plan:

## 2013-11-20 ENCOUNTER — Encounter: Payer: 59 | Attending: Surgery | Admitting: Dietician

## 2013-11-20 VITALS — Ht 64.0 in | Wt 214.5 lb

## 2013-11-20 DIAGNOSIS — Z713 Dietary counseling and surveillance: Secondary | ICD-10-CM | POA: Insufficient documentation

## 2013-11-20 DIAGNOSIS — E669 Obesity, unspecified: Secondary | ICD-10-CM

## 2013-11-20 NOTE — Progress Notes (Signed)
Follow-up visit:  12 Month Post-Operative LAGB Surgery  Medical Nutrition Therapy:  Appt start time: 515  End time: 188.  Primary concerns today: Post-operative Bariatric Surgery Nutrition Management. Lori Beard is here for a follow up for her LAGB surgery. She had a heart attack in November as well as undiagnosed pneumonia. Her doctor just cleared her to start walking again. She reports frequent indigestion since her heart attack, unresolved with medicine. Trying to drink more water. Just had a fill (0.5 mL on 11/09/2013), "going okay."   Surgery date: 11/22/12 Surgery type: LAGB [facility] weight at [facility]: 256.5 lbs (02/05/12)  Weight today: 214.5 lbs Weight change:1.5 lbs gain Total weight lost: 42 lbs  Goal weight: 200 lbs % goal met: 77%  TANITA  BODY COMP RESULTS  12/06/12 01/04/13 02/01/13 05/03/13 11/20/13   BMI (kg/m^2) 42.2 40.7 38.9  36.6 36.8   Fat Mass (lbs) 119.0 108.5 99.5 88.5 99.5   Fat Free Mass (lbs) 123.0 125.0 127.0 124.5 115   Total Body Water (lbs) 90.0 91.5 93.0 91 84   24-hr recall: B (AM): oatmeal or [place] sausage or egg and cheese, coffee or tea Snk (AM): NONE  L (PM): usually none Snk (PM):  NONE D (PM): beans or salad (eats very little) Snk (PM):  Cheese and deli ham (1-1.5 oz) - 13g  Fluid intake: not enough per pt report Estimated total protein intake: 20-30g  Medications: see list Supplementation:  Taking MVI, not taking Calcium  Using straws: No Drinking while eating: sometimes, depending on what she eats  Hair loss: No Carbonated beverages:  Soda every now and then N/V/D/C:  N/V with some meats Last Lap-Band fill:  11/09/2013 (0.5 mL)  Recent physical activity:  Just got cleared to start back  Progress Towards Goal(s):  In progress.   Nutritional Diagnosis:  [place]-3.3 Overweight/obesity related to past poor dietary habits and physical inactivity as evidenced by patient w/ recent LAGB surgery following dietary guidelines for continued weight loss.     Intervention:  Nutrition education/diet advancement.  Monitoring/Evaluation:  Dietary intake, exercise, lap band fills, and body weight. Follow up in 3 months for 15 month post-op visit.

## 2013-11-20 NOTE — Patient Instructions (Addendum)
-  Try to eat more consistently -Increase protein foods: eggs, cheese, deli meat, protein shakes -Start back taking Calcium    12/06/12 01/04/13 02/01/13 05/03/13 11/20/13   BMI (kg/m^2) 42.2 40.7 38.9  36.6 36.8   Fat Mass (lbs) 119.0 108.5 99.5 88.5 99.5   Fat Free Mass (lbs) 123.0 125.0 127.0 124.5 115   Total Body Water (lbs) 90.0 91.5 93.0 91 84

## 2013-11-24 ENCOUNTER — Other Ambulatory Visit: Payer: Self-pay | Admitting: Physician Assistant

## 2013-12-01 ENCOUNTER — Other Ambulatory Visit: Payer: Self-pay | Admitting: *Deleted

## 2013-12-01 ENCOUNTER — Other Ambulatory Visit: Payer: Self-pay

## 2013-12-01 ENCOUNTER — Other Ambulatory Visit (INDEPENDENT_AMBULATORY_CARE_PROVIDER_SITE_OTHER): Payer: 59

## 2013-12-01 DIAGNOSIS — I5022 Chronic systolic (congestive) heart failure: Secondary | ICD-10-CM | POA: Diagnosis not present

## 2013-12-01 DIAGNOSIS — R7989 Other specified abnormal findings of blood chemistry: Secondary | ICD-10-CM

## 2013-12-01 LAB — BASIC METABOLIC PANEL
BUN: 29 mg/dL — ABNORMAL HIGH (ref 6–23)
CALCIUM: 9.2 mg/dL (ref 8.4–10.5)
CO2: 31 mEq/L (ref 19–32)
Chloride: 99 mEq/L (ref 96–112)
Creatinine, Ser: 2 mg/dL — ABNORMAL HIGH (ref 0.4–1.2)
GFR: 32.89 mL/min — AB (ref >60.00)
GLUCOSE: 142 mg/dL — AB (ref 70–99)
POTASSIUM: 4.2 meq/L (ref 3.5–5.1)
SODIUM: 138 meq/L (ref 135–145)

## 2013-12-01 MED ORDER — SPIRONOLACTONE 25 MG PO TABS: 25.0000 mg | ORAL_TABLET | Freq: Every day | ORAL | Status: DC

## 2013-12-05 ENCOUNTER — Telehealth: Payer: Self-pay | Admitting: Pharmacist

## 2013-12-05 NOTE — Telephone Encounter (Signed)
Aldosterone Receptor Antagonist Initiation Clinic Study - Pharmacist Second Monthly Follow-Up Patient was contacted via telephone today in efforts to discuss the following:  Spironolactone Order Date: 10/06/13 Last BMET: 12/01/13, K+ 4.2, SCr 2.0 (up from 1.4)    Medication Availability: yes   BMET labs were reviewed with patient    Reinforced benefits vs risks of spironolactone, including its indication, interactions, importance of adherence, and management of missed doses.   Does the patient  feel that his/her medications are working for him/her?  yes   Has the patient been experiencing any side effects to the medications prescribed?  no   Understanding of regimen: fair   Understanding of indications: good   Potential for compliance: good  Assessment & Plan:   Spironolactone discontinued in this patient due to recent SCr bump up to 2.0, scheduled for recheck BMET in 2 weeks (December 15, 2013). Spoke to patient today to address any concerns, which she denies. She reports no problems/adverse symptoms while taking medication.  Next scheduled BMET: December 15, 2013  Britt Bottom B. Artelia Laroche, PharmD Clinical Pharmacist - Resident Phone: [phone] Pager: [phone] 12/05/2013 10:42 AM

## 2013-12-15 ENCOUNTER — Other Ambulatory Visit (INDEPENDENT_AMBULATORY_CARE_PROVIDER_SITE_OTHER): Payer: 59

## 2013-12-15 DIAGNOSIS — R799 Abnormal finding of blood chemistry, unspecified: Secondary | ICD-10-CM

## 2013-12-15 DIAGNOSIS — R7989 Other specified abnormal findings of blood chemistry: Secondary | ICD-10-CM

## 2013-12-15 LAB — BASIC METABOLIC PANEL
BUN: 26 mg/dL — ABNORMAL HIGH (ref 6–23)
CALCIUM: 9.2 mg/dL (ref 8.4–10.5)
CHLORIDE: 102 meq/L (ref 96–112)
CO2: 31 meq/L (ref 19–32)
CREATININE: 1.9 mg/dL — AB (ref 0.4–1.2)
GFR: 33.67 mL/min — ABNORMAL LOW (ref >60.00)
Glucose, Bld: 112 mg/dL — ABNORMAL HIGH (ref 70–99)
Potassium: 3.9 mEq/L (ref 3.5–5.1)
Sodium: 139 mEq/L (ref 135–145)

## 2013-12-18 ENCOUNTER — Telehealth: Payer: Self-pay | Admitting: Internal Medicine

## 2013-12-18 ENCOUNTER — Telehealth: Payer: Self-pay | Admitting: Cardiovascular Disease

## 2013-12-18 NOTE — Telephone Encounter (Signed)
Pt stopped spironolactone 01/02/14 has f/u app already/ pt aware.

## 2013-12-18 NOTE — Telephone Encounter (Signed)
Follow up      Pt would like a call back about her lab results please.

## 2013-12-18 NOTE — Telephone Encounter (Signed)
Patient aware of her lab results and the need to repeat in 2 weeks.  She is going to stop Aldactone

## 2013-12-18 NOTE — Telephone Encounter (Signed)
New message    Patient calling regarding medication that she has already stop taken - 25 mg  ronolactone .

## 2013-12-21 ENCOUNTER — Other Ambulatory Visit: Payer: Self-pay | Admitting: Physician Assistant

## 2014-01-01 ENCOUNTER — Ambulatory Visit: Payer: 59 | Admitting: Cardiovascular Disease

## 2014-01-02 ENCOUNTER — Encounter: Payer: Self-pay | Admitting: Cardiovascular Disease

## 2014-01-02 ENCOUNTER — Ambulatory Visit (INDEPENDENT_AMBULATORY_CARE_PROVIDER_SITE_OTHER): Payer: 59 | Admitting: Cardiovascular Disease

## 2014-01-02 VITALS — BP 130/70 | HR 85 | Ht 64.0 in | Wt 218.0 lb

## 2014-01-02 DIAGNOSIS — I5022 Chronic systolic (congestive) heart failure: Secondary | ICD-10-CM | POA: Diagnosis not present

## 2014-01-02 DIAGNOSIS — I10 Essential (primary) hypertension: Secondary | ICD-10-CM

## 2014-01-02 DIAGNOSIS — I509 Heart failure, unspecified: Secondary | ICD-10-CM

## 2014-01-02 MED ORDER — CARVEDILOL 25 MG PO TABS: 25.0000 mg | ORAL_TABLET | Freq: Two times a day (BID) | ORAL | Status: DC

## 2014-01-02 NOTE — Progress Notes (Signed)
[address] [place], [place]  [address] Phone: [phone] Fax:  [phone]  Date:  01/02/2014   ID:  Lori Beard, DOB 11-16-1950, MRN [id number]  PCP:  Lupe Carney, MD  Cardiologist:  Dr. Delane Ginger   Electrophysiologist:  Dr. [name]    History of Present Illness: Notes from [place] [facility] - 09/04/14:  Lori Beard is a 63 y.o. female with a hx of HTN, obesity s/p LAP-BAND surgery and sleep apnea. She was admitted 07/2013 with pneumonia. She ruled in for a NSTEMI. Her hospitalization was complicated by monomorphic VTach that progressed to VFib due to R on T PVC in the setting of hypokalemia with a K of 2.9.  Echocardiogram (08/01/13): Mild LVH, global HK, EF 40-45%, Gr 1 DD, MAC, mild RVE, mildly reduced RVSF, PASP 31-35.  LHC (08/02/13):  Normal coronary arteries, EF 35%, LVEDP 17.  She was seen by EP and underwent ICD implantation for secondary prevention of malignant ventricular arrhythmias in the setting of nonischemic cardiomyopathy. She underwent St. [name] single-chamber defibrillator implantation by Dr. [name] 08/04/13.  I saw her 08/11/13.  She had resumed verapamil and metoprolol due to high blood pressures. I stopped these medications and placed on losartan and increased her BiDil to 3x per day.    Doing well since last seen.  The patient denies chest pain, shortness of breath, syncope, orthopnea, PND or significant pedal edema.  No ICD shocks.   Jan. 30, 2015:  [name] presents today for followup evaluation of her hypertension and chronic systolic congestive heart failure. Her left ventricular ejection fraction is approximate 35%. She has a history of a cardiac arrest and has had an ICD placed. She sees Dr. [name] for management of that.  12/27/2013:    Recent Labs: 08/01/2013: ALT 11; Pro B Natriuretic peptide (BNP) 2445.0*  08/02/2013: HDL Cholesterol 14*; LDL (calc) 73  08/04/2013: Hemoglobin 10.7*  12/15/2013: Creatinine 1.9*;  Potassium 3.9   Wt Readings from Last 3 Encounters:  01/02/14 218 lb (98.884 kg)  11/20/13 214 lb 8 oz (97.297 kg)  11/14/13 219 lb 6.4 oz (99.519 kg)     Past Medical History  Diagnosis Date  . Hypertension   . Morbid obesity     a. s/p lapband surgery 11/2012.  [name] GERD (gastroesophageal reflux disease)   . Arthritis     ALL OVER  . Sleep apnea 02/15/2012    SLEEP STUDY IN EPIC - MILD OSA-CPAP NOT RECOMMENDED-HOME OXYGEN SUGGESTED BECAUSE OF OXYGEN DESATS ON ROOM AIR.  [name] Cardiac arrest 08/01/2013    Polymorphic VT  . NICM (nonischemic cardiomyopathy)     Echocardiogram (08/01/13): Mild LVH, global HK, EF 40-45%, Gr 1 DD, MAC, mild RVE, mildly reduced RVSF, PASP 31-35.  LHC (08/02/13):  Normal coronary arteries, EF 35%, LVEDP 17.  . Chronic systolic CHF (congestive heart failure)   . Ventricular tachycardia     a. R on T PVC during admission for pneumonia/NSTEMI => monomorphic VT=>VF=>defib; s/p ICD  . S/P implantation of automatic cardioverter/defibrillator (AICD)     07/2013 [name])  . Myocardial infarction 08/06/13    Current Outpatient Prescriptions  Medication Sig Dispense Refill  . acetaminophen (TYLENOL) 325 MG tablet Take 1-2 tablets (325-650 mg total) by mouth every 4 (four) hours as needed for mild pain.      [name] amitriptyline (ELAVIL) 25 MG tablet Take 75 mg by mouth at bedtime.      [name] aspirin 81 MG tablet Take 1  tablet (81 mg total) by mouth every evening.      [name]. atorvastatin (LIPITOR) 10 MG tablet TAKE 1 TABLET BY MOUTH DAILY AT 6 PM.  30 tablet  0  . carvedilol (COREG) 12.5 MG tablet TAKE 1 & 1/2 TABLETS BY MOUTH 2 TIMES DAILY WITH A MEAL.  90 tablet  1  . furosemide (LASIX) 40 MG tablet Take 40 mg by mouth every morning.       [name]. HYDROcodone-acetaminophen (NORCO/VICODIN) 5-325 MG per tablet Take 2 tablets by mouth every 6 (six) hours as needed for pain.  20 tablet  0  . isosorbide-hydrALAZINE (BIDIL) 20-37.5 MG per tablet Take 1 tablet by mouth 3 (three) times daily.   90 tablet  3  . losartan (COZAAR) 50 MG tablet Take 1 tablet (50 mg total) by mouth daily.  90 tablet  3  . Multiple Vitamin (MULTIVITAMIN) tablet Take 1 tablet by mouth daily.      . pantoprazole (PROTONIX) 40 MG tablet Take 40 mg by mouth every morning.       . potassium chloride SA (K-DUR,KLOR-CON) 20 MEQ tablet Take 2 tablets (40 mEq total) by mouth daily.  180 tablet  3   No current facility-administered medications for this visit.    Allergies:   Ace inhibitors; Iron; and Morphine and related   Social History:  The patient  reports that she has never smoked. She has never used smokeless tobacco. She reports that she does not drink alcohol or use illicit drugs.   Family History:  The patient's family history includes Cancer in her father and sister; Heart disease in her father; Kidney disease in her mother.   ROS:  Please see the history of present illness.    All other systems reviewed and negative.   PHYSICAL EXAM: VS:  BP 130/70  Pulse 85  Ht 5\' 4"  (1.626 m)  Wt 218 lb (98.884 kg)  BMI 37.40 kg/m2 Well nourished, well developed, in no acute distress HEENT: normal Neck: no JVD Cardiac:  normal S1, S2; RRR; no murmur Lungs:  clear to auscultation bilaterally, no wheezing, rhonchi or rales Abd: soft, nontender, no hepatomegaly Ext: no edema  Skin: warm and dry Neuro:  CNs 2-12 intact, no focal abnormalities noted  EKG:       ASSESSMENT AND PLAN:

## 2014-01-02 NOTE — Patient Instructions (Signed)
Your physician has recommended you make the following change in your medication:  INCREASE Coreg to 25 mg twice daily  Your physician wants you to follow-up in: 6 months with Dr. Elease Hashimoto.  You will receive a reminder letter in the mail two months in advance. If you don't receive a letter, please call our office to schedule the follow-up appointment.

## 2014-01-02 NOTE — Assessment & Plan Note (Signed)
Lori Beard is doing well.  [name] tolerating her medications without any problems. We'll increase her carvedilol to 25 mg twice a day. I've encouraged her to continue to watch her salt intake. She should exercise as much as possible. I'll see her again in 6 months for followup visit.  She has an ICD that is followed by Dr. [name].

## 2014-01-02 NOTE — Assessment & Plan Note (Signed)
Her BP is well controlled 

## 2014-01-18 ENCOUNTER — Ambulatory Visit (INDEPENDENT_AMBULATORY_CARE_PROVIDER_SITE_OTHER): Payer: Commercial Managed Care - PPO | Admitting: Physician Assistant

## 2014-01-18 ENCOUNTER — Encounter (INDEPENDENT_AMBULATORY_CARE_PROVIDER_SITE_OTHER): Payer: Self-pay

## 2014-01-18 VITALS — BP 124/70 | HR 80 | Temp 98.7°F | Ht 64.0 in | Wt 218.8 lb

## 2014-01-18 DIAGNOSIS — R141 Gas pain: Secondary | ICD-10-CM | POA: Diagnosis not present

## 2014-01-18 DIAGNOSIS — Z9884 Bariatric surgery status: Secondary | ICD-10-CM

## 2014-01-18 DIAGNOSIS — R142 Eructation: Secondary | ICD-10-CM

## 2014-01-18 DIAGNOSIS — R143 Flatulence: Secondary | ICD-10-CM | POA: Diagnosis not present

## 2014-01-18 NOTE — Patient Instructions (Signed)
Return after your x-ray. Focus on good food choices as well as physical activity. Return sooner if you have an increase in hunger, portion sizes or weight. Return also for difficulty swallowing, night cough, reflux.

## 2014-01-18 NOTE — Progress Notes (Addendum)
  HISTORY: Lori Beard is a 63 y.o.female who received an AP-Standard lap-band in March 2014 by Dr. Daphine Deutscher. She comes in with close to 3 lbs weight gain since her last visit in March. She describes no hunger and her portions remain small. She says she may actually not be eating enough. She does complain of increased belching after eating or drinking. She only has regurgitation with meats, which she tends to avoid. She has no nausea symptoms. Other solids do fine.  VITAL SIGNS: Filed Vitals:   01/18/14 1414  BP: 124/70  Pulse: 80  Temp: 98.7 F (37.1 C)    PHYSICAL EXAM: Physical exam reveals a very well-appearing 63 y.o.female in no apparent distress Neurologic: Awake, alert, oriented Psych: Bright affect, conversant Respiratory: Breathing even and unlabored. No stridor or wheezing Extremities: Atraumatic, good range of motion. Skin: Warm, Dry, no rashes Musculoskeletal: Normal gait, Joints normal  ASSESMENT: 63 y.o.  female  s/p AP-Standard lap-band.   PLAN: i've scheduled an upper GI to evaluate her band anatomy and function. This may be simple aerophagia but it doesn't appear that she's over-restricted. We'll see her back in the clinic following her study.    The patient had a ICD placed 08/04/2013 by Dr. Rosette Reveal. CT scan on 08/01/2014 appears to show the lap band in good position.  Ovidio Kin, MD, [facility] Surgery Pager: [phone] Office phone:  [phone]

## 2014-01-19 ENCOUNTER — Other Ambulatory Visit: Payer: 59

## 2014-01-23 ENCOUNTER — Ambulatory Visit
Admission: RE | Admit: 2014-01-23 | Discharge: 2014-01-23 | Disposition: A | Discharge status: Home / Self Care | Payer: 59 | Source: Ambulatory Visit | Attending: Physician Assistant | Admitting: Physician Assistant

## 2014-01-23 DIAGNOSIS — Z9884 Bariatric surgery status: Secondary | ICD-10-CM

## 2014-01-23 DIAGNOSIS — K2289 Other specified disease of esophagus: Secondary | ICD-10-CM | POA: Diagnosis not present

## 2014-01-23 DIAGNOSIS — R142 Eructation: Secondary | ICD-10-CM

## 2014-01-23 DIAGNOSIS — K228 Other specified diseases of esophagus: Secondary | ICD-10-CM | POA: Diagnosis not present

## 2014-01-25 ENCOUNTER — Encounter (INDEPENDENT_AMBULATORY_CARE_PROVIDER_SITE_OTHER): Payer: Self-pay

## 2014-01-25 ENCOUNTER — Ambulatory Visit (INDEPENDENT_AMBULATORY_CARE_PROVIDER_SITE_OTHER): Payer: Commercial Managed Care - PPO | Admitting: Physician Assistant

## 2014-01-25 VITALS — BP 130/84 | HR 72 | Temp 98.6°F | Resp 14 | Ht 64.0 in | Wt 218.0 lb

## 2014-01-25 DIAGNOSIS — Z4651 Encounter for fitting and adjustment of gastric lap band: Secondary | ICD-10-CM | POA: Diagnosis not present

## 2014-01-25 NOTE — Progress Notes (Signed)
  HISTORY: Lori Beard is a 63 y.o.female who received an AP-Standard lap-band in March 2014 by Dr. Daphine Deutscher. She comes in one week following her Upper GI, which showed good passage of barium with no evidence of slip. She had some tertiary contractions. She says she still has some burping. We talked about modifying some eating/drinking behaviors, including avoiding talking during meals and avoiding swallowing excessive air. She does need a fill, however, as she has pretty significant hunger and larger than desired portions.  VITAL SIGNS: Filed Vitals:   01/25/14 1130  BP: 130/84  Pulse: 72  Temp: 98.6 F (37 C)  Resp: 14    PHYSICAL EXAM: Physical exam reveals a very well-appearing 63 y.o.female in no apparent distress Neurologic: Awake, alert, oriented Psych: Bright affect, conversant Respiratory: Breathing even and unlabored. No stridor or wheezing Abdomen: Soft, nontender, nondistended to palpation. Incisions well-healed. No incisional hernias. Port easily palpated. Extremities: Atraumatic, good range of motion.  ASSESMENT: 63 y.o.  female  s/p AP-Standard lap-band.   PLAN: The patient's port was accessed with a 20G Huber needle without difficulty. Clear fluid was aspirated and 0.5 mL saline was added to the port. The patient was able to swallow water without difficulty following the procedure and was instructed to take clear liquids for the next 24-48 hours and advance slowly as tolerated.

## 2014-01-25 NOTE — Patient Instructions (Signed)

## 2014-01-31 ENCOUNTER — Other Ambulatory Visit: Payer: Self-pay | Admitting: Cardiovascular Disease

## 2014-02-07 ENCOUNTER — Telehealth (INDEPENDENT_AMBULATORY_CARE_PROVIDER_SITE_OTHER): Payer: Self-pay

## 2014-02-07 ENCOUNTER — Encounter (INDEPENDENT_AMBULATORY_CARE_PROVIDER_SITE_OTHER): Payer: Self-pay | Admitting: Surgery

## 2014-02-07 ENCOUNTER — Ambulatory Visit (INDEPENDENT_AMBULATORY_CARE_PROVIDER_SITE_OTHER): Payer: Commercial Managed Care - PPO | Admitting: Surgery

## 2014-02-07 VITALS — BP 114/72 | HR 71 | Temp 97.2°F | Resp 16 | Ht 64.0 in | Wt 209.8 lb

## 2014-02-07 DIAGNOSIS — R1110 Vomiting, unspecified: Secondary | ICD-10-CM

## 2014-02-07 DIAGNOSIS — Z4651 Encounter for fitting and adjustment of gastric lap band: Secondary | ICD-10-CM | POA: Diagnosis not present

## 2014-02-07 NOTE — Progress Notes (Signed)
Lapband Fill Encounter:  Called in to say that she couldn't keep anything down and food was coming back.  Problem List:   Patient Active Problem List   Diagnosis Date Noted  . Chronic systolic CHF (congestive heart failure)   . S/P implantation of automatic cardioverter/defibrillator (AICD)   . NICM (nonischemic cardiomyopathy) 08/03/2013  . Ventricular tachycardia arrest 08/02/2013  . Hypokalemia 08/02/2013  . Elevated troponin 08/02/2013  . Pneumonia 08/01/2013  . HTN (hypertension) 08/01/2013  . Anemia 08/01/2013  . Thrombocytopenia 08/01/2013  . Renal failure, acute 08/01/2013  . Lapband APS + hiatus hernia repair March 2014 11/23/2012  . Arthritis-bilateral hip replacements 11/10/2012  . Obesity 01/22/2012    Argie Ramming Bashor Body mass index is 35.99 kg/(m^2). Weight loss since surgery  47.8  Having regurgitation?:  yes  Feel that they need a fill?  No-unfill  Nocturnal reflux?  yes  Amount of fill  -1     Instructions given and weight loss goals discussed.    UGI reviewed and symptoms of food buildup noted.  1 cc removed.  She will see Mardelle Matte next month.    Matt B. Daphine Deutscher, MD, FACS

## 2014-02-07 NOTE — Telephone Encounter (Signed)
Pt is a lap band pt, called stating that she is not able to hold down liquids or foods for a few days now. Pt concerned as to what can be done. Please advise.

## 2014-02-15 ENCOUNTER — Ambulatory Visit (INDEPENDENT_AMBULATORY_CARE_PROVIDER_SITE_OTHER): Payer: 59 | Admitting: *Deleted

## 2014-02-15 DIAGNOSIS — I509 Heart failure, unspecified: Secondary | ICD-10-CM

## 2014-02-15 DIAGNOSIS — I428 Other cardiomyopathies: Secondary | ICD-10-CM

## 2014-02-15 DIAGNOSIS — I5022 Chronic systolic (congestive) heart failure: Secondary | ICD-10-CM

## 2014-02-15 NOTE — Progress Notes (Signed)
Remote ICD transmission.   

## 2014-02-20 ENCOUNTER — Ambulatory Visit: Payer: 59 | Admitting: Dietician

## 2014-02-27 LAB — MDC_IDC_ENUM_SESS_TYPE_REMOTE
Battery Remaining Longevity: 96 mo
Battery Remaining Percentage: 93 %
Battery Voltage: 3.19 V
Brady Statistic RV Percent Paced: 1 %
Date Time Interrogation Session: 20150611131448
HIGH POWER IMPEDANCE MEASURED VALUE: 65 Ohm
HIGH POWER IMPEDANCE MEASURED VALUE: 69 Ohm
Implantable Pulse Generator Serial Number: 7136396
Lead Channel Impedance Value: 450 Ohm
Lead Channel Pacing Threshold Amplitude: 0.75 V
Lead Channel Pacing Threshold Pulse Width: 0.5 ms
Lead Channel Sensing Intrinsic Amplitude: 11.5 mV
Lead Channel Setting Pacing Amplitude: 2.5 V
Lead Channel Setting Pacing Pulse Width: 0.5 ms
Lead Channel Setting Sensing Sensitivity: 0.5 mV
MDC IDC SET ZONE DETECTION INTERVAL: 250 ms
MDC IDC SET ZONE DETECTION INTERVAL: 320 ms

## 2014-03-14 ENCOUNTER — Encounter: Payer: Self-pay | Admitting: Cardiology

## 2014-03-19 ENCOUNTER — Encounter: Payer: Self-pay | Admitting: Internal Medicine

## 2014-04-19 ENCOUNTER — Ambulatory Visit (INDEPENDENT_AMBULATORY_CARE_PROVIDER_SITE_OTHER): Payer: Commercial Managed Care - PPO | Admitting: Physician Assistant

## 2014-04-19 ENCOUNTER — Encounter (INDEPENDENT_AMBULATORY_CARE_PROVIDER_SITE_OTHER): Payer: Self-pay

## 2014-04-19 VITALS — BP 124/84 | HR 75 | Temp 97.0°F | Ht 64.0 in | Wt 216.6 lb

## 2014-04-19 DIAGNOSIS — Z4651 Encounter for fitting and adjustment of gastric lap band: Secondary | ICD-10-CM | POA: Diagnosis not present

## 2014-04-19 NOTE — Patient Instructions (Signed)

## 2014-04-19 NOTE — Progress Notes (Signed)
  HISTORY: Lori Beard is a 63 y.o.female who received an AP-Standard lap-band in March 2014 by Dr. Daphine Deutscher. The patient has gained 7 lbs since their last visit in June, and has lost 41 lbs since surgery. She is still able to eat more than desired and has some hunger but no further regurgitation or reflux. She'd like a fill today but not to the same volume that she had when she was obstructed.  VITAL SIGNS: Filed Vitals:   04/19/14 1049  BP: 124/84  Pulse: 75  Temp: 97 F (36.1 C)    PHYSICAL EXAM: Physical exam reveals a very well-appearing 63 y.o.female in no apparent distress Neurologic: Awake, alert, oriented Psych: Bright affect, conversant Respiratory: Breathing even and unlabored. No stridor or wheezing Abdomen: Soft, nontender, nondistended to palpation. Incisions well-healed. No incisional hernias. Port easily palpated. Extremities: Atraumatic, good range of motion.  ASSESMENT: 63 y.o.  female  s/p AP-Standard lap-band.   PLAN: The patient's port was accessed with a 20G Huber needle without difficulty. Clear fluid was aspirated and 0.5 mL saline was added to the port. The patient was able to swallow water without difficulty following the procedure and was instructed to take clear liquids for the next 24-48 hours and advance slowly as tolerated.

## 2014-04-26 ENCOUNTER — Encounter (INDEPENDENT_AMBULATORY_CARE_PROVIDER_SITE_OTHER): Payer: Commercial Managed Care - PPO

## 2014-05-21 ENCOUNTER — Ambulatory Visit (INDEPENDENT_AMBULATORY_CARE_PROVIDER_SITE_OTHER): Payer: 59 | Admitting: *Deleted

## 2014-05-21 DIAGNOSIS — I5022 Chronic systolic (congestive) heart failure: Secondary | ICD-10-CM

## 2014-05-21 DIAGNOSIS — I428 Other cardiomyopathies: Secondary | ICD-10-CM

## 2014-05-21 DIAGNOSIS — I509 Heart failure, unspecified: Secondary | ICD-10-CM

## 2014-05-21 NOTE — Progress Notes (Signed)
Remote ICD transmission.   

## 2014-05-22 LAB — MDC_IDC_ENUM_SESS_TYPE_REMOTE
Battery Remaining Longevity: 95 mo
Battery Voltage: 3.16 V
Brady Statistic RV Percent Paced: 1 %
HIGH POWER IMPEDANCE MEASURED VALUE: 70 Ohm
HighPow Impedance: 70 Ohm
Implantable Pulse Generator Serial Number: 7136396
Lead Channel Impedance Value: 480 Ohm
Lead Channel Pacing Threshold Amplitude: 0.75 V
Lead Channel Sensing Intrinsic Amplitude: 12 mV
Lead Channel Setting Pacing Amplitude: 2.5 V
Lead Channel Setting Pacing Pulse Width: 0.5 ms
Lead Channel Setting Sensing Sensitivity: 0.5 mV
MDC IDC MSMT BATTERY REMAINING PERCENTAGE: 92 %
MDC IDC MSMT LEADCHNL RV PACING THRESHOLD PULSEWIDTH: 0.5 ms
MDC IDC SESS DTM: 20150914060019
Zone Setting Detection Interval: 250 ms
Zone Setting Detection Interval: 320 ms

## 2014-05-29 ENCOUNTER — Encounter: Payer: Self-pay | Admitting: Cardiology

## 2014-05-30 ENCOUNTER — Encounter: Payer: Self-pay | Admitting: Internal Medicine

## 2014-07-02 ENCOUNTER — Ambulatory Visit (INDEPENDENT_AMBULATORY_CARE_PROVIDER_SITE_OTHER): Payer: 59 | Admitting: Cardiovascular Disease

## 2014-07-02 ENCOUNTER — Encounter: Payer: Self-pay | Admitting: Cardiovascular Disease

## 2014-07-02 VITALS — BP 120/90 | HR 85 | Ht 64.0 in | Wt 209.4 lb

## 2014-07-02 DIAGNOSIS — I10 Essential (primary) hypertension: Secondary | ICD-10-CM

## 2014-07-02 DIAGNOSIS — I429 Cardiomyopathy, unspecified: Secondary | ICD-10-CM

## 2014-07-02 DIAGNOSIS — I5022 Chronic systolic (congestive) heart failure: Secondary | ICD-10-CM

## 2014-07-02 DIAGNOSIS — I428 Other cardiomyopathies: Secondary | ICD-10-CM

## 2014-07-02 MED ORDER — LOSARTAN POTASSIUM 50 MG PO TABS: 50.0000 mg | ORAL_TABLET | Freq: Every day | ORAL | Status: DC

## 2014-07-02 MED ORDER — ATORVASTATIN CALCIUM 10 MG PO TABS: 10.0000 mg | ORAL_TABLET | Freq: Every day | ORAL | Status: DC

## 2014-07-02 MED ORDER — FUROSEMIDE 40 MG PO TABS: 40.0000 mg | ORAL_TABLET | Freq: Every morning | ORAL | Status: DC

## 2014-07-02 MED ORDER — ISOSORB DINITRATE-HYDRALAZINE 20-37.5 MG PO TABS: ORAL_TABLET | ORAL | Status: DC

## 2014-07-02 NOTE — Assessment & Plan Note (Signed)
BP has been stable Continue current meds

## 2014-07-02 NOTE — Patient Instructions (Signed)
Your physician has recommended you make the following change in your medication:  DECREASE Bidil to 1/2 tablet three times daily Call Eligha Bridegroom, RN [phone]) to report if BP does not improve   Your physician recommends that you schedule a follow-up appointment in: 3 months with Dr. Elease Hashimoto.

## 2014-07-02 NOTE — Progress Notes (Signed)
[address] [place], [place]  [address] Phone: [phone] Fax:  [phone]  Date:  07/02/2014   ID:  Lori, Beard Oct 19, 1950, MRN [id number]  PCP:  Lupe Carney, MD  Cardiologist:  Dr. Delane Ginger   Electrophysiologist:  Dr. [name]     Problem List 1. Chronic systolic congestive heart failure 2. Hypertension 3.obesty - s/p lap band surgery 4. coronary artery disease-status post non-ST segment elevation myocardial infarction complicated by ventricular tachycardia 5. Status post ICD placement 08/04/13   History of Present Illness: Notes from [place] weaver - 09/04/14:  Lori Beard is a 62 y.o. female with a hx of HTN, obesity s/p LAP-BAND surgery and sleep apnea. She was admitted 07/2013 with pneumonia. She ruled in for a NSTEMI. Her hospitalization was complicated by monomorphic VTach that progressed to VFib due to R on T PVC in the setting of hypokalemia with a K of 2.9.  Echocardiogram (08/01/13): Mild LVH, global HK, EF 40-45%, Gr 1 DD, MAC, mild RVE, mildly reduced RVSF, PASP 31-35.  LHC (08/02/13):  Normal coronary arteries, EF 35%, LVEDP 17.  She was seen by EP and underwent ICD implantation for secondary prevention of malignant ventricular arrhythmias in the setting of nonischemic cardiomyopathy. She underwent St. [name] single-chamber defibrillator implantation by Dr. [name] 08/04/13.  I saw her 08/11/13.  She had resumed verapamil and metoprolol due to high blood pressures. I stopped these medications and placed on losartan and increased her BiDil to 3x per day.    Doing well since last seen.  The patient denies chest pain, shortness of breath, syncope, orthopnea, PND or significant pedal edema.  No ICD shocks.   Jan. 30, 2015:  Lori Beard presents today for followup evaluation of her hypertension and chronic systolic congestive heart failure. Her left ventricular ejection fraction is approximate 35%. She has a history of a cardiac arrest and  has had an ICD placed. She sees Dr. [name] for management of that.  12/27/2013:  Doing well  Oct. 26, 2015:  She is only taking the BiDil twice a day.  He causes profound weakness and dizziness.  She was originally written to take it 3 times a day but she's not able to take it twice a day.   . Even still, he causes her to be very weak and she has to  lie down after taking it for the next 30 minutes to one hour. Otherwise she's doing well. He's not having any profound shortness of breath. No chest pain  Recent Labs: 08/01/2013: ALT 11; Pro B Natriuretic peptide (BNP) 2445.0*  08/02/2013: HDL Cholesterol by NMR 14*; LDL (calc) 73  08/04/2013: Hemoglobin 10.7*  12/15/2013: Creatinine 1.9*; Potassium 3.9   Wt Readings from Last 3 Encounters:  07/02/14 209 lb 6.4 oz (94.983 kg)  04/19/14 216 lb 9.6 oz (98.249 kg)  02/07/14 209 lb 12.8 oz (95.165 kg)     Past Medical History  Diagnosis Date  . Hypertension   . Morbid obesity     a. s/p lapband surgery 11/2012.  [name] GERD (gastroesophageal reflux disease)   . Arthritis     ALL OVER  . Sleep apnea 02/15/2012    SLEEP STUDY IN EPIC - MILD OSA-CPAP NOT RECOMMENDED-HOME OXYGEN SUGGESTED BECAUSE OF OXYGEN DESATS ON ROOM AIR.  [name] Cardiac arrest 08/01/2013    Polymorphic VT  . NICM (nonischemic cardiomyopathy)     Echocardiogram (08/01/13): Mild LVH, global HK, EF 40-45%, Gr 1 DD, MAC, mild  RVE, mildly reduced RVSF, PASP 31-35.  LHC (08/02/13):  Normal coronary arteries, EF 35%, LVEDP 17.  . Chronic systolic CHF (congestive heart failure)   . Ventricular tachycardia     a. R on T PVC during admission for pneumonia/NSTEMI => monomorphic VT=>VF=>defib; s/p ICD  . S/P implantation of automatic cardioverter/defibrillator (AICD)     07/2013 [name]([place])  . Myocardial infarction 08/06/13    Current Outpatient Prescriptions  Medication Sig Dispense Refill  . acetaminophen (TYLENOL) 325 MG tablet Take 1-2 tablets (325-650 mg total) by mouth  every 4 (four) hours as needed for mild pain.      [name]. amitriptyline (ELAVIL) 25 MG tablet Take 75 mg by mouth at bedtime.      [name]. aspirin 81 MG tablet Take 1 tablet (81 mg total) by mouth every evening.      [name]. atorvastatin (LIPITOR) 10 MG tablet TAKE 1 TABLET BY MOUTH DAILY AT 6 PM.  30 tablet  6  . carvedilol (COREG) 25 MG tablet Take 1 tablet (25 mg total) by mouth 2 (two) times daily with a meal.  90 tablet  3  . furosemide (LASIX) 40 MG tablet Take 40 mg by mouth every morning.       [name]. HYDROcodone-acetaminophen (NORCO/VICODIN) 5-325 MG per tablet Take 2 tablets by mouth every 6 (six) hours as needed for pain.  20 tablet  0  . isosorbide-hydrALAZINE (BIDIL) 20-37.5 MG per tablet Take 1 tablet by mouth 2 (two) times daily.      [name]. losartan (COZAAR) 50 MG tablet Take 1 tablet (50 mg total) by mouth daily.  90 tablet  3  . Multiple Vitamin (MULTIVITAMIN) tablet Take 1 tablet by mouth daily.      . pantoprazole (PROTONIX) 40 MG tablet Take 40 mg by mouth every morning.       . potassium chloride SA (K-DUR,KLOR-CON) 20 MEQ tablet Take 2 tablets (40 mEq total) by mouth daily.  180 tablet  3   No current facility-administered medications for this visit.    Allergies:   Ace inhibitors; Iron; and Morphine and related   Social History:  The patient  reports that she has never smoked. She has never used smokeless tobacco. She reports that she does not drink alcohol or use illicit drugs.   Family History:  The patient's family history includes Cancer in her father and sister; Heart disease in her father; Kidney disease in her mother.   ROS:  Please see the history of present illness.    All other systems reviewed and negative.   PHYSICAL EXAM: VS:  BP 120/90  Pulse 85  Ht 5\' 4"  (1.626 m)  Wt 209 lb 6.4 oz (94.983 kg)  BMI 35.93 kg/m2 Well nourished, well developed, in no acute distress HEENT: normal Neck: no JVD Cardiac:  normal S1, S2; RRR; no murmur Lungs:  clear to auscultation bilaterally,  no wheezing, rhonchi or rales Abd: soft, nontender, no hepatomegaly Ext: no edema  Skin: warm and dry Neuro:  CNs 2-12 intact, no focal abnormalities noted  EKG:      07/02/2014: Normal sinus rhythm at 85. EKG is normal.   ASSESSMENT AND PLAN:

## 2014-07-02 NOTE — Assessment & Plan Note (Addendum)
Lori Beard  seems to be doing fairly well. [name] not tolerating the Bidil  very well. She becomes very hypotensive and has to lie down after taking the medication.  [name] tried reducing the dosing interval from 3 times a day to 2 times a day which seems to help but she still has to lie down after taking the medication.  We'll try reducing the dose to one half tablet 3 times a day. Hopefully she'll tolerate this dosing schedule better.  Her last echocardiogram was in November, 2014. Her LVEF was mildly depressed with an EF of 40-45%.

## 2014-07-03 ENCOUNTER — Other Ambulatory Visit: Payer: Self-pay | Admitting: Cardiovascular Disease

## 2014-07-19 ENCOUNTER — Encounter (INDEPENDENT_AMBULATORY_CARE_PROVIDER_SITE_OTHER): Payer: Commercial Managed Care - PPO

## 2014-07-19 DIAGNOSIS — Z6841 Body Mass Index (BMI) 40.0 and over, adult: Secondary | ICD-10-CM | POA: Diagnosis not present

## 2014-07-19 DIAGNOSIS — Z9884 Bariatric surgery status: Secondary | ICD-10-CM | POA: Diagnosis not present

## 2014-07-20 ENCOUNTER — Ambulatory Visit (INDEPENDENT_AMBULATORY_CARE_PROVIDER_SITE_OTHER): Payer: 59 | Admitting: Internal Medicine

## 2014-07-20 ENCOUNTER — Encounter: Payer: Self-pay | Admitting: Internal Medicine

## 2014-07-20 VITALS — BP 142/80 | HR 73 | Ht 64.0 in | Wt 213.0 lb

## 2014-07-20 DIAGNOSIS — Z95810 Presence of automatic (implantable) cardiac defibrillator: Secondary | ICD-10-CM

## 2014-07-20 DIAGNOSIS — I5022 Chronic systolic (congestive) heart failure: Secondary | ICD-10-CM | POA: Diagnosis not present

## 2014-07-20 DIAGNOSIS — I472 Ventricular tachycardia, unspecified: Secondary | ICD-10-CM

## 2014-07-20 LAB — MDC_IDC_ENUM_SESS_TYPE_INCLINIC
Battery Remaining Longevity: 93.6 mo
Brady Statistic RV Percent Paced: 0 %
Date Time Interrogation Session: 20151113111703
HighPow Impedance: 65.25 Ohm
Lead Channel Impedance Value: 437.5 Ohm
Lead Channel Pacing Threshold Pulse Width: 0.5 ms
Lead Channel Pacing Threshold Pulse Width: 0.5 ms
Lead Channel Setting Pacing Amplitude: 2.5 V
MDC IDC MSMT LEADCHNL RV PACING THRESHOLD AMPLITUDE: 0.75 V
MDC IDC MSMT LEADCHNL RV PACING THRESHOLD AMPLITUDE: 0.75 V
MDC IDC MSMT LEADCHNL RV SENSING INTR AMPL: 12 mV
MDC IDC PG SERIAL: 7136396
MDC IDC SET LEADCHNL RV PACING PULSEWIDTH: 0.5 ms
MDC IDC SET LEADCHNL RV SENSING SENSITIVITY: 0.5 mV
Zone Setting Detection Interval: 250 ms
Zone Setting Detection Interval: 320 ms

## 2014-07-20 NOTE — Assessment & Plan Note (Signed)
Her symptoms are class II. No change in medical therapy. She is encouraged to reduce her sodium intake, and increase her physical activity.

## 2014-07-20 NOTE — Assessment & Plan Note (Signed)
She has had no recurrent ventricular arrhythmias. No ICD shocks. No change in medications.

## 2014-07-20 NOTE — Assessment & Plan Note (Signed)
Her St. [name] single-chamber ICD is working normally. She has approximately 8 years of battery longevity.

## 2014-07-20 NOTE — Patient Instructions (Signed)
Your physician wants you to follow-up in: 12 months with Dr. Court Joy will receive a reminder letter in the mail two months in advance. If you don't receive a letter, please call our office to schedule the follow-up appointment.   Remote monitoring is used to monitor your Pacemaker or ICD from home. This monitoring reduces the number of office visits required to check your device to one time per year. It allows [place] to keep an eye on the functioning of your device to ensure it is working properly. You are scheduled for a device check from home on 10/22/14. You may send your transmission at any time that day. If you have a wireless device, the transmission will be sent automatically. After your physician reviews your transmission, you will receive a postcard with your next transmission date.

## 2014-07-20 NOTE — Progress Notes (Signed)
HPI Lori Beard returns today for followup. She is a pleasant 63yo woman with non-ischemic CM, chronic systolic heart failure, VF arrest and HTN. She has done well in the interim. No chest pain or sob. No syncope or ICD shock. Allergies  Allergen Reactions  . Ace Inhibitors Cough  . Iron Other (See Comments)    Gives patient migraine headaches when supplemented  . Morphine And Related Itching and Rash     Current Outpatient Prescriptions  Medication Sig Dispense Refill  . acetaminophen (TYLENOL) 325 MG tablet Take 1-2 tablets (325-650 mg total) by mouth every 4 (four) hours as needed for mild pain.    [name] amitriptyline (ELAVIL) 25 MG tablet Take 75 mg by mouth at bedtime.    [name] aspirin 81 MG tablet Take 1 tablet (81 mg total) by mouth every evening.    [name] atorvastatin (LIPITOR) 10 MG tablet Take 1 tablet (10 mg total) by mouth daily at 6 PM. 90 tablet 3  . carvedilol (COREG) 12.5 MG tablet TAKE 2 TABLETS BY MOUTH 2 TIMES DAILY WITH A MEAL 180 tablet 0  . furosemide (LASIX) 40 MG tablet Take 1 tablet (40 mg total) by mouth every morning. 90 tablet 3  . HYDROcodone-acetaminophen (NORCO/VICODIN) 5-325 MG per tablet Take 2 tablets by mouth every 6 (six) hours as needed for pain. 20 tablet 0  . isosorbide-hydrALAZINE (BIDIL) 20-37.5 MG per tablet Take 1/2 tablet 3 times daily (Patient taking differently: Take 1 tablet by mouth 2 times daily) 45 tablet 11  . losartan (COZAAR) 50 MG tablet Take 1 tablet (50 mg total) by mouth daily. 90 tablet 3  . Multiple Vitamin (MULTIVITAMIN) tablet Take 1 tablet by mouth daily.    . pantoprazole (PROTONIX) 40 MG tablet Take 40 mg by mouth every morning.     . potassium chloride SA (K-DUR,KLOR-CON) 20 MEQ tablet Take 2 tablets (40 mEq total) by mouth daily. 180 tablet 3   No current facility-administered medications for this visit.     Past Medical History  Diagnosis Date  . Hypertension   . Morbid obesity     a. s/p lapband surgery 11/2012.  [name]  GERD (gastroesophageal reflux disease)   . Arthritis     ALL OVER  . Sleep apnea 02/15/2012    SLEEP STUDY IN EPIC - MILD OSA-CPAP NOT RECOMMENDED-HOME OXYGEN SUGGESTED BECAUSE OF OXYGEN DESATS ON ROOM AIR.  [name] Cardiac arrest 08/01/2013    Polymorphic VT  . NICM (nonischemic cardiomyopathy)     Echocardiogram (08/01/13): Mild LVH, global HK, EF 40-45%, Gr 1 DD, MAC, mild RVE, mildly reduced RVSF, PASP 31-35.  LHC (08/02/13):  Normal coronary arteries, EF 35%, LVEDP 17.  . Chronic systolic CHF (congestive heart failure)   . Ventricular tachycardia     a. R on T PVC during admission for pneumonia/NSTEMI => monomorphic VT=>VF=>defib; s/p ICD  . S/P implantation of automatic cardioverter/defibrillator (AICD)     07/2013 [name])  . Myocardial infarction 08/06/13    ROS:   All systems reviewed and negative except as noted in the HPI.   Past Surgical History  Procedure Laterality Date  . Abdominal hysterectomy    . Knee arthroscopy    . Cholecystectomy    . Breath tek h pylori  02/15/2012    Procedure: BREATH TEK H PYLORI;  Surgeon: Valarie Merino, MD;  Location: [facility];  Service: General;  Laterality: N/A;  745  . Joint replacement      BILATERAL  TOTAL HIP REPLACEMENTS  . Mesh applied to lap port N/A 11/22/2012    Procedure: MESH APPLIED TO LAP PORT;  Surgeon: Valarie MerinoMatthew B Martin, MD;  Location: [facility];  Service: General;  Laterality: N/A;  . Laparoscopic gastric banding    . Pacemaker insertion   08/04/2013    St. [name] single chamber ICD  . Cardiac catheterization  08/02/2013    Normal coronary arteries, EF 35%      Family History  Problem Relation Age of Onset  . Kidney disease Mother   . Heart disease Father   . Cancer Father     colon  . Cancer Sister     breast     History   Social History  . Marital Status: Married    Spouse Name: N/A    Number of Children: N/A  . Years of Education: N/A   Occupational History  . Not on file.   Social History Main  Topics  . Smoking status: Never Smoker   . Smokeless tobacco: Never Used  . Alcohol Use: No  . Drug Use: No  . Sexual Activity: Not Currently   Other Topics Concern  . Not on file   Social History Narrative     BP 142/80 mmHg  Pulse 73  Ht 5\' 4"  (1.626 m)  Wt 213 lb (96.616 kg)  BMI 36.54 kg/m2  Physical Exam:  Well appearing 63 yo woman, NAD HEENT: Unremarkable Neck:  No JVD, no thyromegally Back:  No CVA tenderness Lungs:  Clear with no wheezes, well healed ICD incision. HEART:  Regular rate rhythm, no murmurs, no rubs, no clicks Abd:  soft, positive bowel sounds, no organomegally, no rebound, no guarding Ext:  2 plus pulses, no edema, no cyanosis, no clubbing Skin:  No rashes no nodules Neuro:  CN II through XII intact, motor grossly intact   DEVICE  Normal device function.  See PaceArt for details.    Assess/Plan:

## 2014-07-22 ENCOUNTER — Encounter: Payer: Self-pay | Admitting: Internal Medicine

## 2014-08-16 ENCOUNTER — Encounter (HOSPITAL_COMMUNITY): Payer: Self-pay | Admitting: Cardiovascular Disease

## 2014-09-06 ENCOUNTER — Other Ambulatory Visit: Payer: Self-pay | Admitting: Cardiovascular Disease

## 2014-09-14 ENCOUNTER — Other Ambulatory Visit: Payer: Self-pay | Admitting: Nurse Practitioner

## 2014-09-14 MED ORDER — HYDRALAZINE HCL 25 MG PO TABS: 25.0000 mg | ORAL_TABLET | Freq: Three times a day (TID) | ORAL | Status: DC

## 2014-09-14 MED ORDER — ISOSORBIDE DINITRATE 20 MG PO TABS: 20.0000 mg | ORAL_TABLET | Freq: Three times a day (TID) | ORAL | Status: DC

## 2014-10-02 ENCOUNTER — Encounter: Payer: Self-pay | Admitting: Cardiovascular Disease

## 2014-10-02 ENCOUNTER — Ambulatory Visit (INDEPENDENT_AMBULATORY_CARE_PROVIDER_SITE_OTHER): Payer: 59 | Admitting: Cardiovascular Disease

## 2014-10-02 VITALS — BP 125/90 | HR 88 | Ht 64.0 in | Wt 211.0 lb

## 2014-10-02 DIAGNOSIS — I429 Cardiomyopathy, unspecified: Secondary | ICD-10-CM

## 2014-10-02 DIAGNOSIS — I428 Other cardiomyopathies: Secondary | ICD-10-CM

## 2014-10-02 DIAGNOSIS — I5022 Chronic systolic (congestive) heart failure: Secondary | ICD-10-CM

## 2014-10-02 DIAGNOSIS — E669 Obesity, unspecified: Secondary | ICD-10-CM

## 2014-10-02 DIAGNOSIS — I10 Essential (primary) hypertension: Secondary | ICD-10-CM | POA: Diagnosis not present

## 2014-10-02 NOTE — Progress Notes (Signed)
Cardiology Office Note   Date:  10/02/2014   ID:  [name], [name] 1950-12-18, MRN [id number]  PCP:  Lupe Carney, MD  Cardiologist:   Vesta Mixer, MD   Chief Complaint  Patient presents with  . Hypertension    follow up      History of Present Illness: Lori Beard is a 64 y.o. female who presents for   1. Chronic systolic congestive heart failure 2. Hypertension 3 .obesty - s/p lap band surgery 4. coronary artery disease-status post non-ST segment elevation myocardial infarction complicated by ventricular tachycardia 5. Status post ICD placement 08/04/13   History of Present Illness: Notes from [name] - 09/04/14:  Lori Beard is a 64 y.o. female with a hx of HTN, obesity s/p LAP-BAND surgery and sleep apnea. She was admitted 07/2013 with pneumonia. She ruled in for a NSTEMI. Her hospitalization was complicated by monomorphic VTach that progressed to VFib due to R on T PVC in the setting of hypokalemia with a K of 2.9. Echocardiogram (08/01/13): Mild LVH, global HK, EF 40-45%, Gr 1 DD, MAC, mild RVE, mildly reduced RVSF, PASP 31-35. LHC (08/02/13): Normal coronary arteries, EF 35%, LVEDP 17. She was seen by EP and underwent ICD implantation for secondary prevention of malignant ventricular arrhythmias in the setting of nonischemic cardiomyopathy. She underwent St. [name] single-chamber defibrillator implantation by Dr. [name] 08/04/13.  I saw her 08/11/13. She had resumed verapamil and metoprolol due to high blood pressures. I stopped these medications and placed on losartan and increased her BiDil to 3x per day.   Doing well since last seen. The patient denies chest pain, shortness of breath, syncope, orthopnea, PND or significant pedal edema. No ICD shocks.   Jan. 30, 2015:  Lori Beard presents today for followup evaluation of her hypertension and chronic systolic congestive heart failure. Her left ventricular ejection fraction is approximate  35%. She has a history of a cardiac arrest and has had an ICD placed. She sees Dr. [name] for management of that.  12/27/2013:  Doing well  Oct. 26, 2015:  She is only taking the BiDil twice a day. He causes profound weakness and dizziness. She was originally written to take it 3 times a day but she's not able to take it twice a day.   . Even still, he causes her to be very weak and she has to lie down after taking it for the next 30 minutes to one hour. Otherwise she's doing well. He's not having any profound shortness of breath. No chest pain  Jan. 26, 2016:  Doing well.  No cp or dyspnea.   Is on hydralazine and ISDN instead of BiDIl now.     Past Medical History  Diagnosis Date  . Hypertension   . Morbid obesity     a. s/p lapband surgery 11/2012.  [name] GERD (gastroesophageal reflux disease)   . Arthritis     ALL OVER  . Sleep apnea 02/15/2012    SLEEP STUDY IN EPIC - MILD OSA-CPAP NOT RECOMMENDED-HOME OXYGEN SUGGESTED BECAUSE OF OXYGEN DESATS ON ROOM AIR.  [name] Cardiac arrest 08/01/2013    Polymorphic VT  . NICM (nonischemic cardiomyopathy)     Echocardiogram (08/01/13): Mild LVH, global HK, EF 40-45%, Gr 1 DD, MAC, mild RVE, mildly reduced RVSF, PASP 31-35.  LHC (08/02/13):  Normal coronary arteries, EF 35%, LVEDP 17.  . Chronic systolic CHF (congestive heart failure)   . Ventricular tachycardia     a. R  on T PVC during admission for pneumonia/NSTEMI => monomorphic VT=>VF=>defib; s/p ICD  . S/P implantation of automatic cardioverter/defibrillator (AICD)     07/2013 [name])  . Myocardial infarction 08/06/13    Past Surgical History  Procedure Laterality Date  . Abdominal hysterectomy    . Knee arthroscopy    . Cholecystectomy    . Breath tek h pylori  02/15/2012    Procedure: BREATH TEK H PYLORI;  Surgeon: Valarie Merino, MD;  Location: [facility];  Service: General;  Laterality: N/A;  745  . Joint replacement      BILATERAL TOTAL HIP REPLACEMENTS  . Mesh  applied to lap port N/A 11/22/2012    Procedure: MESH APPLIED TO LAP PORT;  Surgeon: Valarie Merino, MD;  Location: [facility];  Service: General;  Laterality: N/A;  . Laparoscopic gastric banding    . Pacemaker insertion   08/04/2013    St. [name] single chamber ICD  . Cardiac catheterization  08/02/2013    Normal coronary arteries, EF 35%   . Left heart catheterization with coronary angiogram N/A 08/02/2013    Procedure: LEFT HEART CATHETERIZATION WITH CORONARY ANGIOGRAM;  Surgeon: Iran Ouch, MD;  Location: [facility];  Service: Cardiovascular;  Laterality: N/A;  . Implantable cardioverter defibrillator implant N/A 08/04/2013    Procedure: IMPLANTABLE CARDIOVERTER DEFIBRILLATOR IMPLANT;  Surgeon: Marinus Maw, MD;  [facility];  Service: Cardiovascular;  Laterality: N/A;     Current Outpatient Prescriptions  Medication Sig Dispense Refill  . acetaminophen (TYLENOL) 325 MG tablet Take 1-2 tablets (325-650 mg total) by mouth every 4 (four) hours as needed for mild pain.    [name] amitriptyline (ELAVIL) 25 MG tablet Take 75 mg by mouth at bedtime.    [name] aspirin 81 MG tablet Take 1 tablet (81 mg total) by mouth every evening.    [name] atorvastatin (LIPITOR) 10 MG tablet Take 1 tablet (10 mg total) by mouth daily at 6 PM. 90 tablet 3  . carvedilol (COREG) 12.5 MG tablet TAKE 2 TABLETS BY MOUTH 2 TIMES DAILY WITH A MEAL 180 tablet 5  . furosemide (LASIX) 40 MG tablet Take 1 tablet (40 mg total) by mouth every morning. 90 tablet 3  . hydrALAZINE (APRESOLINE) 25 MG tablet Take 1 tablet (25 mg total) by mouth 3 (three) times daily. 270 tablet 3  . HYDROcodone-acetaminophen (NORCO/VICODIN) 5-325 MG per tablet Take 2 tablets by mouth every 6 (six) hours as needed for pain. 20 tablet 0  . isosorbide dinitrate (ISORDIL) 20 MG tablet Take 1 tablet (20 mg total) by mouth 3 (three) times daily. 270 tablet 3  . losartan (COZAAR) 50 MG tablet Take 1 tablet (50 mg total) by mouth daily. 90 tablet 3  .  Multiple Vitamin (MULTIVITAMIN) tablet Take 1 tablet by mouth daily.    . pantoprazole (PROTONIX) 40 MG tablet Take 40 mg by mouth every morning.     . potassium chloride SA (K-DUR,KLOR-CON) 20 MEQ tablet Take 2 tablets (40 mEq total) by mouth daily. 180 tablet 3   No current facility-administered medications for this visit.    Allergies:   Ace inhibitors; Iron; and Morphine and related    Social History:  The patient  reports that she has never smoked. She has never used smokeless tobacco. She reports that she does not drink alcohol or use illicit drugs.   Family History:  The patient's family history includes Cancer in her father and sister; Heart disease in her father; Hypertension  in her father and paternal grandmother; Kidney disease in her mother; Stroke in her paternal grandmother. There is no history of Heart attack.    ROS:  Please see the history of present illness.   Otherwise, review of systems are positive for none.   All other systems are reviewed and negative.    PHYSICAL EXAM: VS:  BP 125/90 mmHg  Pulse 88  Ht  (1.626 m)  Wt 211 lb (95.709 kg)  BMI 36.20 kg/m2 , BMI Body mass index is 36.2 kg/(m^2). GEN: Well nourished, well developed, in no acute distress HEENT: normal Neck: no JVD, carotid bruits, or masses Cardiac: RRR; no murmurs, rubs, or gallops,no edema  Respiratory:  clear to auscultation bilaterally, normal work of breathing GI: soft, nontender, nondistended, + BS MS: no deformity or atrophy Skin: warm and dry, no rash Neuro:  Strength and sensation are intact Psych: normal   EKG:  EKG is not ordered today.   Recent Labs: 12/15/2013: BUN 26*; Creatinine 1.9*; Potassium 3.9; Sodium 139    Lipid Panel    Component Value Date/Time   CHOL 111 08/02/2013 0617   TRIG 122 08/02/2013 0617   HDL 14* 08/02/2013 0617   CHOLHDL 7.9 08/02/2013 0617   VLDL 24 08/02/2013 0617   LDLCALC 73 08/02/2013 0617      Wt Readings from Last 3 Encounters:    10/02/14 211 lb (95.709 kg)  07/20/14 213 lb (96.616 kg)  07/02/14 209 lb 6.4 oz (94.983 kg)      Other studies Reviewed: Additional studies/ records that were reviewed today include: . Review of the above records demonstrates:    ASSESSMENT AND PLAN:  1. Chronic systolic congestive heart failure - she seems to be well compensated  2. Hypertension- BP is stable   3 .obesty - s/p lap band surgery - stable   4. coronary artery disease-status post non-ST segment elevation myocardial infarction complicated by ventricular tachycardia - no angina  5. Status post ICD placement 08/04/13 - - followed by Dr. [name]    Current medicines are reviewed at length with the patient today.  The patient does not have concerns regarding medicines.  The following changes have been made:  no change  Labs/ tests ordered today include: BMP    No orders of the defined types were placed in this encounter.     Disposition:   FU with me in 6 months.     Signed, Nahser, Deloris Ping, MD  10/02/2014 3:10 PM    [facility] [address] [place], [place], [place]  [address] Phone: [phone]; Fax: [phone]

## 2014-10-02 NOTE — Patient Instructions (Signed)
Your physician recommends that you continue on your current medications as directed. Please refer to the Current Medication list given to you today.      Your physician recommends that you return for lab work in: TODAY Designer, jewellery)   Your physician recommends that you return for lab work in: 6 MONTHS  (BMET, LFTs, LIPIDS) THIS WILL BE SAME DAY  AS YOUR FOLLOW-UP APPOINTMENT WITH DR NAHSER---PLEASE COME FASTING TO THIS APPOINTMENT   Your physician wants you to follow-up in: 6 MONTHS WITH DR Elease Hashimoto You will receive a reminder letter in the mail two months in advance. If you don't receive a letter, please call our office to schedule the follow-up appointment.

## 2014-10-03 LAB — BASIC METABOLIC PANEL
BUN: 10 mg/dL (ref 6–23)
CO2: 33 mEq/L — ABNORMAL HIGH (ref 19–32)
Calcium: 9.2 mg/dL (ref 8.4–10.5)
Chloride: 103 mEq/L (ref 96–112)
Creatinine, Ser: 1.13 mg/dL (ref 0.40–1.20)
GFR: 62.3 mL/min (ref >60.00)
Glucose, Bld: 104 mg/dL — ABNORMAL HIGH (ref 70–99)
Potassium: 3.6 mEq/L (ref 3.5–5.1)
SODIUM: 140 meq/L (ref 135–145)

## 2014-10-18 DIAGNOSIS — Z9884 Bariatric surgery status: Secondary | ICD-10-CM | POA: Diagnosis not present

## 2014-10-18 DIAGNOSIS — Z6841 Body Mass Index (BMI) 40.0 and over, adult: Secondary | ICD-10-CM | POA: Diagnosis not present

## 2014-10-22 ENCOUNTER — Encounter: Payer: Self-pay | Admitting: Internal Medicine

## 2014-10-22 ENCOUNTER — Ambulatory Visit (INDEPENDENT_AMBULATORY_CARE_PROVIDER_SITE_OTHER): Payer: 59 | Admitting: *Deleted

## 2014-10-22 DIAGNOSIS — I428 Other cardiomyopathies: Secondary | ICD-10-CM

## 2014-10-22 DIAGNOSIS — I5022 Chronic systolic (congestive) heart failure: Secondary | ICD-10-CM

## 2014-10-22 DIAGNOSIS — I429 Cardiomyopathy, unspecified: Secondary | ICD-10-CM

## 2014-10-22 LAB — MDC_IDC_ENUM_SESS_TYPE_REMOTE
Battery Remaining Longevity: 91 mo
Battery Remaining Percentage: 88 %
Brady Statistic RV Percent Paced: 1 %
Date Time Interrogation Session: 20160215070018
HighPow Impedance: 68 Ohm
HighPow Impedance: 68 Ohm
Implantable Pulse Generator Serial Number: 7136396
Lead Channel Impedance Value: 480 Ohm
Lead Channel Pacing Threshold Pulse Width: 0.5 ms
Lead Channel Setting Sensing Sensitivity: 0.5 mV
MDC IDC MSMT BATTERY VOLTAGE: 3.07 V
MDC IDC MSMT LEADCHNL RV PACING THRESHOLD AMPLITUDE: 0.75 V
MDC IDC MSMT LEADCHNL RV SENSING INTR AMPL: 11.9 mV
MDC IDC SET LEADCHNL RV PACING AMPLITUDE: 2.5 V
MDC IDC SET LEADCHNL RV PACING PULSEWIDTH: 0.5 ms
MDC IDC SET ZONE DETECTION INTERVAL: 250 ms
MDC IDC SET ZONE DETECTION INTERVAL: 320 ms

## 2014-10-22 NOTE — Progress Notes (Signed)
Remote ICD transmission.   

## 2014-10-25 ENCOUNTER — Encounter: Payer: Self-pay | Admitting: Cardiology

## 2015-01-21 ENCOUNTER — Ambulatory Visit (INDEPENDENT_AMBULATORY_CARE_PROVIDER_SITE_OTHER): Payer: 59 | Admitting: *Deleted

## 2015-01-21 ENCOUNTER — Encounter: Payer: Self-pay | Admitting: Internal Medicine

## 2015-01-21 DIAGNOSIS — I429 Cardiomyopathy, unspecified: Secondary | ICD-10-CM | POA: Diagnosis not present

## 2015-01-21 DIAGNOSIS — I428 Other cardiomyopathies: Secondary | ICD-10-CM

## 2015-01-21 DIAGNOSIS — I5022 Chronic systolic (congestive) heart failure: Secondary | ICD-10-CM | POA: Diagnosis not present

## 2015-01-21 NOTE — Progress Notes (Signed)
Remote ICD transmission.   

## 2015-01-24 LAB — CUP PACEART REMOTE DEVICE CHECK
Battery Remaining Longevity: 89 mo
Battery Remaining Percentage: 87 %
HIGH POWER IMPEDANCE MEASURED VALUE: 79 Ohm
HighPow Impedance: 65 Ohm
Lead Channel Impedance Value: 450 Ohm
Lead Channel Pacing Threshold Pulse Width: 0.5 ms
Lead Channel Setting Pacing Amplitude: 2.5 V
Lead Channel Setting Pacing Pulse Width: 0.5 ms
Lead Channel Setting Sensing Sensitivity: 0.5 mV
MDC IDC MSMT BATTERY VOLTAGE: 3.04 V
MDC IDC MSMT LEADCHNL RV PACING THRESHOLD AMPLITUDE: 0.75 V
MDC IDC MSMT LEADCHNL RV SENSING INTR AMPL: 11.1 mV
MDC IDC SESS DTM: 20160516222823
MDC IDC SET ZONE DETECTION INTERVAL: 250 ms
MDC IDC SET ZONE DETECTION INTERVAL: 320 ms
MDC IDC STAT BRADY RV PERCENT PACED: 1 %
Pulse Gen Serial Number: 7136396

## 2015-02-06 ENCOUNTER — Encounter: Payer: Self-pay | Admitting: Cardiology

## 2015-04-02 ENCOUNTER — Encounter: Payer: 59 | Attending: General Surgery | Admitting: Dietician

## 2015-04-02 ENCOUNTER — Encounter: Payer: Self-pay | Admitting: Dietician

## 2015-04-02 VITALS — Ht 63.5 in | Wt 216.1 lb

## 2015-04-02 DIAGNOSIS — Z6837 Body mass index (BMI) 37.0-37.9, adult: Secondary | ICD-10-CM | POA: Diagnosis not present

## 2015-04-02 DIAGNOSIS — E669 Obesity, unspecified: Secondary | ICD-10-CM | POA: Insufficient documentation

## 2015-04-02 DIAGNOSIS — Z713 Dietary counseling and surveillance: Secondary | ICD-10-CM | POA: Diagnosis not present

## 2015-04-02 DIAGNOSIS — Z9884 Bariatric surgery status: Secondary | ICD-10-CM | POA: Insufficient documentation

## 2015-04-02 NOTE — Patient Instructions (Addendum)
-  Shift focus to lean meat + nonstarchy vegetables -Work on eating a protein food or protein shake at least 3x a day  -Premier protein shake, eggs, cheese, fish, ground beef, beans -Add these foods to the grocery list -Try PB2 for peanut butter flavor -Start drinking protein shakes when you wake up in the morning -Keep working on chewing thoroughly, eating slowly, and taking tiny bites -Avoid sweet drinks -Start walking 3 mornings a week

## 2015-04-02 NOTE — Progress Notes (Signed)
  Medical Nutrition Therapy:  Appt start time: 1115 end time:  1145.   Assessment:  Primary concerns today: Oyuky was referred here from [facility]. She had fluid removed at last visit and has a visit for a fill this week. She had been experiencing weight gain and burping and some regurgitation. She reports a normal weight of 212 lbs as she was "not able to go any lower." Cannot tolerate most meat or salads. Amanat eats mainly soft foods like potatoes, creamed spinach, fruit, and sweets. She reports that her burping is resolved with sweets. Arrabella also stats that she sometimes may not eat anything all day. Has some vomiting with foods that are not soft. She still avoids drinking while eating and is still taking vitamins and calcium. Deovion lives with her husband. He does the grocery shopping.  Preferred Learning Style:  No preference indicated   Learning Readiness:   Ready   MEDICATIONS: see list   DIETARY INTAKE:  Usual eating pattern includes 0-2 meals per day.  24-hr recall:   Wakes up at 6:30 am  B ( AM): coffee  Snk ( AM):   L (2 PM): baked potato with cheese and margarine Snk ( PM):  D (10 PM): baked potato with cheese and margarine Snk ( PM):   Beverages: sweet tea, coffee with cream and sugar, soda occasionally, water  Usual physical activity: none  Estimated energy needs: [phone] calories  Progress Towards Goal(s):  In progress.   Nutritional Diagnosis:  [place]-3.4 Unintentional weight gain As related to increased carbohydrate intake and erratic meal pattern.  As evidenced by patient reports recent 4-lb weight gain.    Intervention:  Nutrition counseling provided. Goals: -Shift focus to lean meat + nonstarchy vegetables -Work on eating a protein food or protein shake at least 3x a day  -Premier protein shake, eggs, cheese, fish, ground beef, beans -Add these foods to the grocery list -Try PB2 for peanut butter flavor -Start drinking protein shakes when you wake up in the  morning -Keep working on chewing thoroughly, eating slowly, and taking tiny bites -Avoid sweet drinks -Start walking 3 mornings a week  Samples provided and patient instructed on proper use: Premier protein shake (chocolate - qty 3) Lot#: [id number] Exp: 12/2015  Teaching Method Utilized:  Visual Auditory Hands on  Handouts given during visit include:  Phase 3A lean proteins  Barriers to learning/adherence to lifestyle change: burping and regurgitation with many foods  Demonstrated degree of understanding via:  Teach Back   Monitoring/Evaluation:  Dietary intake, exercise, and body weight in 6 week(s).

## 2015-04-04 ENCOUNTER — Other Ambulatory Visit: Payer: Self-pay | Admitting: Physician Assistant

## 2015-04-22 ENCOUNTER — Ambulatory Visit (INDEPENDENT_AMBULATORY_CARE_PROVIDER_SITE_OTHER): Payer: 59 | Admitting: *Deleted

## 2015-04-22 DIAGNOSIS — I428 Other cardiomyopathies: Secondary | ICD-10-CM

## 2015-04-22 DIAGNOSIS — I5022 Chronic systolic (congestive) heart failure: Secondary | ICD-10-CM | POA: Diagnosis not present

## 2015-04-22 DIAGNOSIS — I429 Cardiomyopathy, unspecified: Secondary | ICD-10-CM

## 2015-04-22 NOTE — Progress Notes (Signed)
Remote ICD transmission.   

## 2015-04-30 LAB — CUP PACEART REMOTE DEVICE CHECK
Battery Remaining Percentage: 85 %
Battery Voltage: 3.01 V
Brady Statistic RV Percent Paced: 1 %
Date Time Interrogation Session: 20160815081001
HIGH POWER IMPEDANCE MEASURED VALUE: 70 Ohm
HighPow Impedance: 70 Ohm
Lead Channel Impedance Value: 440 Ohm
Lead Channel Pacing Threshold Pulse Width: 0.5 ms
Lead Channel Sensing Intrinsic Amplitude: 12 mV
Lead Channel Setting Pacing Amplitude: 2.5 V
MDC IDC MSMT BATTERY REMAINING LONGEVITY: 86 mo
MDC IDC MSMT LEADCHNL RV PACING THRESHOLD AMPLITUDE: 0.75 V
MDC IDC SET LEADCHNL RV PACING PULSEWIDTH: 0.5 ms
MDC IDC SET LEADCHNL RV SENSING SENSITIVITY: 0.5 mV
Pulse Gen Serial Number: 7136396
Zone Setting Detection Interval: 250 ms
Zone Setting Detection Interval: 320 ms

## 2015-05-08 ENCOUNTER — Encounter: Payer: Self-pay | Admitting: Cardiology

## 2015-05-14 ENCOUNTER — Encounter: Payer: Self-pay | Admitting: Internal Medicine

## 2015-05-14 ENCOUNTER — Ambulatory Visit: Payer: 59 | Admitting: Dietician

## 2015-06-11 ENCOUNTER — Other Ambulatory Visit: Payer: Self-pay | Admitting: Internal Medicine

## 2015-07-04 ENCOUNTER — Other Ambulatory Visit: Payer: Self-pay | Admitting: Cardiovascular Disease

## 2015-07-04 DIAGNOSIS — Z4651 Encounter for fitting and adjustment of gastric lap band: Secondary | ICD-10-CM | POA: Diagnosis not present

## 2015-07-10 ENCOUNTER — Other Ambulatory Visit: Payer: Self-pay | Admitting: Cardiovascular Disease

## 2015-07-23 ENCOUNTER — Encounter: Payer: Self-pay | Admitting: Internal Medicine

## 2015-07-23 ENCOUNTER — Ambulatory Visit (INDEPENDENT_AMBULATORY_CARE_PROVIDER_SITE_OTHER): Payer: 59 | Admitting: Internal Medicine

## 2015-07-23 VITALS — BP 122/88 | HR 77 | Ht 64.0 in | Wt 216.6 lb

## 2015-07-23 DIAGNOSIS — I5022 Chronic systolic (congestive) heart failure: Secondary | ICD-10-CM | POA: Diagnosis not present

## 2015-07-23 DIAGNOSIS — Z95810 Presence of automatic (implantable) cardiac defibrillator: Secondary | ICD-10-CM

## 2015-07-23 DIAGNOSIS — I429 Cardiomyopathy, unspecified: Secondary | ICD-10-CM | POA: Diagnosis not present

## 2015-07-23 LAB — CUP PACEART INCLINIC DEVICE CHECK
HIGH POWER IMPEDANCE MEASURED VALUE: 60.75 Ohm
Implantable Lead Implant Date: 20141128
Implantable Lead Location: 753860
Implantable Lead Model: 7122
Lead Channel Pacing Threshold Amplitude: 0.75 V
Lead Channel Pacing Threshold Pulse Width: 0.5 ms
MDC IDC MSMT BATTERY REMAINING LONGEVITY: 87.6
MDC IDC MSMT LEADCHNL RV IMPEDANCE VALUE: 412.5 Ohm
MDC IDC MSMT LEADCHNL RV SENSING INTR AMPL: 12 mV
MDC IDC SESS DTM: 20161115135443
MDC IDC SET LEADCHNL RV PACING AMPLITUDE: 2.5 V
MDC IDC SET LEADCHNL RV PACING PULSEWIDTH: 0.5 ms
MDC IDC SET LEADCHNL RV SENSING SENSITIVITY: 0.5 mV
MDC IDC STAT BRADY RV PERCENT PACED: 0 %
Pulse Gen Serial Number: 7136396

## 2015-07-23 NOTE — Patient Instructions (Signed)
Medication Instructions:  Your physician recommends that you continue on your current medications as directed. Please refer to the Current Medication list given to you today.  Labwork: None ordered  Testing/Procedures: None ordered  Follow-Up: Remote monitoring is used to monitor your Pacemaker of ICD from home. This monitoring reduces the number of office visits required to check your device to one time per year. It allows us to keep an eye on the functioning of your device to ensure it is working properly. You are scheduled for a device check from home on 10/22/2015. You may send your transmission at any time that day. If you have a wireless device, the transmission will be sent automatically. After your physician reviews your transmission, you will receive a postcard with your next transmission date.  Your physician wants you to follow-up in: 1 year with Dr. Taylor. You will receive a reminder letter in the mail two months in advance. If you don't receive a letter, please call our office to schedule the follow-up appointment.   If you need a refill on your cardiac medications before your next appointment, please call your pharmacy.  Thank you for choosing [facility]!!        

## 2015-07-23 NOTE — Assessment & Plan Note (Signed)
Her blood pressure is stable. She will continue a low sodium diet.

## 2015-07-23 NOTE — Assessment & Plan Note (Signed)
Her symptoms remain class 2. She will continue her current meds. Her fluid index has been well controlled.

## 2015-07-23 NOTE — Assessment & Plan Note (Signed)
Her medtronic ICD is working normally. Will recheck in several months. 

## 2015-07-23 NOTE — Progress Notes (Signed)
HPI [facility] returns today for followup. She is a pleasant 64 yo woman with non-ischemic CM, chronic systolic heart failure, VF arrest and HTN. She has done well in the interim. No chest pain or sob. No syncope or ICD shock. Allergies  Allergen Reactions  . Ace Inhibitors Cough  . Iron Other (See Comments)    Gives patient migraine headaches when supplemented  . Morphine And Related Itching and Rash     Current Outpatient Prescriptions  Medication Sig Dispense Refill  . acetaminophen (TYLENOL) 325 MG tablet Take 1-2 tablets (325-650 mg total) by mouth every 4 (four) hours as needed for mild pain.    [facility] (ELAVIL) 25 MG tablet Take 75 mg by mouth at bedtime.    Lori Kitchen aspirin 81 MG tablet Take 1 tablet (81 mg total) by mouth every evening.    Lori Kitchen atorvastatin (LIPITOR) 10 MG tablet Take 1 tablet (10 mg total) by mouth daily at 6 PM. 90 tablet 3  . carvedilol (COREG) 12.5 MG tablet TAKE 2 TABLETS BY MOUTH 2 TIMES DAILY WITH A MEAL 120 tablet 0  . furosemide (LASIX) 40 MG tablet TAKE 1 TABLET BY MOUTH EVERY MORNING. 90 tablet 0  . hydrALAZINE (APRESOLINE) 25 MG tablet Take 1 tablet (25 mg total) by mouth 3 (three) times daily. (Patient taking differently: Take 25 mg by mouth 2 (two) times daily. ) 270 tablet 3  . HYDROcodone-acetaminophen (NORCO/VICODIN) 5-325 MG per tablet Take 2 tablets by mouth every 6 (six) hours as needed for pain. 20 tablet 0  . isosorbide dinitrate (ISORDIL) 20 MG tablet Take 1 tablet (20 mg total) by mouth 3 (three) times daily. (Patient taking differently: Take 20 mg by mouth 2 (two) times daily. ) 270 tablet 3  . losartan (COZAAR) 50 MG tablet Take 1 tablet (50 mg total) by mouth daily. 90 tablet 3  . Multiple Vitamin (MULTIVITAMIN) tablet Take 1 tablet by mouth daily.    . pantoprazole (PROTONIX) 40 MG tablet Take 40 mg by mouth every morning.     . potassium chloride SA (K-DUR,KLOR-CON) 20 MEQ tablet TAKE 2 TABLETS BY MOUTH DAILY. 180 tablet 0    No current facility-administered medications for this visit.     Past Medical History  Diagnosis Date  . Hypertension   . Morbid obesity (HCC)     a. s/p lapband surgery 11/2012.  Lori Kitchen GERD (gastroesophageal reflux disease)   . Arthritis     ALL OVER  . Sleep apnea 02/15/2012    SLEEP STUDY IN EPIC - MILD OSA-CPAP NOT RECOMMENDED-HOME OXYGEN SUGGESTED BECAUSE OF OXYGEN DESATS ON ROOM AIR.  Lori Kitchen Cardiac arrest (HCC) 08/01/2013    Polymorphic VT  . NICM (nonischemic cardiomyopathy) (HCC)     Echocardiogram (08/01/13): Mild LVH, global HK, EF 40-45%, Gr 1 DD, MAC, mild RVE, mildly reduced RVSF, PASP 31-35.  LHC (08/02/13):  Normal coronary arteries, EF 35%, LVEDP 17.  . Chronic systolic CHF (congestive heart failure) (HCC)   . Ventricular tachycardia (HCC)     a. R on T PVC during admission for pneumonia/NSTEMI => monomorphic VT=>VF=>defib; s/p ICD  . S/P implantation of automatic cardioverter/defibrillator (AICD)     07/2013 [facility])  . Myocardial infarction (HCC) 08/06/13    ROS:   All systems reviewed and negative except as noted in the HPI.   Past Surgical History  Procedure Laterality Date  . Abdominal hysterectomy    . Knee arthroscopy    . Cholecystectomy    .  Breath tek h pylori  02/15/2012    Procedure: BREATH TEK H PYLORI;  Surgeon: Lori Merino, MD;  [facility];  Service: General;  Laterality: N/A;  745  . Joint replacement      BILATERAL TOTAL HIP REPLACEMENTS  . Mesh applied to lap port N/A 11/22/2012    Procedure: MESH APPLIED TO LAP PORT;  Surgeon: Lori Merino, MD;  Location: [facility];  Service: General;  Laterality: N/A;  . Laparoscopic gastric banding    . Pacemaker insertion   08/04/2013    St. [name] single chamber ICD  . Cardiac catheterization  08/02/2013    Normal coronary arteries, EF 35%   . Left heart catheterization with coronary angiogram N/A 08/02/2013    Procedure: LEFT HEART CATHETERIZATION WITH CORONARY ANGIOGRAM;  Surgeon:  Lori Ouch, MD;  Location: [facility];  Service: Cardiovascular;  Laterality: N/A;  . Implantable cardioverter defibrillator implant N/A 08/04/2013    Procedure: IMPLANTABLE CARDIOVERTER DEFIBRILLATOR IMPLANT;  Surgeon: Lori Maw, MD;  [facility];  Service: Cardiovascular;  Laterality: N/A;     Family History  Problem Relation Age of Onset  . Kidney disease Mother   . Heart disease Father   . Cancer Father     colon  . Cancer Sister     breast  . Heart attack Neg Hx   . Stroke Paternal Grandmother   . Hypertension Father   . Hypertension Paternal Grandmother      Social History   Social History  . Marital Status: Married    Spouse Name: N/A  . Number of Children: N/A  . Years of Education: N/A   Occupational History  . Not on file.   Social History Main Topics  . Smoking status: Never Smoker   . Smokeless tobacco: Never Used  . Alcohol Use: No  . Drug Use: No  . Sexual Activity: Not Currently   Other Topics Concern  . Not on file   Social History Narrative     BP 122/88 mmHg  Pulse 77  Ht  (1.626 m)  Wt 216 lb 9.6 oz (98.249 kg)  BMI 37.16 kg/m2  SpO2 96%  Physical Exam:  Well appearing 64 yo woman, NAD HEENT: Unremarkable Neck:  No JVD, no thyromegally Back:  No CVA tenderness Lungs:  Clear with no wheezes, well healed ICD incision. HEART:  Regular rate rhythm, no murmurs, no rubs, no clicks Abd:  soft, positive bowel sounds, no organomegally, no rebound, no guarding Ext:  2 plus pulses, no edema, no cyanosis, no clubbing Skin:  No rashes no nodules Neuro:  CN II through XII intact, motor grossly intact   DEVICE  Normal device function.  See PaceArt for details.    Assess/Plan:

## 2015-08-07 ENCOUNTER — Other Ambulatory Visit: Payer: Self-pay | Admitting: Cardiovascular Disease

## 2015-08-15 ENCOUNTER — Other Ambulatory Visit: Payer: Self-pay | Admitting: Cardiovascular Disease

## 2015-09-03 ENCOUNTER — Other Ambulatory Visit: Payer: Self-pay | Admitting: Cardiovascular Disease

## 2015-09-18 ENCOUNTER — Other Ambulatory Visit: Payer: Self-pay | Admitting: Cardiovascular Disease

## 2015-09-18 MED FILL — PANTOPRAZOLE SOD DR 40 MG T: 40 | 90 days supply | Qty: 90 | Fill #3

## 2015-09-18 MED FILL — LOSARTAN POTASSIUM 50 MG TA: 50 | 30 days supply | Qty: 30 | Fill #0

## 2015-09-26 ENCOUNTER — Ambulatory Visit (INDEPENDENT_AMBULATORY_CARE_PROVIDER_SITE_OTHER): Payer: 59 | Admitting: Cardiovascular Disease

## 2015-09-26 ENCOUNTER — Encounter: Payer: Self-pay | Admitting: Cardiovascular Disease

## 2015-09-26 VITALS — BP 134/82 | HR 70 | Ht 64.0 in | Wt 215.8 lb

## 2015-09-26 DIAGNOSIS — I25119 Atherosclerotic heart disease of native coronary artery with unspecified angina pectoris: Secondary | ICD-10-CM | POA: Diagnosis not present

## 2015-09-26 DIAGNOSIS — I5022 Chronic systolic (congestive) heart failure: Secondary | ICD-10-CM

## 2015-09-26 DIAGNOSIS — I2510 Atherosclerotic heart disease of native coronary artery without angina pectoris: Secondary | ICD-10-CM

## 2015-09-26 LAB — COMPREHENSIVE METABOLIC PANEL
ALBUMIN: 3.3 g/dL — AB (ref 3.6–5.1)
ALK PHOS: 98 U/L (ref 33–130)
ALT: 7 U/L (ref 6–29)
AST: 13 U/L (ref 10–35)
BILIRUBIN TOTAL: 0.3 mg/dL (ref 0.2–1.2)
BUN: 15 mg/dL (ref 7–25)
CALCIUM: 9.3 mg/dL (ref 8.6–10.4)
CO2: 29 mmol/L (ref 20–31)
CREATININE: 1.09 mg/dL — AB (ref 0.50–0.99)
Chloride: 102 mmol/L (ref 98–110)
GLUCOSE: 143 mg/dL — AB (ref 65–99)
Potassium: 3.4 mmol/L — ABNORMAL LOW (ref 3.5–5.3)
Sodium: 140 mmol/L (ref 135–146)
Total Protein: 7.7 g/dL (ref 6.1–8.1)

## 2015-09-26 LAB — LIPID PANEL
CHOLESTEROL: 118 mg/dL — AB (ref 125–200)
HDL: 40 mg/dL — ABNORMAL LOW (ref >=46)
LDL Cholesterol: 61 mg/dL (ref <130)
Total CHOL/HDL Ratio: 3 Ratio (ref <=5.0)
Triglycerides: 85 mg/dL (ref <150)
VLDL: 17 mg/dL (ref <30)

## 2015-09-26 NOTE — Patient Instructions (Signed)
Medication Instructions:  Your physician recommends that you continue on your current medications as directed. Please refer to the Current Medication list given to you today.   Labwork: TODAY - cholesterol, liver, basic metabolic panel   Testing/Procedures: None Ordered   Follow-Up: Your physician wants you to follow-up in: 1 year with Dr. Nahser.  You will receive a reminder letter in the mail two months in advance. If you don't receive a letter, please call our office to schedule the follow-up appointment.   If you need a refill on your cardiac medications before your next appointment, please call your pharmacy.   Thank you for choosing [facility]! Wilfrid Hyser, RN [phone]   

## 2015-09-26 NOTE — Progress Notes (Signed)
Cardiology Office Note   Date:  09/26/2015   ID:  Lori, Beard [birth year], MRN [id number]  PCP:  Lupe Carney, MD  Cardiologist:   Elease Hashimoto Lori Ping, MD   Chief Complaint  Patient presents with  . Coronary Artery Disease      History of Present Illness: Lori Beard is a 65 y.o. female who presents for   1. Chronic systolic congestive heart failure 2. Hypertension 3 .obesty - s/p lap band surgery 4. coronary artery disease-status post non-ST segment elevation myocardial infarction complicated by ventricular tachycardia 5. Status post ICD placement 08/04/13   History of Present Illness: Notes from [place] weaver - 09/04/14:  Lori Beard is a 65 y.o. female with a hx of HTN, obesity s/p LAP-BAND surgery and sleep apnea. She was admitted 07/2013 with pneumonia. She ruled in for a NSTEMI. Her hospitalization was complicated by monomorphic VTach that progressed to VFib due to R on T PVC in the setting of hypokalemia with a K of 2.9. Echocardiogram (08/01/13): Mild LVH, global HK, EF 40-45%, Gr 1 DD, MAC, mild RVE, mildly reduced RVSF, PASP 31-35. LHC (08/02/13): Normal coronary arteries, EF 35%, LVEDP 17. She was seen by EP and underwent ICD implantation for secondary prevention of malignant ventricular arrhythmias in the setting of nonischemic cardiomyopathy. She underwent St. [name] single-chamber defibrillator implantation by Dr. [name] 08/04/13.  I saw her 08/11/13. She had resumed verapamil and metoprolol due to high blood pressures. I stopped these medications and placed on losartan and increased her BiDil to 3x per day.   Doing well since last seen. The patient denies chest pain, shortness of breath, syncope, orthopnea, PND or significant pedal edema. No ICD shocks.   Jan. 30, 2015:  Lori Beard presents today for followup evaluation of her hypertension and chronic systolic congestive heart failure. Her left ventricular ejection fraction is approximate  35%. She has a history of a cardiac arrest and has had an ICD placed. She sees Dr. [name] for management of that.  12/27/2013:  Doing well  Oct. 26, 2015:  She is only taking the BiDil twice a day. He causes profound weakness and dizziness. She was originally written to take it 3 times a day but she's not able to take it twice a day.   . Even still, he causes her to be very weak and she has to lie down after taking it for the next 30 minutes to one hour. Otherwise she's doing well. He's not having any profound shortness of breath. No chest pain  Jan. 26, 2016:  Doing well.  No cp or dyspnea.   Is on hydralazine and ISDN instead of BiDIl now.    Jan. 19, 2017:  Lori Beard is doing well. No CP or dyspnea.  Has CAD - hx of MI Has CHF and and ICD .    Past Medical History  Diagnosis Date  . Hypertension   . Morbid obesity (HCC)     a. s/p lapband surgery 11/2012.  [name] GERD (gastroesophageal reflux disease)   . Arthritis     ALL OVER  . Sleep apnea 02/15/2012    SLEEP STUDY IN EPIC - MILD OSA-CPAP NOT RECOMMENDED-HOME OXYGEN SUGGESTED BECAUSE OF OXYGEN DESATS ON ROOM AIR.  [name] Cardiac arrest (HCC) 08/01/2013    Polymorphic VT  . NICM (nonischemic cardiomyopathy) (HCC)     Echocardiogram (08/01/13): Mild LVH, global HK, EF 40-45%, Gr 1 DD, MAC, mild RVE, mildly reduced RVSF, PASP 31-35.  LHC (  08/02/13):  Normal coronary arteries, EF 35%, LVEDP 17.  . Chronic systolic CHF (congestive heart failure) (HCC)   . Ventricular tachycardia (HCC)     a. R on T PVC during admission for pneumonia/NSTEMI => monomorphic VT=>VF=>defib; s/p ICD  . S/P implantation of automatic cardioverter/defibrillator (AICD)     07/2013 [name])  . Myocardial infarction [facility]) 08/06/13    Past Surgical History  Procedure Laterality Date  . Abdominal hysterectomy    . Knee arthroscopy    . Cholecystectomy    . Breath tek h pylori  02/15/2012    Procedure: BREATH TEK H PYLORI;  Surgeon: Valarie Merino, MD;   [facility];  Service: General;  Laterality: N/A;  745  . Joint replacement      BILATERAL TOTAL HIP REPLACEMENTS  . Mesh applied to lap port N/A 11/22/2012    Procedure: MESH APPLIED TO LAP PORT;  Surgeon: Valarie Merino, MD;  Location: [facility];  Service: General;  Laterality: N/A;  . Laparoscopic gastric banding    . Pacemaker insertion   08/04/2013    St. [name] single chamber ICD  . Cardiac catheterization  08/02/2013    Normal coronary arteries, EF 35%   . Left heart catheterization with coronary angiogram N/A 08/02/2013    Procedure: LEFT HEART CATHETERIZATION WITH CORONARY ANGIOGRAM;  Surgeon: Iran Ouch, MD;  Location: [facility];  Service: Cardiovascular;  Laterality: N/A;  . Implantable cardioverter defibrillator implant N/A 08/04/2013    Procedure: IMPLANTABLE CARDIOVERTER DEFIBRILLATOR IMPLANT;  Surgeon: Marinus Maw, MD;  [facility] C[facility];  Service: Cardiovascular;  Laterality: N/A;     Current Outpatient Prescriptions  Medication Sig Dispense Refill  . acetaminophen (TYLENOL) 325 MG tablet Take 1-2 tablets (325-650 mg total) by mouth every 4 (four) hours as needed for mild pain.    [name] amitriptyline (ELAVIL) 25 MG tablet Take 75 mg by mouth at bedtime.    [name] aspirin 81 MG tablet Take 1 tablet (81 mg total) by mouth every evening.    [name] atorvastatin (LIPITOR) 10 MG tablet TAKE 1 TABLET BY MOUTH DAILY AT 6 PM. 90 tablet 0  . carvedilol (COREG) 12.5 MG tablet TAKE 2 TABLETS BY MOUTH 2 TIMES DAILY WITH A MEAL 60 tablet 3  . furosemide (LASIX) 40 MG tablet TAKE 1 TABLET BY MOUTH EVERY MORNING. 90 tablet 0  . hydrALAZINE (APRESOLINE) 25 MG tablet Take 1 tablet (25 mg total) by mouth 3 (three) times daily. (Patient taking differently: Take 25 mg by mouth 2 (two) times daily. ) 270 tablet 3  . HYDROcodone-acetaminophen (NORCO/VICODIN) 5-325 MG per tablet Take 2 tablets by mouth every 6 (six) hours as needed for pain. 20 tablet 0  . isosorbide dinitrate (ISORDIL)  20 MG tablet Take 1 tablet (20 mg total) by mouth 3 (three) times daily. (Patient taking differently: Take 20 mg by mouth 2 (two) times daily. ) 270 tablet 3  . losartan (COZAAR) 50 MG tablet TAKE 1 TABLET BY MOUTH ONCE DAILY 30 tablet 1  . Multiple Vitamin (MULTIVITAMIN) tablet Take 1 tablet by mouth daily.    . pantoprazole (PROTONIX) 40 MG tablet Take 40 mg by mouth every morning.     . potassium chloride SA (K-DUR,KLOR-CON) 20 MEQ tablet TAKE 2 TABLETS BY MOUTH DAILY. 180 tablet 0   No current facility-administered medications for this visit.    Allergies:   Ace inhibitors; Iron; and Morphine and related    Social History:  The patient  reports that she has never smoked. She has never used smokeless tobacco. She reports that she does not drink alcohol or use illicit drugs.   Family History:  The patient's family history includes Cancer in her father and sister; Heart disease in her father; Hypertension in her father and paternal grandmother; Kidney disease in her mother; Stroke in her paternal grandmother. There is no history of Heart attack.    ROS:  Please see the history of present illness.   Otherwise, review of systems are positive for none.   All other systems are reviewed and negative.    PHYSICAL EXAM: VS:  BP 134/82 mmHg  Pulse 70  Ht  (1.626 m)  Wt 215 lb 12.8 oz (97.886 kg)  BMI 37.02 kg/m2 , BMI Body mass index is 37.02 kg/(m^2). GEN: Well nourished, well developed, in no acute distress HEENT: normal Neck: no JVD, carotid bruits, or masses Cardiac: RRR; no murmurs, rubs, or gallops,no edema  Respiratory:  clear to auscultation bilaterally, normal work of breathing GI: soft, nontender, nondistended, + BS MS: no deformity or atrophy Skin: warm and dry, no rash Neuro:  Strength and sensation are intact Psych: normal   EKG:  EKG is ordered today. Jan. 19, 2017:  NSR at 61 with 1st degree AV block   Recent Labs: 10/02/2014: BUN 10; Creatinine, Ser 1.13;  Potassium 3.6; Sodium 140    Lipid Panel    Component Value Date/Time   CHOL 111 08/02/2013 0617   TRIG 122 08/02/2013 0617   HDL 14* 08/02/2013 0617   CHOLHDL 7.9 08/02/2013 0617   VLDL 24 08/02/2013 0617   LDLCALC 73 08/02/2013 0617      Wt Readings from Last 3 Encounters:  09/26/15 215 lb 12.8 oz (97.886 kg)  07/23/15 216 lb 9.6 oz (98.249 kg)  04/02/15 216 lb 1.6 oz (98.022 kg)      Other studies Reviewed: Additional studies/ records that were reviewed today include: . Review of the above records demonstrates:    ASSESSMENT AND PLAN:  1. Chronic systolic congestive heart failure - she seems to be well compensated  2. Hypertension- BP is stable   3 .obesty - s/p lap band surgery - stable   4. coronary artery disease-status post non-ST segment elevation myocardial infarction complicated by ventricular tachycardia - no angina  5. Status post ICD placement 08/04/13 - - followed by Dr. [name]    Current medicines are reviewed at length with the patient today.  The patient does not have concerns regarding medicines.  The following changes have been made:  no change  Labs/ tests ordered today include: BMP    No orders of the defined types were placed in this encounter.     Disposition:   FU with me in 6 months.     Signed, Lori Beard, Lori Ping, MD  09/26/2015 3:44 PM    [facility] [address] [place], [place], [place]  [address] Phone: [phone]; Fax: [phone]

## 2015-09-27 ENCOUNTER — Telehealth: Payer: Self-pay | Admitting: *Deleted

## 2015-09-27 NOTE — Telephone Encounter (Signed)
Pt has been notified of lab results and recommendation to slightly increase K+ rich foods due to K+ just slight low. Pt verbalized understanding to instructions given today.

## 2015-10-03 ENCOUNTER — Other Ambulatory Visit: Payer: Self-pay | Admitting: Cardiovascular Disease

## 2015-10-03 MED FILL — AMITRIPTYLINE HCL 25 MG TAB: 25 | 90 days supply | Qty: 270 | Fill #0

## 2015-10-03 MED FILL — CARVEDILOL 12.5 MG TABLET: 12.5 | 30 days supply | Qty: 120 | Fill #1

## 2015-10-03 MED FILL — FUROSEMIDE 40 MG TABLET: 40 | 90 days supply | Qty: 90 | Fill #0

## 2015-10-17 MED FILL — HYDROCODON-APAP 7.5-325: 7.5-325 | 23 days supply | Qty: 90 | Fill #0

## 2015-10-17 MED FILL — LOSARTAN POTASSIUM 50 MG TA: 50 | 30 days supply | Qty: 30 | Fill #1

## 2015-10-22 ENCOUNTER — Emergency Department (HOSPITAL_COMMUNITY)
Admission: EM | Admit: 2015-10-22 | Discharge: 2015-10-22 | Disposition: A | Discharge status: Home / Self Care | Payer: 59 | Attending: Emergency Medicine | Admitting: Emergency Medicine

## 2015-10-22 ENCOUNTER — Ambulatory Visit (INDEPENDENT_AMBULATORY_CARE_PROVIDER_SITE_OTHER): Payer: 59 | Admitting: *Deleted

## 2015-10-22 ENCOUNTER — Telehealth: Payer: Self-pay | Admitting: Cardiology

## 2015-10-22 ENCOUNTER — Encounter (HOSPITAL_COMMUNITY): Payer: Self-pay | Admitting: *Deleted

## 2015-10-22 DIAGNOSIS — L299 Pruritus, unspecified: Secondary | ICD-10-CM | POA: Diagnosis not present

## 2015-10-22 DIAGNOSIS — I5022 Chronic systolic (congestive) heart failure: Secondary | ICD-10-CM

## 2015-10-22 DIAGNOSIS — I428 Other cardiomyopathies: Secondary | ICD-10-CM

## 2015-10-22 DIAGNOSIS — Z8669 Personal history of other diseases of the nervous system and sense organs: Secondary | ICD-10-CM | POA: Diagnosis not present

## 2015-10-22 DIAGNOSIS — Z79899 Other long term (current) drug therapy: Secondary | ICD-10-CM | POA: Diagnosis not present

## 2015-10-22 DIAGNOSIS — Z9889 Other specified postprocedural states: Secondary | ICD-10-CM | POA: Diagnosis not present

## 2015-10-22 DIAGNOSIS — Z95810 Presence of automatic (implantable) cardiac defibrillator: Secondary | ICD-10-CM | POA: Insufficient documentation

## 2015-10-22 DIAGNOSIS — I252 Old myocardial infarction: Secondary | ICD-10-CM | POA: Diagnosis not present

## 2015-10-22 DIAGNOSIS — M1990 Unspecified osteoarthritis, unspecified site: Secondary | ICD-10-CM | POA: Insufficient documentation

## 2015-10-22 DIAGNOSIS — K219 Gastro-esophageal reflux disease without esophagitis: Secondary | ICD-10-CM | POA: Insufficient documentation

## 2015-10-22 DIAGNOSIS — I10 Essential (primary) hypertension: Secondary | ICD-10-CM | POA: Diagnosis not present

## 2015-10-22 DIAGNOSIS — I429 Cardiomyopathy, unspecified: Secondary | ICD-10-CM

## 2015-10-22 DIAGNOSIS — R21 Rash and other nonspecific skin eruption: Secondary | ICD-10-CM | POA: Diagnosis present

## 2015-10-22 DIAGNOSIS — Z7982 Long term (current) use of aspirin: Secondary | ICD-10-CM | POA: Diagnosis not present

## 2015-10-22 MED ORDER — HYDROXYZINE HCL 25 MG PO TABS
25.0000 mg | ORAL_TABLET | Freq: Once | ORAL | Status: AC
  Administered 2015-10-22: 25 mg via ORAL
  Filled 2015-10-22: qty 1

## 2015-10-22 MED ORDER — TRIAMCINOLONE 0.1 % CREAM:EUCERIN CREAM 1:1: 1.0000 "application " | TOPICAL_CREAM | Freq: Two times a day (BID) | CUTANEOUS | Status: DC

## 2015-10-22 MED ORDER — HYDROXYZINE HCL 25 MG PO TABS: 25.0000 mg | ORAL_TABLET | Freq: Four times a day (QID) | ORAL | Status: DC

## 2015-10-22 NOTE — Progress Notes (Signed)
Remote ICD transmission.   

## 2015-10-22 NOTE — ED Notes (Signed)
Pt reports rash to medial left lower leg and lateral right lower leg. Pt pain and itching with the rash. No rash noted to both legs

## 2015-10-22 NOTE — Discharge Instructions (Signed)
Your exam today was normal. Your skin was dry but I did not see a rash. Given the burning sensation you describe it is possible that your symptoms are related to nerves (neuropathy). However, this would be unusual since you do not have diabetes. Please call your primary care provider to schedule a follow up appointment. In the meantime I will give you prescriptions for itching medicine and cream. Return to the ER for new or worsening symptoms.

## 2015-10-22 NOTE — ED Provider Notes (Signed)
History  By signing my name below, I, Karle Plumber, attest that this documentation has been prepared under the direction and in the presence of Jordon Kristiansen, [place]. Electronically Signed: Karle Plumber, ED Scribe. 10/22/2015. 9:34 PM.  Chief Complaint  Patient presents with  . Rash   The history is provided by the patient and medical records. No language interpreter was used.    HPI Comments:  Lori Beard is a 65 y.o. female who presents to the Emergency Department complaining of a worsening itching, burning rash to bilateral lower extremities that began a couple days ago. She reports the rash is on the lateral right leg and medial left leg. She states the rash burns and it itches. Pt has applied Cortisone cream and Lotrimin cream to the areas with no significant relief of the symptoms. Touching the areas causes some mild pain. She denies alleviating factors. She denies fever, chills, red streaking, warmth or drainage from the areas. She denies any recent travel or camping. She denies any new creams, lotions, soaps, detergents or any other personal hygiene products. She states she has had a shingles vaccination and has had chicken pox as a child. She does have a PCP.   Past Medical History  Diagnosis Date  . Hypertension   . Morbid obesity (HCC)     a. s/p lapband surgery 11/2012.  [name] GERD (gastroesophageal reflux disease)   . Arthritis     ALL OVER  . Sleep apnea 02/15/2012    SLEEP STUDY IN EPIC - MILD OSA-CPAP NOT RECOMMENDED-HOME OXYGEN SUGGESTED BECAUSE OF OXYGEN DESATS ON ROOM AIR.  [name] Cardiac arrest (HCC) 08/01/2013    Polymorphic VT  . NICM (nonischemic cardiomyopathy) (HCC)     Echocardiogram (08/01/13): Mild LVH, global HK, EF 40-45%, Gr 1 DD, MAC, mild RVE, mildly reduced RVSF, PASP 31-35.  LHC (08/02/13):  Normal coronary arteries, EF 35%, LVEDP 17.  . Chronic systolic CHF (congestive heart failure) (HCC)   . Ventricular tachycardia (HCC)     a. R on T PVC during  admission for pneumonia/NSTEMI => monomorphic VT=>VF=>defib; s/p ICD  . S/P implantation of automatic cardioverter/defibrillator (AICD)     07/2013 [name])  . Myocardial infarction [facility]) 08/06/13   Past Surgical History  Procedure Laterality Date  . Abdominal hysterectomy    . Knee arthroscopy    . Cholecystectomy    . Breath tek h pylori  02/15/2012    Procedure: BREATH TEK H PYLORI;  Surgeon: Valarie Merino, MD;  Location: [facility];  Service: General;  Laterality: N/A;  745  . Joint replacement      BILATERAL TOTAL HIP REPLACEMENTS  . Mesh applied to lap port N/A 11/22/2012    Procedure: MESH APPLIED TO LAP PORT;  Surgeon: Valarie Merino, MD;  Location: [facility];  Service: General;  Laterality: N/A;  . Laparoscopic gastric banding    . Pacemaker insertion   08/04/2013    St. [name] single chamber ICD  . Cardiac catheterization  08/02/2013    Normal coronary arteries, EF 35%   . Left heart catheterization with coronary angiogram N/A 08/02/2013    Procedure: LEFT HEART CATHETERIZATION WITH CORONARY ANGIOGRAM;  Surgeon: Iran Ouch, MD;  Location: [facility]ATH [facility];  Service: Cardiovascular;  Laterality: N/A;  . Implantable cardioverter defibrillator implant N/A 08/04/2013    Procedure: IMPLANTABLE CARDIOVERTER DEFIBRILLATOR IMPLANT;  Surgeon: Marinus Maw, MD;  Location: [facility] CATH LAB;  Service: Cardiovascular;  Laterality: N/A;   Family History  Problem Relation  Age of Onset  . Kidney disease Mother   . Heart disease Father   . Cancer Father     colon  . Cancer Sister     breast  . Heart attack Neg Hx   . Stroke Paternal Grandmother   . Hypertension Father   . Hypertension Paternal Grandmother    Social History  Substance Use Topics  . Smoking status: Never Smoker   . Smokeless tobacco: Never Used  . Alcohol Use: No   OB History    No data available     Review of Systems A complete 10 system review of systems was obtained and all systems are negative except as  noted in the HPI and PMH.   Allergies  Ace inhibitors; Iron; and Morphine and related  Home Medications   Prior to Admission medications   Medication Sig Start Date End Date Taking? Authorizing Provider  acetaminophen (TYLENOL) 325 MG tablet Take 1-2 tablets (325-650 mg total) by mouth every 4 (four) hours as needed for mild pain. 08/05/13   Christiane Ha, MD  amitriptyline (ELAVIL) 25 MG tablet Take 75 mg by mouth at bedtime. 04/12/12   Sondra Barges, PA-C  aspirin 81 MG tablet Take 1 tablet (81 mg total) by mouth every evening. 10/06/13   Vesta Mixer, MD  atorvastatin (LIPITOR) 10 MG tablet TAKE 1 TABLET BY MOUTH DAILY AT 6 PM. 08/08/15   Vesta Mixer, MD  carvedilol (COREG) 12.5 MG tablet TAKE 2 TABLETS BY MOUTH 2 TIMES DAILY WITH A MEAL 09/03/15   Vesta Mixer, MD  furosemide (LASIX) 40 MG tablet TAKE 1 TABLET BY MOUTH EVERY MORNING. 10/03/15   Vesta Mixer, MD  hydrALAZINE (APRESOLINE) 25 MG tablet Take 1 tablet (25 mg total) by mouth 3 (three) times daily. Patient taking differently: Take 25 mg by mouth 2 (two) times daily.  09/14/14   Vesta Mixer, MD  HYDROcodone-acetaminophen (NORCO/VICODIN) 5-325 MG per tablet Take 2 tablets by mouth every 6 (six) hours as needed for pain. 01/08/13   Marisa Severin, MD  hydrOXYzine (ATARAX/VISTARIL) 25 MG tablet Take 1 tablet (25 mg total) by mouth every 6 (six) hours. 10/22/15   Ace Gins Brigido Mera, PA-C  isosorbide dinitrate (ISORDIL) 20 MG tablet Take 1 tablet (20 mg total) by mouth 3 (three) times daily. Patient taking differently: Take 20 mg by mouth 2 (two) times daily.  09/14/14   Vesta Mixer, MD  losartan (COZAAR) 50 MG tablet TAKE 1 TABLET BY MOUTH ONCE DAILY 09/18/15   Vesta Mixer, MD  Multiple Vitamin (MULTIVITAMIN) tablet Take 1 tablet by mouth daily.    Historical Provider, MD  pantoprazole (PROTONIX) 40 MG tablet Take 40 mg by mouth every morning.     Historical Provider, MD  potassium chloride SA (K-DUR,KLOR-CON) 20 MEQ tablet TAKE  2 TABLETS BY MOUTH DAILY. 04/04/15   Vesta Mixer, MD  Triamcinolone Acetonide (TRIAMCINOLONE 0.1 % CREAM : EUCERIN) CREA Apply 1 application topically 2 (two) times daily. 10/22/15   Carlene Coria, PA-C   Triage Vitals: BP 130/86 mmHg  Pulse 88  Temp(Src) 98.2 F (36.8 C) (Oral)  Resp 16  Wt 212 lb (96.163 kg)  SpO2 95% Physical Exam  Constitutional: She is oriented to person, place, and time. She appears well-developed and well-nourished.  HENT:  Head: Normocephalic and atraumatic.  Eyes: EOM are normal.  Neck: Normal range of motion.  Cardiovascular: Normal rate.   Pulmonary/Chest: Effort normal.  Musculoskeletal: Normal  range of motion.  Neurological: She is alert and oriented to person, place, and time.  Skin: Skin is warm and dry.  There is no visible or palpable rash on either leg. Skin is slightly dry. There are some chronic venous stasis skin changes.   Psychiatric: She has a normal mood and affect. Her behavior is normal.  Nursing note and vitals reviewed.   ED Course  Procedures (including critical care time) DIAGNOSTIC STUDIES: Oxygen Saturation is 95% on RA, normal by my interpretation.   COORDINATION OF CARE: 9:00 PM- Will prescribe triamcinolone cream and Hydroxyzine. Advised pt to follow up with PCP for continued or worsening symptoms. Pt verbalizes understanding and agrees to plan.  Medications  hydrOXYzine (ATARAX/VISTARIL) tablet 25 mg (25 mg Oral Given 10/22/15 2123)    Labs Review Labs Reviewed - No data to display  Imaging Review No results found. I have personally reviewed and evaluated these images and lab results as part of my medical decision-making.   EKG Interpretation None      MDM   Final diagnoses:  Pruritus of skin    Possible neuropathy though pt denies history of diabetes or similar symptoms. She is concerned about possible shingles but it would be unlikely to present in bilateral legs at the same time. At this time no  indication for further workup in the ED. I discussed ddx and exam with pt. Rx given for vistaril and triamcinalone/eucarin lotion. Instructed to f/u with PCP for further evaluation this week. ER return precautions given.   I personally performed the services described in this documentation, which was scribed in my presence. The recorded information has been reviewed and is accurate.     Carlene Coria, PA-C 10/23/15 1610  Mancel Bale, MD 10/24/15 [phone]

## 2015-10-22 NOTE — Telephone Encounter (Signed)
Spoke with pt and reminded pt of remote transmission that is due today. Pt verbalized understanding.   

## 2015-10-23 ENCOUNTER — Encounter: Payer: Self-pay | Admitting: Cardiology

## 2015-10-23 LAB — CUP PACEART REMOTE DEVICE CHECK
Battery Remaining Longevity: 83 mo
Battery Remaining Percentage: 81 %
HIGH POWER IMPEDANCE MEASURED VALUE: 69 Ohm
HIGH POWER IMPEDANCE MEASURED VALUE: 69 Ohm
Implantable Lead Location: 753860
Lead Channel Sensing Intrinsic Amplitude: 12 mV
Lead Channel Setting Pacing Amplitude: 2.5 V
Lead Channel Setting Pacing Pulse Width: 0.5 ms
MDC IDC LEAD IMPLANT DT: 20141128
MDC IDC MSMT BATTERY VOLTAGE: 2.99 V
MDC IDC MSMT LEADCHNL RV IMPEDANCE VALUE: 450 Ohm
MDC IDC PG SERIAL: 7136396
MDC IDC SESS DTM: 20170214191044
MDC IDC SET LEADCHNL RV SENSING SENSITIVITY: 0.5 mV
MDC IDC STAT BRADY RV PERCENT PACED: 1 %

## 2015-10-23 MED FILL — TRIAM0.1%/EUCERIN 1:1: 30 days supply | Qty: 240 | Fill #0

## 2015-10-23 MED FILL — hydrOXYzine HCL 25 MG TABS: 25 | 3 days supply | Qty: 12 | Fill #0

## 2015-10-30 ENCOUNTER — Other Ambulatory Visit: Payer: Self-pay | Admitting: Cardiovascular Disease

## 2015-10-31 MED FILL — ISOSORBIDE DN 20 MG TABLET: 20 | 90 days supply | Qty: 270 | Fill #0

## 2015-10-31 MED FILL — CARVEDILOL 12.5 MG TABLET: 12.5 | 30 days supply | Qty: 60 | Fill #0

## 2015-10-31 MED FILL — ATORVASTATIN 10 MG TABLET: 10 | 90 days supply | Qty: 90 | Fill #0

## 2015-10-31 MED FILL — hydrALAZINE HCL 25 MG TABS: 25 | 90 days supply | Qty: 270 | Fill #0

## 2015-11-11 NOTE — Progress Notes (Signed)
Cardiology Office Note:    Date:  11/12/2015   ID:  [name], DOB 09-18-1950, MRN [id number]  PCP:  Lupe Carney, MD  Cardiologist:  Dr. Delane Ginger   Electrophysiologist:  Dr. [name]   Chief Complaint  Patient presents with  . Arm Pain    History of Present Illness:     Lori Beard is a 65 y.o. female with a hx of HTN, obesity s/p LAP-BAND surgery and sleep apnea. She was admitted 07/2013 with pneumonia. She ruled in for a NSTEMI. Her hospitalization was complicated by monomorphic VTach that progressed to VFib due to R on T PVC in the setting of hypokalemia with a K of 2.9. Echocardiogram (08/01/13): Mild LVH, global HK, EF 40-45%, Gr 1 DD, MAC, mild RVE, mildly reduced RVSF, PASP 31-35. LHC (08/02/13): Normal coronary arteries, EF 35%, LVEDP 17. She was seen by EP and underwent ICD implantation for secondary prevention of malignant ventricular arrhythmias in the setting of nonischemic cardiomyopathy. She underwent St. [name] single-chamber defibrillator implantation by Dr. [name] 08/04/13.  Last seen by Dr. Delane Ginger 1/17.    She presents for evaluation of L shoulder pain.  This started a few days ago.  She awoke with this.  No injury. She did not eat for several hours today and did have some dizziness earlier.  Otherwise, she has been doing well.  No syncope.  No ICD Rx.  No palpitations.  No CP, SOB.  No orthopnea, PND, Edema.     Past Medical History  Diagnosis Date  . Hypertension   . Morbid obesity (HCC)     a. s/p lapband surgery 11/2012.  [name] GERD (gastroesophageal reflux disease)   . Arthritis     ALL OVER  . Sleep apnea 02/15/2012    SLEEP STUDY IN EPIC - MILD OSA-CPAP NOT RECOMMENDED-HOME OXYGEN SUGGESTED BECAUSE OF OXYGEN DESATS ON ROOM AIR.  [name] Cardiac arrest (HCC) 08/01/2013    Polymorphic VT  . NICM (nonischemic cardiomyopathy) (HCC)     Echocardiogram (08/01/13): Mild LVH, global HK, EF 40-45%, Gr 1 DD, MAC, mild RVE, mildly reduced RVSF, PASP  31-35.  LHC (08/02/13):  Normal coronary arteries, EF 35%, LVEDP 17.  . Chronic systolic CHF (congestive heart failure) (HCC)   . Ventricular tachycardia (HCC)     a. R on T PVC during admission for pneumonia/NSTEMI => monomorphic VT=>VF=>defib; s/p ICD  . S/P implantation of automatic cardioverter/defibrillator (AICD)     07/2013 [name])  . Myocardial infarction [facility]) 08/06/13    Past Surgical History  Procedure Laterality Date  . Abdominal hysterectomy    . Knee arthroscopy    . Cholecystectomy    . Breath tek h pylori  02/15/2012    Procedure: BREATH TEK H PYLORI;  Surgeon: Valarie Merino, MD;  Location: [facility];  Service: General;  Laterality: N/A;  745  . Joint replacement      BILATERAL TOTAL HIP REPLACEMENTS  . Mesh applied to lap port N/A 11/22/2012    Procedure: MESH APPLIED TO LAP PORT;  Surgeon: Valarie Merino, MD;  Location: [facility];  Service: General;  Laterality: N/A;  . Laparoscopic gastric banding    . Pacemaker insertion   08/04/2013    St. [name] single chamber ICD  . Cardiac catheterization  08/02/2013    Normal coronary arteries, EF 35%   . Left heart catheterization with coronary angiogram N/A 08/02/2013    Procedure: LEFT HEART CATHETERIZATION WITH CORONARY ANGIOGRAM;  Surgeon: Chelsea Aus  Kirke Corin, MD;  Location: [facility] CATH LAB;  Service: Cardiovascular;  Laterality: N/A;  . Implantable cardioverter defibrillator implant N/A 08/04/2013    Procedure: IMPLANTABLE CARDIOVERTER DEFIBRILLATOR IMPLANT;  Surgeon: Marinus Maw, MD;  Location: [facility] CATH LAB;  Service: Cardiovascular;  Laterality: N/A;    Current Medications: Outpatient Prescriptions Prior to Visit  Medication Sig Dispense Refill  . acetaminophen (TYLENOL) 325 MG tablet Take 1-2 tablets (325-650 mg total) by mouth every 4 (four) hours as needed for mild pain.    [name] amitriptyline (ELAVIL) 25 MG tablet Take 75 mg by mouth at bedtime.    [name] aspirin 81 MG tablet Take 1 tablet (81 mg total) by mouth every  evening.    [name] atorvastatin (LIPITOR) 10 MG tablet TAKE 1 TABLET BY MOUTH ONCE DAILY AT 6 PM 90 tablet 1  . carvedilol (COREG) 12.5 MG tablet TAKE 2 TABLETS BY MOUTH TWICE DAILY WITH MEALS **NEED TO SCHEDULE APPOINTMENT** 60 tablet 10  . furosemide (LASIX) 40 MG tablet TAKE 1 TABLET BY MOUTH EVERY MORNING. 90 tablet 3  . hydrALAZINE (APRESOLINE) 25 MG tablet TAKE 1 TABLET BY MOUTH 3 TIMES DAILY. 270 tablet 1  . HYDROcodone-acetaminophen (NORCO/VICODIN) 5-325 MG per tablet Take 2 tablets by mouth every 6 (six) hours as needed for pain. 20 tablet 0  . hydrOXYzine (ATARAX/VISTARIL) 25 MG tablet Take 1 tablet (25 mg total) by mouth every 6 (six) hours. 12 tablet 0  . Multiple Vitamin (MULTIVITAMIN) tablet Take 1 tablet by mouth daily.    . pantoprazole (PROTONIX) 40 MG tablet Take 40 mg by mouth every morning.     . potassium chloride SA (K-DUR,KLOR-CON) 20 MEQ tablet TAKE 2 TABLETS BY MOUTH DAILY. 180 tablet 0  . Triamcinolone Acetonide (TRIAMCINOLONE 0.1 % CREAM : EUCERIN) CREA Apply 1 application topically 2 (two) times daily. 1 each 0  . losartan (COZAAR) 50 MG tablet TAKE 1 TABLET BY MOUTH ONCE DAILY 30 tablet 1  . isosorbide dinitrate (ISORDIL) 20 MG tablet TAKE 1 TABLET BY MOUTH 3 TIMES DAILY (Patient taking differently: TAKE 1 TABLET BY MOUTH 2 TIMES DAILY) 270 tablet 1   No facility-administered medications prior to visit.     Allergies:   Ace inhibitors; Iron; and Morphine and related   Social History   Social History  . Marital Status: Married    Spouse Name: N/A  . Number of Children: N/A  . Years of Education: N/A   Social History Main Topics  . Smoking status: Never Smoker   . Smokeless tobacco: Never Used  . Alcohol Use: No  . Drug Use: No  . Sexual Activity: Not Currently   Other Topics Concern  . None   Social History Narrative     Family History:  The patient's family history includes Cancer in her father and sister; Heart disease in her father; Hypertension in  her father and paternal grandmother; Kidney disease in her mother; Stroke in her paternal grandmother. There is no history of Heart attack.   ROS:   Please see the history of present illness.    ROS All other systems reviewed and are negative.   Physical Exam:    VS:  BP 110/62 mmHg  Pulse 74  Ht 5\' 4"  (1.626 m)  Wt 212 lb 6.4 oz (96.344 kg)  BMI 36.44 kg/m2   GEN: Well nourished, well developed, in no acute distress HEENT: normal Neck: no JVD, no masses Cardiac: Normal S1/S2, RRR; no murmurs,   no edema  Respiratory:  clear to auscultation bilaterally; no wheezing, rhonchi or rales GI: soft, nontender  MS: L shoulder with markedly decreased AROM (able to abduct ~ 20 degrees); min crepitus L shoulder, "empty can test" + Skin: warm and dry Neuro:  no focal deficits  Psych: Alert and oriented x 3, normal affect  Wt Readings from Last 3 Encounters:  11/12/15 212 lb 6.4 oz (96.344 kg)  10/22/15 212 lb (96.163 kg)  09/26/15 215 lb 12.8 oz (97.886 kg)      Studies/Labs Reviewed:     EKG:  EKG is  ordered today.  The ekg ordered today demonstrates NSR, HR 74, LAD, NSSTTW changes, no significant change since last tracing, QTc 426 ms  Recent Labs: 09/26/2015: ALT 7; BUN 15; Creat 1.09*; Potassium 3.4*; Sodium 140   Recent Lipid Panel    Component Value Date/Time   CHOL 118* 09/26/2015 1558   TRIG 85 09/26/2015 1558   HDL 40* 09/26/2015 1558   CHOLHDL 3.0 09/26/2015 1558   VLDL 17 09/26/2015 1558   LDLCALC 61 09/26/2015 1558    Additional studies/ records that were reviewed today include:   Echocardiogram (08/01/13):  Mild LVH, global HK, EF 40-45%, Gr 1 DD, MAC, mild RVE, mildly reduced RVSF, PASP 31-35.   LHC (08/02/13):  Normal coronary arteries, EF 35%, LVEDP 17.    ASSESSMENT:     1. Left shoulder pain   2. Chronic systolic CHF (congestive heart failure) (HCC)   3. NICM (nonischemic cardiomyopathy) (HCC)   4. S/P implantation of automatic  cardioverter/defibrillator (AICD)     PLAN:     In order of problems listed above:  1. L shoulder pain - Probable rotator cuff pathology.  Reassurance this is not her heart.  Arrange FU with PCP soon for evaluation. Ok to use Tylenol prn.  Alternate with ice and heat.   2. Chronic Systolic CHF - Volume stable.  Continue current Rx.  FU as planned with Dr. Delane Ginger and Dr. [name].  3. NICM - EF 40-45% by Echo in 2014.  Continue beta-blocker, angiotensin receptor blocker, hydralazine, nitrates.  4. S/p ICD - Remote check last month ok.  No therapies delivered.      Medication Adjustments/Labs and Tests Ordered: Current medicines are reviewed at length with the patient today.  Concerns regarding medicines are outlined above.  Medication changes, Labs and Tests ordered today are outlined in the Patient Instructions noted below. Patient Instructions  Medication Instructions:  Your physician recommends that you continue on your current medications as directed. Please refer to the Current Medication list given to you today.  Labwork: NONE  Testing/Procedures: NONE  Follow-Up: FOLLOW UP WITH DR. Elease Hashimoto AND DR. [name] AS PLANNED  Any Other Special Instructions Will Be Listed Below (If Applicable). YOU HAVE AN APPT WITH DR. [name] 11/14/15 @ 10:45 FOR ACUTE LEFT SHOULDER PAIN  If you need a refill on your cardiac medications before your next appointment, please call your pharmacy.   Signed, Tereso Newcomer, PA-C  11/12/2015 4:10 PM    [facility] [address] [place], [place], [place]  [address] Phone: [phone]; Fax: [phone]

## 2015-11-12 ENCOUNTER — Encounter: Payer: Self-pay | Admitting: Physician Assistant

## 2015-11-12 ENCOUNTER — Ambulatory Visit (INDEPENDENT_AMBULATORY_CARE_PROVIDER_SITE_OTHER): Payer: 59 | Admitting: Physician Assistant

## 2015-11-12 VITALS — BP 110/62 | HR 74 | Ht 64.0 in | Wt 212.4 lb

## 2015-11-12 DIAGNOSIS — I5022 Chronic systolic (congestive) heart failure: Secondary | ICD-10-CM | POA: Diagnosis not present

## 2015-11-12 DIAGNOSIS — Z95810 Presence of automatic (implantable) cardiac defibrillator: Secondary | ICD-10-CM

## 2015-11-12 DIAGNOSIS — I428 Other cardiomyopathies: Secondary | ICD-10-CM

## 2015-11-12 DIAGNOSIS — I10 Essential (primary) hypertension: Secondary | ICD-10-CM

## 2015-11-12 DIAGNOSIS — I429 Cardiomyopathy, unspecified: Secondary | ICD-10-CM | POA: Diagnosis not present

## 2015-11-12 DIAGNOSIS — M25512 Pain in left shoulder: Secondary | ICD-10-CM

## 2015-11-12 DIAGNOSIS — I25119 Atherosclerotic heart disease of native coronary artery with unspecified angina pectoris: Secondary | ICD-10-CM

## 2015-11-12 MED ORDER — LOSARTAN POTASSIUM 50 MG PO TABS: 50.0000 mg | ORAL_TABLET | Freq: Every day | ORAL | Status: DC

## 2015-11-12 MED FILL — LOSARTAN POTASSIUM 50 MG TA: 50 | 90 days supply | Qty: 90 | Fill #0

## 2015-11-12 NOTE — Patient Instructions (Addendum)
Medication Instructions:  Your physician recommends that you continue on your current medications as directed. Please refer to the Current Medication list given to you today.  Labwork: NONE  Testing/Procedures: NONE  Follow-Up: FOLLOW UP WITH DR. Elease Hashimoto AND DR. [name] AS PLANNED  Any Other Special Instructions Will Be Listed Below (If Applicable). YOU HAVE AN APPT WITH DR. [name] 11/14/15 @ 10:45 FOR ACUTE LEFT SHOULDER PAIN  If you need a refill on your cardiac medications before your next appointment, please call your pharmacy.

## 2015-11-26 MED FILL — CARVEDILOL 12.5 MG TABLET: 12.5 | 90 days supply | Qty: 360 | Fill #1

## 2015-12-13 MED FILL — PANTOPRAZOLE SOD DR 40 MG T: 40 | 90 days supply | Qty: 90 | Fill #0

## 2015-12-18 MED FILL — FUROSEMIDE 40 MG TABLET: 40 | 90 days supply | Qty: 90 | Fill #1

## 2016-01-07 DIAGNOSIS — K219 Gastro-esophageal reflux disease without esophagitis: Secondary | ICD-10-CM | POA: Diagnosis not present

## 2016-01-07 DIAGNOSIS — Z6837 Body mass index (BMI) 37.0-37.9, adult: Secondary | ICD-10-CM | POA: Diagnosis not present

## 2016-01-07 DIAGNOSIS — E669 Obesity, unspecified: Secondary | ICD-10-CM | POA: Diagnosis not present

## 2016-01-07 DIAGNOSIS — M150 Primary generalized (osteo)arthritis: Secondary | ICD-10-CM | POA: Diagnosis not present

## 2016-01-07 DIAGNOSIS — G4700 Insomnia, unspecified: Secondary | ICD-10-CM | POA: Diagnosis not present

## 2016-01-07 DIAGNOSIS — I10 Essential (primary) hypertension: Secondary | ICD-10-CM | POA: Diagnosis not present

## 2016-01-07 MED FILL — HYDROCODON-APAP 7.5-325: 7.5-325 | 23 days supply | Qty: 90 | Fill #0

## 2016-01-07 MED FILL — AMITRIPTYLINE HCL 25 MG TAB: 25 | 90 days supply | Qty: 270 | Fill #1

## 2016-01-20 DIAGNOSIS — Z1231 Encounter for screening mammogram for malignant neoplasm of breast: Secondary | ICD-10-CM | POA: Diagnosis not present

## 2016-01-20 DIAGNOSIS — Z803 Family history of malignant neoplasm of breast: Secondary | ICD-10-CM | POA: Diagnosis not present

## 2016-01-21 ENCOUNTER — Ambulatory Visit (INDEPENDENT_AMBULATORY_CARE_PROVIDER_SITE_OTHER): Payer: 59 | Admitting: *Deleted

## 2016-01-21 DIAGNOSIS — I5022 Chronic systolic (congestive) heart failure: Secondary | ICD-10-CM

## 2016-01-21 DIAGNOSIS — I429 Cardiomyopathy, unspecified: Secondary | ICD-10-CM

## 2016-01-21 DIAGNOSIS — I428 Other cardiomyopathies: Secondary | ICD-10-CM

## 2016-01-21 NOTE — Progress Notes (Signed)
Remote ICD transmission.   

## 2016-01-26 ENCOUNTER — Emergency Department (HOSPITAL_COMMUNITY): Payer: 59

## 2016-01-26 ENCOUNTER — Emergency Department (HOSPITAL_COMMUNITY)
Admission: EM | Admit: 2016-01-26 | Discharge: 2016-01-26 | Disposition: A | Discharge status: Home / Self Care | Payer: 59 | Attending: Emergency Medicine | Admitting: Emergency Medicine

## 2016-01-26 ENCOUNTER — Encounter (HOSPITAL_COMMUNITY): Payer: Self-pay | Admitting: Emergency Medicine

## 2016-01-26 DIAGNOSIS — E860 Dehydration: Secondary | ICD-10-CM | POA: Insufficient documentation

## 2016-01-26 DIAGNOSIS — R42 Dizziness and giddiness: Secondary | ICD-10-CM | POA: Diagnosis not present

## 2016-01-26 DIAGNOSIS — R404 Transient alteration of awareness: Secondary | ICD-10-CM | POA: Diagnosis not present

## 2016-01-26 DIAGNOSIS — M1990 Unspecified osteoarthritis, unspecified site: Secondary | ICD-10-CM | POA: Diagnosis not present

## 2016-01-26 DIAGNOSIS — I252 Old myocardial infarction: Secondary | ICD-10-CM | POA: Insufficient documentation

## 2016-01-26 DIAGNOSIS — Z7982 Long term (current) use of aspirin: Secondary | ICD-10-CM | POA: Insufficient documentation

## 2016-01-26 DIAGNOSIS — Z950 Presence of cardiac pacemaker: Secondary | ICD-10-CM | POA: Insufficient documentation

## 2016-01-26 DIAGNOSIS — I110 Hypertensive heart disease with heart failure: Secondary | ICD-10-CM | POA: Diagnosis not present

## 2016-01-26 DIAGNOSIS — R531 Weakness: Secondary | ICD-10-CM | POA: Diagnosis not present

## 2016-01-26 DIAGNOSIS — Z96643 Presence of artificial hip joint, bilateral: Secondary | ICD-10-CM | POA: Insufficient documentation

## 2016-01-26 DIAGNOSIS — R197 Diarrhea, unspecified: Secondary | ICD-10-CM | POA: Insufficient documentation

## 2016-01-26 DIAGNOSIS — I5022 Chronic systolic (congestive) heart failure: Secondary | ICD-10-CM | POA: Insufficient documentation

## 2016-01-26 DIAGNOSIS — R112 Nausea with vomiting, unspecified: Secondary | ICD-10-CM | POA: Diagnosis not present

## 2016-01-26 LAB — CBC WITH DIFFERENTIAL/PLATELET
BASOS ABS: 0 10*3/uL (ref 0.0–0.1)
Basophils Relative: 1 %
EOS ABS: 0.2 10*3/uL (ref 0.0–0.7)
Eosinophils Relative: 3 %
HCT: 31.2 % — ABNORMAL LOW (ref 36.0–46.0)
HEMOGLOBIN: 9.4 g/dL — AB (ref 12.0–15.0)
LYMPHS ABS: 2.6 10*3/uL (ref 0.7–4.0)
LYMPHS PCT: 43 %
MCH: 26 pg (ref 26.0–34.0)
MCHC: 30.1 g/dL (ref 30.0–36.0)
MCV: 86.2 fL (ref 78.0–100.0)
Monocytes Absolute: 0.4 10*3/uL (ref 0.1–1.0)
Monocytes Relative: 6 %
NEUTROS PCT: 47 %
Neutro Abs: 2.8 10*3/uL (ref 1.7–7.7)
Platelets: 169 10*3/uL (ref 150–400)
RBC: 3.62 MIL/uL — AB (ref 3.87–5.11)
RDW: 14.5 % (ref 11.5–15.5)
WBC: 6 10*3/uL (ref 4.0–10.5)

## 2016-01-26 LAB — I-STAT TROPONIN, ED: TROPONIN I, POC: 0 ng/mL (ref 0.00–0.08)

## 2016-01-26 LAB — BASIC METABOLIC PANEL
Anion gap: 5 (ref 5–15)
BUN: 17 mg/dL (ref 6–20)
CHLORIDE: 104 mmol/L (ref 101–111)
CO2: 30 mmol/L (ref 22–32)
Calcium: 8.1 mg/dL — ABNORMAL LOW (ref 8.9–10.3)
Creatinine, Ser: 1.51 mg/dL — ABNORMAL HIGH (ref 0.44–1.00)
GFR calc non Af Amer: 35 mL/min — ABNORMAL LOW (ref >60)
GFR, EST AFRICAN AMERICAN: 41 mL/min — AB (ref >60)
Glucose, Bld: 92 mg/dL (ref 65–99)
POTASSIUM: 3.4 mmol/L — AB (ref 3.5–5.1)
SODIUM: 139 mmol/L (ref 135–145)

## 2016-01-26 MED ORDER — SODIUM CHLORIDE 0.9 % IV BOLUS (SEPSIS)
500.0000 mL | Freq: Once | INTRAVENOUS | Status: AC
  Administered 2016-01-26: 500 mL via INTRAVENOUS

## 2016-01-26 MED ORDER — ONDANSETRON HCL 4 MG/2ML IJ SOLN
4.0000 mg | Freq: Once | INTRAMUSCULAR | Status: AC | PRN
  Administered 2016-01-26: 4 mg via INTRAVENOUS
  Filled 2016-01-26: qty 2

## 2016-01-26 MED ORDER — ONDANSETRON 4 MG PO TBDP: 4.0000 mg | ORAL_TABLET | Freq: Three times a day (TID) | ORAL | Status: DC | PRN

## 2016-01-26 MED ORDER — SODIUM CHLORIDE 0.9 % IV BOLUS (SEPSIS): 1000.0000 mL | Freq: Once | INTRAVENOUS | Status: DC

## 2016-01-26 NOTE — ED Notes (Signed)
ICD (St. [name]) interrogation sent.

## 2016-01-26 NOTE — ED Notes (Signed)
Patient transported to X-ray 

## 2016-01-26 NOTE — ED Notes (Signed)
Per EMS, patient from home with c/o diarrhea and dizziness x2 days. Patient states she gets dizzy when she stands. No vomiting. Denies pain.

## 2016-01-26 NOTE — ED Notes (Signed)
Bed: WA20 Expected date: 01/26/16 Expected time:  Means of arrival:  Comments: 65 y/o female diarrhea X's 2 days/dizzy

## 2016-01-26 NOTE — ED Notes (Signed)
Pt returned to room  

## 2016-01-26 NOTE — ED Notes (Signed)
Patient c/o diarrhea x2 days, worse on the first day. Patient reports having diarrhea x1 episode in prior 24 hours. Patient states today she has not eaten, but did drink coffee without having any diarrhea. Patient states she does feel slightly nauseated.

## 2016-01-26 NOTE — ED Provider Notes (Signed)
CSN: [id number]     Arrival date & time 01/26/16  1358 History   First MD Initiated Contact with Patient 01/26/16 1420     Chief Complaint  Patient presents with  . Dizziness  . Diarrhea     (Consider location/radiation/quality/duration/timing/severity/associated sxs/prior Treatment) HPI Lori Beard is a 65 y.o. female with a history of V. tach, nonischemic cardiomyopathy, CHF and ICD here for evaluation of dizziness and diarrhea. Patient reports over the past 2 days, she has felt more dizzy and seemed black spots when she stands up from a seated or lying position. This sensation resolves after a few minutes. Denies any other headache, focal numbness or weakness, chest pain, shortness of breath, orthopnea or PND. Secondarily, she reports diarrhea, characterized this one loose stool yesterday and one loose stool today, no melena or hematochezia. No abdominal pain, urinary symptoms, back pain. She does report that she "drinks more coffee than water" and the possibility that she is dehydrated may exist. Nothing makes her problem better or worse. No other modifying factors.  Past Medical History  Diagnosis Date  . Hypertension   . Morbid obesity (HCC)     a. s/p lapband surgery 11/2012.  [name] GERD (gastroesophageal reflux disease)   . Arthritis     ALL OVER  . Sleep apnea 02/15/2012    SLEEP STUDY IN EPIC - MILD OSA-CPAP NOT RECOMMENDED-HOME OXYGEN SUGGESTED BECAUSE OF OXYGEN DESATS ON ROOM AIR.  [name] Cardiac arrest (HCC) 08/01/2013    Polymorphic VT  . NICM (nonischemic cardiomyopathy) (HCC)     Echocardiogram (08/01/13): Mild LVH, global HK, EF 40-45%, Gr 1 DD, MAC, mild RVE, mildly reduced RVSF, PASP 31-35.  LHC (08/02/13):  Normal coronary arteries, EF 35%, LVEDP 17.  . Chronic systolic CHF (congestive heart failure) (HCC)   . Ventricular tachycardia (HCC)     a. R on T PVC during admission for pneumonia/NSTEMI => monomorphic VT=>VF=>defib; s/p ICD  . S/P implantation of automatic  cardioverter/defibrillator (AICD)     07/2013 [name])  . Myocardial infarction [facility]) 08/06/13   Past Surgical History  Procedure Laterality Date  . Abdominal hysterectomy    . Knee arthroscopy    . Cholecystectomy    . Breath tek h pylori  02/15/2012    Procedure: BREATH TEK H PYLORI;  Surgeon: Valarie Merino, MD;  Location: [facility];  Service: General;  Laterality: N/A;  745  . Joint replacement      BILATERAL TOTAL HIP REPLACEMENTS  . Mesh applied to lap port N/A 11/22/2012    Procedure: MESH APPLIED TO LAP PORT;  Surgeon: Valarie Merino, MD;  Location: [facility];  Service: General;  Laterality: N/A;  . Laparoscopic gastric banding    . Pacemaker insertion   08/04/2013    St. [name] single chamber ICD  . Cardiac catheterization  08/02/2013    Normal coronary arteries, EF 35%   . Left heart catheterization with coronary angiogram N/A 08/02/2013    Procedure: LEFT HEART CATHETERIZATION WITH CORONARY ANGIOGRAM;  Surgeon: Iran Ouch, MD;  Location: [facility] CATH [facility];  Service: Cardiovascular;  Laterality: N/A;  . Implantable cardioverter defibrillator implant N/A 08/04/2013    Procedure: IMPLANTABLE CARDIOVERTER DEFIBRILLATOR IMPLANT;  Surgeon: Marinus Maw, MD;  Location: [facility] CATH LAB;  Service: Cardiovascular;  Laterality: N/A;   Family History  Problem Relation Age of Onset  . Kidney disease Mother   . Heart disease Father   . Cancer Father     colon  . Cancer  Sister     breast  . Heart attack Neg Hx   . Stroke Paternal Grandmother   . Hypertension Father   . Hypertension Paternal Grandmother    Social History  Substance Use Topics  . Smoking status: Never Smoker   . Smokeless tobacco: Never Used  . Alcohol Use: No   OB History    No data available     Review of Systems A 10 point review of systems was completed and was negative except for pertinent positives and negatives as mentioned in the history of present illness     Allergies  Ace inhibitors; Iron; and  Morphine and related  Home Medications   Prior to Admission medications   Medication Sig Start Date End Date Taking? Authorizing Provider  amitriptyline (ELAVIL) 25 MG tablet Take 75 mg by mouth at bedtime. 04/12/12  Yes Ryan M Dunn, PA-C  aspirin 81 MG tablet Take 1 tablet (81 mg total) by mouth every evening. 10/06/13  Yes Vesta Mixer, MD  atorvastatin (LIPITOR) 10 MG tablet TAKE 1 TABLET BY MOUTH ONCE DAILY AT 6 PM Patient taking differently: TAKE 10 MG  BY MOUTH ONCE DAILY AT 6 PM 10/31/15  Yes Vesta Mixer, MD  carvedilol (COREG) 12.5 MG tablet TAKE 2 TABLETS BY MOUTH TWICE DAILY WITH MEALS **NEED TO SCHEDULE APPOINTMENT** Patient taking differently: TAKE 25 MG BY MOUTH TWICE DAILY WITH MEALS **NEED TO SCHEDULE APPOINTMENT** 10/31/15  Yes Vesta Mixer, MD  furosemide (LASIX) 40 MG tablet TAKE 1 TABLET BY MOUTH EVERY MORNING. Patient taking differently: TAKE40 MG BY MOUTH EVERY MORNING. 10/03/15  Yes Vesta Mixer, MD  hydrALAZINE (APRESOLINE) 25 MG tablet TAKE 1 TABLET BY MOUTH 3 TIMES DAILY. Patient taking differently: TAKE 25 MG  BY MOUTH TWICE DAILY 10/31/15  Yes Vesta Mixer, MD  HYDROcodone-acetaminophen (NORCO) 7.5-325 MG tablet Take 1 tablet by mouth every 8 (eight) hours as needed for moderate pain or severe pain.  01/07/16  Yes Historical Provider, MD  hydrOXYzine (ATARAX/VISTARIL) 25 MG tablet Take 1 tablet (25 mg total) by mouth every 6 (six) hours. Patient taking differently: Take 25 mg by mouth every 6 (six) hours as needed for itching.  10/22/15  Yes Ace Gins Sam, PA-C  isosorbide dinitrate (ISORDIL) 20 MG tablet Take 20 mg by mouth 2 (two) times daily.   Yes Historical Provider, MD  losartan (COZAAR) 50 MG tablet Take 1 tablet (50 mg total) by mouth daily. 11/12/15  Yes Beatrice Lecher, PA-C  Multiple Vitamin (MULTIVITAMIN) tablet Take 1 tablet by mouth daily.   Yes Historical Provider, MD  pantoprazole (PROTONIX) 40 MG tablet Take 40 mg by mouth every morning.    Yes  Historical Provider, MD  potassium chloride SA (K-DUR,KLOR-CON) 20 MEQ tablet TAKE 2 TABLETS BY MOUTH DAILY. Patient taking differently: TAKE 20 MEQ BY MOUTH DAILY. 04/04/15  Yes Vesta Mixer, MD  Triamcinolone Acetonide (TRIAMCINOLONE 0.1 % CREAM : EUCERIN) CREA Apply 1 application topically 2 (two) times daily. 10/22/15  Yes Ace Gins Sam, PA-C  acetaminophen (TYLENOL) 325 MG tablet Take 1-2 tablets (325-650 mg total) by mouth every 4 (four) hours as needed for mild pain. Patient not taking: Reported on 01/26/2016 08/05/13   Christiane Ha, MD   BP 102/72 mmHg  Pulse 58  Temp(Src) 97.7 F (36.5 C) (Oral)  Resp 18  Ht 5' 3.5" (1.613 m)  Wt 96.163 kg  BMI 36.96 kg/m2  SpO2 95% Physical Exam  Constitutional: She is  oriented to person, place, and time. She appears well-developed and well-nourished.  Overall well-appearing after American female  HENT:  Head: Normocephalic and atraumatic.  Mouth/Throat: Oropharynx is clear and moist.  Eyes: Conjunctivae are normal. Pupils are equal, round, and reactive to light. Right eye exhibits no discharge. Left eye exhibits no discharge. No scleral icterus.  Neck: Neck supple.  Cardiovascular: Normal rate, regular rhythm and normal heart sounds.   Pulmonary/Chest: Effort normal and breath sounds normal. No respiratory distress. She has no wheezes. She has no rales.  Abdominal: Soft. There is no tenderness.  Musculoskeletal: She exhibits no tenderness.  Neurological: She is alert and oriented to person, place, and time.  Cranial Nerves II-XII grossly intact  Skin: Skin is warm and dry. No rash noted.  Psychiatric: She has a normal mood and affect.  Nursing note and vitals reviewed.   ED Course  Procedures (including critical care time) Labs Review Labs Reviewed  CBC WITH DIFFERENTIAL/PLATELET  BASIC METABOLIC PANEL  CBC WITH DIFFERENTIAL/PLATELET  I-STAT TROPOININ, ED    Imaging Review No results found. I have personally reviewed and  evaluated these images and lab results as part of my medical decision-making.   EKG Interpretation   Date/Time:  Sunday Jan 26 2016 14:53:52 EDT Ventricular Rate:  54 PR Interval:    QRS Duration: 116 QT Interval:  409 QTC Calculation: 388 R Axis:   38 Text Interpretation:  Junctional rhythm Nonspecific intraventricular  conduction delay Baseline wander in lead(s) V1 Rate decreased from  previous tracing Confirmed by NGUYEN, EMILY ([id number]) on 01/26/2016 3:11:01  PM     Filed Vitals:   01/26/16 1402 01/26/16 1732  BP: 102/72 126/83  Pulse: 58 80  Temp: 97.7 F (36.5 C)   TempSrc: Oral   Resp: 18 16  Height: 5' 3.5" (1.613 m)   Weight: 96.163 kg   SpO2: 95% 99%    MDM  DANIESHA DRIVER is a 65 y.o. female here for evaluation of dizziness. Symptoms sound consistent with orthostasis. Patient does admit to drinking more coffee and water, suspect some element of dehydration. She does have CHF, but there is no evidence of fluid overload. She does have mildly dry mucous membranes. Plan to administer bolus 500 mL's and reassess. She denies any other discomfort at this time. Due to patient's extensive cardiac history, we will obtain troponin, chest x-ray and will interrogate her device. She is ambulating to and from the bathroom without any discomfort or dizziness. Discussed with my attending, Dr. Cyndie Chime Patient will be signed out to oncoming provider, Lori Beard, for follow-up on labs, imaging and reassessment. If no new objective findings, patient discharged home to follow-up with her PCP.  Final diagnoses:  Dizziness  Diarrhea, unspecified type  Dehydration       Joycie Peek, PA-C 01/28/16 1308  Leta Baptist, MD 02/02/16 [phone]

## 2016-01-26 NOTE — Discharge Instructions (Signed)
You have been seen today for dizziness and diarrhea. Your imaging and lab tests showed no acute abnormalities. It is suspected your dizziness is due to dehydration from your diarrhea. At this point, your diarrhea is consistent with a viral illness. Viruses do not require antibiotics. Treatment is symptomatic care. Drink plenty of fluids and get plenty of rest. You should be drinking at least half a liter of water an hour to stay hydrated. Ibuprofen or Tylenol for pain or fever. Zofran for nausea. Follow up with PCP as soon as possible for reevaluation. Return to ED should symptoms worsen.

## 2016-01-26 NOTE — ED Provider Notes (Signed)
Lori Beard is a 65 y.o. female, presenting to the ED with dizziness and diarrhea over the last 2 days. Dizziness occurs upon standing. Endorses poor oral fluid intake.    Joycie Peek, PA-C's HPI: "[name] is a 65 y.o. female with a history of V. tach, nonischemic cardiomyopathy, CHF and ICD here for evaluation of dizziness and diarrhea. Patient reports over the past 2 days, she has felt more dizzy and seemed black spots when she stands up from a seated or lying position. This sensation resolves after a few minutes. Denies any other headache, focal numbness or weakness, chest pain, shortness of breath, orthopnea or PND. Secondarily, she reports diarrhea, characterized this one loose stool yesterday and one loose stool today, no melena or hematochezia. No abdominal pain, urinary symptoms, back pain. She does report that she "drinks more coffee than water" and the possibility that she is dehydrated may exist. Nothing makes her problem better or worse. No other modifying factors."   Past Medical History  Diagnosis Date  . Hypertension   . Morbid obesity (HCC)     a. s/p lapband surgery 11/2012.  [name] GERD (gastroesophageal reflux disease)   . Arthritis     ALL OVER  . Sleep apnea 02/15/2012    SLEEP STUDY IN EPIC - MILD OSA-CPAP NOT RECOMMENDED-HOME OXYGEN SUGGESTED BECAUSE OF OXYGEN DESATS ON ROOM AIR.  [name] Cardiac arrest (HCC) 08/01/2013    Polymorphic VT  . NICM (nonischemic cardiomyopathy) (HCC)     Echocardiogram (08/01/13): Mild LVH, global HK, EF 40-45%, Gr 1 DD, MAC, mild RVE, mildly reduced RVSF, PASP 31-35.  LHC (08/02/13):  Normal coronary arteries, EF 35%, LVEDP 17.  . Chronic systolic CHF (congestive heart failure) (HCC)   . Ventricular tachycardia (HCC)     a. R on T PVC during admission for pneumonia/NSTEMI => monomorphic VT=>VF=>defib; s/p ICD  . S/P implantation of automatic cardioverter/defibrillator (AICD)     07/2013 [name])  . Myocardial infarction (HCC)  08/06/13      Physical Exam  BP 102/72 mmHg  Pulse 58  Temp(Src) 97.7 F (36.5 C) (Oral)  Resp 18  Ht 5' 3.5" (1.613 m)  Wt 96.163 kg  BMI 36.96 kg/m2  SpO2 95%  Physical Exam  Constitutional: She appears well-developed and well-nourished. No distress.  HENT:  Head: Normocephalic and atraumatic.  Eyes: Conjunctivae are normal. Pupils are equal, round, and reactive to light.  Neck: Neck supple.  Cardiovascular: Normal rate, regular rhythm, normal heart sounds and intact distal pulses.   Pulmonary/Chest: Effort normal and breath sounds normal. No respiratory distress.  Abdominal: Soft. There is no tenderness. There is no guarding.  Musculoskeletal: She exhibits no edema or tenderness.  Lymphadenopathy:    She has no cervical adenopathy.  Neurological: She is alert.  Skin: Skin is warm and dry. She is not diaphoretic.  Psychiatric: She has a normal mood and affect. Her behavior is normal.  Nursing note and vitals reviewed.    Results for orders placed or performed during the hospital encounter of 01/26/16  Basic metabolic panel  Result Value Ref Range   Sodium 139 135 - 145 mmol/L   Potassium 3.4 (L) 3.5 - 5.1 mmol/L   Chloride 104 101 - 111 mmol/L   CO2 30 22 - 32 mmol/L   Glucose, Bld 92 65 - 99 mg/dL   BUN 17 6 - 20 mg/dL   Creatinine, Ser 1.61 (H) 0.44 - 1.00 mg/dL   Calcium 8.1 (L) 8.9 - 10.3 mg/dL  GFR calc non Af Amer 35 (L) >60 mL/min   GFR calc Af Amer 41 (L) >60 mL/min   Anion gap 5 5 - 15  CBC with Differential/Platelet  Result Value Ref Range   WBC 6.0 4.0 - 10.5 K/uL   RBC 3.62 (L) 3.87 - 5.11 MIL/uL   Hemoglobin 9.4 (L) 12.0 - 15.0 g/dL   HCT 70.2 (L) 63.7 - 85.8 %   MCV 86.2 78.0 - 100.0 fL   MCH 26.0 26.0 - 34.0 pg   MCHC 30.1 30.0 - 36.0 g/dL   RDW 85.0 27.7 - 41.2 %   Platelets 169 150 - 400 K/uL   Neutrophils Relative % 47 %   Neutro Abs 2.8 1.7 - 7.7 K/uL   Lymphocytes Relative 43 %   Lymphs Abs 2.6 0.7 - 4.0 K/uL   Monocytes Relative 6  %   Monocytes Absolute 0.4 0.1 - 1.0 K/uL   Eosinophils Relative 3 %   Eosinophils Absolute 0.2 0.0 - 0.7 K/uL   Basophils Relative 1 %   Basophils Absolute 0.0 0.0 - 0.1 K/uL  I-stat troponin, ED  Result Value Ref Range   Troponin i, poc 0.00 0.00 - 0.08 ng/mL   Comment 3           Dg Chest 2 View  01/26/2016  CLINICAL DATA:  Nausea, vomiting and diarrhea for 2 days. Dizziness. EXAM: CHEST  2 VIEW COMPARISON:  Chest x-ray dated 08/05/2013. FINDINGS: Heart size is upper normal, stable. Overall cardiomediastinal silhouette is stable in size and configuration. Left chest wall pacemaker/ICD in place. Lungs are clear. No evidence of pneumonia. No pleural effusion or pneumothorax seen. Mild degenerative change again seen within the thoracic spine. No acute -appearing osseous abnormality IMPRESSION: Lungs are clear and there is no evidence of acute cardiopulmonary abnormality. Electronically Signed   By: Bary Richard M.D.   On: 01/26/2016 17:17     Orthostatic VS for the past 24 hrs:  BP- Lying Pulse- Lying BP- Sitting Pulse- Sitting BP- Standing at 0 minutes Pulse- Standing at 0 minutes  01/26/16 1732 126/83 mmHg 75 129/82 mmHg 56 113/78 mmHg 67      ED Course  Procedures  MDM  [name] presents with diarrhea and dizziness upon standing for the last 2 days.  4:16 PM Received patient care handoff report from [facility], PA-C. Plan: If no abnormalities on labs, discharge with instructions for PCP follow up and oral hydration. Dehydration with orthostasis suspected. No acute abnormalities on the patient's lab. No red flag symptoms. Patient's dizziness with position change resolved after IV fluids. No orthostatic changes following fluid administration. No episodes of diarrhea or loose stools here in the ED. Patient ambulated without difficulty without assistance. Patient to follow up with PCP as soon as possible for reevaluation. Given instructions for oral fluid hydration.  Patient voiced understanding of these instructions, accepts the plan, and is comfortable with discharge. Patient appears safe for discharge at this time.  Filed Vitals:   01/26/16 1402 01/26/16 1732  BP: 102/72 126/83  Pulse: 58 80  Temp: 97.7 F (36.5 C)   TempSrc: Oral   Resp: 18 16  Height: 5' 3.5" (1.613 m)   Weight: 96.163 kg   SpO2: 95% 99%     Anselm Pancoast, PA-C 01/26/16 2135  Lavera Guise, MD 01/27/16 [phone]

## 2016-01-28 ENCOUNTER — Other Ambulatory Visit: Payer: Self-pay | Admitting: Cardiovascular Disease

## 2016-01-28 MED FILL — LOSARTAN POTASSIUM 50 MG TA: 50 | 90 days supply | Qty: 90 | Fill #1

## 2016-01-28 MED FILL — ATORVASTATIN 10 MG TABLET: 10 | 90 days supply | Qty: 90 | Fill #1

## 2016-01-30 MED FILL — KLOR-CON M20 TABLET: 20 | 90 days supply | Qty: 180 | Fill #0

## 2016-02-13 LAB — CUP PACEART REMOTE DEVICE CHECK
HighPow Impedance: 73 Ohm
HighPow Impedance: 73 Ohm
Implantable Lead Location: 753860
Implantable Lead Model: 7122
Lead Channel Sensing Intrinsic Amplitude: 10.2 mV
Lead Channel Setting Pacing Amplitude: 2.5 V
Lead Channel Setting Pacing Pulse Width: 0.5 ms
Lead Channel Setting Sensing Sensitivity: 0.5 mV
MDC IDC LEAD IMPLANT DT: 20141128
MDC IDC MSMT BATTERY REMAINING LONGEVITY: 82 mo
MDC IDC MSMT BATTERY REMAINING PERCENTAGE: 80 %
MDC IDC MSMT BATTERY VOLTAGE: 2.98 V
MDC IDC MSMT LEADCHNL RV IMPEDANCE VALUE: 440 Ohm
MDC IDC MSMT LEADCHNL RV PACING THRESHOLD AMPLITUDE: 0.75 V
MDC IDC MSMT LEADCHNL RV PACING THRESHOLD PULSEWIDTH: 0.5 ms
MDC IDC SESS DTM: 20170516080016
MDC IDC STAT BRADY RV PERCENT PACED: 1 %
Pulse Gen Serial Number: 7136396

## 2016-02-20 ENCOUNTER — Encounter: Payer: Self-pay | Admitting: Cardiology

## 2016-02-26 MED FILL — CARVEDILOL 12.5 MG TABLET: 12.5 | 60 days supply | Qty: 240 | Fill #2

## 2016-03-12 DIAGNOSIS — J329 Chronic sinusitis, unspecified: Secondary | ICD-10-CM | POA: Diagnosis not present

## 2016-03-12 MED FILL — FUROSEMIDE 40 MG TABLET: 40 | 90 days supply | Qty: 90 | Fill #2

## 2016-03-12 MED FILL — PANTOPRAZOLE SOD DR 40 MG T: 40 | 90 days supply | Qty: 90 | Fill #1

## 2016-03-12 MED FILL — SULFAMETHOXAZOLE/TMP DS TAB: 800-160 | 10 days supply | Qty: 20 | Fill #0

## 2016-03-26 MED FILL — ISOSORBIDE DN 20 MG TABLET: 20 | 90 days supply | Qty: 270 | Fill #1

## 2016-03-26 MED FILL — hydrALAZINE HCL 25 MG TABS: 25 | 90 days supply | Qty: 270 | Fill #1

## 2016-03-30 MED FILL — AMITRIPTYLINE HCL 25 MG TAB: 25 | 90 days supply | Qty: 270 | Fill #0

## 2016-04-09 DIAGNOSIS — Z9884 Bariatric surgery status: Secondary | ICD-10-CM | POA: Diagnosis not present

## 2016-04-09 MED FILL — HYDROCODON-APAP 7.5-325: 7.5-325 | 22 days supply | Qty: 90 | Fill #0

## 2016-04-21 ENCOUNTER — Ambulatory Visit (INDEPENDENT_AMBULATORY_CARE_PROVIDER_SITE_OTHER): Payer: 59 | Admitting: *Deleted

## 2016-04-21 DIAGNOSIS — I429 Cardiomyopathy, unspecified: Secondary | ICD-10-CM | POA: Diagnosis not present

## 2016-04-21 DIAGNOSIS — I428 Other cardiomyopathies: Secondary | ICD-10-CM

## 2016-04-21 DIAGNOSIS — I5022 Chronic systolic (congestive) heart failure: Secondary | ICD-10-CM

## 2016-04-21 NOTE — Progress Notes (Signed)
Remote ICD transmission.   

## 2016-04-22 ENCOUNTER — Encounter: Payer: Self-pay | Admitting: Cardiology

## 2016-04-24 LAB — CUP PACEART REMOTE DEVICE CHECK
Brady Statistic RV Percent Paced: 1 %
HighPow Impedance: 73 Ohm
HighPow Impedance: 73 Ohm
Implantable Lead Implant Date: 20141128
Implantable Lead Location: 753860
Lead Channel Pacing Threshold Amplitude: 0.75 V
Lead Channel Pacing Threshold Pulse Width: 0.5 ms
Lead Channel Setting Pacing Amplitude: 2.5 V
Lead Channel Setting Pacing Pulse Width: 0.5 ms
Lead Channel Setting Sensing Sensitivity: 0.5 mV
MDC IDC MSMT BATTERY REMAINING LONGEVITY: 79 mo
MDC IDC MSMT BATTERY REMAINING PERCENTAGE: 77 %
MDC IDC MSMT BATTERY VOLTAGE: 2.98 V
MDC IDC MSMT LEADCHNL RV IMPEDANCE VALUE: 430 Ohm
MDC IDC MSMT LEADCHNL RV SENSING INTR AMPL: 10 mV
MDC IDC PG SERIAL: 7136396
MDC IDC SESS DTM: 20170815102115

## 2016-04-27 ENCOUNTER — Other Ambulatory Visit: Payer: Self-pay | Admitting: Cardiovascular Disease

## 2016-04-27 MED FILL — LOSARTAN POTASSIUM 50 MG TA: 50 | 90 days supply | Qty: 90 | Fill #2

## 2016-04-28 MED FILL — ATORVASTATIN 10 MG TABLET: 10 | 90 days supply | Qty: 90 | Fill #0

## 2016-05-01 ENCOUNTER — Other Ambulatory Visit: Payer: Self-pay | Admitting: *Deleted

## 2016-05-01 MED ORDER — CARVEDILOL 12.5 MG PO TABS: ORAL_TABLET | ORAL | 1 refills | Status: DC

## 2016-05-01 MED FILL — CARVEDILOL 12.5 MG TABLET: 12.5 | 90 days supply | Qty: 360 | Fill #0

## 2016-06-02 MED FILL — FUROSEMIDE 40 MG TABLET: 40 | 30 days supply | Qty: 30 | Fill #3

## 2016-06-04 DIAGNOSIS — Z23 Encounter for immunization: Secondary | ICD-10-CM | POA: Diagnosis not present

## 2016-06-12 MED FILL — PANTOPRAZOLE SOD DR 40 MG T: 40 | 90 days supply | Qty: 90 | Fill #2

## 2016-06-18 MED FILL — AMITRIPTYLINE HCL 25 MG TAB: 25 | 90 days supply | Qty: 270 | Fill #1

## 2016-06-29 MED FILL — FUROSEMIDE 40 MG TABLET: 40 | 30 days supply | Qty: 30 | Fill #4

## 2016-07-16 MED FILL — HYDROCODON-APAP 7.5-325: 7.5-325 | 23 days supply | Qty: 90 | Fill #0

## 2016-07-31 MED FILL — ATORVASTATIN 10 MG TABLET: 10 | 90 days supply | Qty: 90 | Fill #1

## 2016-08-04 MED FILL — LOSARTAN POTASSIUM 50 MG TA: 50 | 90 days supply | Qty: 90 | Fill #3

## 2016-08-07 MED FILL — CARVEDILOL 12.5 MG TABLET: 12.5 | 90 days supply | Qty: 360 | Fill #1

## 2016-08-12 MED FILL — KLOR-CON M20 TABLET: 20 | 90 days supply | Qty: 180 | Fill #1

## 2016-08-13 ENCOUNTER — Encounter: Payer: Self-pay | Admitting: *Deleted

## 2016-08-13 MED FILL — FUROSEMIDE 40 MG TABLET: 40 | 30 days supply | Qty: 30 | Fill #5

## 2016-08-18 ENCOUNTER — Encounter: Payer: Self-pay | Admitting: Internal Medicine

## 2016-08-18 ENCOUNTER — Ambulatory Visit (INDEPENDENT_AMBULATORY_CARE_PROVIDER_SITE_OTHER): Payer: 59 | Admitting: Internal Medicine

## 2016-08-18 ENCOUNTER — Encounter (INDEPENDENT_AMBULATORY_CARE_PROVIDER_SITE_OTHER): Payer: Self-pay

## 2016-08-18 VITALS — BP 122/82 | HR 66 | Ht 63.5 in | Wt 209.2 lb

## 2016-08-18 DIAGNOSIS — I5022 Chronic systolic (congestive) heart failure: Secondary | ICD-10-CM | POA: Diagnosis not present

## 2016-08-18 DIAGNOSIS — Z95810 Presence of automatic (implantable) cardiac defibrillator: Secondary | ICD-10-CM | POA: Diagnosis not present

## 2016-08-18 DIAGNOSIS — I25119 Atherosclerotic heart disease of native coronary artery with unspecified angina pectoris: Secondary | ICD-10-CM

## 2016-08-18 LAB — CUP PACEART INCLINIC DEVICE CHECK
HighPow Impedance: 67.5 Ohm
Implantable Lead Implant Date: 20141128
Implantable Pulse Generator Implant Date: 20141128
Lead Channel Impedance Value: 462.5 Ohm
Lead Channel Pacing Threshold Amplitude: 0.75 V
Lead Channel Sensing Intrinsic Amplitude: 12 mV
Lead Channel Setting Pacing Amplitude: 2.5 V
Lead Channel Setting Pacing Pulse Width: 0.5 ms
Lead Channel Setting Sensing Sensitivity: 0.5 mV
MDC IDC LEAD LOCATION: 753860
MDC IDC MSMT LEADCHNL RV PACING THRESHOLD PULSEWIDTH: 0.5 ms
MDC IDC SESS DTM: 20171212120220
MDC IDC STAT BRADY RV PERCENT PACED: 0 %
Pulse Gen Serial Number: 7136396

## 2016-08-18 NOTE — Progress Notes (Signed)
HPI [name] returns today for followup. She is a pleasant 65 yo woman with non-ischemic CM, chronic systolic heart failure, VF arrest and HTN. She has done well in the interim. No chest pain or sob. No syncope or ICD shock. She notes some anxiety when she eats. She will feel occaissional palpitations. Allergies  Allergen Reactions  . Ace Inhibitors Cough  . Iron Other (See Comments)    Gives patient migraine headaches when supplemented  . Morphine And Related Itching and Rash     Current Outpatient Prescriptions  Medication Sig Dispense Refill  . amitriptyline (ELAVIL) 25 MG tablet Take 75 mg by mouth at bedtime.    [name] aspirin 81 MG tablet Take 1 tablet (81 mg total) by mouth every evening.    [name] atorvastatin (LIPITOR) 10 MG tablet TAKE 1 TABLET BY MOUTH ONCE DAILY AT 6 PM 90 tablet 2  . carvedilol (COREG) 12.5 MG tablet TAKE 2 TABLETS BY MOUTH TWICE DAILY WITH MEALS 360 tablet 1  . furosemide (LASIX) 40 MG tablet TAKE 1 TABLET BY MOUTH EVERY MORNING. 90 tablet 3  . hydrALAZINE (APRESOLINE) 25 MG tablet TAKE 1 TABLET BY MOUTH 3 TIMES DAILY. (Patient taking differently: TAKE 25 MG  BY MOUTH TWICE DAILY) 270 tablet 1  . HYDROcodone-acetaminophen (NORCO) 7.5-325 MG tablet Take 1 tablet by mouth every 8 (eight) hours as needed for moderate pain or severe pain.   0  . hydrOXYzine (ATARAX/VISTARIL) 25 MG tablet Take 1 tablet (25 mg total) by mouth every 6 (six) hours. (Patient taking differently: Take 25 mg by mouth every 6 (six) hours as needed for itching. ) 12 tablet 0  . isosorbide dinitrate (ISORDIL) 20 MG tablet Take 20 mg by mouth 2 (two) times daily.    [name] losartan (COZAAR) 50 MG tablet Take 1 tablet (50 mg total) by mouth daily. 90 tablet 3  . Multiple Vitamin (MULTIVITAMIN) tablet Take 1 tablet by mouth daily.    . pantoprazole (PROTONIX) 40 MG tablet Take 40 mg by mouth every morning.     . potassium chloride SA (K-DUR,KLOR-CON) 20 MEQ tablet TAKE 2 TABLETS BY MOUTH DAILY.  180 tablet 2  . Triamcinolone Acetonide (TRIAMCINOLONE 0.1 % CREAM : EUCERIN) CREA Apply 1 application topically 2 (two) times daily. 1 each 0   No current facility-administered medications for this visit.      Past Medical History:  Diagnosis Date  . Arthritis    ALL OVER  . Cardiac arrest (HCC) 08/01/2013   Polymorphic VT  . Chronic systolic CHF (congestive heart failure) (HCC)   . GERD (gastroesophageal reflux disease)   . Hypertension   . Morbid obesity (HCC)    a. s/p lapband surgery 11/2012.  [name] Myocardial infarction 08/06/13  . NICM (nonischemic cardiomyopathy) (HCC)    Echocardiogram (08/01/13): Mild LVH, global HK, EF 40-45%, Gr 1 DD, MAC, mild RVE, mildly reduced RVSF, PASP 31-35.  LHC (08/02/13):  Normal coronary arteries, EF 35%, LVEDP 17.  . S/P implantation of automatic cardioverter/defibrillator (AICD)    07/2013 [name])  . Sleep apnea 02/15/2012   SLEEP STUDY IN EPIC - MILD OSA-CPAP NOT RECOMMENDED-HOME OXYGEN SUGGESTED BECAUSE OF OXYGEN DESATS ON ROOM AIR.  [name] Ventricular tachycardia (HCC)    a. R on T PVC during admission for pneumonia/NSTEMI => monomorphic VT=>VF=>defib; s/p ICD    ROS:   All systems reviewed and negative except as noted in the HPI.   Past Surgical History:  Procedure Laterality Date  .  ABDOMINAL HYSTERECTOMY    . BREATH TEK H PYLORI  02/15/2012   Procedure: BREATH TEK H PYLORI;  Surgeon: Valarie MerinoMatthew B Martin, MD;  [facility];  Service: General;  Laterality: N/A;  745  . CARDIAC CATHETERIZATION  08/02/2013   Normal coronary arteries, EF 35%   . CHOLECYSTECTOMY    . IMPLANTABLE CARDIOVERTER DEFIBRILLATOR IMPLANT N/A 08/04/2013   Procedure: IMPLANTABLE CARDIOVERTER DEFIBRILLATOR IMPLANT;  Surgeon: Marinus MawGregg W Joan Herschberger, MD;  [facility];  Service: Cardiovascular;  Laterality: N/A;  . JOINT REPLACEMENT     BILATERAL TOTAL HIP REPLACEMENTS  . KNEE ARTHROSCOPY    . LAPAROSCOPIC GASTRIC BANDING    . LEFT HEART CATHETERIZATION WITH  CORONARY ANGIOGRAM N/A 08/02/2013   Procedure: LEFT HEART CATHETERIZATION WITH CORONARY ANGIOGRAM;  Surgeon: Iran OuchMuhammad A Arida, MD;  Location: [facility] C[facility] LAB;  Service: Cardiovascular;  Laterality: N/A;  . MESH APPLIED TO LAP PORT N/A 11/22/2012   Procedure: MESH APPLIED TO LAP PORT;  Surgeon: Valarie MerinoMatthew B Martin, MD;  Location: [facility];  Service: General;  Laterality: N/A;  . PACEMAKER INSERTION   08/04/2013   St. [name] single chamber ICD     Family History  Problem Relation Age of Onset  . Kidney disease Mother   . Heart disease Father   . Hypertension Father   . Colon cancer Father   . Stroke Paternal Grandmother   . Hypertension Paternal Grandmother   . Breast cancer Sister   . Heart attack Neg Hx      Social History   Social History  . Marital status: Married    Spouse name: N/A  . Number of children: N/A  . Years of education: N/A   Occupational History  . Not on file.   Social History Main Topics  . Smoking status: Never Smoker  . Smokeless tobacco: Never Used  . Alcohol use No  . Drug use: No  . Sexual activity: Not Currently   Other Topics Concern  . Not on file   Social History Narrative  . No narrative on file     BP 122/82 (BP Location: Left Arm, Patient Position: Sitting, Cuff Size: Large)   Pulse 66   Ht 5' 3.5" (1.613 m)   Wt 209 lb 4 oz (94.9 kg)   BMI 36.49 kg/m   Physical Exam:  Well appearing 65 yo woman, NAD HEENT: Unremarkable Neck:  6 cm JVD, no thyromegally Back:  No CVA tenderness Lungs:  Clear with no wheezes, well healed ICD incision. HEART:  Regular rate rhythm, no murmurs, no rubs, no clicks Abd:  soft, positive bowel sounds, no organomegally, no rebound, no guarding Ext:  2 plus pulses, no edema, no cyanosis, no clubbing Skin:  No rashes no nodules Neuro:  CN II through XII intact, motor grossly intact   DEVICE  Normal device function.  See PaceArt for details.    Assess/Plan: 1. VF - she has had no additional ventricular  arrhythmias in the interim. 2. ICD - her St. [name] ICD is working normally. Will recheck in several months. 3. Chronic systolic heart failure - her symptoms are class 2. She will continue her current meds.  Leonia ReevesGregg Lenville Hibberd,M.D.

## 2016-08-18 NOTE — Patient Instructions (Signed)
Medication Instructions:  Your physician recommends that you continue on your current medications as directed. Please refer to the Current Medication list given to you today.   Labwork: None Ordered   Testing/Procedures: None Ordered   Follow-Up: Your physician wants you to follow-up in: 1 year with Dr. Taylor. You will receive a reminder letter in the mail two months in advance. If you don't receive a letter, please call our office to schedule the follow-up appointment.  Remote monitoring is used to monitor your ICD from home. This monitoring reduces the number of office visits required to check your device to one time per year. It allows us to keep an eye on the functioning of your device to ensure it is working properly. You are scheduled for a device check from home on 11/17/16. You may send your transmission at any time that day. If you have a wireless device, the transmission will be sent automatically. After your physician reviews your transmission, you will receive a postcard with your next transmission date.     Any Other Special Instructions Will Be Listed Below (If Applicable).     If you need a refill on your cardiac medications before your next appointment, please call your pharmacy.   

## 2016-09-01 ENCOUNTER — Other Ambulatory Visit: Payer: Self-pay

## 2016-09-01 MED ORDER — HYDRALAZINE HCL 25 MG PO TABS: ORAL_TABLET | ORAL | 3 refills | Status: DC

## 2016-09-01 MED FILL — hydrALAZINE HCL 25 MG TABS: 25 | 90 days supply | Qty: 180 | Fill #0

## 2016-09-06 ENCOUNTER — Emergency Department (HOSPITAL_COMMUNITY): Payer: 59

## 2016-09-06 ENCOUNTER — Emergency Department (HOSPITAL_COMMUNITY)
Admission: EM | Admit: 2016-09-06 | Discharge: 2016-09-06 | Disposition: A | Discharge status: Home / Self Care | Payer: 59 | Attending: Emergency Medicine | Admitting: Emergency Medicine

## 2016-09-06 ENCOUNTER — Encounter (HOSPITAL_COMMUNITY): Payer: Self-pay | Admitting: Certified Nurse Midwife

## 2016-09-06 DIAGNOSIS — Z7982 Long term (current) use of aspirin: Secondary | ICD-10-CM | POA: Insufficient documentation

## 2016-09-06 DIAGNOSIS — R0602 Shortness of breath: Secondary | ICD-10-CM | POA: Diagnosis not present

## 2016-09-06 DIAGNOSIS — Z96643 Presence of artificial hip joint, bilateral: Secondary | ICD-10-CM | POA: Insufficient documentation

## 2016-09-06 DIAGNOSIS — I252 Old myocardial infarction: Secondary | ICD-10-CM | POA: Diagnosis not present

## 2016-09-06 DIAGNOSIS — I110 Hypertensive heart disease with heart failure: Secondary | ICD-10-CM | POA: Diagnosis not present

## 2016-09-06 DIAGNOSIS — I5022 Chronic systolic (congestive) heart failure: Secondary | ICD-10-CM | POA: Insufficient documentation

## 2016-09-06 DIAGNOSIS — Z950 Presence of cardiac pacemaker: Secondary | ICD-10-CM | POA: Diagnosis not present

## 2016-09-06 DIAGNOSIS — R05 Cough: Secondary | ICD-10-CM | POA: Diagnosis not present

## 2016-09-06 DIAGNOSIS — J209 Acute bronchitis, unspecified: Secondary | ICD-10-CM | POA: Insufficient documentation

## 2016-09-06 MED ORDER — HYDROCOD POLST-CPM POLST ER 10-8 MG/5ML PO SUER: 5.0000 mL | Freq: Every evening | ORAL | 0 refills | Status: DC | PRN

## 2016-09-06 MED ORDER — AZITHROMYCIN 250 MG PO TABS: 250.0000 mg | ORAL_TABLET | Freq: Every day | ORAL | 0 refills | Status: DC

## 2016-09-06 MED ORDER — AZITHROMYCIN 250 MG PO TABS
500.0000 mg | ORAL_TABLET | Freq: Once | ORAL | Status: AC
  Administered 2016-09-06: 500 mg via ORAL
  Filled 2016-09-06: qty 2

## 2016-09-06 MED ORDER — HYDROCOD POLST-CPM POLST ER 10-8 MG/5ML PO SUER
5.0000 mL | Freq: Once | ORAL | Status: AC
  Administered 2016-09-06: 5 mL via ORAL
  Filled 2016-09-06: qty 5

## 2016-09-06 NOTE — ED Triage Notes (Signed)
Pt states she has had a cough for 4 days and when she coughs, her ribs are painful. Pt states she is coughing up green sputum. Denies fever, chills, or SOB.

## 2016-09-06 NOTE — ED Provider Notes (Signed)
[facility]-EM[facility]Y DEPT Provider Note   CSN: [id number] Arrival date & time: 09/06/16  1053     History   Chief Complaint Chief Complaint  Patient presents with  . Cough  . Chest Pain    HPI Lori Beard is a 65 y.o. female.  Pt reports a cough for the past 4 days.  She said she's been coughing up green sputum.  Pt denies f/c.  Her ribs hurt from coughing.       Past Medical History:  Diagnosis Date  . Arthritis    ALL OVER  . Cardiac arrest (HCC) 08/01/2013   Polymorphic VT  . Chronic systolic CHF (congestive heart failure) (HCC)   . GERD (gastroesophageal reflux disease)   . Hypertension   . Morbid obesity (HCC)    a. s/p lapband surgery 11/2012.  [name] Myocardial infarction 08/06/13  . NICM (nonischemic cardiomyopathy) (HCC)    Echocardiogram (08/01/13): Mild LVH, global HK, EF 40-45%, Gr 1 DD, MAC, mild RVE, mildly reduced RVSF, PASP 31-35.  LHC (08/02/13):  Normal coronary arteries, EF 35%, LVEDP 17.  . S/P implantation of automatic cardioverter/defibrillator (AICD)    07/2013 [name])  . Sleep apnea 02/15/2012   SLEEP STUDY IN EPIC - MILD OSA-CPAP NOT RECOMMENDED-HOME OXYGEN SUGGESTED BECAUSE OF OXYGEN DESATS ON ROOM AIR.  [name] Ventricular tachycardia (HCC)    a. R on T PVC during admission for pneumonia/NSTEMI => monomorphic VT=>VF=>defib; s/p ICD    Patient Active Problem List   Diagnosis Date Noted  . Chronic systolic CHF (congestive heart failure) (HCC)   . S/P implantation of automatic cardioverter/defibrillator (AICD)   . NICM (nonischemic cardiomyopathy) (HCC) 08/03/2013  . Ventricular tachycardia arrest 08/02/2013  . Hypokalemia 08/02/2013  . Elevated troponin 08/02/2013  . Pneumonia 08/01/2013  . HTN (hypertension) 08/01/2013  . Anemia 08/01/2013  . Thrombocytopenia (HCC) 08/01/2013  . Renal failure, acute (HCC) 08/01/2013  . Lapband APS + hiatus hernia repair March 2014 11/23/2012  . Arthritis-bilateral hip replacements 11/10/2012  . Obesity  01/22/2012    Past Surgical History:  Procedure Laterality Date  . ABDOMINAL HYSTERECTOMY    . BREATH TEK H PYLORI  02/15/2012   Procedure: BREATH TEK H PYLORI;  Surgeon: Valarie Merino, MD;  Location: [facility];  Service: General;  Laterality: N/A;  745  . CARDIAC CATHETERIZATION  08/02/2013   Normal coronary arteries, EF 35%   . CHOLECYSTECTOMY    . IMPLANTABLE CARDIOVERTER DEFIBRILLATOR IMPLANT N/A 08/04/2013   Procedure: IMPLANTABLE CARDIOVERTER DEFIBRILLATOR IMPLANT;  Surgeon: Marinus Maw, MD;  Location: [facility] CATH LAB;  Service: Cardiovascular;  Laterality: N/A;  . JOINT REPLACEMENT     BILATERAL TOTAL HIP REPLACEMENTS  . KNEE ARTHROSCOPY    . LAPAROSCOPIC GASTRIC BANDING    . LEFT HEART CATHETERIZATION WITH CORONARY ANGIOGRAM N/A 08/02/2013   Procedure: LEFT HEART CATHETERIZATION WITH CORONARY ANGIOGRAM;  Surgeon: Iran Ouch, MD;  Location: [facility] CATH LAB;  Service: Cardiovascular;  Laterality: N/A;  . MESH APPLIED TO LAP PORT N/A 11/22/2012   Procedure: MESH APPLIED TO LAP PORT;  Surgeon: Valarie Merino, MD;  Location: [facility];  Service: General;  Laterality: N/A;  . PACEMAKER INSERTION   08/04/2013   St. [name] single chamber ICD    OB History    No data available       Home Medications    Prior to Admission medications   Medication Sig Start Date End Date Taking? Authorizing Provider  amitriptyline (ELAVIL) 25 MG tablet Take 75 mg  by mouth at bedtime. 04/12/12   Sondra Barges, PA-C  aspirin 81 MG tablet Take 1 tablet (81 mg total) by mouth every evening. 10/06/13   Vesta Mixer, MD  atorvastatin (LIPITOR) 10 MG tablet TAKE 1 TABLET BY MOUTH ONCE DAILY AT 6 PM 04/28/16   Vesta Mixer, MD  azithromycin (ZITHROMAX) 250 MG tablet Take 1 tablet (250 mg total) by mouth daily. Take 1 every day until finished. 09/06/16   Jacalyn Lefevre, MD  carvedilol (COREG) 12.5 MG tablet TAKE 2 TABLETS BY MOUTH TWICE DAILY WITH MEALS 05/01/16   Vesta Mixer, MD    chlorpheniramine-HYDROcodone [facility] PENNKINETIC ER) 10-8 MG/5ML SUER Take 5 mLs by mouth at bedtime as needed for cough. 09/06/16   Jacalyn Lefevre, MD  furosemide (LASIX) 40 MG tablet TAKE 1 TABLET BY MOUTH EVERY MORNING. 10/03/15   Vesta Mixer, MD  hydrALAZINE (APRESOLINE) 25 MG tablet TAKE 25 MG  BY MOUTH TWICE DAILY 09/01/16   Vesta Mixer, MD  HYDROcodone-acetaminophen (NORCO) 7.5-325 MG tablet Take 1 tablet by mouth every 8 (eight) hours as needed for moderate pain or severe pain.  01/07/16   Historical Provider, MD  hydrOXYzine (ATARAX/VISTARIL) 25 MG tablet Take 1 tablet (25 mg total) by mouth every 6 (six) hours. Patient taking differently: Take 25 mg by mouth every 6 (six) hours as needed for itching.  10/22/15   Ace Gins Sam, PA-C  isosorbide dinitrate (ISORDIL) 20 MG tablet Take 20 mg by mouth 2 (two) times daily.    Historical Provider, MD  losartan (COZAAR) 50 MG tablet Take 1 tablet (50 mg total) by mouth daily. 11/12/15   Beatrice Lecher, PA-C  Multiple Vitamin (MULTIVITAMIN) tablet Take 1 tablet by mouth daily.    Historical Provider, MD  pantoprazole (PROTONIX) 40 MG tablet Take 40 mg by mouth every morning.     Historical Provider, MD  potassium chloride SA (K-DUR,KLOR-CON) 20 MEQ tablet TAKE 2 TABLETS BY MOUTH DAILY. 01/30/16   Vesta Mixer, MD  Triamcinolone Acetonide (TRIAMCINOLONE 0.1 % CREAM : EUCERIN) CREA Apply 1 application topically 2 (two) times daily. 10/22/15   Carlene Coria, PA-C    Family History Family History  Problem Relation Age of Onset  . Kidney disease Mother   . Heart disease Father   . Hypertension Father   . Colon cancer Father   . Stroke Paternal Grandmother   . Hypertension Paternal Grandmother   . Breast cancer Sister   . Heart attack Neg Hx     Social History Social History  Substance Use Topics  . Smoking status: Never Smoker  . Smokeless tobacco: Never Used  . Alcohol use No     Allergies   Ace inhibitors; Iron; and Morphine  and related   Review of Systems Review of Systems  Respiratory: Positive for cough and shortness of breath.   All other systems reviewed and are negative.    Physical Exam Updated Vital Signs BP 102/78 (BP Location: Right Arm)   Pulse 66   Temp 98.6 F (37 C) (Oral)   Resp 20   Ht 5' 3.5" (1.613 m)   Wt 209 lb (94.8 kg)   SpO2 96%   BMI 36.44 kg/m   Physical Exam  Constitutional: She is oriented to person, place, and time. She appears well-developed and well-nourished.  HENT:  Head: Normocephalic and atraumatic.  Right Ear: External ear normal.  Left Ear: External ear normal.  Nose: Nose normal.  Mouth/Throat: Oropharynx  is clear and moist.  Eyes: Conjunctivae and EOM are normal. Pupils are equal, round, and reactive to light.  Neck: Normal range of motion. Neck supple.  Cardiovascular: Normal rate, regular rhythm, normal heart sounds and intact distal pulses.   Pulmonary/Chest: Effort normal and breath sounds normal.  Abdominal: Soft. Bowel sounds are normal.  Musculoskeletal: Normal range of motion.  Neurological: She is alert and oriented to person, place, and time.  Skin: Skin is warm.  Psychiatric: She has a normal mood and affect. Her behavior is normal. Judgment and thought content normal.  Nursing note and vitals reviewed.    ED Treatments / Results  Labs (all labs ordered are listed, but only abnormal results are displayed) Labs Reviewed - No data to display  EKG  EKG Interpretation None       Radiology Dg Chest 2 View  Result Date: 09/06/2016 CLINICAL DATA:  Patient with cough for 4 days. Painful ribs. No fever. EXAM: CHEST  2 VIEW COMPARISON:  Chest radiograph 01/26/2016. FINDINGS: Single lead AICD device overlies the left hemithorax, lead is stable in position. Stable enlarged cardiac and mediastinal contours with tortuosity of the thoracic aorta. Minimal heterogeneous opacities left lung base. No large area pulmonary consolidation. Mid thoracic  spine degenerative changes. Cholecystectomy clips. IMPRESSION: No acute cardiopulmonary process. Probable left basilar atelectasis. Electronically Signed   By: Annia Beltrew  Davis M.D.   On: 09/06/2016 14:46    Procedures Procedures (including critical care time)  Medications Ordered in ED Medications  azithromycin (ZITHROMAX) tablet 500 mg (not administered)  chlorpheniramine-HYDROcodone (TUSSIONEX) 10-8 MG/5ML suspension 5 mL (5 mLs Oral Given 09/06/16 1314)     Initial Impression / Assessment and Plan / ED Course  I have reviewed the triage vital signs and the nursing notes.  Pertinent labs & imaging results that were available during my care of the patient were reviewed by me and considered in my medical decision making (see chart for details).  Clinical Course     Sx have been going on for almost 1 week, so she will be started on zithromax.  Sx have improved with tussionex.  Pt knows to return if worse.  Final Clinical Impressions(s) / ED Diagnoses   Final diagnoses:  Acute bronchitis, unspecified organism    New Prescriptions New Prescriptions   AZITHROMYCIN (ZITHROMAX) 250 MG TABLET    Take 1 tablet (250 mg total) by mouth daily. Take 1 every day until finished.   CHLORPHENIRAMINE-HYDROCODONE (TUSSIONEX PENNKINETIC ER) 10-8 MG/5ML SUER    Take 5 mLs by mouth at bedtime as needed for cough.     Jacalyn LefevreJulie Rogerio Boutelle, MD 09/06/16 [phone]

## 2016-09-08 ENCOUNTER — Other Ambulatory Visit: Payer: Self-pay | Admitting: Cardiovascular Disease

## 2016-09-08 ENCOUNTER — Other Ambulatory Visit: Payer: Self-pay

## 2016-09-08 MED ORDER — ISOSORBIDE DINITRATE 20 MG PO TABS: 20.0000 mg | ORAL_TABLET | Freq: Two times a day (BID) | ORAL | 3 refills | Status: DC

## 2016-09-08 MED FILL — ISOSORBIDE DN 20 MG TABLET: 20 | 90 days supply | Qty: 180 | Fill #0

## 2016-09-09 MED FILL — FUROSEMIDE 40 MG TABLET: 40 | 90 days supply | Qty: 90 | Fill #0

## 2016-09-14 MED FILL — PANTOPRAZOLE SOD DR 40 MG T: 40 | 90 days supply | Qty: 90 | Fill #3

## 2016-09-22 MED FILL — AMITRIPTYLINE HCL 25 MG TAB: 25 | 90 days supply | Qty: 270 | Fill #2

## 2016-10-13 MED FILL — HYDROCODON-APAP 7.5-325: 7.5-325 | 23 days supply | Qty: 90 | Fill #0

## 2016-11-02 DIAGNOSIS — Z800 Family history of malignant neoplasm of digestive organs: Secondary | ICD-10-CM | POA: Diagnosis not present

## 2016-11-02 DIAGNOSIS — Z1211 Encounter for screening for malignant neoplasm of colon: Secondary | ICD-10-CM | POA: Diagnosis not present

## 2016-11-02 DIAGNOSIS — K5730 Diverticulosis of large intestine without perforation or abscess without bleeding: Secondary | ICD-10-CM | POA: Diagnosis not present

## 2016-11-04 ENCOUNTER — Other Ambulatory Visit: Payer: Self-pay | Admitting: Physician Assistant

## 2016-11-04 ENCOUNTER — Other Ambulatory Visit: Payer: Self-pay | Admitting: Cardiovascular Disease

## 2016-11-04 DIAGNOSIS — M25512 Pain in left shoulder: Secondary | ICD-10-CM

## 2016-11-04 MED FILL — LOSARTAN POTASSIUM 50 MG TA: 50 | 90 days supply | Qty: 90 | Fill #0

## 2016-11-04 MED FILL — ATORVASTATIN 10 MG TABLET: 10 | 90 days supply | Qty: 90 | Fill #2

## 2016-11-04 MED FILL — CARVEDILOL 12.5 MG TABLET: 12.5 | 90 days supply | Qty: 360 | Fill #0

## 2016-11-17 ENCOUNTER — Ambulatory Visit (INDEPENDENT_AMBULATORY_CARE_PROVIDER_SITE_OTHER): Payer: 59 | Admitting: *Deleted

## 2016-11-17 DIAGNOSIS — I428 Other cardiomyopathies: Secondary | ICD-10-CM

## 2016-11-17 NOTE — Progress Notes (Signed)
Remote ICD transmission.   

## 2016-11-18 ENCOUNTER — Encounter: Payer: Self-pay | Admitting: Cardiology

## 2016-11-18 LAB — CUP PACEART REMOTE DEVICE CHECK
Battery Remaining Longevity: 74 mo
Battery Remaining Percentage: 73 %
HIGH POWER IMPEDANCE MEASURED VALUE: 79 Ohm
HighPow Impedance: 79 Ohm
Implantable Lead Model: 7122
Implantable Pulse Generator Implant Date: 20141128
Lead Channel Impedance Value: 410 Ohm
Lead Channel Pacing Threshold Pulse Width: 0.5 ms
Lead Channel Setting Pacing Pulse Width: 0.5 ms
MDC IDC LEAD IMPLANT DT: 20141128
MDC IDC LEAD LOCATION: 753860
MDC IDC MSMT BATTERY VOLTAGE: 2.96 V
MDC IDC MSMT LEADCHNL RV PACING THRESHOLD AMPLITUDE: 0.75 V
MDC IDC MSMT LEADCHNL RV SENSING INTR AMPL: 10.6 mV
MDC IDC PG SERIAL: 7136396
MDC IDC SESS DTM: 20180313090021
MDC IDC SET LEADCHNL RV PACING AMPLITUDE: 2.5 V
MDC IDC SET LEADCHNL RV SENSING SENSITIVITY: 0.5 mV
MDC IDC STAT BRADY RV PERCENT PACED: 1 %

## 2016-11-25 MED FILL — KLOR-CON M20 TABLET: 20 | 90 days supply | Qty: 180 | Fill #2

## 2016-12-09 MED FILL — ISOSORBIDE DN 20 MG TABLET: 20 | 90 days supply | Qty: 180 | Fill #1

## 2016-12-09 MED FILL — PANTOPRAZOLE SOD DR 40 MG T: 40 | 90 days supply | Qty: 90 | Fill #0

## 2016-12-09 MED FILL — FUROSEMIDE 40 MG TABLET: 40 | 90 days supply | Qty: 90 | Fill #1

## 2016-12-09 MED FILL — hydrALAZINE HCL 25 MG TABS: 25 | 90 days supply | Qty: 180 | Fill #1

## 2016-12-28 MED FILL — AMITRIPTYLINE HCL 25 MG TAB: 25 | 90 days supply | Qty: 270 | Fill #3

## 2016-12-30 DIAGNOSIS — Z09 Encounter for follow-up examination after completed treatment for conditions other than malignant neoplasm: Secondary | ICD-10-CM | POA: Diagnosis not present

## 2016-12-31 DIAGNOSIS — K219 Gastro-esophageal reflux disease without esophagitis: Secondary | ICD-10-CM | POA: Diagnosis not present

## 2016-12-31 DIAGNOSIS — M79605 Pain in left leg: Secondary | ICD-10-CM | POA: Diagnosis not present

## 2016-12-31 DIAGNOSIS — I10 Essential (primary) hypertension: Secondary | ICD-10-CM | POA: Diagnosis not present

## 2016-12-31 DIAGNOSIS — G4700 Insomnia, unspecified: Secondary | ICD-10-CM | POA: Diagnosis not present

## 2016-12-31 DIAGNOSIS — E7800 Pure hypercholesterolemia, unspecified: Secondary | ICD-10-CM | POA: Diagnosis not present

## 2016-12-31 DIAGNOSIS — M150 Primary generalized (osteo)arthritis: Secondary | ICD-10-CM | POA: Diagnosis not present

## 2016-12-31 DIAGNOSIS — E669 Obesity, unspecified: Secondary | ICD-10-CM | POA: Diagnosis not present

## 2016-12-31 MED FILL — DICLOFENAC SOD 75 MG TAB EC: 75 | 30 days supply | Qty: 60 | Fill #0

## 2016-12-31 MED FILL — HYDROCODON-APAP 7.5-325: 7.5-325 | 22 days supply | Qty: 90 | Fill #0

## 2017-02-05 DIAGNOSIS — E669 Obesity, unspecified: Secondary | ICD-10-CM | POA: Diagnosis not present

## 2017-02-05 DIAGNOSIS — R809 Proteinuria, unspecified: Secondary | ICD-10-CM | POA: Diagnosis not present

## 2017-02-05 DIAGNOSIS — I4901 Ventricular fibrillation: Secondary | ICD-10-CM | POA: Diagnosis not present

## 2017-02-05 DIAGNOSIS — I129 Hypertensive chronic kidney disease with stage 1 through stage 4 chronic kidney disease, or unspecified chronic kidney disease: Secondary | ICD-10-CM | POA: Diagnosis not present

## 2017-02-05 DIAGNOSIS — N184 Chronic kidney disease, stage 4 (severe): Secondary | ICD-10-CM | POA: Diagnosis not present

## 2017-02-10 ENCOUNTER — Other Ambulatory Visit: Payer: Self-pay | Admitting: Nephrology

## 2017-02-10 DIAGNOSIS — N184 Chronic kidney disease, stage 4 (severe): Secondary | ICD-10-CM

## 2017-02-12 MED FILL — LOSARTAN POTASSIUM 50 MG TA: 50 | 90 days supply | Qty: 90 | Fill #1

## 2017-02-15 ENCOUNTER — Ambulatory Visit
Admission: RE | Admit: 2017-02-15 | Discharge: 2017-02-15 | Disposition: A | Discharge status: Home / Self Care | Payer: 59 | Source: Ambulatory Visit | Attending: Nephrology | Admitting: Nephrology

## 2017-02-15 DIAGNOSIS — N184 Chronic kidney disease, stage 4 (severe): Secondary | ICD-10-CM | POA: Diagnosis not present

## 2017-02-16 ENCOUNTER — Ambulatory Visit (INDEPENDENT_AMBULATORY_CARE_PROVIDER_SITE_OTHER): Payer: 59 | Admitting: *Deleted

## 2017-02-16 DIAGNOSIS — I428 Other cardiomyopathies: Secondary | ICD-10-CM

## 2017-02-16 NOTE — Progress Notes (Signed)
Remote ICD transmission.   

## 2017-02-17 ENCOUNTER — Encounter: Payer: Self-pay | Admitting: Cardiology

## 2017-02-17 LAB — CUP PACEART REMOTE DEVICE CHECK
Battery Remaining Longevity: 72 mo
Battery Voltage: 2.96 V
Brady Statistic RV Percent Paced: 1 %
HighPow Impedance: 78 Ohm
HighPow Impedance: 78 Ohm
Implantable Lead Implant Date: 20141128
Implantable Lead Location: 753860
Implantable Lead Model: 7122
Lead Channel Pacing Threshold Amplitude: 0.75 V
Lead Channel Pacing Threshold Pulse Width: 0.5 ms
Lead Channel Sensing Intrinsic Amplitude: 10.3 mV
Lead Channel Setting Sensing Sensitivity: 0.5 mV
MDC IDC MSMT BATTERY REMAINING PERCENTAGE: 71 %
MDC IDC MSMT LEADCHNL RV IMPEDANCE VALUE: 400 Ohm
MDC IDC PG IMPLANT DT: 20141128
MDC IDC PG SERIAL: 7136396
MDC IDC SESS DTM: 20180612080016
MDC IDC SET LEADCHNL RV PACING AMPLITUDE: 2.5 V
MDC IDC SET LEADCHNL RV PACING PULSEWIDTH: 0.5 ms

## 2017-02-23 ENCOUNTER — Other Ambulatory Visit: Payer: Self-pay | Admitting: Cardiovascular Disease

## 2017-02-23 MED FILL — ATORVASTATIN 10 MG TABLET: 10 | 30 days supply | Qty: 30 | Fill #0

## 2017-02-24 ENCOUNTER — Other Ambulatory Visit: Payer: Self-pay | Admitting: *Deleted

## 2017-02-24 MED ORDER — POTASSIUM CHLORIDE CRYS ER 20 MEQ PO TBCR: 40.0000 meq | EXTENDED_RELEASE_TABLET | Freq: Every day | ORAL | 0 refills | Status: DC

## 2017-02-24 MED FILL — hydrALAZINE HCL 25 MG TABS: 25 | 90 days supply | Qty: 180 | Fill #2

## 2017-02-24 MED FILL — POTASSIUM CL ER 20 MEQ TABL: 20 | 30 days supply | Qty: 60 | Fill #0

## 2017-02-24 MED FILL — CARVEDILOL 12.5 MG TABLET: 12.5 | 90 days supply | Qty: 360 | Fill #1

## 2017-02-24 MED FILL — FUROSEMIDE 40 MG TABLET: 40 | 90 days supply | Qty: 90 | Fill #2

## 2017-02-24 MED FILL — ISOSORBIDE DN 20 MG TABLET: 20 | 90 days supply | Qty: 180 | Fill #2

## 2017-03-01 DIAGNOSIS — Z803 Family history of malignant neoplasm of breast: Secondary | ICD-10-CM | POA: Diagnosis not present

## 2017-03-01 DIAGNOSIS — Z1231 Encounter for screening mammogram for malignant neoplasm of breast: Secondary | ICD-10-CM | POA: Diagnosis not present

## 2017-03-31 ENCOUNTER — Other Ambulatory Visit: Payer: Self-pay | Admitting: Cardiovascular Disease

## 2017-03-31 MED FILL — AMITRIPTYLINE HCL 25 MG TAB: 25 | 90 days supply | Qty: 270 | Fill #0

## 2017-03-31 MED FILL — ATORVASTATIN 10 MG TABLET: 10 | 15 days supply | Qty: 15 | Fill #0

## 2017-04-01 MED FILL — HYDROCODON-APAP 7.5-325: 7.5-325 | 23 days supply | Qty: 90 | Fill #0

## 2017-04-13 ENCOUNTER — Other Ambulatory Visit: Payer: Self-pay | Admitting: Cardiovascular Disease

## 2017-04-13 MED FILL — ATORVASTATIN 10 MG TABLET: 10 | 15 days supply | Qty: 15 | Fill #0

## 2017-04-13 MED FILL — POTASSIUM CL ER 20 MEQ TABL: 20 | 15 days supply | Qty: 30 | Fill #0

## 2017-04-28 ENCOUNTER — Other Ambulatory Visit: Payer: Self-pay | Admitting: Cardiovascular Disease

## 2017-04-28 MED FILL — ATORVASTATIN 10 MG TABLET: 10 | 30 days supply | Qty: 30 | Fill #0

## 2017-04-28 MED FILL — POTASSIUM CL ER 20 MEQ TABL: 20 | 15 days supply | Qty: 30 | Fill #0

## 2017-05-04 ENCOUNTER — Ambulatory Visit (INDEPENDENT_AMBULATORY_CARE_PROVIDER_SITE_OTHER): Payer: 59 | Admitting: Physician Assistant

## 2017-05-04 ENCOUNTER — Encounter: Payer: Self-pay | Admitting: Physician Assistant

## 2017-05-04 VITALS — BP 122/79 | HR 81 | Temp 98.4°F | Resp 18 | Ht 63.0 in | Wt 209.2 lb

## 2017-05-04 DIAGNOSIS — R21 Rash and other nonspecific skin eruption: Secondary | ICD-10-CM

## 2017-05-04 MED ORDER — CETIRIZINE HCL 5 MG PO TABS: 5.0000 mg | ORAL_TABLET | Freq: Every day | ORAL | 1 refills | Status: AC

## 2017-05-04 MED ORDER — TRIAMCINOLONE ACETONIDE 0.1 % EX CREA: 1.0000 "application " | TOPICAL_CREAM | Freq: Two times a day (BID) | CUTANEOUS | 0 refills | Status: DC

## 2017-05-04 MED FILL — TRIAMCINOLONE 0.1% CREAM: 0.1 | 20 days supply | Qty: 30 | Fill #0

## 2017-05-04 NOTE — Progress Notes (Signed)
PRIMARY CARE AT [facility] [address] [place] [address] [phone]  Date:  05/04/2017   Name:  Lori Beard   DOB:  Aug 11, 1951   MRN:  [id number]  PCP:  Alroy Dust, L.Marlou Sa, MD    History of Present Illness:  Lori Beard is a 66 y.o. female patient who presents to PCP with  Chief Complaint  Patient presents with  . Insect Bite    on legs and arms x2weeks     Patient complains of rash on her arms and legs that intermittently worsen over the last 2 weeks.  She notes that it is pruritic and if she scratches a lot, it can be painful.  She uses hydrocortisone which helps only temporarily.  She has not been outdoors, but was over a friend's house who complains of the same symptoms.  There were not many pets.  She has washed all her linens.  She has no pets in her home.    Patient Active Problem List   Diagnosis Date Noted  . Chronic systolic CHF (congestive heart failure) ([place])   . S/P implantation of automatic cardioverter/defibrillator (AICD)   . NICM (nonischemic cardiomyopathy) ([place]) 08/03/2013  . Ventricular tachycardia arrest 08/02/2013  . Hypokalemia 08/02/2013  . Elevated troponin 08/02/2013  . Pneumonia 08/01/2013  . HTN (hypertension) 08/01/2013  . Anemia 08/01/2013  . Thrombocytopenia ([place]) 08/01/2013  . Renal failure, acute ([place]) 08/01/2013  . Lapband APS + hiatus hernia repair March 2014 11/23/2012  . Arthritis-bilateral hip replacements 11/10/2012  . Obesity 01/22/2012    Past Medical History:  Diagnosis Date  . Arthritis    ALL OVER  . Cardiac arrest ([place]) 08/01/2013   Polymorphic VT  . Chronic systolic CHF (congestive heart failure) ([place])   . GERD (gastroesophageal reflux disease)   . Hypertension   . Morbid obesity ([place])    a. s/p lapband surgery 11/2012.  [name] Myocardial infarction ([place]) 08/06/13  . NICM (nonischemic cardiomyopathy) ([place])    Echocardiogram (08/01/13): Mild LVH, global HK, EF 40-45%, Gr 1 DD, MAC, mild RVE, mildly reduced RVSF, PASP  31-35.  LHC (08/02/13):  Normal coronary arteries, EF 35%, LVEDP 17.  . S/P implantation of automatic cardioverter/defibrillator (AICD)    07/2013 Lovena Le)  . Sleep apnea 02/15/2012   SLEEP STUDY IN EPIC - MILD OSA-CPAP NOT RECOMMENDED-HOME OXYGEN SUGGESTED BECAUSE OF OXYGEN DESATS ON ROOM AIR.  [name] Ventricular tachycardia (HCC)    a. R on T PVC during admission for pneumonia/NSTEMI => monomorphic VT=>VF=>defib; s/p ICD    Past Surgical History:  Procedure Laterality Date  . ABDOMINAL HYSTERECTOMY    . BREATH TEK H PYLORI  02/15/2012   Procedure: BREATH TEK H PYLORI;  Surgeon: Pedro Earls, MD;  Location: [facility];  Service: General;  Laterality: N/A;  745  . CARDIAC CATHETERIZATION  08/02/2013   Normal coronary arteries, EF 35%   . CHOLECYSTECTOMY    . IMPLANTABLE CARDIOVERTER DEFIBRILLATOR IMPLANT N/A 08/04/2013   Procedure: IMPLANTABLE CARDIOVERTER DEFIBRILLATOR IMPLANT;  Surgeon: Evans Lance, MD;  Location: [facility] CATH LAB;  Service: Cardiovascular;  Laterality: N/A;  . JOINT REPLACEMENT     BILATERAL TOTAL HIP REPLACEMENTS  . KNEE ARTHROSCOPY    . LAPAROSCOPIC GASTRIC BANDING    . LEFT HEART CATHETERIZATION WITH CORONARY ANGIOGRAM N/A 08/02/2013   Procedure: LEFT HEART CATHETERIZATION WITH CORONARY ANGIOGRAM;  Surgeon: Wellington Hampshire, MD;  Location: [facility] CATH LAB;  Service: Cardiovascular;  Laterality: N/A;  . MESH APPLIED TO LAP PORT N/A  11/22/2012   Procedure: MESH APPLIED TO LAP PORT;  Surgeon: Pedro Earls, MD;  Location: [facility] ORS;  Service: General;  Laterality: N/A;  . PACEMAKER INSERTION   08/04/2013   St. [name] single chamber ICD    Social History  Substance Use Topics  . Smoking status: Never Smoker  . Smokeless tobacco: Never Used  . Alcohol use No    Family History  Problem Relation Age of Onset  . Kidney disease Mother   . Heart disease Father   . Hypertension Father   . Colon cancer Father   . Stroke Paternal Grandmother   . Hypertension Paternal  Grandmother   . Breast cancer Sister   . Heart attack Neg Hx     Allergies  Allergen Reactions  . Ace Inhibitors Cough  . Iron Other (See Comments)    Gives patient migraine headaches when supplemented  . Morphine And Related Itching and Rash    Medication list has been reviewed and updated.  Current Outpatient Prescriptions on File Prior to Visit  Medication Sig Dispense Refill  . amitriptyline (ELAVIL) 25 MG tablet Take 75 mg by mouth at bedtime.    [name] aspirin 81 MG tablet Take 1 tablet (81 mg total) by mouth every evening.    [name] atorvastatin (LIPITOR) 10 MG tablet TAKE 1 TABLET BY MOUTH ONCE DAILY AT 6 PM 15 tablet 0  . atorvastatin (LIPITOR) 10 MG tablet TAKE 1 TABLET BY MOUTH DAILY AT 6 PM. PATIENT OVERDUE FOR APPT AND NEEDS TO CALL AND SCHEDULE FOR FURTHER REFILLS 3RD ATTEMPT 30 tablet 0  . azithromycin (ZITHROMAX) 250 MG tablet Take 1 tablet (250 mg total) by mouth daily. Take 1 every day until finished. 4 tablet 0  . carvedilol (COREG) 12.5 MG tablet TAKE 2 TABLETS BY MOUTH TWICE DAILY WITH MEALS 360 tablet 2  . chlorpheniramine-HYDROcodone (TUSSIONEX PENNKINETIC ER) 10-8 MG/5ML SUER Take 5 mLs by mouth at bedtime as needed for cough. 140 mL 0  . furosemide (LASIX) 40 MG tablet TAKE 1 TABLET BY MOUTH EVERY MORNING. 90 tablet 3  . hydrALAZINE (APRESOLINE) 25 MG tablet TAKE 25 MG  BY MOUTH TWICE DAILY 180 tablet 3  . HYDROcodone-acetaminophen (NORCO) 7.5-325 MG tablet Take 1 tablet by mouth every 8 (eight) hours as needed for moderate pain or severe pain.   0  . hydrOXYzine (ATARAX/VISTARIL) 25 MG tablet Take 1 tablet (25 mg total) by mouth every 6 (six) hours. (Patient taking differently: Take 25 mg by mouth every 6 (six) hours as needed for itching. ) 12 tablet 0  . isosorbide dinitrate (ISORDIL) 20 MG tablet Take 1 tablet (20 mg total) by mouth 2 (two) times daily. 180 tablet 3  . losartan (COZAAR) 50 MG tablet TAKE 1 TABLET BY MOUTH DAILY. 90 tablet 2  . Multiple Vitamin  (MULTIVITAMIN) tablet Take 1 tablet by mouth daily.    . pantoprazole (PROTONIX) 40 MG tablet Take 40 mg by mouth every morning.     . potassium chloride SA (K-DUR,KLOR-CON) 20 MEQ tablet TAKE 2 TABLETS BY MOUTH DAILY. PATIENT IS OVERDUE FOR AN APPT AND NEEDS TO CALL AND SCHEDULE FOR FURTHER REFILLS 2ND ATTEMPT 30 tablet 0  . Triamcinolone Acetonide (TRIAMCINOLONE 0.1 % CREAM : EUCERIN) CREA Apply 1 application topically 2 (two) times daily. 1 each 0   No current facility-administered medications on file prior to visit.     ROS ROS otherwise unremarkable unless listed above.  Physical Examination: BP 122/79   Pulse 81  Temp 98.4 F (36.9 C) (Oral)   Resp 18   Ht 5' 3"  (1.6 m)   Wt 209 lb 3.2 oz (94.9 kg)   SpO2 95%   BMI 37.06 kg/m  Ideal Body Weight: Weight in (lb) to have BMI = 25: 140.8  Physical Exam  Constitutional: She is oriented to person, place, and time. She appears well-developed and well-nourished. No distress.  HENT:  Head: Normocephalic and atraumatic.  Right Ear: External ear normal.  Left Ear: External ear normal.  Eyes: Pupils are equal, round, and reactive to light. Conjunctivae and EOM are normal.  Cardiovascular: Normal rate.   Pulmonary/Chest: Effort normal. No respiratory distress.  Neurological: She is alert and oriented to person, place, and time.  Skin: She is not diaphoretic.  Very faint erythematous papules that are non tender along the forearm and one at left side of neck.   Dorsum of foot with erythematous papule. The webbings of her digits and flexors are spared.   Psychiatric: She has a normal mood and affect. Her behavior is normal.     Assessment and Plan: Lori Beard is a 66 y.o. female who is here today for cc of rash Possible bed bugs, fleas, or scabies Given steroid topical, and zyrtec.  Follow up in 1 week if no improvement. Rash and nonspecific skin eruption - Plan: triamcinolone cream (KENALOG) 0.1 %, cetirizine (ZYRTEC) 5 MG  tablet  Ivar Drape, PA-C [facility] 8/28/20187:01 PM

## 2017-05-04 NOTE — Patient Instructions (Addendum)
  Please take medication as prescribed.     IF you received an x-ray today, you will receive an invoice from [facility]. Please contact [facility] Radiology at [phone] with questions or concerns regarding your invoice.   IF you received labwork today, you will receive an invoice from [facility] [place]. Please contact [facility] at [phone] with questions or concerns regarding your invoice.   Our billing staff will not be able to assist you with questions regarding bills from these companies.  You will be contacted with the lab results as soon as they are available. The fastest way to get your results is to activate your My Chart account. Instructions are located on the last page of this paperwork. If you have not heard from [place] regarding the results in 2 weeks, please contact this office.

## 2017-05-11 DIAGNOSIS — I4901 Ventricular fibrillation: Secondary | ICD-10-CM | POA: Diagnosis not present

## 2017-05-11 DIAGNOSIS — I129 Hypertensive chronic kidney disease with stage 1 through stage 4 chronic kidney disease, or unspecified chronic kidney disease: Secondary | ICD-10-CM | POA: Diagnosis not present

## 2017-05-11 DIAGNOSIS — E669 Obesity, unspecified: Secondary | ICD-10-CM | POA: Diagnosis not present

## 2017-05-11 DIAGNOSIS — N184 Chronic kidney disease, stage 4 (severe): Secondary | ICD-10-CM | POA: Diagnosis not present

## 2017-05-13 NOTE — Progress Notes (Signed)
Cardiology Office Note    Date:  05/14/2017   ID:  Veleka, Djordjevic 30-Sep-1950, MRN [id number]  PCP:  Aurea Graff.Marlou Sa, MD  Cardiologist:  Dr. Acie Fredrickson Electrophysiologist: Dr. Lovena Le  Chief Complaint: Yearly follow up for CHF  History of Present Illness:   Lori Beard is a 65 y.o. female chronic systolic CHF, NICM, VF arrest s/p St. [name] ICD, HTN, obesity s/p LAP-BAND surgery, OSA and GERD presents for follow up.   She was admitted 07/2013 with pneumonia. She ruled in for a NSTEMI. Her hospitalization was complicated by monomorphic VTach that progressed to VFib due to R on T PVC in the setting of hypokalemia with a K of 2.9. Echocardiogram (08/01/13): Mild LVH, global HK, EF 40-45%, Gr 1 DD, MAC, mild RVE, mildly reduced RVSF, PASP 31-35. LHC (08/02/13): Normal coronary arteries, EF 35%, LVEDP 17. She was seen by EP and underwent ICD implantation for secondary prevention of malignant ventricular arrhythmias in the setting of nonischemic cardiomyopathy.  He was doing well on cardiac stand point when last seen by Dr. Lovena Le 08/2016.  Patient is here for follow-up. Complains of fatigue and tiredness however not active. She denies any regular exercise of walking regimen. She denies any exertional chest pain or shortness of breath with ambulation. She denies orthopnea, PND, syncope, dizziness, chest pain, shortness of breath, melena or blood in her stool or urine.  Past Medical History:  Diagnosis Date  . Arthritis    ALL OVER  . Cardiac arrest ([place]) 08/01/2013   Polymorphic VT  . Chronic systolic CHF (congestive heart failure) ([place])   . GERD (gastroesophageal reflux disease)   . Hypertension   . Morbid obesity ([place])    a. s/p lapband surgery 11/2012.  [name] Myocardial infarction ([place]) 08/06/13  . NICM (nonischemic cardiomyopathy) ([place])    Echocardiogram (08/01/13): Mild LVH, global HK, EF 40-45%, Gr 1 DD, MAC, mild RVE, mildly reduced RVSF, PASP 31-35.  LHC (08/02/13):  Normal  coronary arteries, EF 35%, LVEDP 17.  . S/P implantation of automatic cardioverter/defibrillator (AICD)    07/2013 Lovena Le)  . Sleep apnea 02/15/2012   SLEEP STUDY IN EPIC - MILD OSA-CPAP NOT RECOMMENDED-HOME OXYGEN SUGGESTED BECAUSE OF OXYGEN DESATS ON ROOM AIR.  [name] Ventricular tachycardia (HCC)    a. R on T PVC during admission for pneumonia/NSTEMI => monomorphic VT=>VF=>defib; s/p ICD    Past Surgical History:  Procedure Laterality Date  . ABDOMINAL HYSTERECTOMY    . BREATH TEK H PYLORI  02/15/2012   Procedure: BREATH TEK H PYLORI;  Surgeon: Pedro Earls, MD;  Location: [facility];  Service: General;  Laterality: N/A;  745  . CARDIAC CATHETERIZATION  08/02/2013   Normal coronary arteries, EF 35%   . CHOLECYSTECTOMY    . IMPLANTABLE CARDIOVERTER DEFIBRILLATOR IMPLANT N/A 08/04/2013   Procedure: IMPLANTABLE CARDIOVERTER DEFIBRILLATOR IMPLANT;  Surgeon: Evans Lance, MD;  Location: [facility] CATH LAB;  Service: Cardiovascular;  Laterality: N/A;  . JOINT REPLACEMENT     BILATERAL TOTAL HIP REPLACEMENTS  . KNEE ARTHROSCOPY    . LAPAROSCOPIC GASTRIC BANDING    . LEFT HEART CATHETERIZATION WITH CORONARY ANGIOGRAM N/A 08/02/2013   Procedure: LEFT HEART CATHETERIZATION WITH CORONARY ANGIOGRAM;  Surgeon: Wellington Hampshire, MD;  Location: [facility] CATH LAB;  Service: Cardiovascular;  Laterality: N/A;  . MESH APPLIED TO LAP PORT N/A 11/22/2012   Procedure: MESH APPLIED TO LAP PORT;  Surgeon: Pedro Earls, MD;  Location: [facility] ORS;  Service: General;  Laterality: N/A;  .  PACEMAKER INSERTION   08/04/2013   St. [name] single chamber ICD    Current Medications:  Prior to Admission medications   Medication Sig Start Date End Date Taking? Authorizing Provider  amitriptyline (ELAVIL) 25 MG tablet Take 75 mg by mouth at bedtime. 04/12/12  Yes Dunn, Areta Haber, PA-C  aspirin 81 MG tablet Take 1 tablet (81 mg total) by mouth every evening. 10/06/13  Yes Nahser, Wonda Cheng, MD  atorvastatin (LIPITOR) 10 MG tablet TAKE  1 TABLET BY MOUTH ONCE DAILY AT 6 PM 03/31/17  Yes Nahser, Wonda Cheng, MD  atorvastatin (LIPITOR) 10 MG tablet TAKE 1 TABLET BY MOUTH DAILY AT 6 PM. PATIENT OVERDUE FOR APPT AND NEEDS TO CALL AND SCHEDULE FOR FURTHER REFILLS 3RD ATTEMPT 04/28/17  Yes Nahser, Wonda Cheng, MD  azithromycin (ZITHROMAX) 250 MG tablet Take 1 tablet (250 mg total) by mouth daily. Take 1 every day until finished. 09/06/16  Yes Isla Pence, MD  carvedilol (COREG) 12.5 MG tablet TAKE 2 TABLETS BY MOUTH TWICE DAILY WITH MEALS 11/04/16  Yes Nahser, Wonda Cheng, MD  cetirizine (ZYRTEC) 5 MG tablet Take 1 tablet (5 mg total) by mouth daily. 05/04/17  Yes English, Colletta Maryland D, PA  chlorpheniramine-HYDROcodone (TUSSIONEX PENNKINETIC ER) 10-8 MG/5ML SUER Take 5 mLs by mouth at bedtime as needed for cough. 09/06/16  Yes Isla Pence, MD  furosemide (LASIX) 40 MG tablet TAKE 1 TABLET BY MOUTH EVERY MORNING. 09/09/16  Yes Nahser, Wonda Cheng, MD  hydrALAZINE (APRESOLINE) 25 MG tablet TAKE 25 MG  BY MOUTH TWICE DAILY 09/01/16  Yes Nahser, Wonda Cheng, MD  HYDROcodone-acetaminophen (NORCO) 7.5-325 MG tablet Take 1 tablet by mouth every 8 (eight) hours as needed for moderate pain or severe pain.  01/07/16  Yes [provider]  hydrOXYzine (ATARAX/VISTARIL) 25 MG tablet Take 1 tablet (25 mg total) by mouth every 6 (six) hours. Patient taking differently: Take 25 mg by mouth every 6 (six) hours as needed for itching.  10/22/15  Yes Sam, Serena Y, PA-C  isosorbide dinitrate (ISORDIL) 20 MG tablet Take 1 tablet (20 mg total) by mouth 2 (two) times daily. 09/08/16  Yes Evans Lance, MD  losartan (COZAAR) 50 MG tablet TAKE 1 TABLET BY MOUTH DAILY. 11/04/16  Yes Weaver, Scott T, PA-C  Multiple Vitamin (MULTIVITAMIN) tablet Take 1 tablet by mouth daily.   Yes [provider]  potassium chloride SA (K-DUR,KLOR-CON) 20 MEQ tablet TAKE 2 TABLETS BY MOUTH DAILY. PATIENT IS OVERDUE FOR AN APPT AND NEEDS TO CALL AND SCHEDULE FOR FURTHER REFILLS 2ND  ATTEMPT 04/28/17  Yes Nahser, Wonda Cheng, MD  Triamcinolone Acetonide (TRIAMCINOLONE 0.1 % CREAM : EUCERIN) CREA Apply 1 application topically 2 (two) times daily. 10/22/15  Yes Sam, Serena Y, PA-C  triamcinolone cream (KENALOG) 0.1 % Apply 1 application topically 2 (two) times daily. 05/04/17  Yes Ivar Drape D, PA    Allergies:   Ace inhibitors; Iron; and Morphine and related   Social History   Social History  . Marital status: Married    Spouse name: N/A  . Number of children: N/A  . Years of education: N/A   Social History Main Topics  . Smoking status: Never Smoker  . Smokeless tobacco: Never Used  . Alcohol use No  . Drug use: No  . Sexual activity: Not Currently   Other Topics Concern  . None   Social History Narrative  . None     Family History:  The patient's family history includes Breast cancer  in her sister; Colon cancer in her father; Heart disease in her father; Hypertension in her father and paternal grandmother; Kidney disease in her mother; Stroke in her paternal grandmother.   ROS:   Please see the history of present illness.    ROS All other systems reviewed and are negative.   PHYSICAL EXAM:   VS:  BP 115/78   Pulse 66   Ht _0  (1.6 m)   Wt 210 lb 12.8 oz (95.6 kg)   SpO2 96%   BMI 37.34 kg/m    GEN: Well nourished, well developed, in no acute distress  HEENT: normal  Neck: no JVD, carotid bruits, or masses Cardiac: RRR; no murmurs, rubs, or gallops,no edema  Respiratory:  clear to auscultation bilaterally, normal work of breathing GI: soft, nontender, nondistended, + BS MS: no deformity or atrophy  Skin: warm and dry, no rash Neuro:  Alert and Oriented x 3, Strength and sensation are intact Psych: euthymic mood, full affect  Wt Readings from Last 3 Encounters:  05/14/17 210 lb 12.8 oz (95.6 kg)  05/04/17 209 lb 3.2 oz (94.9 kg)  09/06/16 209 lb (94.8 kg)      Studies/Labs Reviewed:   EKG:  EKG is not ordered today.    Recent  Labs: No results found for requested labs within last 8760 hours.   Lipid Panel    Component Value Date/Time   CHOL 118 (L) 09/26/2015 1558   TRIG 85 09/26/2015 1558   HDL 40 (L) 09/26/2015 1558   CHOLHDL 3.0 09/26/2015 1558   VLDL 17 09/26/2015 1558   LDLCALC 61 09/26/2015 1558    Additional studies/ records that were reviewed today include:  As above  Cath 07/2013 Hemodynamics: AO:   113/74   mmHg LV:   110/13    mmHg LVEDP:  17  mmHg  Coronary angiography: Coronary dominance:  right   Left Main:   normal  Left Anterior Descending (LAD):   normal in size with no significant disease.  1st diagonal (D1):   large in size with no significant disease.  2nd diagonal (D2):   normal in size with no significant disease.  3rd diagonal (D3):   very small in size.  Circumflex (LCx):   normal in size and nondominant. The vessel has no significant disease.  1st obtuse marginal:    small in size with no significant disease.  2nd obtuse marginal:   medium in size with no significant disease.  3rd obtuse marginal:   normal in size with no significant disease.       Right Coronary Artery:  large in size and dominant. The vessel has no significant disease.  Posterior descending artery:  large in size and normal.  Posterior AV segment:  large in size and normal.  Posterolateral branchs:   no significant disease.  Left ventriculography: the left ventricle appears to be mildly dilated.  Left ventricular systolic function is moderately reduced , LVEF is estimated at 35 %, there is mild mitral regurgitation .   Final Conclusions:    1. Normal coronary arteries. 2. Moderately reduced LV systolic function with an estimated ejection fraction of 35%. Mildly elevated left ventricular end-diastolic pressure.    ASSESSMENT & PLAN:    1. Chronic systolic CHF/ NICM - EF of 40-45% by echo in 2014. No evidence of CHF. Continue current regimen. Discuss heart failure symptoms in  detail.  2. VF s/p St. [name] ICD - Followed by Dr. Lovena Le  3. HTN - Stable  and well controlled on current regimen.  4. HLD - managed by PCP. Continue statin.   5. Fatigue -Likely due to deconditioning. Advised weight loss and regular exercise.                     Medication Adjustments/Labs and Tests Ordered: Current medicines are reviewed at length with the patient today.  Concerns regarding medicines are outlined above.  Medication changes, Labs and Tests ordered today are listed in the Patient Instructions below. Patient Instructions  Your physician recommends that you continue on your current medications as directed. Please refer to the Current Medication list given to you today.  Your physician wants you to follow-up in: [place] will receive a reminder letter in the mail two months in advance. If you don't receive a letter, please call our office to schedule the follow-up appointment.     Lori Beard, [place]  05/14/2017 2:32 PM    [facility] [address], [place], [place]  [address] Phone: [phone]; Fax: [phone]

## 2017-05-14 ENCOUNTER — Ambulatory Visit (INDEPENDENT_AMBULATORY_CARE_PROVIDER_SITE_OTHER): Payer: 59 | Admitting: Physician Assistant

## 2017-05-14 ENCOUNTER — Encounter: Payer: Self-pay | Admitting: Physician Assistant

## 2017-05-14 VITALS — BP 115/78 | HR 66 | Ht 63.0 in | Wt 210.8 lb

## 2017-05-14 DIAGNOSIS — Z95810 Presence of automatic (implantable) cardiac defibrillator: Secondary | ICD-10-CM | POA: Diagnosis not present

## 2017-05-14 DIAGNOSIS — I10 Essential (primary) hypertension: Secondary | ICD-10-CM | POA: Diagnosis not present

## 2017-05-14 DIAGNOSIS — I5022 Chronic systolic (congestive) heart failure: Secondary | ICD-10-CM

## 2017-05-14 DIAGNOSIS — E7849 Other hyperlipidemia: Secondary | ICD-10-CM

## 2017-05-14 DIAGNOSIS — I428 Other cardiomyopathies: Secondary | ICD-10-CM

## 2017-05-14 DIAGNOSIS — E784 Other hyperlipidemia: Secondary | ICD-10-CM | POA: Diagnosis not present

## 2017-05-14 MED ORDER — FUROSEMIDE 40 MG PO TABS: 40.0000 mg | ORAL_TABLET | Freq: Every morning | ORAL | 3 refills | Status: DC

## 2017-05-14 MED ORDER — ATORVASTATIN CALCIUM 10 MG PO TABS: ORAL_TABLET | ORAL | 3 refills | Status: DC

## 2017-05-14 MED ORDER — LOSARTAN POTASSIUM 50 MG PO TABS: 50.0000 mg | ORAL_TABLET | Freq: Every day | ORAL | 2 refills | Status: DC

## 2017-05-14 MED ORDER — POTASSIUM CHLORIDE CRYS ER 20 MEQ PO TBCR: EXTENDED_RELEASE_TABLET | ORAL | 3 refills | Status: DC

## 2017-05-14 MED FILL — LOSARTAN POTASSIUM 50 MG TA: 50 | 90 days supply | Qty: 90 | Fill #0

## 2017-05-14 MED FILL — POTASSIUM CL ER 20 MEQ TABL: 20 | 90 days supply | Qty: 180 | Fill #0

## 2017-05-14 MED FILL — FUROSEMIDE 40 MG TABLET: 40 | 90 days supply | Qty: 90 | Fill #0

## 2017-05-14 NOTE — Patient Instructions (Signed)
Your physician recommends that you continue on your current medications as directed. Please refer to the Current Medication list given to you today.  Your physician wants you to follow-up in: 6 MONTHS WITH DR NAHSER  You will receive a reminder letter in the mail two months in advance. If you don't receive a letter, please call our office to schedule the follow-up appointment.  

## 2017-05-18 ENCOUNTER — Ambulatory Visit (INDEPENDENT_AMBULATORY_CARE_PROVIDER_SITE_OTHER): Payer: 59 | Admitting: *Deleted

## 2017-05-18 DIAGNOSIS — I428 Other cardiomyopathies: Secondary | ICD-10-CM

## 2017-05-18 NOTE — Progress Notes (Signed)
Remote ICD transmission.   

## 2017-05-19 ENCOUNTER — Encounter: Payer: Self-pay | Admitting: Cardiology

## 2017-05-20 LAB — CUP PACEART REMOTE DEVICE CHECK
Date Time Interrogation Session: 20180913135251
Implantable Lead Location: 753860
Implantable Lead Model: 7122
Implantable Pulse Generator Implant Date: 20141128
Lead Channel Setting Pacing Amplitude: 2.5 V
Lead Channel Setting Pacing Pulse Width: 0.5 ms
MDC IDC LEAD IMPLANT DT: 20141128
MDC IDC SET LEADCHNL RV SENSING SENSITIVITY: 0.5 mV
Pulse Gen Serial Number: 7136396

## 2017-06-01 MED FILL — CARVEDILOL 12.5 MG TABLET: 12.5 | 90 days supply | Qty: 360 | Fill #2

## 2017-06-01 MED FILL — ATORVASTATIN 10 MG TABLET: 10 | 90 days supply | Qty: 90 | Fill #0

## 2017-06-15 DIAGNOSIS — Z23 Encounter for immunization: Secondary | ICD-10-CM | POA: Diagnosis not present

## 2017-06-24 MED FILL — AMITRIPTYLINE HCL 25 MG TAB: 25 | 90 days supply | Qty: 270 | Fill #0

## 2017-06-24 MED FILL — hydrALAZINE HCL 25 MG TABS: 25 | 90 days supply | Qty: 180 | Fill #3

## 2017-06-24 MED FILL — ISOSORBIDE DN 20 MG TABLET: 20 | 90 days supply | Qty: 180 | Fill #3

## 2017-07-02 DIAGNOSIS — K219 Gastro-esophageal reflux disease without esophagitis: Secondary | ICD-10-CM | POA: Diagnosis not present

## 2017-07-02 DIAGNOSIS — I10 Essential (primary) hypertension: Secondary | ICD-10-CM | POA: Diagnosis not present

## 2017-07-02 DIAGNOSIS — E669 Obesity, unspecified: Secondary | ICD-10-CM | POA: Diagnosis not present

## 2017-07-02 DIAGNOSIS — M150 Primary generalized (osteo)arthritis: Secondary | ICD-10-CM | POA: Diagnosis not present

## 2017-07-02 DIAGNOSIS — E7800 Pure hypercholesterolemia, unspecified: Secondary | ICD-10-CM | POA: Diagnosis not present

## 2017-07-02 DIAGNOSIS — G4700 Insomnia, unspecified: Secondary | ICD-10-CM | POA: Diagnosis not present

## 2017-07-02 DIAGNOSIS — N183 Chronic kidney disease, stage 3 (moderate): Secondary | ICD-10-CM | POA: Diagnosis not present

## 2017-07-02 MED FILL — HYDROCODON-APAP 7.5-325: 7.5-325 | 22 days supply | Qty: 90 | Fill #0

## 2017-07-23 DIAGNOSIS — Z9884 Bariatric surgery status: Secondary | ICD-10-CM | POA: Diagnosis not present

## 2017-07-27 DIAGNOSIS — J069 Acute upper respiratory infection, unspecified: Secondary | ICD-10-CM | POA: Diagnosis not present

## 2017-07-27 MED FILL — PROMETHAZINE W/COD SYRUP: 6.25-10 | 4 days supply | Qty: 120 | Fill #0

## 2017-07-27 MED FILL — AZELASTINE HCL 137 MCG SPRY: 0.1 | 30 days supply | Qty: 30 | Fill #0

## 2017-08-02 MED FILL — HYDROCODONE-HOMATROPINE SYR: 5-1.5 | 4 days supply | Qty: 120 | Fill #0

## 2017-08-09 MED FILL — LOSARTAN POTASSIUM 50 MG TA: 50 | 90 days supply | Qty: 90 | Fill #1

## 2017-08-09 MED FILL — FUROSEMIDE 40 MG TAB: 40 | 90 days supply | Qty: 90 | Fill #1

## 2017-08-17 ENCOUNTER — Ambulatory Visit (INDEPENDENT_AMBULATORY_CARE_PROVIDER_SITE_OTHER): Payer: 59 | Admitting: *Deleted

## 2017-08-17 DIAGNOSIS — I428 Other cardiomyopathies: Secondary | ICD-10-CM

## 2017-08-17 NOTE — Progress Notes (Signed)
Remote ICD transmission.   

## 2017-08-20 ENCOUNTER — Encounter: Payer: Self-pay | Admitting: Cardiology

## 2017-08-24 DIAGNOSIS — R300 Dysuria: Secondary | ICD-10-CM | POA: Diagnosis not present

## 2017-08-24 MED FILL — SULFAMETHOXAZOLE/TMP DS TAB: 800-160 | 3 days supply | Qty: 6 | Fill #0

## 2017-08-30 MED FILL — POTASSIUM CL ER 20 MEQ TABL: 20 | 90 days supply | Qty: 180 | Fill #1

## 2017-09-01 DIAGNOSIS — K219 Gastro-esophageal reflux disease without esophagitis: Secondary | ICD-10-CM | POA: Insufficient documentation

## 2017-09-01 DIAGNOSIS — I10 Essential (primary) hypertension: Secondary | ICD-10-CM | POA: Insufficient documentation

## 2017-09-09 LAB — CUP PACEART REMOTE DEVICE CHECK
Battery Remaining Longevity: 68 mo
Battery Remaining Percentage: 67 %
Brady Statistic RV Percent Paced: 1 %
HIGH POWER IMPEDANCE MEASURED VALUE: 73 Ohm
HighPow Impedance: 73 Ohm
Implantable Lead Implant Date: 20141128
Implantable Lead Model: 7122
Lead Channel Impedance Value: 430 Ohm
Lead Channel Pacing Threshold Amplitude: 0.75 V
Lead Channel Sensing Intrinsic Amplitude: 11.4 mV
Lead Channel Setting Pacing Amplitude: 2.5 V
MDC IDC LEAD LOCATION: 753860
MDC IDC MSMT BATTERY VOLTAGE: 2.96 V
MDC IDC MSMT LEADCHNL RV PACING THRESHOLD PULSEWIDTH: 0.5 ms
MDC IDC PG IMPLANT DT: 20141128
MDC IDC PG SERIAL: 7136396
MDC IDC SESS DTM: 20181211090016
MDC IDC SET LEADCHNL RV PACING PULSEWIDTH: 0.5 ms
MDC IDC SET LEADCHNL RV SENSING SENSITIVITY: 0.5 mV

## 2017-09-13 ENCOUNTER — Other Ambulatory Visit: Payer: Self-pay | Admitting: Cardiovascular Disease

## 2017-09-13 MED FILL — ATORVASTATIN 10 MG TABLET: 10 | 90 days supply | Qty: 90 | Fill #1

## 2017-09-13 MED FILL — AMITRIPTYLINE HCL 25 MG TAB: 25 | 90 days supply | Qty: 270 | Fill #1

## 2017-09-13 MED FILL — CARVEDILOL 12.5 MG TABLET: 12.5 | 90 days supply | Qty: 360 | Fill #0

## 2017-09-21 ENCOUNTER — Ambulatory Visit (INDEPENDENT_AMBULATORY_CARE_PROVIDER_SITE_OTHER): Payer: 59 | Admitting: Internal Medicine

## 2017-09-21 ENCOUNTER — Encounter: Payer: Self-pay | Admitting: Internal Medicine

## 2017-09-21 ENCOUNTER — Encounter (INDEPENDENT_AMBULATORY_CARE_PROVIDER_SITE_OTHER): Payer: Self-pay

## 2017-09-21 VITALS — BP 110/86 | HR 64 | Ht 63.0 in | Wt 206.0 lb

## 2017-09-21 DIAGNOSIS — Z95810 Presence of automatic (implantable) cardiac defibrillator: Secondary | ICD-10-CM | POA: Diagnosis not present

## 2017-09-21 DIAGNOSIS — I10 Essential (primary) hypertension: Secondary | ICD-10-CM | POA: Diagnosis not present

## 2017-09-21 DIAGNOSIS — I472 Ventricular tachycardia, unspecified: Secondary | ICD-10-CM

## 2017-09-21 DIAGNOSIS — K219 Gastro-esophageal reflux disease without esophagitis: Secondary | ICD-10-CM

## 2017-09-21 DIAGNOSIS — I428 Other cardiomyopathies: Secondary | ICD-10-CM | POA: Diagnosis not present

## 2017-09-21 LAB — CUP PACEART INCLINIC DEVICE CHECK
Battery Remaining Longevity: 69 mo
Brady Statistic RV Percent Paced: 0.01 %
HIGH POWER IMPEDANCE MEASURED VALUE: 69.75 Ohm
Implantable Lead Implant Date: 20141128
Lead Channel Impedance Value: 425 Ohm
Lead Channel Pacing Threshold Pulse Width: 0.5 ms
Lead Channel Sensing Intrinsic Amplitude: 11.9 mV
Lead Channel Setting Pacing Amplitude: 2.5 V
Lead Channel Setting Pacing Pulse Width: 0.5 ms
Lead Channel Setting Sensing Sensitivity: 0.5 mV
MDC IDC LEAD LOCATION: 753860
MDC IDC MSMT LEADCHNL RV PACING THRESHOLD AMPLITUDE: 0.75 V
MDC IDC MSMT LEADCHNL RV PACING THRESHOLD AMPLITUDE: 0.75 V
MDC IDC MSMT LEADCHNL RV PACING THRESHOLD PULSEWIDTH: 0.5 ms
MDC IDC PG IMPLANT DT: 20141128
MDC IDC SESS DTM: 20190115130229
Pulse Gen Serial Number: 7136396

## 2017-09-21 MED ORDER — OMEPRAZOLE 20 MG PO CPDR: 20.0000 mg | DELAYED_RELEASE_CAPSULE | Freq: Every day | ORAL | 3 refills | Status: DC

## 2017-09-21 MED FILL — OMEPRAZOLE 20 MG CAP: 20 | 90 days supply | Qty: 90 | Fill #0

## 2017-09-21 NOTE — Progress Notes (Signed)
HPI Lori Beard returns today for followup. She is a pleasant 67 yo woman with non-ischemic CM, chronic systolic heart failure, VF arrest and HTN. She has done well in the interim. No chest pain or sob. No syncope or ICD shock. She notes some anxiety when she eats. She will feel occaissional palpitations. Her only complaint is acid reflux. She is not taking any meds for this. Allergies  Allergen Reactions  . Ace Inhibitors Cough  . Iron Other (See Comments)    Gives patient migraine headaches when supplemented  . Morphine And Related Itching and Rash     Current Outpatient Medications  Medication Sig Dispense Refill  . amitriptyline (ELAVIL) 25 MG tablet Take 75 mg by mouth at bedtime.    [name] aspirin 81 MG tablet Take 1 tablet (81 mg total) by mouth every evening.    [name] atorvastatin (LIPITOR) 10 MG tablet TAKE 1 TABLET BY MOUTH ONCE DAILY AT 6 PM 15 tablet 0  . carvedilol (COREG) 12.5 MG tablet TAKE 2 TABLETS BY MOUTH TWICE DAILY WITH MEALS 360 tablet 2  . cetirizine (ZYRTEC) 5 MG tablet Take 1 tablet (5 mg total) by mouth daily. 14 tablet 1  . furosemide (LASIX) 40 MG tablet Take 1 tablet (40 mg total) by mouth every morning. 90 tablet 3  . hydrALAZINE (APRESOLINE) 25 MG tablet TAKE 25 MG  BY MOUTH TWICE DAILY 180 tablet 3  . HYDROcodone-acetaminophen (NORCO) 7.5-325 MG tablet Take 1 tablet by mouth every 8 (eight) hours as needed for moderate pain or severe pain.   0  . hydrOXYzine (ATARAX/VISTARIL) 25 MG tablet Take 25 mg by mouth every 6 (six) hours as needed.    . isosorbide dinitrate (ISORDIL) 20 MG tablet Take 1 tablet (20 mg total) by mouth 2 (two) times daily. 180 tablet 3  . losartan (COZAAR) 50 MG tablet Take 1 tablet (50 mg total) by mouth daily. 90 tablet 2  . Multiple Vitamin (MULTIVITAMIN) tablet Take 1 tablet by mouth daily.    . potassium chloride SA (K-DUR,KLOR-CON) 20 MEQ tablet 2 TABS DAILY 180 tablet 3  . Triamcinolone Acetonide (TRIAMCINOLONE 0.1 % CREAM :  EUCERIN) CREA Apply 1 application topically 2 (two) times daily. 1 each 0  . triamcinolone cream (KENALOG) 0.1 % Apply 1 application topically 2 (two) times daily. 30 g 0   No current facility-administered medications for this visit.      Past Medical History:  Diagnosis Date  . Arthritis    ALL OVER  . Cardiac arrest ([place]) 08/01/2013   Polymorphic VT  . Chronic systolic CHF (congestive heart failure) ([place])   . GERD (gastroesophageal reflux disease)   . Hypertension   . Morbid obesity ([place])    a. s/p lapband surgery 11/2012.  [name] Myocardial infarction ([place]) 08/06/13  . NICM (nonischemic cardiomyopathy) ([place])    Echocardiogram (08/01/13): Mild LVH, global HK, EF 40-45%, Gr 1 DD, MAC, mild RVE, mildly reduced RVSF, PASP 31-35.  LHC (08/02/13):  Normal coronary arteries, EF 35%, LVEDP 17.  . S/P implantation of automatic cardioverter/defibrillator (AICD)    07/2013 Lovena Le)  . Sleep apnea 02/15/2012   SLEEP STUDY IN EPIC - MILD OSA-CPAP NOT RECOMMENDED-HOME OXYGEN SUGGESTED BECAUSE OF OXYGEN DESATS ON ROOM AIR.  [name] Ventricular tachycardia ([place])    a. R on T PVC during admission for pneumonia/NSTEMI => monomorphic VT=>VF=>defib; s/p ICD    ROS:   All systems reviewed and negative except as noted in the HPI.  Past Surgical History:  Procedure Laterality Date  . ABDOMINAL HYSTERECTOMY    . BREATH TEK H PYLORI  02/15/2012   Procedure: BREATH TEK H PYLORI;  Surgeon: Pedro Earls, MD;  Location: [facility];  Service: General;  Laterality: N/A;  745  . CARDIAC CATHETERIZATION  08/02/2013   Normal coronary arteries, EF 35%   . CHOLECYSTECTOMY    . IMPLANTABLE CARDIOVERTER DEFIBRILLATOR IMPLANT N/A 08/04/2013   Procedure: IMPLANTABLE CARDIOVERTER DEFIBRILLATOR IMPLANT;  Surgeon: Evans Lance, MD;  Location: [facility] CATH LAB;  Service: Cardiovascular;  Laterality: N/A;  . JOINT REPLACEMENT     BILATERAL TOTAL HIP REPLACEMENTS  . KNEE ARTHROSCOPY    . LAPAROSCOPIC GASTRIC BANDING    .  LEFT HEART CATHETERIZATION WITH CORONARY ANGIOGRAM N/A 08/02/2013   Procedure: LEFT HEART CATHETERIZATION WITH CORONARY ANGIOGRAM;  Surgeon: Wellington Hampshire, MD;  Location: [facility] CATH LAB;  Service: Cardiovascular;  Laterality: N/A;  . MESH APPLIED TO LAP PORT N/A 11/22/2012   Procedure: MESH APPLIED TO LAP PORT;  Surgeon: Pedro Earls, MD;  Location: [facility] ORS;  Service: General;  Laterality: N/A;  . PACEMAKER INSERTION   08/04/2013   St. [name] single chamber ICD     Family History  Problem Relation Age of Onset  . Kidney disease Mother   . Heart disease Father   . Hypertension Father   . Colon cancer Father   . Stroke Paternal Grandmother   . Hypertension Paternal Grandmother   . Breast cancer Sister   . Heart attack Neg Hx      Social History   Socioeconomic History  . Marital status: Married    Spouse name: Not on file  . Number of children: Not on file  . Years of education: Not on file  . Highest education level: Not on file  Social Needs  . Financial resource strain: Not on file  . Food insecurity - worry: Not on file  . Food insecurity - inability: Not on file  . Transportation needs - medical: Not on file  . Transportation needs - non-medical: Not on file  Occupational History  . Not on file  Tobacco Use  . Smoking status: Never Smoker  . Smokeless tobacco: Never Used  Substance and Sexual Activity  . Alcohol use: No  . Drug use: No  . Sexual activity: Not Currently  Other Topics Concern  . Not on file  Social History Narrative  . Not on file     BP 110/86   Pulse 64   Ht _0  (1.6 m)   Wt 206 lb (93.4 kg)   BMI 36.49 kg/m   Physical Exam:  Well appearing 67 yo woman, NAD HEENT: Unremarkable Neck:  6 cm JVD, no thyromegally Lymphatics:  No adenopathy Back:  No CVA tenderness Lungs:  Clear with no wheezes HEART:  Regular rate rhythm, no murmurs, no rubs, no clicks Abd:  soft, positive bowel sounds, no organomegally, no rebound, no  guarding Ext:  2 plus pulses, no edema, no cyanosis, no clubbing Skin:  No rashes no nodules Neuro:  CN II through XII intact, motor grossly intact  EKG - NSR cannot rule out inferior MI  DEVICE  Normal device function.  See PaceArt for details.   Assess/Plan: 1. VF arrest - she is s/p ICD and has not had any additional ventricular arrhythmias. 2. Chronic systolic heart failure - her symptoms are class 2. Will follow. 3. Acid reflux - I have recommended she try omeprazole 4.  ICD - her St. [name] device is working normally. Will recheck in several months.  Mikle Bosworth.D.

## 2017-09-21 NOTE — Patient Instructions (Addendum)
Medication Instructions:  Your physician has recommended you make the following change in your medication:  1. Start omeprazole 20mg  everyday by mouth for acid reflux.   Labwork: None ordered.  Testing/Procedures: None ordered.  Follow-Up: Your physician wants you to follow-up in: One Year with Dr Stevan Born will receive a reminder letter in the mail two months in advance. If you don't receive a letter, please call our office to schedule the follow-up appointment.  Remote monitoring is used to monitor your ICD from home. This monitoring reduces the number of office visits required to check your device to one time per year. It allows [place] to keep an eye on the functioning of your device to ensure it is working properly. You are scheduled for a device check from home on 11/16/2017. You may send your transmission at any time that day. If you have a wireless device, the transmission will be sent automatically. After your physician reviews your transmission, you will receive a postcard with your next transmission date.    Any Other Special Instructions Will Be Listed Below (If Applicable).     If you need a refill on your cardiac medications before your next appointment, please call your pharmacy.

## 2017-10-01 ENCOUNTER — Other Ambulatory Visit: Payer: Self-pay | Admitting: Cardiovascular Disease

## 2017-10-01 ENCOUNTER — Other Ambulatory Visit: Payer: Self-pay | Admitting: Internal Medicine

## 2017-10-01 MED FILL — hydrALAZINE HCL 25 MG TABS: 25 | 90 days supply | Qty: 180 | Fill #0

## 2017-10-01 MED FILL — ISOSORBIDE DN 20 MG TABLET: 20 | 90 days supply | Qty: 180 | Fill #0

## 2017-10-07 MED FILL — HYDROCODON-APAP 7.5-325: 7.5-325 | 23 days supply | Qty: 90 | Fill #0

## 2017-11-05 MED FILL — FUROSEMIDE 40 MG TAB: 40 | 90 days supply | Qty: 90 | Fill #2

## 2017-11-05 MED FILL — LOSARTAN POTASSIUM 50 MG TA: 50 | 90 days supply | Qty: 90 | Fill #2

## 2017-11-16 ENCOUNTER — Ambulatory Visit (INDEPENDENT_AMBULATORY_CARE_PROVIDER_SITE_OTHER): Payer: 59 | Admitting: *Deleted

## 2017-11-16 DIAGNOSIS — I428 Other cardiomyopathies: Secondary | ICD-10-CM | POA: Diagnosis not present

## 2017-11-16 NOTE — Progress Notes (Signed)
Remote ICD transmission.   

## 2017-11-17 ENCOUNTER — Encounter: Payer: Self-pay | Admitting: Cardiology

## 2017-12-05 ENCOUNTER — Telehealth: Payer: Self-pay | Admitting: *Deleted

## 2017-12-05 LAB — CUP PACEART REMOTE DEVICE CHECK
Battery Remaining Percentage: 66 %
Date Time Interrogation Session: 20190312090017
HIGH POWER IMPEDANCE MEASURED VALUE: 77 Ohm
HighPow Impedance: 77 Ohm
Implantable Lead Implant Date: 20141128
Implantable Pulse Generator Implant Date: 20141128
Lead Channel Impedance Value: 430 Ohm
Lead Channel Sensing Intrinsic Amplitude: 8.4 mV
Lead Channel Setting Pacing Amplitude: 2.5 V
Lead Channel Setting Pacing Pulse Width: 0.5 ms
MDC IDC LEAD LOCATION: 753860
MDC IDC MSMT BATTERY REMAINING LONGEVITY: 67 mo
MDC IDC MSMT BATTERY VOLTAGE: 2.95 V
MDC IDC MSMT LEADCHNL RV PACING THRESHOLD AMPLITUDE: 0.75 V
MDC IDC MSMT LEADCHNL RV PACING THRESHOLD PULSEWIDTH: 0.5 ms
MDC IDC SET LEADCHNL RV SENSING SENSITIVITY: 0.5 mV
MDC IDC STAT BRADY RV PERCENT PACED: 1 %
Pulse Gen Serial Number: 7136396

## 2017-12-05 NOTE — Telephone Encounter (Signed)
Error

## 2017-12-07 ENCOUNTER — Encounter: Payer: Self-pay | Admitting: Physician Assistant

## 2017-12-13 MED FILL — POTASSIUM CL ER 20 MEQ TABL: 20 | 90 days supply | Qty: 180 | Fill #2

## 2017-12-13 MED FILL — ATORVASTATIN 10 MG TABLET: 10 | 90 days supply | Qty: 90 | Fill #2

## 2017-12-14 DIAGNOSIS — H2513 Age-related nuclear cataract, bilateral: Secondary | ICD-10-CM | POA: Diagnosis not present

## 2017-12-14 DIAGNOSIS — H524 Presbyopia: Secondary | ICD-10-CM | POA: Diagnosis not present

## 2017-12-14 DIAGNOSIS — H5203 Hypermetropia, bilateral: Secondary | ICD-10-CM | POA: Diagnosis not present

## 2017-12-30 MED FILL — AMITRIPTYLINE HCL 25 MG TAB: 25 | 90 days supply | Qty: 270 | Fill #0

## 2017-12-30 MED FILL — hydrALAZINE HCL 25 MG TABS: 25 | 90 days supply | Qty: 180 | Fill #1

## 2017-12-30 MED FILL — CARVEDILOL 12.5 MG TABLET: 12.5 | 90 days supply | Qty: 360 | Fill #1

## 2018-01-03 ENCOUNTER — Other Ambulatory Visit: Payer: Self-pay | Admitting: Physician Assistant

## 2018-01-03 DIAGNOSIS — R21 Rash and other nonspecific skin eruption: Secondary | ICD-10-CM

## 2018-01-03 MED FILL — HYDROCODON-APAP 7.5-325: 7.5-325 | 22 days supply | Qty: 90 | Fill #0

## 2018-01-07 ENCOUNTER — Encounter (HOSPITAL_COMMUNITY): Payer: Self-pay

## 2018-01-07 ENCOUNTER — Emergency Department (HOSPITAL_COMMUNITY): Payer: 59

## 2018-01-07 ENCOUNTER — Other Ambulatory Visit: Payer: Self-pay

## 2018-01-07 ENCOUNTER — Observation Stay (HOSPITAL_COMMUNITY)
Admission: EM | Admit: 2018-01-07 | Discharge: 2018-01-09 | Disposition: A | Discharge status: Home / Self Care | Payer: 59 | Attending: Family Medicine | Admitting: Family Medicine

## 2018-01-07 DIAGNOSIS — Z96643 Presence of artificial hip joint, bilateral: Secondary | ICD-10-CM | POA: Insufficient documentation

## 2018-01-07 DIAGNOSIS — K219 Gastro-esophageal reflux disease without esophagitis: Secondary | ICD-10-CM | POA: Diagnosis not present

## 2018-01-07 DIAGNOSIS — Z79899 Other long term (current) drug therapy: Secondary | ICD-10-CM | POA: Diagnosis not present

## 2018-01-07 DIAGNOSIS — I5022 Chronic systolic (congestive) heart failure: Secondary | ICD-10-CM | POA: Diagnosis not present

## 2018-01-07 DIAGNOSIS — I10 Essential (primary) hypertension: Secondary | ICD-10-CM | POA: Diagnosis present

## 2018-01-07 DIAGNOSIS — I110 Hypertensive heart disease with heart failure: Secondary | ICD-10-CM | POA: Insufficient documentation

## 2018-01-07 DIAGNOSIS — Z6837 Body mass index (BMI) 37.0-37.9, adult: Secondary | ICD-10-CM | POA: Insufficient documentation

## 2018-01-07 DIAGNOSIS — R634 Abnormal weight loss: Secondary | ICD-10-CM | POA: Diagnosis not present

## 2018-01-07 DIAGNOSIS — I071 Rheumatic tricuspid insufficiency: Secondary | ICD-10-CM | POA: Insufficient documentation

## 2018-01-07 DIAGNOSIS — G4730 Sleep apnea, unspecified: Secondary | ICD-10-CM | POA: Insufficient documentation

## 2018-01-07 DIAGNOSIS — Z9884 Bariatric surgery status: Secondary | ICD-10-CM | POA: Insufficient documentation

## 2018-01-07 DIAGNOSIS — Z95810 Presence of automatic (implantable) cardiac defibrillator: Secondary | ICD-10-CM | POA: Diagnosis not present

## 2018-01-07 DIAGNOSIS — I252 Old myocardial infarction: Secondary | ICD-10-CM | POA: Insufficient documentation

## 2018-01-07 DIAGNOSIS — M1990 Unspecified osteoarthritis, unspecified site: Secondary | ICD-10-CM | POA: Insufficient documentation

## 2018-01-07 DIAGNOSIS — I428 Other cardiomyopathies: Secondary | ICD-10-CM

## 2018-01-07 DIAGNOSIS — Z7982 Long term (current) use of aspirin: Secondary | ICD-10-CM | POA: Insufficient documentation

## 2018-01-07 DIAGNOSIS — R55 Syncope and collapse: Principal | ICD-10-CM | POA: Insufficient documentation

## 2018-01-07 DIAGNOSIS — Z8674 Personal history of sudden cardiac arrest: Secondary | ICD-10-CM | POA: Insufficient documentation

## 2018-01-07 DIAGNOSIS — M47814 Spondylosis without myelopathy or radiculopathy, thoracic region: Secondary | ICD-10-CM | POA: Insufficient documentation

## 2018-01-07 DIAGNOSIS — Z888 Allergy status to other drugs, medicaments and biological substances status: Secondary | ICD-10-CM | POA: Insufficient documentation

## 2018-01-07 DIAGNOSIS — Z885 Allergy status to narcotic agent status: Secondary | ICD-10-CM | POA: Diagnosis not present

## 2018-01-07 DIAGNOSIS — I429 Cardiomyopathy, unspecified: Secondary | ICD-10-CM | POA: Diagnosis not present

## 2018-01-07 DIAGNOSIS — R031 Nonspecific low blood-pressure reading: Secondary | ICD-10-CM | POA: Diagnosis not present

## 2018-01-07 LAB — CBC WITH DIFFERENTIAL/PLATELET
BASOS ABS: 0 10*3/uL (ref 0.0–0.1)
Basophils Relative: 0 %
EOS ABS: 0.1 10*3/uL (ref 0.0–0.7)
EOS PCT: 2 %
HCT: 33 % — ABNORMAL LOW (ref 36.0–46.0)
HEMOGLOBIN: 9.9 g/dL — AB (ref 12.0–15.0)
LYMPHS ABS: 1.7 10*3/uL (ref 0.7–4.0)
Lymphocytes Relative: 26 %
MCH: 26.3 pg (ref 26.0–34.0)
MCHC: 30 g/dL (ref 30.0–36.0)
MCV: 87.5 fL (ref 78.0–100.0)
Monocytes Absolute: 0.3 10*3/uL (ref 0.1–1.0)
Monocytes Relative: 4 %
NEUTROS PCT: 68 %
Neutro Abs: 4.5 10*3/uL (ref 1.7–7.7)
PLATELETS: 153 10*3/uL (ref 150–400)
RBC: 3.77 MIL/uL — AB (ref 3.87–5.11)
RDW: 14.5 % (ref 11.5–15.5)
WBC: 6.6 10*3/uL (ref 4.0–10.5)

## 2018-01-07 LAB — COMPREHENSIVE METABOLIC PANEL
ALBUMIN: 3 g/dL — AB (ref 3.5–5.0)
ALK PHOS: 91 U/L (ref 38–126)
ALT: 7 U/L — AB (ref 14–54)
ANION GAP: 8 (ref 5–15)
AST: 11 U/L — ABNORMAL LOW (ref 15–41)
BUN: 19 mg/dL (ref 6–20)
CHLORIDE: 105 mmol/L (ref 101–111)
CO2: 28 mmol/L (ref 22–32)
CREATININE: 1.87 mg/dL — AB (ref 0.44–1.00)
Calcium: 8.7 mg/dL — ABNORMAL LOW (ref 8.9–10.3)
GFR calc non Af Amer: 27 mL/min — ABNORMAL LOW (ref >60)
GFR, EST AFRICAN AMERICAN: 31 mL/min — AB (ref >60)
GLUCOSE: 132 mg/dL — AB (ref 65–99)
Potassium: 3.6 mmol/L (ref 3.5–5.1)
SODIUM: 141 mmol/L (ref 135–145)
Total Bilirubin: 0.6 mg/dL (ref 0.3–1.2)
Total Protein: 7.2 g/dL (ref 6.5–8.1)

## 2018-01-07 LAB — I-STAT TROPONIN, ED: Troponin i, poc: 0 ng/mL (ref 0.00–0.08)

## 2018-01-07 MED ORDER — ACETAMINOPHEN 650 MG RE SUPP: 650.0000 mg | Freq: Four times a day (QID) | RECTAL | Status: DC | PRN

## 2018-01-07 MED ORDER — ONDANSETRON HCL 4 MG PO TABS: 4.0000 mg | ORAL_TABLET | Freq: Four times a day (QID) | ORAL | Status: DC | PRN

## 2018-01-07 MED ORDER — SODIUM CHLORIDE 0.9% FLUSH
3.0000 mL | Freq: Two times a day (BID) | INTRAVENOUS | Status: DC
  Administered 2018-01-08: 3 mL via INTRAVENOUS

## 2018-01-07 MED ORDER — HYDRALAZINE HCL 25 MG PO TABS: 25.0000 mg | ORAL_TABLET | Freq: Two times a day (BID) | ORAL | Status: DC

## 2018-01-07 MED ORDER — LORATADINE 10 MG PO TABS
10.0000 mg | ORAL_TABLET | Freq: Every day | ORAL | Status: DC
  Administered 2018-01-07 – 2018-01-09 (×3): 10 mg via ORAL
  Filled 2018-01-07 (×3): qty 1

## 2018-01-07 MED ORDER — ONDANSETRON HCL 4 MG/2ML IJ SOLN: 4.0000 mg | Freq: Four times a day (QID) | INTRAMUSCULAR | Status: DC | PRN

## 2018-01-07 MED ORDER — ASPIRIN 81 MG PO CHEW
81.0000 mg | CHEWABLE_TABLET | Freq: Every evening | ORAL | Status: DC
  Administered 2018-01-07 – 2018-01-08 (×2): 81 mg via ORAL
  Filled 2018-01-07 (×2): qty 1

## 2018-01-07 MED ORDER — FUROSEMIDE 40 MG PO TABS
40.0000 mg | ORAL_TABLET | Freq: Every morning | ORAL | Status: DC
  Administered 2018-01-07 – 2018-01-09 (×3): 40 mg via ORAL
  Filled 2018-01-07 (×2): qty 1
  Filled 2018-01-07: qty 2

## 2018-01-07 MED ORDER — POTASSIUM CHLORIDE CRYS ER 20 MEQ PO TBCR
20.0000 meq | EXTENDED_RELEASE_TABLET | Freq: Two times a day (BID) | ORAL | Status: DC
Start: 2018-01-07 — End: 2018-01-09
  Administered 2018-01-07 – 2018-01-09 (×5): 20 meq via ORAL
  Filled 2018-01-07 (×5): qty 1

## 2018-01-07 MED ORDER — ENOXAPARIN SODIUM 40 MG/0.4ML ~~LOC~~ SOLN
40.0000 mg | SUBCUTANEOUS | Status: DC
  Administered 2018-01-07 – 2018-01-08 (×2): 40 mg via SUBCUTANEOUS
  Filled 2018-01-07 (×3): qty 0.4

## 2018-01-07 MED ORDER — PANTOPRAZOLE SODIUM 40 MG PO TBEC
40.0000 mg | DELAYED_RELEASE_TABLET | Freq: Every day | ORAL | Status: DC
  Administered 2018-01-07 – 2018-01-09 (×3): 40 mg via ORAL
  Filled 2018-01-07 (×3): qty 1

## 2018-01-07 MED ORDER — ACETAMINOPHEN 325 MG PO TABS
650.0000 mg | ORAL_TABLET | Freq: Four times a day (QID) | ORAL | Status: DC | PRN
  Administered 2018-01-07: 650 mg via ORAL
  Filled 2018-01-07: qty 2

## 2018-01-07 MED ORDER — ISOSORBIDE DINITRATE 20 MG PO TABS
20.0000 mg | ORAL_TABLET | Freq: Two times a day (BID) | ORAL | Status: DC
  Filled 2018-01-07: qty 1

## 2018-01-07 MED ORDER — TRAMADOL HCL 50 MG PO TABS: 50.0000 mg | ORAL_TABLET | Freq: Four times a day (QID) | ORAL | Status: DC | PRN

## 2018-01-07 MED ORDER — SODIUM CHLORIDE 0.9 % IV BOLUS
500.0000 mL | Freq: Once | INTRAVENOUS | Status: AC
  Administered 2018-01-07: 500 mL via INTRAVENOUS

## 2018-01-07 MED ORDER — SODIUM CHLORIDE 0.9 % IV SOLN
INTRAVENOUS | Status: DC
  Administered 2018-01-07: 125 mL/h via INTRAVENOUS
  Administered 2018-01-07 – 2018-01-09 (×4): via INTRAVENOUS

## 2018-01-07 MED ORDER — LOSARTAN POTASSIUM 50 MG PO TABS
50.0000 mg | ORAL_TABLET | Freq: Every day | ORAL | Status: DC
  Administered 2018-01-07 – 2018-01-09 (×3): 50 mg via ORAL
  Filled 2018-01-07 (×3): qty 1

## 2018-01-07 MED ORDER — AMITRIPTYLINE HCL 75 MG PO TABS
75.0000 mg | ORAL_TABLET | Freq: Every day | ORAL | Status: DC
  Administered 2018-01-07 – 2018-01-08 (×2): 75 mg via ORAL
  Filled 2018-01-07 (×3): qty 1

## 2018-01-07 MED ORDER — ATORVASTATIN CALCIUM 10 MG PO TABS
10.0000 mg | ORAL_TABLET | Freq: Every day | ORAL | Status: DC
  Administered 2018-01-07 – 2018-01-08 (×2): 10 mg via ORAL
  Filled 2018-01-07 (×3): qty 1

## 2018-01-07 MED ORDER — HYDROCODONE-ACETAMINOPHEN 7.5-325 MG PO TABS: 1.0000 | ORAL_TABLET | Freq: Three times a day (TID) | ORAL | Status: DC | PRN

## 2018-01-07 MED ORDER — CARVEDILOL 25 MG PO TABS
25.0000 mg | ORAL_TABLET | Freq: Two times a day (BID) | ORAL | Status: DC
  Administered 2018-01-07 – 2018-01-08 (×3): 25 mg via ORAL
  Filled 2018-01-07 (×3): qty 1

## 2018-01-07 NOTE — ED Notes (Signed)
ICD interrogated  

## 2018-01-07 NOTE — Progress Notes (Signed)
Patient admitted from ER holding to room 3E28.  Friend at bedside.  Patient alert and oriented x 4 and oriented to room and unit routines.

## 2018-01-07 NOTE — ED Provider Notes (Signed)
[facility] EMERGENCY DEPARTMENT Provider Note   CSN: [id number] Arrival date & time: 01/07/18  0806     History   Chief Complaint Chief Complaint  Patient presents with  . Near Syncope    HPI Lori Beard is a 67 y.o. female.  HPI Pt presents to the ED for a syncopal episode.  Pt states she had an episode yesterday as well. It happened after getting out of bed and it lasted a few seconds.  She walked to the bathroom and then went to her grandsons room when it occurred.  She had been standing for a few minutes. She felt like she became weak all over.  She did not lose consciousness but she went to her knees.  She was able to get up and continue with the rest of her day.  Today she had another episode similar to yesterday.  She does not think she lost consciousness but was weak and could not get up.  EMS was called.   Pt states she feels fine now  But a little tired.   No CP.  No sob.   No diarrhea.  SHe did have an episode of sharp abdominal pain yesterday but it resolved.  She has had some diarrhea while at bible study 3 times but none since.  No blood in her stool.  No fevers. No prior syncope.  She does have a defibrillator. Past Medical History:  Diagnosis Date  . Arthritis    ALL OVER  . Cardiac arrest ([place]) 08/01/2013   Polymorphic VT  . Chronic systolic CHF (congestive heart failure) ([place])   . GERD (gastroesophageal reflux disease)   . Hypertension   . Morbid obesity ([place])    a. s/p lapband surgery 11/2012.  [name] Myocardial infarction ([place]) 08/06/13  . NICM (nonischemic cardiomyopathy) ([place])    Echocardiogram (08/01/13): Mild LVH, global HK, EF 40-45%, Gr 1 DD, MAC, mild RVE, mildly reduced RVSF, PASP 31-35.  LHC (08/02/13):  Normal coronary arteries, EF 35%, LVEDP 17.  . S/P implantation of automatic cardioverter/defibrillator (AICD)    07/2013 Lovena Le)  . Sleep apnea 02/15/2012   SLEEP STUDY IN EPIC - MILD OSA-CPAP NOT RECOMMENDED-HOME OXYGEN SUGGESTED  BECAUSE OF OXYGEN DESATS ON ROOM AIR.  [name] Ventricular tachycardia (H[facility])    a. R on T PVC during admission for pneumonia/NSTEMI => monomorphic VT=>VF=>defib; s/p ICD    Patient Active Problem List   Diagnosis Date Noted  . Morbid obesity ([place])   . Hypertension   . GERD (gastroesophageal reflux disease)   . Chronic systolic CHF (congestive heart failure) ([place])   . S/P implantation of automatic cardioverter/defibrillator (AICD)   . Myocardial infarction ([place]) 08/06/2013  . NICM (nonischemic cardiomyopathy) ([place]) 08/03/2013  . Ventricular tachycardia arrest 08/02/2013  . Hypokalemia 08/02/2013  . Elevated troponin 08/02/2013  . Pneumonia 08/01/2013  . HTN (hypertension) 08/01/2013  . Anemia 08/01/2013  . Thrombocytopenia ([place]) 08/01/2013  . Renal failure, acute ([place]) 08/01/2013  . Cardiac arrest ([place]) 08/01/2013  . Lapband APS + hiatus hernia repair March 2014 11/23/2012  . Arthritis-bilateral hip replacements 11/10/2012  . Sleep apnea 02/15/2012  . Obesity 01/22/2012    Past Surgical History:  Procedure Laterality Date  . ABDOMINAL HYSTERECTOMY    . BREATH TEK H PYLORI  02/15/2012   Procedure: BREATH TEK H PYLORI;  Surgeon: Pedro Earls, MD;  Location: [facility];  Service: General;  Laterality: N/A;  745  . CARDIAC CATHETERIZATION  08/02/2013   Normal coronary  arteries, EF 35%   . CHOLECYSTECTOMY    . IMPLANTABLE CARDIOVERTER DEFIBRILLATOR IMPLANT N/A 08/04/2013   Procedure: IMPLANTABLE CARDIOVERTER DEFIBRILLATOR IMPLANT;  Surgeon: Evans Lance, MD;  Location[facility] CATH LAB;  Service: Cardiovascular;  Laterality: N/A;  . JOINT REPLACEMENT     BILATERAL TOTAL HIP REPLACEMENTS  . KNEE ARTHROSCOPY    . LAPAROSCOPIC GASTRIC BANDING    . LEFT HEART CATHETERIZATION WITH CORONARY ANGIOGRAM N/A 08/02/2013   Procedure: LEFT HEART CATHETERIZATION WITH CORONARY ANGIOGRAM;  Surgeon: Wellington Hampshire, MD;  Location: [facility] CATH LAB;  Service: Cardiovascular;  Laterality: N/A;  . MESH  APPLIED TO LAP PORT N/A 11/22/2012   Procedure: MESH APPLIED TO LAP PORT;  Surgeon: Pedro Earls, MD;  Location: [facility] ORS;  Service: General;  Laterality: N/A;  . PACEMAKER INSERTION   08/04/2013   [employer] [name] single chamber ICD     OB History   None      Home Medications    Prior to Admission medications   Medication Sig Start Date End Date Taking? Authorizing Provider  amitriptyline (ELAVIL) 25 MG tablet Take 75 mg by mouth at bedtime. 04/12/12  Yes Dunn, Areta Haber, PA-C  aspirin 81 MG tablet Take 1 tablet (81 mg total) by mouth every evening. 10/06/13  Yes Nahser, Wonda Cheng, MD  atorvastatin (LIPITOR) 10 MG tablet TAKE 1 TABLET BY MOUTH ONCE DAILY AT 6 PM 03/31/17  Yes Nahser, Wonda Cheng, MD  carvedilol (COREG) 12.5 MG tablet TAKE 2 TABLETS BY MOUTH TWICE DAILY WITH MEALS 09/13/17  Yes Nahser, Wonda Cheng, MD  cetirizine (ZYRTEC) 5 MG tablet Take 1 tablet (5 mg total) by mouth daily. 05/04/17  Yes Ivar Drape D, PA  furosemide (LASIX) 40 MG tablet Take 1 tablet (40 mg total) by mouth every morning. 05/14/17  Yes Bhagat, Bhavinkumar, PA  hydrALAZINE (APRESOLINE) 25 MG tablet TAKE 1 TABLET BY MOUTH TWICE DAILY 10/01/17  Yes Nahser, Wonda Cheng, MD  HYDROcodone-acetaminophen (NORCO) 7.5-325 MG tablet Take 1 tablet by mouth every 8 (eight) hours as needed for moderate pain or severe pain.  01/07/16  Yes [provider]  hydrOXYzine (ATARAX/VISTARIL) 25 MG tablet Take 25 mg by mouth every 6 (six) hours as needed.   Yes [provider]  isosorbide dinitrate (ISORDIL) 20 MG tablet TAKE 1 TABLET BY MOUTH TWICE DAILY 10/01/17  Yes Evans Lance, MD  losartan (COZAAR) 50 MG tablet Take 1 tablet (50 mg total) by mouth daily. 05/14/17  Yes Bhagat, Bhavinkumar, PA  Multiple Vitamin (MULTIVITAMIN) tablet Take 1 tablet by mouth daily.   Yes [provider]  omeprazole (PRILOSEC) 20 MG capsule Take 1 capsule (20 mg total) by mouth daily. 09/21/17  Yes Evans Lance, MD  potassium chloride SA  (K-DUR,KLOR-CON) 20 MEQ tablet 2 TABS DAILY 05/14/17  Yes Bhagat, Bhavinkumar, PA  Triamcinolone Acetonide (TRIAMCINOLONE 0.1 % CREAM : EUCERIN) CREA Apply 1 application topically 2 (two) times daily. 10/22/15  Yes Sam, Serena Y, PA-C  triamcinolone cream (KENALOG) 0.1 % Apply 1 application topically 2 (two) times daily. 05/04/17  Yes Ivar Drape D, PA    Family History Family History  Problem Relation Age of Onset  . Kidney disease Mother   . Heart disease Father   . Hypertension Father   . Colon cancer Father   . Stroke Paternal Grandmother   . Hypertension Paternal Grandmother   . Breast cancer Sister   . Heart attack Neg Hx     Social History Social  History   Tobacco Use  . Smoking status: Never Smoker  . Smokeless tobacco: Never Used  Substance Use Topics  . Alcohol use: No  . Drug use: No     Allergies   Ace inhibitors; Iron; and Morphine and related   Review of Systems Review of Systems  All other systems reviewed and are negative.    Physical Exam Updated Vital Signs BP 104/73   Pulse 63   Temp 98.6 F (37 C) (Oral)   Resp (!) 0   Ht 1.6 m (_0 )   Wt 93.9 kg (207 lb)   SpO2 97%   BMI 36.67 kg/m   Physical Exam  Constitutional: She appears well-developed and well-nourished. No distress.  HENT:  Head: Normocephalic and atraumatic.  Right Ear: External ear normal.  Left Ear: External ear normal.  Eyes: Conjunctivae are normal. Right eye exhibits no discharge. Left eye exhibits no discharge. No scleral icterus.  Neck: Neck supple. No tracheal deviation present.  Cardiovascular: Normal rate, regular rhythm and intact distal pulses.  Pulmonary/Chest: Effort normal and breath sounds normal. No stridor. No respiratory distress. She has no wheezes. She has no rales.  Abdominal: Soft. Bowel sounds are normal. She exhibits no distension. There is no tenderness. There is no rebound and no guarding.  Musculoskeletal: She exhibits no edema or tenderness.   Neurological: She is alert. She has normal strength. No cranial nerve deficit (no facial droop, extraocular movements intact, no slurred speech) or sensory deficit. She exhibits normal muscle tone. She displays no seizure activity. Coordination normal.  Skin: Skin is warm and dry. No rash noted.  Psychiatric: She has a normal mood and affect.  Nursing note and vitals reviewed.    ED Treatments / Results  Labs (all labs ordered are listed, but only abnormal results are displayed) Labs Reviewed  CBC WITH DIFFERENTIAL/PLATELET - Abnormal; Notable for the following components:      Result Value   RBC 3.77 (*)    Hemoglobin 9.9 (*)    HCT 33.0 (*)    All other components within normal limits  COMPREHENSIVE METABOLIC PANEL - Abnormal; Notable for the following components:   Glucose, Bld 132 (*)    Creatinine, Ser 1.87 (*)    Calcium 8.7 (*)    Albumin 3.0 (*)    AST 11 (*)    ALT 7 (*)    GFR calc non Af Amer 27 (*)    GFR calc Af Amer 31 (*)    All other components within normal limits  I-STAT TROPONIN, ED    EKG EKG Interpretation  Date/Time:  Friday Jan 07 2018 08:24:57 EDT Ventricular Rate:  65 PR Interval:    QRS Duration: 116 QT Interval:  393 QTC Calculation: 409 R Axis:   -23 Text Interpretation:  Sinus rhythm Prolonged PR interval Nonspecific intraventricular conduction delay No significant change since last tracing Confirmed by Dorie Rank [phone]) on 01/07/2018 8:30:23 AM   Radiology Dg Chest Port 1 View  Result Date: 01/07/2018 CLINICAL DATA:  Recurrent syncopal episodes. EXAM: PORTABLE CHEST 1 VIEW COMPARISON:  Radiographs 09/06/2016 and 01/26/2016. FINDINGS: 0811 hours. Left subclavian AICD lead appears unchanged. There is stable cardiomegaly and mild aortic tortuosity. The lungs are clear. There is no pleural effusion or pneumothorax. No acute osseous findings are seen. Glenohumeral degenerative changes and mild thoracic spondylosis are noted. IMPRESSION: Stable  chest.  No acute cardiopulmonary process. Electronically Signed   By: Richardean Sale M.D.   On: 01/07/2018 08:54  Procedures Procedures (including critical care time)  Medications Ordered in ED Medications  0.9 %  sodium chloride infusion ( Intravenous New Bag/Given 01/07/18 0906)     Initial Impression / Assessment and Plan / ED Course  I have reviewed the triage vital signs and the nursing notes.  Pertinent labs & imaging results that were available during my care of the patient were reviewed by me and considered in my medical decision making (see chart for details).  Clinical Course as of Jan 07 1014  Fri Jan 07, 2018  1013 Vitals remain stable.  No recurrent bradycardia noted in the ED.,    [JK]    Clinical Course User Index [JK] Dorie Rank, MD    Pt presents with recurrent syncope.  No history of same.   EMS noted that she was bradycardic en route.  Labs are reassuring.  No clear etiology at this time but symptoms concerning for possible cardiac cause.  Considering the recurrent nature over the last couple of days will consult for admission, monitoring further workup.   Will interrogate her AICD if possible to gather additional information.  Final Clinical Impressions(s) / ED Diagnoses   Final diagnoses:  Syncope and collapse       Dorie Rank, MD 01/07/18 1017

## 2018-01-07 NOTE — ED Notes (Signed)
Report given to [facility] on [facility]. Pt transported via wheelchair per tech; pt has no complaints at this time; caox4; has recently finished meal tray

## 2018-01-07 NOTE — ED Triage Notes (Signed)
Arrived via EMS with complaints of generalized weakness and dizziness upon rising; EMS reports orthostatic when assisting pt to stretcher; additionally, EMS reports transient bradycardia (HR 29) x1 episode which resolved on own

## 2018-01-07 NOTE — ED Notes (Signed)
PCXR done per rad tech

## 2018-01-07 NOTE — H&P (Signed)
History and Physical    Lori Beard NUU:[id number] DOB: 28-Nov-1950 DOA: 01/07/2018  PCP: Alroy Dust, L.Marlou Sa, MD   Patient coming from: home via EMS  I have personally briefly reviewed patient's old medical records in [place]  Chief Complaint:.  Recurrent near syncope x2 since yesterday  HPI: Lori Beard is a 67 y.o. female with medical history significant of cardiac arrest due to polymorphic ventricular tachycardia in 2014 (status post placement of an AICD), gastroesophageal reflux disease, chronic systolic congestive heart failure, morbid obesity (has recently lost 30 pounds intentionally), myocardial infarction, nonischemic cardiomyopathy, sleep apnea (sleep study in epic reveals no CPAP recommended but home oxygen was suggested due to desaturations).  Her AICD was interrogated and there was no episodes of bradycardia noted.  EMS had thought that it seen some bradycardia but this was ruled out with interrogation.  Patient does report feeling poorly she had an episode of diarrhea couple days ago and has been tired.  She also had abdominal pain which has now resolved.  Patient thinks that her medications are too much.  She says that when she got up yesterday morning she felt very dizzy and then fell to her knees same thing happened this morning.  She takes all of her medicines twice a day and she does take a significant amount of medications for blood pressure and rate control including: Carvedilol, hydralazine, furosemide, Imdur, and losartan.  Additionally the patient takes medications that can affect her sensorium including amitriptyline, cetirizine, and hydroxyzine.  Patient denies rashes or masses, dysuria urinary frequency urgency, chest pain, shortness of breath, cough, sputum production, focal localized weakness or numbness or dizziness.  Review of Systems: As per HPI otherwise all other systems reviewed and  negative.    Past Medical History:  Diagnosis Date  .  Arthritis    ALL OVER  . Cardiac arrest ([place]) 08/01/2013   Polymorphic VT  . Chronic systolic CHF (congestive heart failure) ([place])   . GERD (gastroesophageal reflux disease)   . Hypertension   . Morbid obesity ([place])    a. s/p lapband surgery 11/2012.  [name] Myocardial infarction ([place]) 08/06/13  . NICM (nonischemic cardiomyopathy) ([place])    Echocardiogram (08/01/13): Mild LVH, global HK, EF 40-45%, Gr 1 DD, MAC, mild RVE, mildly reduced RVSF, PASP 31-35.  LHC (08/02/13):  Normal coronary arteries, EF 35%, LVEDP 17.  . S/P implantation of automatic cardioverter/defibrillator (AICD)    07/2013 [facility])  . Sleep apnea 02/15/2012   SLEEP STUDY IN EPIC - MILD OSA-CPAP NOT RECOMMENDED-HOME OXYGEN SUGGESTED BECAUSE OF OXYGEN DESATS ON ROOM AIR.  [name] Ventricular tachycardia (HCC)    a. R on T PVC during admission for pneumonia/NSTEMI => monomorphic VT=>VF=>defib; s/p ICD    Past Surgical History:  Procedure Laterality Date  . ABDOMINAL HYSTERECTOMY    . BREATH TEK H PYLORI  02/15/2012   Procedure: BREATH TEK H PYLORI;  Surgeon: Pedro Earls, MD;  Location: [facility];  Service: General;  Laterality: N/A;  745  . CARDIAC CATHETERIZATION  08/02/2013   Normal coronary arteries, EF 35%   . CHOLECYSTECTOMY    . IMPLANTABLE CARDIOVERTER DEFIBRILLATOR IMPLANT N/A 08/04/2013   Procedure: IMPLANTABLE CARDIOVERTER DEFIBRILLATOR IMPLANT;  Surgeon: Evans Lance, MD;  Location: [facility] CATH LAB;  Service: Cardiovascular;  Laterality: N/A;  . JOINT REPLACEMENT     BILATERAL TOTAL HIP REPLACEMENTS  . KNEE ARTHROSCOPY    . LAPAROSCOPIC GASTRIC BANDING    . LEFT HEART CATHETERIZATION WITH CORONARY ANGIOGRAM N/A  08/02/2013   Procedure: LEFT HEART CATHETERIZATION WITH CORONARY ANGIOGRAM;  Surgeon: Wellington Hampshire, MD;  Location: [facility] CATH LAB;  Service: Cardiovascular;  Laterality: N/A;  . MESH APPLIED TO LAP PORT N/A 11/22/2012   Procedure: MESH APPLIED TO LAP PORT;  Surgeon: Pedro Earls, MD;  Location: [facility]  ORS;  Service: General;  Laterality: N/A;  . PACEMAKER INSERTION   08/04/2013   St. [name] single chamber ICD     reports that she has never smoked. She has never used smokeless tobacco. She reports that she does not drink alcohol or use drugs.  Allergies  Allergen Reactions  . Ace Inhibitors Cough  . Iron Other (See Comments)    Gives patient migraine headaches when supplemented  . Morphine And Related Itching and Rash    Family History  Problem Relation Age of Onset  . Kidney disease Mother   . Heart disease Father   . Hypertension Father   . Colon cancer Father   . Stroke Paternal Grandmother   . Hypertension Paternal Grandmother   . Breast cancer Sister   . Heart attack Neg Hx     Prior to Admission medications   Medication Sig Start Date End Date Taking? Authorizing Provider  amitriptyline (ELAVIL) 25 MG tablet Take 75 mg by mouth at bedtime. 04/12/12  Yes Dunn, Areta Haber, PA-C  aspirin 81 MG tablet Take 1 tablet (81 mg total) by mouth every evening. 10/06/13  Yes Nahser, Wonda Cheng, MD  atorvastatin (LIPITOR) 10 MG tablet TAKE 1 TABLET BY MOUTH ONCE DAILY AT 6 PM 03/31/17  Yes Nahser, Wonda Cheng, MD  carvedilol (COREG) 12.5 MG tablet TAKE 2 TABLETS BY MOUTH TWICE DAILY WITH MEALS 09/13/17  Yes Nahser, Wonda Cheng, MD  cetirizine (ZYRTEC) 5 MG tablet Take 1 tablet (5 mg total) by mouth daily. 05/04/17  Yes Ivar Drape D, PA  furosemide (LASIX) 40 MG tablet Take 1 tablet (40 mg total) by mouth every morning. 05/14/17  Yes Bhagat, Bhavinkumar, PA  hydrALAZINE (APRESOLINE) 25 MG tablet TAKE 1 TABLET BY MOUTH TWICE DAILY 10/01/17  Yes Nahser, Wonda Cheng, MD  HYDROcodone-acetaminophen (NORCO) 7.5-325 MG tablet Take 1 tablet by mouth every 8 (eight) hours as needed for moderate pain or severe pain.  01/07/16  Yes [provider]  hydrOXYzine (ATARAX/VISTARIL) 25 MG tablet Take 25 mg by mouth every 6 (six) hours as needed.   Yes [provider]  isosorbide dinitrate (ISORDIL) 20 MG  tablet TAKE 1 TABLET BY MOUTH TWICE DAILY 10/01/17  Yes Evans Lance, MD  losartan (COZAAR) 50 MG tablet Take 1 tablet (50 mg total) by mouth daily. 05/14/17  Yes Bhagat, Bhavinkumar, PA  Multiple Vitamin (MULTIVITAMIN) tablet Take 1 tablet by mouth daily.   Yes [provider]  omeprazole (PRILOSEC) 20 MG capsule Take 1 capsule (20 mg total) by mouth daily. 09/21/17  Yes Evans Lance, MD  potassium chloride SA (K-DUR,KLOR-CON) 20 MEQ tablet 2 TABS DAILY 05/14/17  Yes Bhagat, Bhavinkumar, PA  Triamcinolone Acetonide (TRIAMCINOLONE 0.1 % CREAM : EUCERIN) CREA Apply 1 application topically 2 (two) times daily. 10/22/15  Yes Sam, Serena Y, PA-C  triamcinolone cream (KENALOG) 0.1 % Apply 1 application topically 2 (two) times daily. 05/04/17  Yes Ivar Drape D, [place]    Physical Exam: Vitals:   01/07/18 0930 01/07/18 1000 01/07/18 1045 01/07/18 1100  BP: 112/71 103/73 118/70 105/66  Pulse:      Resp: 14 13 (!) 9 15  Temp:  TempSrc:      SpO2:      Weight:      Height:       .TCS Constitutional: NAD, calm, comfortable Vitals:   01/07/18 0930 01/07/18 1000 01/07/18 1045 01/07/18 1100  BP: 112/71 103/73 118/70 105/66  Pulse:      Resp: 14 13 (!) 9 15  Temp:      TempSrc:      SpO2:      Weight:      Height:       Eyes: PERRL, lids and conjunctivae normal ENMT: Mucous membranes are moist. Posterior pharynx clear of any exudate or lesions.Normal dentition.  Neck: normal, supple, no masses, no thyromegaly Respiratory: clear to auscultation bilaterally, no wheezing, no crackles. Normal respiratory effort. No accessory muscle use.  Cardiovascular: Regular rate and rhythm, no murmurs / rubs / gallops. No extremity edema. 2+ pedal pulses. No carotid bruits.  Abdomen: no tenderness, no masses palpated. No hepatosplenomegaly. Bowel sounds positive.  Musculoskeletal: no clubbing / cyanosis. No joint deformity upper and lower extremities. Good ROM, no contractures. Normal muscle  tone.  Skin: no rashes, lesions, ulcers. No induration Neurologic: CN 2-12 grossly intact. Sensation intact, DTR normal. Strength 5/5 in all 4.  Psychiatric: Normal judgment and insight. Alert and oriented x 3. Normal mood.     Labs on Admission: I have personally reviewed following labs and imaging studies  CBC: Recent Labs  Lab 01/07/18 0846  WBC 6.6  NEUTROABS 4.5  HGB 9.9*  HCT 33.0*  MCV 87.5  PLT 409   Basic Metabolic Panel: Recent Labs  Lab 01/07/18 0846  NA 141  K 3.6  CL 105  CO2 28  GLUCOSE 132*  BUN 19  CREATININE 1.87*  CALCIUM 8.7*   GFR: Estimated Creatinine Clearance: 31.8 mL/min (A) (by C-G formula based on SCr of 1.87 mg/dL (H)). Liver Function Tests: Recent Labs  Lab 01/07/18 0846  AST 11*  ALT 7*  ALKPHOS 91  BILITOT 0.6  PROT 7.2  ALBUMIN 3.0*   Urine analysis:    Component Value Date/Time   COLORURINE YELLOW 08/01/2013 0202   APPEARANCEUR CLOUDY (A) 08/01/2013 0202   LABSPEC 1.024 08/01/2013 0202   PHURINE 5.5 08/01/2013 0202   GLUCOSEU NEGATIVE 08/01/2013 0202   HGBUR NEGATIVE 08/01/2013 0202   BILIRUBINUR MODERATE (A) 08/01/2013 0202   KETONESUR 15 (A) 08/01/2013 0202   PROTEINUR 100 (A) 08/01/2013 0202   UROBILINOGEN 2.0 (H) 08/01/2013 0202   NITRITE NEGATIVE 08/01/2013 0202   LEUKOCYTESUR MODERATE (A) 08/01/2013 0202    Radiological Exams on Admission: Dg Chest Port 1 View  Result Date: 01/07/2018 CLINICAL DATA:  Recurrent syncopal episodes. EXAM: PORTABLE CHEST 1 VIEW COMPARISON:  Radiographs 09/06/2016 and 01/26/2016. FINDINGS: 0811 hours. Left subclavian AICD lead appears unchanged. There is stable cardiomegaly and mild aortic tortuosity. The lungs are clear. There is no pleural effusion or pneumothorax. No acute osseous findings are seen. Glenohumeral degenerative changes and mild thoracic spondylosis are noted. IMPRESSION: Stable chest.  No acute cardiopulmonary process. Electronically Signed   By: Richardean Sale M.D.    On: 01/07/2018 08:54    EKG: Independently reviewed.  Sinus rhythm at 65 bpm compared to 01/26/2016 junctional rhythm has been replaced by sinus rhythm.  Assessment/Plan Principal Problem:   Near syncope Active Problems:   Weight loss   HTN (hypertension)   Chronic systolic CHF (congestive heart failure) (HCC)   Morbid obesity (HCC)   NICM (nonischemic cardiomyopathy) (HCC)   S/P implantation  of automatic cardioverter/defibrillator (AICD)   Sleep apnea    1.  Near Syncope: Patient with 2 episodes of weakness associated with near syncope.  Both of those episodes were associated with arising early in the morning immediately after having taken her medications.  Patient states that as soon as she takes her medicines about 10 minutes later she feels dizzy.  This may be associated with the fact that she has had a significant weight loss of about 30 pounds which has been intentional.  She is obese and is working on losing weight.  Her blood pressure medication requirements may have significantly decreased.  In addition she takes certain other medications which can cloud her sensorium and perhaps contributing to dizziness.  Therefore I am going to hold her hydralazine and her Imdur.  Believe these are the safest medications to hold as the patient is already tolerating an ARB.  Last echocardiogram that we have recorded his back in 2014 therefore I will order a repeat echocardiogram.  2.  Weight loss: This may be contributing to lower blood pressures causing the patient to require less blood pressure medication.  Given her age, her blood pressures appear to be too low for her given recent recommendations.  I did encourage the patient to continue with her weight loss as her BMI is now down to 36.6 and she is no longer super morbidly obese.  3.:  Hypertension: Patient's blood pressure is currently very well controlled.  Will decrease medications as above.  4.  Chronic systolic congestive heart failure:  Patient currently tolerating Coreg, Lasix, and losartan.  Will discontinue Imdur and hydralazine.  We will continue Lasix.  Will continue atorvastatin.  5.  Morbid obesity: Patient's continued weight loss is showing positive effects.  I have encouraged her to continue with her diet.  6.  Nonischemic cardiomyopathy: Last echocardiogram that we have was back in 2014 we will recheck another one today.  7.  Status post implantation of AICD: Interrogation today shows that it is functioning well and that she did not have any bradycardia per the emergency department physician.  8.  Sleep apnea: Patient was diagnosed with sleep apnea but recommendation was not for CPAP but rather home oxygen.  Will need to check if she is receiving oxygen at home.    DVT prophylaxis: Lovenox Code Status: *Full code Family Communication: No family present at the time of evaluation. Disposition Plan: *Likely home in 48 hours Consults called: None Admission status: Observation   Lady Deutscher MD [facility] Pager [phone]  If 7PM-7AM, please contact night-coverage [email or web address] Password [email or web address]  01/07/2018, 1:30 PM

## 2018-01-07 NOTE — ED Notes (Signed)
Pt ambulated to restroom. Stand by assist for safety. Pt had steady slow gait. Tolerated well

## 2018-01-07 NOTE — ED Notes (Signed)
Sent email at 1545 to order dinner tray. Called [facility]  at 1545 to confirm email was received and spoke to Carolyn. She stated the orders had already been put in, by the kitchen.    

## 2018-01-07 NOTE — ED Notes (Signed)
Spoke with Arlys John at [place]. [name]'s in reference to AICD interrogation; states everything looks fine; single chamber; pacemaker set at 40, defib set at 187; no evidence of issues - ED MD made aware of same

## 2018-01-08 ENCOUNTER — Observation Stay (HOSPITAL_BASED_OUTPATIENT_CLINIC_OR_DEPARTMENT_OTHER): Payer: 59

## 2018-01-08 ENCOUNTER — Encounter (HOSPITAL_COMMUNITY): Payer: Self-pay | Admitting: *Deleted

## 2018-01-08 DIAGNOSIS — K219 Gastro-esophageal reflux disease without esophagitis: Secondary | ICD-10-CM | POA: Diagnosis not present

## 2018-01-08 DIAGNOSIS — M1990 Unspecified osteoarthritis, unspecified site: Secondary | ICD-10-CM | POA: Diagnosis not present

## 2018-01-08 DIAGNOSIS — I361 Nonrheumatic tricuspid (valve) insufficiency: Secondary | ICD-10-CM

## 2018-01-08 DIAGNOSIS — I5022 Chronic systolic (congestive) heart failure: Secondary | ICD-10-CM | POA: Diagnosis not present

## 2018-01-08 DIAGNOSIS — R55 Syncope and collapse: Principal | ICD-10-CM

## 2018-01-08 DIAGNOSIS — G4730 Sleep apnea, unspecified: Secondary | ICD-10-CM | POA: Diagnosis not present

## 2018-01-08 DIAGNOSIS — I071 Rheumatic tricuspid insufficiency: Secondary | ICD-10-CM | POA: Diagnosis not present

## 2018-01-08 DIAGNOSIS — I429 Cardiomyopathy, unspecified: Secondary | ICD-10-CM | POA: Diagnosis not present

## 2018-01-08 DIAGNOSIS — R634 Abnormal weight loss: Secondary | ICD-10-CM | POA: Diagnosis not present

## 2018-01-08 DIAGNOSIS — I110 Hypertensive heart disease with heart failure: Secondary | ICD-10-CM | POA: Diagnosis not present

## 2018-01-08 LAB — BASIC METABOLIC PANEL
Anion gap: 7 (ref 5–15)
BUN: 20 mg/dL (ref 6–20)
CALCIUM: 8.3 mg/dL — AB (ref 8.9–10.3)
CHLORIDE: 106 mmol/L (ref 101–111)
CO2: 28 mmol/L (ref 22–32)
CREATININE: 1.68 mg/dL — AB (ref 0.44–1.00)
GFR calc non Af Amer: 30 mL/min — ABNORMAL LOW (ref >60)
GFR, EST AFRICAN AMERICAN: 35 mL/min — AB (ref >60)
Glucose, Bld: 122 mg/dL — ABNORMAL HIGH (ref 65–99)
Potassium: 4.4 mmol/L (ref 3.5–5.1)
SODIUM: 141 mmol/L (ref 135–145)

## 2018-01-08 LAB — ECHOCARDIOGRAM COMPLETE
HEIGHTINCHES: 63 in
WEIGHTICAEL: 3283.2 [oz_av]

## 2018-01-08 LAB — GLUCOSE, CAPILLARY: GLUCOSE-CAPILLARY: 108 mg/dL — AB (ref 65–99)

## 2018-01-08 MED ORDER — CARVEDILOL 12.5 MG PO TABS
12.5000 mg | ORAL_TABLET | Freq: Two times a day (BID) | ORAL | Status: DC
  Administered 2018-01-09: 12.5 mg via ORAL
  Filled 2018-01-08: qty 1

## 2018-01-08 NOTE — Progress Notes (Addendum)
Patient Demographics:    Lori Beard, is a 67 y.o. female, DOB - Jan 20, 1951, ZOX:[id number]  Admit date - 01/07/2018   Admitting Physician Lori Crocker, MD  Outpatient Primary MD for the patient is Lori Beard, L.August Saucer, MD  LOS - 0   Chief Complaint  Patient presents with  . Near Syncope        Subjective:    Lori Beard today has no fevers, no emesis,  No chest pain, no significant shortness of breath  Assessment  & Plan :    Principal Problem:   Near syncope Active Problems:   HTN (hypertension)   NICM (nonischemic cardiomyopathy) (HCC)   Chronic systolic CHF (congestive heart failure) (HCC)   S/P implantation of automatic cardioverter/defibrillator (AICD)   Sleep apnea   Morbid obesity (HCC)   Weight loss  Brief summary:-  67 y.o. female with medical history significant of cardiac arrest due to polymorphic ventricular tachycardia in 2014 (status post placement of an AICD), gastroesophageal reflux disease, chronic systolic congestive heart failure, morbid obesity (has recently lost 60 pounds intentionally/LAP-BAND/gastric sleeve procedure), myocardial infarction, nonischemic cardiomyopathy, sleep apnea (sleep study in epic reveals no CPAP recommended but home oxygen was suggested due to desaturations).  Her AICD was interrogated and there was no episodes of bradycardia noted admitted on 01/07/2018 after nearsyncopal episode  Echo- echocardiogram reveals EF is now up to 50 to 55% with essentially normal systolic function however patient does have akinesis of the apex    1)Near Syncope-  telemetry monitored unit, watch for arrhythmias, check serial troponins and EKG for rule out ACS protocol, check echocardiogram to rule out significant aortic stenosis or other outflow obstruction, and also to evaluate EF and to rule out segmental/Regional wall motion abnormalities.  Continue carotid artery  Dopplers to rule out hemodynamically significant stenosis   2) morbid obesity /wt loss- has recently lost 60 pounds intentionally/LAP-BAND/gastric sleeve procedure,   3)HTN-BP control has improved since weight loss, medications have to be adjusted downward, isosorbide/hydralazine has been discontinued especially given the patient's EF is 50 to 55%, decrease Coreg to 12.5 mg twice daily from 25 mg, continue losartan 50 mg daily  4)HFpEF/nonischemic cardiomyopathy-patient has history of chronic systolic dysfunction CHF, however current echocardiogram reveals EF is now up to 50 to 55% with essentially normal systolic function however patient does have akinesis of the apex, medication changes as above #3,  Status post implantation of AICD: Interrogation today shows that it is functioning well , no significant arrhythmias specifically no significant bradycardia  5)Sleep apnea: Patient was diagnosed with sleep apnea but recommendation was not for CPAP but rather home oxygen.  Will need to check if she is receiving oxygen at home.   Code Status : Full   Disposition Plan  : home   Consults  :  /   DVT Prophylaxis  :  Lovenox   Lab Results  Component Value Date   PLT 153 01/07/2018    Inpatient Medications  Scheduled Meds: . amitriptyline  75 mg Oral QHS  . aspirin  81 mg Oral QPM  . atorvastatin  10 mg Oral q1800  . carvedilol  25 mg Oral BID WC  . enoxaparin (LOVENOX) injection  40 mg Subcutaneous Q24H  . furosemide  40 mg Oral q morning - 10a  . loratadine  10 mg Oral Daily  . losartan  50 mg Oral Daily  . pantoprazole  40 mg Oral Daily  . potassium chloride SA  20 mEq Oral BID  . sodium chloride flush  3 mL Intravenous Q12H   Continuous Infusions: . sodium chloride 125 mL/hr at 01/08/18 0329   PRN Meds:.acetaminophen **OR** acetaminophen, HYDROcodone-acetaminophen, ondansetron **OR** ondansetron (ZOFRAN) IV, traMADol    Anti-infectives (From admission, onward)   None         Objective:   Vitals:   01/07/18 1933 01/08/18 0029 01/08/18 0609 01/08/18 1209  BP: 119/68 121/67 111/77 106/70  Pulse: 73 70 77 (!) 51  Resp: 16 16 18 20   Temp: 98.1 F (36.7 C) 98.7 F (37.1 C) 98.2 F (36.8 C) 97.7 F (36.5 C)  TempSrc: Oral Oral Oral Oral  SpO2: 97% 97% 98% 97%  Weight:   93.1 kg (205 lb 3.2 oz)   Height:        Wt Readings from Last 3 Encounters:  01/08/18 93.1 kg (205 lb 3.2 oz)  09/21/17 93.4 kg (206 lb)  05/14/17 95.6 kg (210 lb 12.8 oz)     Intake/Output Summary (Last 24 hours) at 01/08/2018 1726 Last data filed at 01/08/2018 1300 Gross per 24 hour  Intake 2285 ml  Output 1500 ml  Net 785 ml     Physical Exam  Gen:- Awake Alert,  In no apparent distress  HEENT:- [place].AT, No sclera icterus Neck-Supple Neck,No JVD,.  Lungs-  CTAB , good air movement CV- S1, S2 normal, CABG scar Abd-  +ve B.Sounds, Abd Soft, No tenderness,    Extremity/Skin:- No  edema,   Good pulses Psych-affect is appropriate, oriented x3 Neuro-no new focal deficits, no tremors   Data Review:   Micro Results No results found for this or any previous visit (from the past 240 hour(s)).  Radiology Reports Dg Chest Port 1 View  Result Date: 01/07/2018 CLINICAL DATA:  Recurrent syncopal episodes. EXAM: PORTABLE CHEST 1 VIEW COMPARISON:  Radiographs 09/06/2016 and 01/26/2016. FINDINGS: 0811 hours. Left subclavian AICD lead appears unchanged. There is stable cardiomegaly and mild aortic tortuosity. The lungs are clear. There is no pleural effusion or pneumothorax. No acute osseous findings are seen. Glenohumeral degenerative changes and mild thoracic spondylosis are noted. IMPRESSION: Stable chest.  No acute cardiopulmonary process. Electronically Signed   By: Carey Bullocks M.D.   On: 01/07/2018 08:54     CBC Recent Labs  Lab 01/07/18 0846  WBC 6.6  HGB 9.9*  HCT 33.0*  PLT 153  MCV 87.5  MCH 26.3  MCHC 30.0  RDW 14.5  LYMPHSABS 1.7  MONOABS 0.3  EOSABS 0.1   BASOSABS 0.0    Chemistries  Recent Labs  Lab 01/07/18 0846 01/08/18 0535  NA 141 141  K 3.6 4.4  CL 105 106  CO2 28 28  GLUCOSE 132* 122*  BUN 19 20  CREATININE 1.87* 1.68*  CALCIUM 8.7* 8.3*  AST 11*  --   ALT 7*  --   ALKPHOS 91  --   BILITOT 0.6  --    ------------------------------------------------------------------------------------------------------------------ No results for input(s): CHOL, HDL, LDLCALC, TRIG, CHOLHDL, LDLDIRECT in the last 72 hours.  No results found for: HGBA1C ------------------------------------------------------------------------------------------------------------------ No results for input(s): TSH, T4TOTAL, T3FREE, THYROIDAB in the last 72 hours.  Invalid input(s): FREET3 ------------------------------------------------------------------------------------------------------------------ No results for input(s): VITAMINB12, FOLATE, FERRITIN, TIBC, IRON, RETICCTPCT in the last 72 hours.  Coagulation  profile No results for input(s): INR, PROTIME in the last 168 hours.  No results for input(s): DDIMER in the last 72 hours.  Cardiac Enzymes No results for input(s): CKMB, TROPONINI, MYOGLOBIN in the last 168 hours.  Invalid input(s): CK ------------------------------------------------------------------------------------------------------------------ No results found for: BNP   Shon Hale M.D on 01/08/2018 at 5:26 PM  Between 7am to 7pm - Pager - [phone]  After 7pm go to [email or web address] - password [email or web address]  [facility] -  Office  [phone]   Voice Recognition Reubin Milan dictation system was used to create this note, attempts have been made to correct errors. Please contact the author with questions and/or clarifications.

## 2018-01-08 NOTE — Progress Notes (Signed)
  Echocardiogram 2D Echocardiogram has been performed.  [name] T [name] 01/08/2018, 2:34 PM

## 2018-01-08 NOTE — Plan of Care (Signed)
  Problem: Nutrition: Goal: Adequate nutrition will be maintained Outcome: Completed/Met   Problem: Coping: Goal: Level of anxiety will decrease Outcome: Completed/Met   

## 2018-01-09 DIAGNOSIS — I071 Rheumatic tricuspid insufficiency: Secondary | ICD-10-CM | POA: Diagnosis not present

## 2018-01-09 DIAGNOSIS — K219 Gastro-esophageal reflux disease without esophagitis: Secondary | ICD-10-CM | POA: Diagnosis not present

## 2018-01-09 DIAGNOSIS — M1990 Unspecified osteoarthritis, unspecified site: Secondary | ICD-10-CM | POA: Diagnosis not present

## 2018-01-09 DIAGNOSIS — I110 Hypertensive heart disease with heart failure: Secondary | ICD-10-CM | POA: Diagnosis not present

## 2018-01-09 DIAGNOSIS — R55 Syncope and collapse: Secondary | ICD-10-CM | POA: Diagnosis not present

## 2018-01-09 DIAGNOSIS — G4730 Sleep apnea, unspecified: Secondary | ICD-10-CM | POA: Diagnosis not present

## 2018-01-09 DIAGNOSIS — R634 Abnormal weight loss: Secondary | ICD-10-CM | POA: Diagnosis not present

## 2018-01-09 DIAGNOSIS — I5022 Chronic systolic (congestive) heart failure: Secondary | ICD-10-CM | POA: Diagnosis not present

## 2018-01-09 DIAGNOSIS — I429 Cardiomyopathy, unspecified: Secondary | ICD-10-CM | POA: Diagnosis not present

## 2018-01-09 LAB — BASIC METABOLIC PANEL
ANION GAP: 6 (ref 5–15)
BUN: 10 mg/dL (ref 6–20)
CALCIUM: 8.4 mg/dL — AB (ref 8.9–10.3)
CO2: 28 mmol/L (ref 22–32)
Chloride: 110 mmol/L (ref 101–111)
Creatinine, Ser: 1.28 mg/dL — ABNORMAL HIGH (ref 0.44–1.00)
GFR, EST AFRICAN AMERICAN: 49 mL/min — AB (ref >60)
GFR, EST NON AFRICAN AMERICAN: 42 mL/min — AB (ref >60)
GLUCOSE: 87 mg/dL (ref 65–99)
POTASSIUM: 3.9 mmol/L (ref 3.5–5.1)
Sodium: 144 mmol/L (ref 135–145)

## 2018-01-09 LAB — GLUCOSE, CAPILLARY: GLUCOSE-CAPILLARY: 112 mg/dL — AB (ref 65–99)

## 2018-01-09 MED ORDER — LOSARTAN POTASSIUM 50 MG PO TABS: 50.0000 mg | ORAL_TABLET | Freq: Every day | ORAL | 2 refills | Status: AC

## 2018-01-09 MED ORDER — FUROSEMIDE 40 MG PO TABS: 20.0000 mg | ORAL_TABLET | Freq: Every morning | ORAL | 3 refills | Status: DC

## 2018-01-09 NOTE — Discharge Instructions (Signed)
1)Please note that there have been several changes to your medication regimen, please take medications as prescribed upon discharge today, not as you previously did prior to admission to the hospital 2)Patient was diagnosed with sleep apnea but recommendation was not for CPAP but rather home oxygen at night--- patient has not been using Oxygen at night, please talk to your regular doctor as you may need home oxygen at night 3)Your regular doctor may consider referring you to a lung specialist/pulmonologist

## 2018-01-09 NOTE — Plan of Care (Signed)
  Problem: Elimination: Goal: Will not experience complications related to bowel motility Outcome: Completed/Met   Problem: Pain Managment: Goal: General experience of comfort will improve Outcome: Completed/Met   Problem: Skin Integrity: Goal: Risk for impaired skin integrity will decrease Outcome: Completed/Met

## 2018-01-09 NOTE — Discharge Summary (Signed)
Lori Beard, is a 67 y.o. female  DOB 07-May-1951  MRN [id number].  Admission date:  01/07/2018  Admitting Physician  Lady Deutscher, MD  Discharge Date:  01/09/2018   Primary MD  Aurea Graff.Marlou Sa, MD  Recommendations for primary care physician for things to follow:   1)Please note that there have been several changes to your medication regimen, please take medications as prescribed upon discharge today, not as you previously did prior to admission to the hospital 2)Patient was diagnosed with sleep apnea but recommendation was not for CPAP but rather home oxygen at night--- patient has not been using Oxygen at night, please talk to your regular doctor as you may need home oxygen at night 3)Your regular doctor may consider referring you to a lung specialist/pulmonologist   Admission Diagnosis  Syncope and collapse [R55]  Discharge Diagnosis  Syncope and collapse [R55]   Principal Problem:   Near syncope Active Problems:   HTN (hypertension)   NICM (nonischemic cardiomyopathy) ([place])   Chronic systolic CHF (congestive heart failure) (HCC)   S/P implantation of automatic cardioverter/defibrillator (AICD)   Sleep apnea   Morbid obesity ([place])   Weight loss      Past Medical History:  Diagnosis Date  . Arthritis    ALL OVER  . Cardiac arrest ([place]) 08/01/2013   Polymorphic VT  . Chronic systolic CHF (congestive heart failure) ([place])   . GERD (gastroesophageal reflux disease)   . Hypertension   . Morbid obesity ([place])    a. s/p lapband surgery 11/2012.  [name] Myocardial infarction ([place]) 08/06/13  . NICM (nonischemic cardiomyopathy) ([place])    Echocardiogram (08/01/13): Mild LVH, global HK, EF 40-45%, Gr 1 DD, MAC, mild RVE, mildly reduced RVSF, PASP 31-35.  LHC (08/02/13):  Normal coronary arteries, EF 35%, LVEDP 17.  . S/P implantation of automatic cardioverter/defibrillator (AICD)    07/2013 Lovena Le)   . Sleep apnea 02/15/2012   SLEEP STUDY IN EPIC - MILD OSA-CPAP NOT RECOMMENDED-HOME OXYGEN SUGGESTED BECAUSE OF OXYGEN DESATS ON ROOM AIR.  [name] Ventricular tachycardia (HCC)    a. R on T PVC during admission for pneumonia/NSTEMI => monomorphic VT=>VF=>defib; s/p ICD    Past Surgical History:  Procedure Laterality Date  . ABDOMINAL HYSTERECTOMY    . BREATH TEK H PYLORI  02/15/2012   Procedure: BREATH TEK H PYLORI;  Surgeon: Pedro Earls, MD;  Location: [facility];  Service: General;  Laterality: N/A;  745  . CARDIAC CATHETERIZATION  08/02/2013   Normal coronary arteries, EF 35%   . CHOLECYSTECTOMY    . IMPLANTABLE CARDIOVERTER DEFIBRILLATOR IMPLANT N/A 08/04/2013   Procedure: IMPLANTABLE CARDIOVERTER DEFIBRILLATOR IMPLANT;  Surgeon: Evans Lance, MD;  Location: [facility] CATH LAB;  Service: Cardiovascular;  Laterality: N/A;  . JOINT REPLACEMENT     BILATERAL TOTAL HIP REPLACEMENTS  . KNEE ARTHROSCOPY    . LAPAROSCOPIC GASTRIC BANDING    . LEFT HEART CATHETERIZATION WITH CORONARY ANGIOGRAM N/A 08/02/2013   Procedure: LEFT HEART CATHETERIZATION WITH CORONARY ANGIOGRAM;  Surgeon: Mertie Clause  Fletcher Anon, MD;  Location: [facility] CATH LAB;  Service: Cardiovascular;  Laterality: N/A;  . MESH APPLIED TO LAP PORT N/A 11/22/2012   Procedure: MESH APPLIED TO LAP PORT;  Surgeon: Pedro Earls, MD;  Location: [facility];  Service: General;  Laterality: N/A;  . PACEMAKER INSERTION   08/04/2013   St. [name] single chamber ICD       HPI  from the history and physical done on the day of admission:    Chief Complaint:.  Recurrent near syncope x2 since yesterday  HPI: Lori Beard is a 67 y.o. female with medical history significant of cardiac arrest due to polymorphic ventricular tachycardia in 2014 (status post placement of an AICD), gastroesophageal reflux disease, chronic systolic congestive heart failure, morbid obesity (has recently lost 30 pounds intentionally), myocardial infarction, nonischemic  cardiomyopathy, sleep apnea (sleep study in epic reveals no CPAP recommended but home oxygen was suggested due to desaturations).  Her AICD was interrogated and there was no episodes of bradycardia noted.  EMS had thought that it seen some bradycardia but this was ruled out with interrogation.  Patient does report feeling poorly she had an episode of diarrhea couple days ago and has been tired.  She also had abdominal pain which has now resolved.  Patient thinks that her medications are too much.  She says that when she got up yesterday morning she felt very dizzy and then fell to her knees same thing happened this morning.  She takes all of her medicines twice a day and she does take a significant amount of medications for blood pressure and rate control including: Carvedilol, hydralazine, furosemide, Imdur, and losartan.  Additionally the patient takes medications that can affect her sensorium including amitriptyline, cetirizine, and hydroxyzine.  Patient denies rashes or masses, dysuria urinary frequency urgency, chest pain, shortness of breath, cough, sputum production, focal localized weakness or numbness or dizziness.   Hospital Course:    Brief summary:- 67 y.o.femalewith medical history significant ofcardiac arrest due to polymorphic ventricular tachycardia in 2014 (status post placement of an AICD), gastroesophageal reflux disease, chronic systolic congestive heart failure, morbid obesity (has recently lost 60 pounds intentionally/LAP-BAND/gastric sleeve procedure), myocardial infarction, nonischemic cardiomyopathy, sleep apnea (sleep study in epic reveals no CPAPrecommended but home oxygen was suggested due to desaturations).Her AICD was interrogated and there was no episodes of bradycardia noted admitted on 01/07/2018 after nearsyncopal episode  Echo- echocardiogram reveals EF is now up to 50 to 55% with essentially normal systolic function however patient does have akinesis of the  apex   Plan:- 1)Near Syncope-  telemetry monitored unit w/o significant arrhythmias,  serial troponins noted, and EKG for rule out ACS protocol, echocardiogram without significant aortic stenosis or other outflow obstruction, and  EF is 50 to 55%,    2) morbid obesity /wt loss- has recently lost 60 pounds-     Intentional wt loss due to  LAP-BAND/gastric sleeve procedure,   3)HTN-BP control has improved since weight loss, medications have to be adjusted downward, isosorbide/hydralazine has been discontinued especially given the patient's EF is 50 to 55%, discharge on Coreg to 12.5 mg twice daily   continue losartan 50 mg daily  4)HFpEF/nonischemic cardiomyopathy-patient has history of chronic systolic dysfunction CHF, however current echocardiogram reveals EF is now up to 50 to 55% with essentially normal systolic function however patient does have akinesis of the apex, medication changes as above #3,  Status post implantation of AICD: Interrogation today shows that it is functioning well , no  significant arrhythmias    5)Sleep apnea: Patient was diagnosed with sleep apnea but recommendation was not for CPAP but rather home oxygen. PCP to reevaluate and possible referral to pulmonologist   Code Status : Full   Disposition Plan  : home   Discharge Condition: stable Follow-up Information    Alroy Dust, L.Marlou Sa, MD.   Specialty:  Family Medicine Contact information: [address] [place] [address] [phone]          Consults obtained - stable  Diet and Activity recommendation:  As advised  Discharge Instructions     Discharge Instructions    (HEART FAILURE PATIENTS) Call MD:  Anytime you have any of the following symptoms: 1) 3 pound weight gain in 24 hours or 5 pounds in 1 week 2) shortness of breath, with or without a dry hacking cough 3) swelling in the hands, feet or stomach 4) if you have to sleep on extra pillows at night in order to breathe.    Complete by:  As directed    Call MD for:  difficulty breathing, headache or visual disturbances   Complete by:  As directed    Call MD for:  persistant dizziness or light-headedness   Complete by:  As directed    Call MD for:  persistant nausea and vomiting   Complete by:  As directed    Call MD for:  severe uncontrolled pain   Complete by:  As directed    Call MD for:  temperature >100.4   Complete by:  As directed    Diet - low sodium heart healthy   Complete by:  As directed    Discharge instructions   Complete by:  As directed    1)Please note that there have been several changes to your medication regimen, please take medications as prescribed upon discharge today, not as you previously did prior to admission to the hospital 2)Patient was diagnosed with sleep apnea but recommendation was not for CPAP but rather home oxygen at night--- patient has not been using Oxygen at night, please talk to your regular doctor as you may need home oxygen at night 3)Your regular doctor may consider referring you to a lung specialist/pulmonologist   Increase activity slowly   Complete by:  As directed         Discharge Medications     Allergies as of 01/09/2018      Reactions   Ace Inhibitors Cough   Iron Other (See Comments)   Gives patient migraine headaches when supplemented   Morphine And Related Itching, Rash      Medication List    STOP taking these medications   hydrALAZINE 25 MG tablet Commonly known as:  APRESOLINE   isosorbide dinitrate 20 MG tablet Commonly known as:  ISORDIL     TAKE these medications   amitriptyline 25 MG tablet Commonly known as:  ELAVIL Take 75 mg by mouth at bedtime.   aspirin 81 MG tablet Take 1 tablet (81 mg total) by mouth every evening.   atorvastatin 10 MG tablet Commonly known as:  LIPITOR TAKE 1 TABLET BY MOUTH ONCE DAILY AT 6 PM   carvedilol 12.5 MG tablet Commonly known as:  COREG TAKE 2 TABLETS BY MOUTH TWICE DAILY WITH  MEALS   cetirizine 5 MG tablet Commonly known as:  ZYRTEC Take 1 tablet (5 mg total) by mouth daily.   furosemide 40 MG tablet Commonly known as:  LASIX Take 0.5 tablets (20 mg total) by  mouth every morning. What changed:  how much to take   HYDROcodone-acetaminophen 7.5-325 MG tablet Commonly known as:  NORCO Take 1 tablet by mouth every 8 (eight) hours as needed for moderate pain or severe pain.   hydrOXYzine 25 MG tablet Commonly known as:  ATARAX/VISTARIL Take 25 mg by mouth every 6 (six) hours as needed.   losartan 50 MG tablet Commonly known as:  COZAAR Take 1 tablet (50 mg total) by mouth daily.   multivitamin tablet Take 1 tablet by mouth daily.   omeprazole 20 MG capsule Commonly known as:  PRILOSEC Take 1 capsule (20 mg total) by mouth daily.   potassium chloride SA 20 MEQ tablet Commonly known as:  K-DUR,KLOR-CON 2 TABS DAILY   triamcinolone 0.1 % cream : eucerin Crea Apply 1 application topically 2 (two) times daily.   triamcinolone cream 0.1 % Commonly known as:  KENALOG Apply 1 application topically 2 (two) times daily.       Major procedures and Radiology Reports - PLEASE review detailed and final reports for all details, in brief -     Dg Chest Port 1 View  Result Date: 01/07/2018 CLINICAL DATA:  Recurrent syncopal episodes. EXAM: PORTABLE CHEST 1 VIEW COMPARISON:  Radiographs 09/06/2016 and 01/26/2016. FINDINGS: 0811 hours. Left subclavian AICD lead appears unchanged. There is stable cardiomegaly and mild aortic tortuosity. The lungs are clear. There is no pleural effusion or pneumothorax. No acute osseous findings are seen. Glenohumeral degenerative changes and mild thoracic spondylosis are noted. IMPRESSION: Stable chest.  No acute cardiopulmonary process. Electronically Signed   By: Richardean Sale M.D.   On: 01/07/2018 08:54    Micro Results   No results found for this or any previous visit (from the past 240 hour(s)).     Today    Subjective    Azaylah Stailey today has no complaints, eating and drinking well, no dizziness no palpitations no shortness of breath no chest pains          Patient has been seen and examined prior to discharge   Objective   Blood pressure 133/81, pulse (!) 53, temperature 98.2 F (36.8 C), temperature source Oral, resp. rate 18, height _0  (1.6 m), weight 94.8 kg (209 lb), SpO2 96 %.   Intake/Output Summary (Last 24 hours) at 01/09/2018 1406 Last data filed at 01/09/2018 0711 Gross per 24 hour  Intake 5015.42 ml  Output 1200 ml  Net 3815.42 ml    Exam Gen:- Awake Alert,  In no apparent distress , obese HEENT:- [place].AT, No sclera icterus Neck-Supple Neck,No JVD,.  Lungs-  CTAB , good air movement CV- S1, S2 normal, CABG scar, AICD in situ Abd-  +ve B.Sounds, Abd Soft, No tenderness,    Extremity/Skin:- No  edema,   Good pulses Psych-affect is appropriate, oriented x3 Neuro-no new focal deficits, no tremors     Data Review   CBC w Diff:  Lab Results  Component Value Date   WBC 6.6 01/07/2018   HGB 9.9 (L) 01/07/2018   HCT 33.0 (L) 01/07/2018   PLT 153 01/07/2018   LYMPHOPCT 26 01/07/2018   MONOPCT 4 01/07/2018   EOSPCT 2 01/07/2018   BASOPCT 0 01/07/2018    CMP:  Lab Results  Component Value Date   NA 141 01/08/2018   K 4.4 01/08/2018   CL 106 01/08/2018   CO2 28 01/08/2018   BUN 20 01/08/2018   CREATININE 1.68 (H) 01/08/2018   CREATININE 1.09 (H) 09/26/2015   PROT  7.2 01/07/2018   ALBUMIN 3.0 (L) 01/07/2018   BILITOT 0.6 01/07/2018   ALKPHOS 91 01/07/2018   AST 11 (L) 01/07/2018   ALT 7 (L) 01/07/2018  .   Total Discharge time is about 33 minutes  Roxan Hockey M.D on 01/09/2018 at 2:06 PM  [facility]   Office  [phone]  Voice Recognition Viviann Spare dictation system was used to create this note, attempts have been made to correct errors. Please contact the author with questions and/or clarifications.

## 2018-01-10 ENCOUNTER — Other Ambulatory Visit: Payer: Self-pay | Admitting: Physician Assistant

## 2018-01-10 DIAGNOSIS — R21 Rash and other nonspecific skin eruption: Secondary | ICD-10-CM

## 2018-01-10 MED FILL — ISOSORBIDE DN 20 MG TABLET: 20 | 90 days supply | Qty: 180 | Fill #1

## 2018-01-12 ENCOUNTER — Telehealth: Payer: Self-pay | Admitting: Internal Medicine

## 2018-01-12 NOTE — Telephone Encounter (Signed)
Returned call to Pt.  Pt concerned, recent hospitalization for syncope and discontinuation of some blood pressure medications. Explained why medications were discontinued.  Advised Pt to monitor her BP/pulse every other day and keep record.  Advised to take to f/u appt with PCP.  PCP to review. Advised Pt if she had continued concerns about her BP after PCP appt she should call this nurse for review. Pt indicates understanding.  No further action at this time.

## 2018-01-12 NOTE — Telephone Encounter (Signed)
New Message   Pt c/o medication issue:  1. Name of Medication: isosorbide dinitrate (ISORDIL) 20 MG tablet  And hydrALAZINE (APRESOLINE) 25 MG tablet [[id number]]  2. How are you currently taking this medication (dosage and times per day)? n/a  3. Are you having a reaction (difficulty breathing--STAT)? no  4. What is your medication issue? Pt states the medication is too strong and she passed out and went to the er, they told her to stop taking it and the pt is wanting to know an alternative. Please call

## 2018-01-17 MED FILL — LOSARTAN POTASSIUM 50 MG TA: 50 | 30 days supply | Qty: 30 | Fill #0

## 2018-01-17 MED FILL — FUROSEMIDE 40 MG TAB: 40 | 60 days supply | Qty: 30 | Fill #0

## 2018-01-18 ENCOUNTER — Ambulatory Visit (INDEPENDENT_AMBULATORY_CARE_PROVIDER_SITE_OTHER): Payer: 59 | Admitting: Physician Assistant

## 2018-01-18 ENCOUNTER — Encounter: Payer: Self-pay | Admitting: Physician Assistant

## 2018-01-18 ENCOUNTER — Other Ambulatory Visit: Payer: Self-pay

## 2018-01-18 VITALS — BP 106/80 | HR 78 | Temp 98.4°F | Resp 16 | Ht 63.0 in | Wt 206.0 lb

## 2018-01-18 DIAGNOSIS — L309 Dermatitis, unspecified: Secondary | ICD-10-CM

## 2018-01-18 DIAGNOSIS — Z1159 Encounter for screening for other viral diseases: Secondary | ICD-10-CM

## 2018-01-18 DIAGNOSIS — Z23 Encounter for immunization: Secondary | ICD-10-CM

## 2018-01-18 DIAGNOSIS — Z1231 Encounter for screening mammogram for malignant neoplasm of breast: Secondary | ICD-10-CM

## 2018-01-18 MED ORDER — METHYLPREDNISOLONE ACETATE 80 MG/ML IJ SUSP
80.0000 mg | Freq: Once | INTRAMUSCULAR | Status: AC
  Administered 2018-01-18: 80 mg via INTRAMUSCULAR

## 2018-01-18 MED ORDER — TRIAMCINOLONE ACETONIDE 0.5 % EX OINT: 1.0000 "application " | TOPICAL_OINTMENT | Freq: Two times a day (BID) | CUTANEOUS | 0 refills | Status: AC

## 2018-01-18 MED FILL — TRIAMCINOLONE 0.5% OINTMENT: 0.5 | 15 days supply | Qty: 30 | Fill #0

## 2018-01-18 NOTE — Progress Notes (Signed)
Patient ID: Lori Beard, female    DOB: 03-31-1951, 67 y.o.   MRN: [id number]  PCP: Asencion Gowda.August Saucer, MD  Chief Complaint  Patient presents with  . Rash    started on leg, to feet, to hip to arm. now on her back and left side of ear , very itchy     Subjective:   Presents for evaluation of very itchy rash.  Started on the outer aspect of the RIGHT leg, just below the knee. Initially thought it may have ben a spider bite. No pain, only itchy. As the initial lesions resolved, new ones appeared on the Top of the RIGHT foot. The same resolution and appearance of new lesions continued: Lateral RIGHT thigh, RIGHT arm, now on the LEFT back and neck.  Has tried hand sanitizer with relief. Tried triamcinolone 0.1% from her visit 04/2017 without relief. Hydroxyzine 25 mg is on her medication list, but she reports that she doesn't take it. Reports that she was contacted and advised not to take it, and was referred to a kidney specialist. That person told her there was nothing wrong with her kidneys.  Totally different from the rash for which she was seen here 04/2017.  No recent travel, new medications, yard work, now detergent, soap, lotions, pets, etc. No personal history of diabetes.  Requests "everything" she needs regarding outstanding health maintenance. Reports that her PCP does not provide these. She has had a colonoscopy, but doesn't recall when or where, but believes that her PCP has that on record.  Health Maintenance Due  Topic Date Due  . Hepatitis C Screening  June 30, 1951  . TETANUS/TDAP  07/04/1969  . MAMMOGRAM  07/04/2000  . COLONOSCOPY  07/04/2000  . PNA vac Low Risk Adult (2 of 2 - PPSV23) 06/04/2017     Review of Systems As above.    Patient Active Problem List   Diagnosis Date Noted  . Weight loss 01/07/2018  . Morbid obesity (HCC)   . Hypertension   . GERD (gastroesophageal reflux disease)   . Chronic systolic CHF (congestive heart failure) (HCC)    . S/P implantation of automatic cardioverter/defibrillator (AICD)   . History of MI (myocardial infarction) 08/06/2013  . NICM (nonischemic cardiomyopathy) (HCC) 08/03/2013  . Hypokalemia 08/02/2013  . HTN (hypertension) 08/01/2013  . Anemia 08/01/2013  . Thrombocytopenia (HCC) 08/01/2013  . Lapband APS + hiatus hernia repair March 2014 11/23/2012  . Arthritis-bilateral hip replacements 11/10/2012  . Sleep apnea 02/15/2012  . Obesity 01/22/2012     Prior to Admission medications   Medication Sig Start Date End Date Taking? Authorizing Provider  amitriptyline (ELAVIL) 25 MG tablet Take 75 mg by mouth at bedtime. 04/12/12  Yes Dunn, Raymon Mutton, PA-C  aspirin 81 MG tablet Take 1 tablet (81 mg total) by mouth every evening. 10/06/13  Yes Nahser, Deloris Ping, MD  atorvastatin (LIPITOR) 10 MG tablet TAKE 1 TABLET BY MOUTH ONCE DAILY AT 6 PM 03/31/17  Yes Nahser, Deloris Ping, MD  carvedilol (COREG) 12.5 MG tablet TAKE 2 TABLETS BY MOUTH TWICE DAILY WITH MEALS 09/13/17  Yes Nahser, Deloris Ping, MD  cetirizine (ZYRTEC) 5 MG tablet Take 1 tablet (5 mg total) by mouth daily. 05/04/17  Yes Trena Platt D, PA  furosemide (LASIX) 40 MG tablet Take 0.5 tablets (20 mg total) by mouth every morning. 01/09/18  Yes Emokpae, Courage, MD  HYDROcodone-acetaminophen (NORCO) 7.5-325 MG tablet Take 1 tablet by mouth every 8 (eight) hours as needed for moderate  pain or severe pain.  01/07/16  Yes [provider]  losartan (COZAAR) 50 MG tablet Take 1 tablet (50 mg total) by mouth daily. 01/09/18  Yes Shon Hale, MD  Multiple Vitamin (MULTIVITAMIN) tablet Take 1 tablet by mouth daily.   Yes [provider]  omeprazole (PRILOSEC) 20 MG capsule Take 1 capsule (20 mg total) by mouth daily. 09/21/17  Yes Marinus Maw, MD  potassium chloride SA (K-DUR,KLOR-CON) 20 MEQ tablet 2 TABS DAILY 05/14/17  Yes Bhagat, Bhavinkumar, PA  Triamcinolone Acetonide (TRIAMCINOLONE 0.1 % CREAM : EUCERIN) CREA Apply 1 application  topically 2 (two) times daily. 10/22/15  Yes Sam, Serena Y, PA-C  triamcinolone cream (KENALOG) 0.1 % Apply 1 application topically 2 (two) times daily. 05/04/17  Yes Trena Platt D, PA     Allergies  Allergen Reactions  . Ace Inhibitors Cough  . Iron Other (See Comments)    Gives patient migraine headaches when supplemented  . Morphine And Related Itching and Rash       Objective:  Physical Exam  Constitutional: She is oriented to person, place, and time. She appears well-developed and well-nourished. She is active and cooperative. No distress.  BP 106/80   Pulse 78   Temp 98.4 F (36.9 C)   Resp 16   Ht 5\' 3"  (1.6 m)   Wt 206 lb (93.4 kg)   SpO2 95%   BMI 36.49 kg/m    Eyes: Conjunctivae are normal.  Pulmonary/Chest: Effort normal.  Neurological: She is alert and oriented to person, place, and time.  Skin: Skin is warm and dry. Rash noted. Rash is urticarial.  Psychiatric: She has a normal mood and affect. Her speech is normal and behavior is normal.             Assessment & Plan:   1. Dermatitis Continue cetirizine. Add ranitidine. Change triamcinolone from 0.1% to 0.5%. Skin hygiene reviewed. - methylPREDNISolone acetate (DEPO-MEDROL) injection 80 mg - triamcinolone ointment (KENALOG) 0.5 %; Apply 1 application topically 2 (two) times daily.  Dispense: 30 g; Refill: 0  2. Encounter for screening mammogram for breast cancer - MM Digital Screening; Future  3. Need for hepatitis C screening test - Hepatitis C antibody  4. Need for Tdap vaccination - Tdap vaccine greater than or equal to 7yo IM  5. Need for pneumococcal vaccination - Pneumococcal polysaccharide vaccine 23-valent greater than or equal to 2yo subcutaneous/IM    Return if symptoms worsen or fail to improve.   Fernande Bras, PA-C [facility]

## 2018-01-18 NOTE — Patient Instructions (Addendum)
1. CONTINUE the Zyrtec (cetirizine) 2. ADD Zantac (ranitidine) 150 mg twice daily 3. Use the stronger triamcinolone ointment, a very thin layer to the itchy spots only. 4. Good skin hygiene can prevent a recurrence: Drink at least 64 ounces of water daily. Consider a humidifier for the room where you sleep. Bathe once daily. Avoid using HOT water, as it dries skin.  Avoid deodorant soaps (Dial is the worst!) and stick with gentle cleansers (I like Cetaphil Liquid Cleanser). After bathing, dry off completely, then apply a thick emollient cream (I like Cetaphil Moisturizing Cream). Apply the cream twice daily, or more!     We recommend that you schedule a mammogram for breast cancer screening. Typically, you do not need a referral to do this. Please contact a local imaging center to schedule your mammogram.  [facility] - [phone]  *ask for the Radiology Department [facility]) - [phone] or [phone]  [facility] - [phone] [facility] - [phone] [facility] - [phone]  *ask for the Radiology Department [facility] - [phone]  *ask for the Radiology Department [facility] - [phone]  *ask for the Mammography Department [facility]  - [phone]  Ask Dr. [name] about your colonoscopy, and when you need the next one.   IF you received an x-ray today, you will receive an invoice from [facility]. Please contact [facility] at [phone] with questions or concerns regarding your invoice.   IF you received labwork today, you will receive an invoice from [facility]. Please contact [facility] at [phone] with questions or concerns regarding your invoice.   Our billing staff will not be able to assist you with questions regarding bills from these companies.  You will be contacted with the lab results as soon as they are  available. The fastest way to get your results is to activate your My Chart account. Instructions are located on the last page of this paperwork. If you have not heard from [place] regarding the results in 2 weeks, please contact this office.

## 2018-01-19 LAB — HEPATITIS C ANTIBODY: Hep C Virus Ab: <0.1 s/co ratio (ref 0.0–0.9)

## 2018-01-21 ENCOUNTER — Other Ambulatory Visit: Payer: Self-pay

## 2018-01-21 ENCOUNTER — Encounter: Payer: Self-pay | Admitting: Radiology

## 2018-01-21 ENCOUNTER — Observation Stay (HOSPITAL_COMMUNITY)
Admission: EM | Admit: 2018-01-21 | Discharge: 2018-01-23 | Disposition: A | Discharge status: Home / Self Care | Payer: 59 | Attending: Family Medicine | Admitting: Family Medicine

## 2018-01-21 ENCOUNTER — Encounter (HOSPITAL_COMMUNITY): Payer: Self-pay | Admitting: Emergency Medicine

## 2018-01-21 ENCOUNTER — Emergency Department (HOSPITAL_COMMUNITY): Payer: 59

## 2018-01-21 DIAGNOSIS — K5730 Diverticulosis of large intestine without perforation or abscess without bleeding: Secondary | ICD-10-CM | POA: Diagnosis not present

## 2018-01-21 DIAGNOSIS — T447X5A Adverse effect of beta-adrenoreceptor antagonists, initial encounter: Secondary | ICD-10-CM | POA: Diagnosis not present

## 2018-01-21 DIAGNOSIS — Z800 Family history of malignant neoplasm of digestive organs: Secondary | ICD-10-CM | POA: Insufficient documentation

## 2018-01-21 DIAGNOSIS — E876 Hypokalemia: Secondary | ICD-10-CM | POA: Diagnosis not present

## 2018-01-21 DIAGNOSIS — I5022 Chronic systolic (congestive) heart failure: Secondary | ICD-10-CM | POA: Insufficient documentation

## 2018-01-21 DIAGNOSIS — K5900 Constipation, unspecified: Secondary | ICD-10-CM | POA: Diagnosis not present

## 2018-01-21 DIAGNOSIS — R194 Change in bowel habit: Secondary | ICD-10-CM | POA: Diagnosis not present

## 2018-01-21 DIAGNOSIS — I252 Old myocardial infarction: Secondary | ICD-10-CM | POA: Insufficient documentation

## 2018-01-21 DIAGNOSIS — K219 Gastro-esophageal reflux disease without esophagitis: Secondary | ICD-10-CM | POA: Diagnosis not present

## 2018-01-21 DIAGNOSIS — Z9049 Acquired absence of other specified parts of digestive tract: Secondary | ICD-10-CM | POA: Diagnosis not present

## 2018-01-21 DIAGNOSIS — Z90710 Acquired absence of both cervix and uterus: Secondary | ICD-10-CM | POA: Diagnosis not present

## 2018-01-21 DIAGNOSIS — R001 Bradycardia, unspecified: Secondary | ICD-10-CM | POA: Diagnosis not present

## 2018-01-21 DIAGNOSIS — Z79899 Other long term (current) drug therapy: Secondary | ICD-10-CM | POA: Insufficient documentation

## 2018-01-21 DIAGNOSIS — L299 Pruritus, unspecified: Secondary | ICD-10-CM | POA: Diagnosis not present

## 2018-01-21 DIAGNOSIS — Z95810 Presence of automatic (implantable) cardiac defibrillator: Secondary | ICD-10-CM | POA: Diagnosis not present

## 2018-01-21 DIAGNOSIS — I428 Other cardiomyopathies: Secondary | ICD-10-CM | POA: Diagnosis not present

## 2018-01-21 DIAGNOSIS — N179 Acute kidney failure, unspecified: Principal | ICD-10-CM | POA: Insufficient documentation

## 2018-01-21 DIAGNOSIS — Z888 Allergy status to other drugs, medicaments and biological substances status: Secondary | ICD-10-CM | POA: Diagnosis not present

## 2018-01-21 DIAGNOSIS — M1990 Unspecified osteoarthritis, unspecified site: Secondary | ICD-10-CM | POA: Insufficient documentation

## 2018-01-21 DIAGNOSIS — I110 Hypertensive heart disease with heart failure: Secondary | ICD-10-CM | POA: Diagnosis not present

## 2018-01-21 DIAGNOSIS — G4730 Sleep apnea, unspecified: Secondary | ICD-10-CM | POA: Diagnosis not present

## 2018-01-21 DIAGNOSIS — Z885 Allergy status to narcotic agent status: Secondary | ICD-10-CM | POA: Diagnosis not present

## 2018-01-21 DIAGNOSIS — I472 Ventricular tachycardia: Secondary | ICD-10-CM | POA: Insufficient documentation

## 2018-01-21 DIAGNOSIS — I723 Aneurysm of iliac artery: Secondary | ICD-10-CM | POA: Diagnosis not present

## 2018-01-21 DIAGNOSIS — D6489 Other specified anemias: Secondary | ICD-10-CM | POA: Insufficient documentation

## 2018-01-21 DIAGNOSIS — Z841 Family history of disorders of kidney and ureter: Secondary | ICD-10-CM | POA: Insufficient documentation

## 2018-01-21 DIAGNOSIS — I771 Stricture of artery: Secondary | ICD-10-CM

## 2018-01-21 DIAGNOSIS — Z96643 Presence of artificial hip joint, bilateral: Secondary | ICD-10-CM | POA: Insufficient documentation

## 2018-01-21 DIAGNOSIS — Z7982 Long term (current) use of aspirin: Secondary | ICD-10-CM | POA: Insufficient documentation

## 2018-01-21 DIAGNOSIS — Z9884 Bariatric surgery status: Secondary | ICD-10-CM | POA: Insufficient documentation

## 2018-01-21 DIAGNOSIS — Z823 Family history of stroke: Secondary | ICD-10-CM | POA: Insufficient documentation

## 2018-01-21 DIAGNOSIS — Z8249 Family history of ischemic heart disease and other diseases of the circulatory system: Secondary | ICD-10-CM | POA: Insufficient documentation

## 2018-01-21 DIAGNOSIS — Z803 Family history of malignant neoplasm of breast: Secondary | ICD-10-CM | POA: Insufficient documentation

## 2018-01-21 HISTORY — DX: Aneurysm of iliac artery: I72.3

## 2018-01-21 LAB — URINALYSIS, ROUTINE W REFLEX MICROSCOPIC
Bilirubin Urine: NEGATIVE
Glucose, UA: NEGATIVE mg/dL
Hgb urine dipstick: NEGATIVE
Ketones, ur: NEGATIVE mg/dL
Nitrite: NEGATIVE
PROTEIN: NEGATIVE mg/dL
Specific Gravity, Urine: 1.01 (ref 1.005–1.030)
pH: 6 (ref 5.0–8.0)

## 2018-01-21 LAB — COMPREHENSIVE METABOLIC PANEL
ALT: 9 U/L — ABNORMAL LOW (ref 14–54)
ANION GAP: 6 (ref 5–15)
AST: 12 U/L — AB (ref 15–41)
Albumin: 3 g/dL — ABNORMAL LOW (ref 3.5–5.0)
Alkaline Phosphatase: 89 U/L (ref 38–126)
BILIRUBIN TOTAL: 0.5 mg/dL (ref 0.3–1.2)
BUN: 23 mg/dL — AB (ref 6–20)
CHLORIDE: 105 mmol/L (ref 101–111)
CO2: 28 mmol/L (ref 22–32)
Calcium: 8.9 mg/dL (ref 8.9–10.3)
Creatinine, Ser: 2.12 mg/dL — ABNORMAL HIGH (ref 0.44–1.00)
GFR, EST AFRICAN AMERICAN: 27 mL/min — AB (ref >60)
GFR, EST NON AFRICAN AMERICAN: 23 mL/min — AB (ref >60)
Glucose, Bld: 93 mg/dL (ref 65–99)
POTASSIUM: 4.5 mmol/L (ref 3.5–5.1)
Sodium: 139 mmol/L (ref 135–145)
TOTAL PROTEIN: 8 g/dL (ref 6.5–8.1)

## 2018-01-21 LAB — CBC
HEMATOCRIT: 33.5 % — AB (ref 36.0–46.0)
HEMOGLOBIN: 9.8 g/dL — AB (ref 12.0–15.0)
MCH: 25.6 pg — ABNORMAL LOW (ref 26.0–34.0)
MCHC: 29.3 g/dL — ABNORMAL LOW (ref 30.0–36.0)
MCV: 87.5 fL (ref 78.0–100.0)
Platelets: 212 10*3/uL (ref 150–400)
RBC: 3.83 MIL/uL — AB (ref 3.87–5.11)
RDW: 14 % (ref 11.5–15.5)
WBC: 6.7 10*3/uL (ref 4.0–10.5)

## 2018-01-21 LAB — LIPASE, BLOOD: Lipase: 21 U/L (ref 11–51)

## 2018-01-21 NOTE — ED Triage Notes (Signed)
Reports rash x 1 week.  States, "right now it is on my foot but in a few minutes it will move to my arm."  Also reports constipation x 1 month with abd distention.  Denies pain.  States she has not had a bowel movement at all in the last month.

## 2018-01-21 NOTE — ED Provider Notes (Signed)
Patient placed in Quick Look pathway, seen and evaluated   Chief Complaint: constipation and rash  HPI:   Pt is a 67 y.o. female with a PMHx of arthritis, CHF, GERD, HTN, NICM and CAD/MI s/p AICD placement, and other medical conditions, presenting today with 2 complaints.  Initially she states that her complaint is constipation for the last month, she states that she has not had any bowel movements at all in the last month which is abnormal.  Later on when asked whether there was anything else that brought her in, she states "yeah, this rash".  She states that the rash is currently on her right foot and L calf, it is pruritic and she states that if she scratches it it becomes a bump.  She was seen at [facility] urgent care 3 days ago and given 80 mg IM Medrol and a prescription for triamcinolone cream and Vistaril which has not provided any relief.  She states that she wanted to be evaluated for her rash and thought that her constipation may have something to do with her rash, which is why she came into the emergency room today.  She has tried Senokot with no relief of her constipation.  She takes Norco occasionally when she has pain.  She denies any fevers, abdominal pain, nausea, vomiting, diarrhea, obstipation, dysuria, hematuria, or any other complaints at this time.  ROS: +rash, +constipation. No  fevers, abdominal pain, nausea, vomiting, diarrhea, obstipation, dysuria, or hematuria  Physical Exam:  BP 121/73 (BP Location: Left Arm)   Pulse 71   Temp 98.8 F (37.1 C) (Oral)   Resp 16   SpO2 96%    Gen: No distress  Neuro: Awake and Alert  Skin: Warm, small area of erythema to R foot close to the 1st MTP joint area but no swelling or lesions here; small erythematous urticarial lesion to L anterior calf/lower leg. No evidence of cellulitis or secondary infection.     Focused Exam: Abd Soft, NTND, +BS throughout, no r/g/r, neg murphy's, neg mcburney's, no CVA TTP    Initiation of care has begun.  The patient has been counseled on the process, plan, and necessity for staying for the completion/evaluation, and the remainder of the medical screening examination     [address] [place], [place] 01/21/18 2040    Nira Conn, MD 01/21/18 2238

## 2018-01-22 ENCOUNTER — Encounter (HOSPITAL_COMMUNITY): Payer: Self-pay | Admitting: Family Medicine

## 2018-01-22 ENCOUNTER — Emergency Department (HOSPITAL_COMMUNITY): Payer: 59

## 2018-01-22 ENCOUNTER — Other Ambulatory Visit: Payer: Self-pay

## 2018-01-22 DIAGNOSIS — Z95810 Presence of automatic (implantable) cardiac defibrillator: Secondary | ICD-10-CM | POA: Diagnosis not present

## 2018-01-22 DIAGNOSIS — R001 Bradycardia, unspecified: Secondary | ICD-10-CM | POA: Diagnosis not present

## 2018-01-22 DIAGNOSIS — I771 Stricture of artery: Secondary | ICD-10-CM

## 2018-01-22 DIAGNOSIS — N179 Acute kidney failure, unspecified: Secondary | ICD-10-CM | POA: Diagnosis not present

## 2018-01-22 DIAGNOSIS — N183 Chronic kidney disease, stage 3 (moderate): Secondary | ICD-10-CM | POA: Diagnosis not present

## 2018-01-22 DIAGNOSIS — K5900 Constipation, unspecified: Secondary | ICD-10-CM

## 2018-01-22 DIAGNOSIS — K219 Gastro-esophageal reflux disease without esophagitis: Secondary | ICD-10-CM | POA: Diagnosis not present

## 2018-01-22 DIAGNOSIS — M1990 Unspecified osteoarthritis, unspecified site: Secondary | ICD-10-CM | POA: Diagnosis not present

## 2018-01-22 DIAGNOSIS — I252 Old myocardial infarction: Secondary | ICD-10-CM | POA: Diagnosis not present

## 2018-01-22 DIAGNOSIS — D6489 Other specified anemias: Secondary | ICD-10-CM | POA: Diagnosis not present

## 2018-01-22 DIAGNOSIS — I723 Aneurysm of iliac artery: Secondary | ICD-10-CM | POA: Diagnosis not present

## 2018-01-22 DIAGNOSIS — K5730 Diverticulosis of large intestine without perforation or abscess without bleeding: Secondary | ICD-10-CM | POA: Diagnosis not present

## 2018-01-22 MED ORDER — PANTOPRAZOLE SODIUM 40 MG PO TBEC
40.0000 mg | DELAYED_RELEASE_TABLET | Freq: Every day | ORAL | Status: DC
  Administered 2018-01-23: 40 mg via ORAL
  Filled 2018-01-22: qty 1

## 2018-01-22 MED ORDER — POLYETHYLENE GLYCOL 3350 17 G PO PACK
17.0000 g | PACK | Freq: Two times a day (BID) | ORAL | Status: DC
  Administered 2018-01-22 – 2018-01-23 (×2): 17 g via ORAL
  Filled 2018-01-22 (×2): qty 1

## 2018-01-22 MED ORDER — AMITRIPTYLINE HCL 50 MG PO TABS
75.0000 mg | ORAL_TABLET | Freq: Every day | ORAL | Status: DC
  Administered 2018-01-22: 75 mg via ORAL
  Filled 2018-01-22: qty 1

## 2018-01-22 MED ORDER — CARVEDILOL 12.5 MG PO TABS
12.5000 mg | ORAL_TABLET | Freq: Two times a day (BID) | ORAL | Status: DC
  Filled 2018-01-22: qty 1

## 2018-01-22 MED ORDER — IOPAMIDOL (ISOVUE-300) INJECTION 61%
INTRAVENOUS | Status: AC
  Filled 2018-01-22: qty 30

## 2018-01-22 MED ORDER — ACETAMINOPHEN 325 MG PO TABS: 650.0000 mg | ORAL_TABLET | Freq: Four times a day (QID) | ORAL | Status: DC | PRN

## 2018-01-22 MED ORDER — IOPAMIDOL (ISOVUE-300) INJECTION 61%: 30.0000 mL | Freq: Once | INTRAVENOUS | Status: DC | PRN

## 2018-01-22 MED ORDER — ATORVASTATIN CALCIUM 10 MG PO TABS: 10.0000 mg | ORAL_TABLET | Freq: Every day | ORAL | Status: DC

## 2018-01-22 MED ORDER — SODIUM CHLORIDE 0.9 % IV BOLUS
1000.0000 mL | Freq: Once | INTRAVENOUS | Status: AC
  Administered 2018-01-22: 1000 mL via INTRAVENOUS

## 2018-01-22 MED ORDER — FLEET ENEMA 7-19 GM/118ML RE ENEM
1.0000 | ENEMA | Freq: Once | RECTAL | Status: DC | PRN
  Filled 2018-01-22: qty 1

## 2018-01-22 MED ORDER — TRIAMCINOLONE 0.1 % CREAM:EUCERIN CREAM 1:1
1.0000 "application " | TOPICAL_CREAM | Freq: Two times a day (BID) | CUTANEOUS | Status: DC
  Filled 2018-01-22: qty 1

## 2018-01-22 MED ORDER — HYDROCERIN EX CREA
TOPICAL_CREAM | Freq: Two times a day (BID) | CUTANEOUS | Status: DC
  Administered 2018-01-23: 11:00:00 via TOPICAL
  Filled 2018-01-22: qty 113

## 2018-01-22 MED ORDER — ASPIRIN EC 81 MG PO TBEC
81.0000 mg | DELAYED_RELEASE_TABLET | Freq: Every evening | ORAL | Status: DC
Start: 2018-01-22 — End: 2018-01-23

## 2018-01-22 MED ORDER — ACETAMINOPHEN 650 MG RE SUPP: 650.0000 mg | Freq: Four times a day (QID) | RECTAL | Status: DC | PRN

## 2018-01-22 MED ORDER — LORATADINE 10 MG PO TABS
10.0000 mg | ORAL_TABLET | Freq: Every day | ORAL | Status: DC
  Administered 2018-01-23: 10 mg via ORAL
  Filled 2018-01-22: qty 1

## 2018-01-22 MED ORDER — SENNA 8.6 MG PO TABS
1.0000 | ORAL_TABLET | Freq: Two times a day (BID) | ORAL | Status: DC
  Administered 2018-01-22 – 2018-01-23 (×2): 8.6 mg via ORAL
  Filled 2018-01-22 (×2): qty 1

## 2018-01-22 MED ORDER — SODIUM CHLORIDE 0.9 % IV SOLN
INTRAVENOUS | Status: DC
  Administered 2018-01-22: 21:00:00 via INTRAVENOUS

## 2018-01-22 MED ORDER — TRIAMCINOLONE ACETONIDE 0.1 % EX CREA
TOPICAL_CREAM | Freq: Two times a day (BID) | CUTANEOUS | Status: DC
  Administered 2018-01-23: 11:00:00 via TOPICAL
  Filled 2018-01-22: qty 15

## 2018-01-22 NOTE — H&P (Signed)
History and Physical  DALLY OSHEL UUE:[id number] DOB: November 05, 1950 DOA: 01/21/2018  PCP: Alroy Dust, L.Marlou Sa, MD   Chief Complaint: Rash  HPI:  67 year old woman PMH ventricular tachycardia, status post implantation AICD, cardiac arrest secondary to polymorphic VT 2014, presented to the emergency department with 2 complaints: Rash on right foot as well as constipation.  Work-up revealed acute kidney injury, large stool burden.   Patient reports she has had a rash on her foot for several days, is quite pruritic and she came for further evaluation.  No specific aggravating or alleviating factors. Symptomatology is vague.  She reports she had a bowel movement this morning but the EDP notes she reported constipation.  Imaging did reveal large stool burden.  She has not been eating or drinking very well.  ED Course: Treated with IV fluids  Review of Systems:  Negative for fever, visual changes, sore throat, rash, new muscle aches, chest pain, SOB, dysuria, bleeding, n/v/abdominal pain.  Past Medical History:  Diagnosis Date  . Arthritis    ALL OVER  . Cardiac arrest ([place]) 08/01/2013   Polymorphic VT  . Chronic systolic CHF (congestive heart failure) ([place])   . GERD (gastroesophageal reflux disease)   . Hypertension   . Morbid obesity ([place])    a. s/p lapband surgery 11/2012.  [name] Myocardial infarction ([place]) 08/06/13  . NICM (nonischemic cardiomyopathy) ([place])    Echocardiogram (08/01/13): Mild LVH, global HK, EF 40-45%, Gr 1 DD, MAC, mild RVE, mildly reduced RVSF, PASP 31-35.  LHC (08/02/13):  Normal coronary arteries, EF 35%, LVEDP 17.  . Pneumonia 08/01/2013  . Renal failure, acute ([place]) 08/01/2013  . S/P implantation of automatic cardioverter/defibrillator (AICD)    07/2013 Lovena Le)  . Sleep apnea 02/15/2012   SLEEP STUDY IN EPIC - MILD OSA-CPAP NOT RECOMMENDED-HOME OXYGEN SUGGESTED BECAUSE OF OXYGEN DESATS ON ROOM AIR.  [name] Ventricular tachycardia (HCC)    a. R on T PVC during admission  for pneumonia/NSTEMI => monomorphic VT=>VF=>defib; s/p ICD    Past Surgical History:  Procedure Laterality Date  . ABDOMINAL HYSTERECTOMY    . BREATH TEK H PYLORI  02/15/2012   Procedure: BREATH TEK H PYLORI;  Surgeon: Pedro Earls, MD;  Location: [facility];  Service: General;  Laterality: N/A;  745  . CARDIAC CATHETERIZATION  08/02/2013   Normal coronary arteries, EF 35%   . CHOLECYSTECTOMY    . IMPLANTABLE CARDIOVERTER DEFIBRILLATOR IMPLANT N/A 08/04/2013   Procedure: IMPLANTABLE CARDIOVERTER DEFIBRILLATOR IMPLANT;  Surgeon: Evans Lance, MD;  Location: [facility] CATH LAB;  Service: Cardiovascular;  Laterality: N/A;  . JOINT REPLACEMENT     BILATERAL TOTAL HIP REPLACEMENTS  . KNEE ARTHROSCOPY    . LAPAROSCOPIC GASTRIC BANDING    . LEFT HEART CATHETERIZATION WITH CORONARY ANGIOGRAM N/A 08/02/2013   Procedure: LEFT HEART CATHETERIZATION WITH CORONARY ANGIOGRAM;  Surgeon: Wellington Hampshire, MD;  Location: [facility] CATH LAB;  Service: Cardiovascular;  Laterality: N/A;  . MESH APPLIED TO LAP PORT N/A 11/22/2012   Procedure: MESH APPLIED TO LAP PORT;  Surgeon: Pedro Earls, MD;  Location: [facility]S;  Service: General;  Laterality: N/A;  . PACEMAKER INSERTION   08/04/2013   St. [name] single chamber ICD     reports that she has never smoked. She has never used smokeless tobacco. She reports that she does not drink alcohol or use drugs.   Allergies  Allergen Reactions  . Ace Inhibitors Cough  . Iron Other (See Comments)    Gives patient migraine headaches when  supplemented  . Morphine And Related Itching and Rash    Family History  Problem Relation Age of Onset  . Kidney disease Mother   . Heart disease Father   . Hypertension Father   . Colon cancer Father   . Stroke Paternal Grandmother   . Hypertension Paternal Grandmother   . Breast cancer Sister   . Heart attack Neg Hx      Prior to Admission medications   Medication Sig Start Date End Date Taking? Authorizing Provider    amitriptyline (ELAVIL) 25 MG tablet Take 75 mg by mouth at bedtime. 04/12/12  Yes Dunn, Areta Haber, PA-C  aspirin 81 MG tablet Take 1 tablet (81 mg total) by mouth every evening. 10/06/13  Yes Nahser, Wonda Cheng, MD  atorvastatin (LIPITOR) 10 MG tablet TAKE 1 TABLET BY MOUTH ONCE DAILY AT 6 PM 03/31/17  Yes Nahser, Wonda Cheng, MD  carvedilol (COREG) 12.5 MG tablet TAKE 2 TABLETS BY MOUTH TWICE DAILY WITH MEALS 09/13/17  Yes Nahser, Wonda Cheng, MD  cetirizine (ZYRTEC) 5 MG tablet Take 1 tablet (5 mg total) by mouth daily. Patient taking differently: Take 5 mg by mouth as needed.  05/04/17  Yes Ivar Drape D, PA  furosemide (LASIX) 40 MG tablet Take 0.5 tablets (20 mg total) by mouth every morning. 01/09/18  Yes Emokpae, Courage, MD  HYDROcodone-acetaminophen (NORCO) 7.5-325 MG tablet Take 1 tablet by mouth every 8 (eight) hours as needed for moderate pain or severe pain.  01/07/16  Yes [provider]  losartan (COZAAR) 50 MG tablet Take 1 tablet (50 mg total) by mouth daily. 01/09/18  Yes Emokpae, Courage, MD  omeprazole (PRILOSEC) 20 MG capsule Take 1 capsule (20 mg total) by mouth daily. 09/21/17  Yes Evans Lance, MD  potassium chloride SA (K-DUR,KLOR-CON) 20 MEQ tablet 2 TABS DAILY 05/14/17  Yes Bhagat, Bhavinkumar, PA  triamcinolone ointment (KENALOG) 0.5 % Apply 1 application topically 2 (two) times daily. 01/18/18  Yes Harrison Mons, PA-C    Physical Exam: Vitals:   01/22/18 1500 01/22/18 1700  BP: (!) 172/102 127/80  Pulse: (!) 45 (!) 50  Resp: 15 11  Temp:    SpO2: 97% 95%    Constitutional:   Appears calm and comfortable Eyes:  pupils and irises appear normal Normal lids  ENMT:  grossly normal hearing  Lips appear normal Neck:  neck appears normal, no masses no thyromegaly Respiratory:  CTA bilaterally, no w/r/r.  Respiratory effort normal Cardiovascular:  RRR, no m/r/g No LE extremity edema   Abdomen:  Soft, nontender, nondistended, positive bowel sounds  No hernia  noted  Musculoskeletal:  RUE, LUE, RLE, LLE   strength and tone normal, no atrophy, no abnormal movements Skin:  No visible rash on back or right foot palpation of skin: no induration or nodules Psychiatric:  Mental status Mood, affect appropriate  I have personally reviewed following labs and imaging studies  Labs:   Creatinine 2.12, BUN 23, remainder CMP unremarkable  Hemoglobin stable 9.8, remainder CBC unremarkable.  Imaging studies:   CT abdomen pelvis shows large stool burden without acute features.  Incidental finding of common iliac artery aneurysm noted.  Chest x-ray no acute disease  Principal Problem:   AKI (acute kidney injury) ([place]) Active Problems:   Right iliac artery stenosis (HCC)   Constipation   Assessment/Plan AKI.  Likely secondary to poor oral intake by history. --IV fluids, repeat BMP in a.m., if fails to improve consider renal ultrasound  Right common iliac  artery aneurysm. --Outpatient referral to vascular surgery.  Chronic normocytic anemia, appears stable. --Follow-up as an outpatient.  Sinus bradycardia secondary to beta-blocker.  Asymptomatic.  Follow clinically.  Constipation.  Bowel regimen.  Severity of Illness: The appropriate patient status for this patient is OBSERVATION. Observation status is judged to be reasonable and necessary in order to provide the required intensity of service to ensure the patient's safety. The patient's presenting symptoms, physical exam findings, and initial radiographic and laboratory data in the context of their medical condition is felt to place them at decreased risk for further clinical deterioration. Furthermore, it is anticipated that the patient will be medically stable for discharge from the hospital within 2 midnights of admission. The following factors support the patient status of observation.   DVT prophylaxis: SCDs Code Status: full Family Communication: none Consults called: none    Time  spent: 40 minutes  Murray Hodgkins, MD  [facility] Direct contact: [phone] --Via amion app OR  --[email or web address]; password [email or web address]  7PM-7AM contact night coverage as above  01/22/2018, 5:34 PM

## 2018-01-22 NOTE — Progress Notes (Signed)
New Admission Note: Pt transferred to 14M 17 from [facility] ED  Arrival Method: via tretcher Mental Orientation: alert and oriented x 4 Telemetry: initiated Assessment: Completed Skin: Intact IV: R AC sl Pain: denies Tubes: None Safety Measures: Safety Fall Prevention Plan has been discussed  Admission: to be completed 5 [place] Orientation: Patient has been orientated to the room, unit and staff.   Family:   Orders to be reviewed and implemented. Will continue to monitor the patient. Call light has been placed within reach and bed alarm has been activated.   Burley Saver, BSN, RN-BC Phone: [phone]

## 2018-01-22 NOTE — ED Provider Notes (Signed)
[facility] EMERGENCY DEPARTMENT Provider Note   CSN: [id number] Arrival date & time: 01/21/18  1949     History   Chief Complaint Chief Complaint  Patient presents with  . Rash  . Constipation    HPI Lori Beard is a 67 y.o. female   Past medical history of arthritis, GERD, hypertension, history of gastric sleeve, ventricular tachycardia with AICD implant who presents for evaluation of 2 complaints.  Patient reports that she has had constipation that has been ongoing for the last month.  Patient states that over the last month, she has not had any bowel movements.  Patient reports that she took Senokot yesterday prior to coming to the ED.  Patient that reports overnight while waiting in the emergency department, she had a small bowel movement that she describes as small, hard balls.  Patient states that there is no blood in the stool.  Patient reports that she has not been evaluated for her constipation.  Patient does report that she intermittently will take Norco for pain.  Patient reports that she has been eating and drinking appropriately but sometimes has decreased appetite.  She denies any nausea, vomiting.  Patient denies any unintentional weight loss.  She has not had any fevers or chills or night sweats.  Patient also comes for evaluation of rash that has been ongoing for a week.  Patient reports that it is pruritic.  She states that initially started on her right foot and states that it is not painful.  She was evaluated at [facility] urgent care 3 days ago and given IM steroids and a prescription for steroid cream.  Patient reports she has used the cream with no improvement.  Patient reports that she will be pruritic in her back and her arms and states that the redness will sometimes spread to her upper extremities.  Patient denies any fevers, chest pain, difficulty breathing, abdominal pain, vomiting.  The history is provided by the patient.    Past Medical  History:  Diagnosis Date  . Arthritis    ALL OVER  . Cardiac arrest ([place]) 08/01/2013   Polymorphic VT  . Chronic systolic CHF (congestive heart failure) ([place])   . GERD (gastroesophageal reflux disease)   . Hypertension   . Morbid obesity ([place])    a. s/p lapband surgery 11/2012.  [name] Myocardial infarction ([place]) 08/06/13  . NICM (nonischemic cardiomyopathy) ([place])    Echocardiogram (08/01/13): Mild LVH, global HK, EF 40-45%, Gr 1 DD, MAC, mild RVE, mildly reduced RVSF, PASP 31-35.  LHC (08/02/13):  Normal coronary arteries, EF 35%, LVEDP 17.  . Pneumonia 08/01/2013  . Renal failure, acute ([place]) 08/01/2013  . S/P implantation of automatic cardioverter/defibrillator (AICD)    07/2013 Lovena Le)  . Sleep apnea 02/15/2012   SLEEP STUDY IN EPIC - MILD OSA-CPAP NOT RECOMMENDED-HOME OXYGEN SUGGESTED BECAUSE OF OXYGEN DESATS ON ROOM AIR.  [name] Ventricular tachycardia (H[facility])    a. R on T PVC during admission for pneumonia/NSTEMI => monomorphic VT=>VF=>defib; s/p ICD    Patient Active Problem List   Diagnosis Date Noted  . AKI (acute kidney injury) ([place]) 01/22/2018  . Right iliac artery stenosis ([place]) 01/22/2018  . Constipation 01/22/2018  . Weight loss 01/07/2018  . Morbid obesity ([place])   . Hypertension   . GERD (gastroesophageal reflux disease)   . Chronic systolic CHF (congestive heart failure) ([place])   . S/P implantation of automatic cardioverter/defibrillator (AICD)   . History of MI (myocardial infarction) 08/06/2013  .  NICM (nonischemic cardiomyopathy) ([place]) 08/03/2013  . Hypokalemia 08/02/2013  . HTN (hypertension) 08/01/2013  . Anemia 08/01/2013  . Thrombocytopenia ([place]) 08/01/2013  . Lapband APS + hiatus hernia repair March 2014 11/23/2012  . Arthritis-bilateral hip replacements 11/10/2012  . Sleep apnea 02/15/2012  . Obesity 01/22/2012    Past Surgical History:  Procedure Laterality Date  . ABDOMINAL HYSTERECTOMY    . BREATH TEK H PYLORI  02/15/2012   Procedure: BREATH TEK H  PYLORI;  Surgeon: Pedro Earls, MD;  Location: [facility];  Service: General;  Laterality: N/A;  745  . CARDIAC CATHETERIZATION  08/02/2013   Normal coronary arteries, EF 35%   . CHOLECYSTECTOMY    . IMPLANTABLE CARDIOVERTER DEFIBRILLATOR IMPLANT N/A 08/04/2013   Procedure: IMPLANTABLE CARDIOVERTER DEFIBRILLATOR IMPLANT;  Surgeon: Evans Lance, MD;  Location: [facility] CATH LAB;  Service: Cardiovascular;  Laterality: N/A;  . JOINT REPLACEMENT     BILATERAL TOTAL HIP REPLACEMENTS  . KNEE ARTHROSCOPY    . LAPAROSCOPIC GASTRIC BANDING    . LEFT HEART CATHETERIZATION WITH CORONARY ANGIOGRAM N/A 08/02/2013   Procedure: LEFT HEART CATHETERIZATION WITH CORONARY ANGIOGRAM;  Surgeon: Wellington Hampshire, MD;  Location: [facility] CATH LAB;  Service: Cardiovascular;  Laterality: N/A;  . MESH APPLIED TO LAP PORT N/A 11/22/2012   Procedure: MESH APPLIED TO LAP PORT;  Surgeon: Pedro Earls, MD;  Location: [facility] ORS;  Service: General;  Laterality: N/A;  . PACEMAKER INSERTION   08/04/2013   St. [name] single chamber ICD     OB History   None      Home Medications    Prior to Admission medications   Medication Sig Start Date End Date Taking? Authorizing Provider  amitriptyline (ELAVIL) 25 MG tablet Take 75 mg by mouth at bedtime. 04/12/12  Yes Dunn, Areta Haber, PA-C  aspirin 81 MG tablet Take 1 tablet (81 mg total) by mouth every evening. 10/06/13  Yes Nahser, Wonda Cheng, MD  atorvastatin (LIPITOR) 10 MG tablet TAKE 1 TABLET BY MOUTH ONCE DAILY AT 6 PM 03/31/17  Yes Nahser, Wonda Cheng, MD  carvedilol (COREG) 12.5 MG tablet TAKE 2 TABLETS BY MOUTH TWICE DAILY WITH MEALS 09/13/17  Yes Nahser, Wonda Cheng, MD  cetirizine (ZYRTEC) 5 MG tablet Take 1 tablet (5 mg total) by mouth daily. Patient taking differently: Take 5 mg by mouth as needed.  05/04/17  Yes Ivar Drape D, PA  furosemide (LASIX) 40 MG tablet Take 0.5 tablets (20 mg total) by mouth every morning. 01/09/18  Yes Emokpae, Courage, MD  HYDROcodone-acetaminophen  (NORCO) 7.5-325 MG tablet Take 1 tablet by mouth every 8 (eight) hours as needed for moderate pain or severe pain.  01/07/16  Yes [provider]  losartan (COZAAR) 50 MG tablet Take 1 tablet (50 mg total) by mouth daily. 01/09/18  Yes Emokpae, Courage, MD  omeprazole (PRILOSEC) 20 MG capsule Take 1 capsule (20 mg total) by mouth daily. 09/21/17  Yes Evans Lance, MD  potassium chloride SA (K-DUR,KLOR-CON) 20 MEQ tablet 2 TABS DAILY 05/14/17  Yes Bhagat, Bhavinkumar, PA  triamcinolone ointment (KENALOG) 0.5 % Apply 1 application topically 2 (two) times daily. 01/18/18  Yes Harrison Mons, PA-C    Family History Family History  Problem Relation Age of Onset  . Kidney disease Mother   . Heart disease Father   . Hypertension Father   . Colon cancer Father   . Stroke Paternal Grandmother   . Hypertension Paternal Grandmother   . Breast cancer Sister   .  Heart attack Neg Hx     Social History Social History   Tobacco Use  . Smoking status: Never Smoker  . Smokeless tobacco: Never Used  Substance Use Topics  . Alcohol use: No  . Drug use: No     Allergies   Ace inhibitors; Iron; and Morphine and related   Review of Systems Review of Systems  Constitutional: Negative for diaphoresis, fever and unexpected weight change.  Respiratory: Negative for cough and shortness of breath.   Cardiovascular: Negative for chest pain.  Gastrointestinal: Positive for constipation. Negative for abdominal pain, blood in stool, nausea and vomiting.  Genitourinary: Negative for dysuria and hematuria.  Skin: Positive for rash.  Neurological: Negative for headaches.  All other systems reviewed and are negative.    Physical Exam Updated Vital Signs BP 120/80 (BP Location: Left Arm)   Pulse 70   Temp 98.4 F (36.9 C) (Oral)   Resp 16   SpO2 92%   Physical Exam  Constitutional: She is oriented to person, place, and time. She appears well-developed and well-nourished.  HENT:  Head:  Normocephalic and atraumatic.  Mouth/Throat: Oropharynx is clear and moist and mucous membranes are normal.  Eyes: Pupils are equal, round, and reactive to light. Conjunctivae, EOM and lids are normal.  Neck: Full passive range of motion without pain.  Cardiovascular: Normal rate, regular rhythm, normal heart sounds and normal pulses. Exam reveals no gallop and no friction rub.  No murmur heard. Pulmonary/Chest: Effort normal and breath sounds normal.  Abdominal: Soft. Normal appearance and bowel sounds are normal. There is no tenderness. There is no rigidity and no guarding.  Abdomen is soft, nondistended, nontender.  Positive bowel sounds noted.  Musculoskeletal: Normal range of motion.  Neurological: She is alert and oriented to person, place, and time.  Skin: Skin is warm and dry. Capillary refill takes less than 2 seconds.  Small area of erythema noted to the base of the metacarpal of the right first toe.  Psychiatric: She has a normal mood and affect. Her speech is normal.  Nursing note and vitals reviewed.    ED Treatments / Results  Labs (all labs ordered are listed, but only abnormal results are displayed) Labs Reviewed  COMPREHENSIVE METABOLIC PANEL - Abnormal; Notable for the following components:      Result Value   BUN 23 (*)    Creatinine, Ser 2.12 (*)    Albumin 3.0 (*)    AST 12 (*)    ALT 9 (*)    GFR calc non Af Amer 23 (*)    GFR calc Af Amer 27 (*)    All other components within normal limits  CBC - Abnormal; Notable for the following components:   RBC 3.83 (*)    Hemoglobin 9.8 (*)    HCT 33.5 (*)    MCH 25.6 (*)    MCHC 29.3 (*)    All other components within normal limits  URINALYSIS, ROUTINE W REFLEX MICROSCOPIC - Abnormal; Notable for the following components:   Leukocytes, UA MODERATE (*)    Bacteria, UA RARE (*)    All other components within normal limits  LIPASE, BLOOD    EKG None  Radiology Ct Abdomen Pelvis Wo Contrast  Result Date:  01/22/2018 CLINICAL DATA:  Constipation.  Abdominal pain. EXAM: CT ABDOMEN AND PELVIS WITHOUT CONTRAST TECHNIQUE: Multidetector CT imaging of the abdomen and pelvis was performed following the standard protocol without IV contrast. COMPARISON:  Plain films 01/21/2018 FINDINGS: Lower chest: Linear  atelectasis or scarring in the lung bases. Heart is borderline in size. AICD wire noted in the right ventricle. No effusions. Hepatobiliary: Prior cholecystectomy. 19 mm low-density lesion in the right hepatic dome, likely cyst. Pancreas: No focal abnormality or ductal dilatation. Spleen: No focal abnormality.  Normal size. Adrenals/Urinary Tract: Mild fullness of the right renal collecting system and ureter. No visible stones in the kidneys or ureters bilaterally. Adrenal glands and urinary bladder unremarkable. Stomach/Bowel: Sigmoid diverticulosis. Large stool burden in the colon, particularly transverse colon and descending colon. No evidence of bowel obstruction. Prior gastric band placement.  sec as strict band device in place. Vascular/Lymphatic: Appears to be aneurysmal dilatation of the right common iliac artery measuring 2.6 cm. No evidence of aortic aneurysm. No adenopathy. Reproductive: Prior hysterectomy.  No adnexal masses. Other: No free fluid or free air. Musculoskeletal: No acute bony abnormality. IMPRESSION: Large stool burden throughout the colon compatible constipation. Sigmoid diverticulosis. No active diverticulitis. Gastric band device in place. Mild fullness of the right renal collecting system and ureter. This may be due to right common iliac artery aneurysm which measures approximately 2.6 cm. No visible ureteral stones. Prior cholecystectomy and hysterectomy. Electronically Signed   By: Rolm Baptise M.D.   On: 01/22/2018 16:25   Dg Abd Acute W/chest  Result Date: 01/21/2018 CLINICAL DATA:  Lack of bowel movements for 1 month. EXAM: DG ABDOMEN ACUTE W/ 1V CHEST COMPARISON:  Chest radiograph  performed 01/07/2018, and pelvic radiograph performed 06/27/2009 FINDINGS: The lungs are well-aerated. Mild left basilar airspace opacity likely reflects atelectasis. There is no evidence of pleural effusion or pneumothorax. The cardiomediastinal silhouette is within normal limits. An AICD is noted at the left chest wall, with a single lead ending at the right ventricle. The visualized bowel gas pattern is unremarkable. Scattered stool and air are seen within the colon; there is no evidence of small bowel dilatation to suggest obstruction. No free intra-abdominal air is identified on the provided upright view. A gastric band is grossly unremarkable in appearance. No acute osseous abnormalities are seen; the sacroiliac joints are unremarkable in appearance. Bilateral hip arthroplasties are grossly unremarkable in appearance, though incompletely imaged. IMPRESSION: 1. Unremarkable bowel gas pattern; no free intra-abdominal air seen. Moderate amount of stool noted in the colon, without significant radiographic evidence for constipation. 2. Mild left basilar airspace opacity likely reflects atelectasis. Lungs otherwise grossly clear. Electronically Signed   By: Garald Balding M.D.   On: 01/21/2018 21:53    Procedures Procedures (including critical care time)  Medications Ordered in ED Medications  iopamidol (ISOVUE-300) 61 % injection 30 mL (has no administration in time range)  amitriptyline (ELAVIL) tablet 75 mg (75 mg Oral Given 01/22/18 2223)  aspirin EC tablet 81 mg (has no administration in time range)  atorvastatin (LIPITOR) tablet 10 mg (has no administration in time range)  carvedilol (COREG) tablet 12.5 mg (has no administration in time range)  loratadine (CLARITIN) tablet 10 mg (has no administration in time range)  pantoprazole (PROTONIX) EC tablet 40 mg (has no administration in time range)  triamcinolone cream (KENALOG) 0.1 % (has no administration in time range)    And  hydrocerin  (EUCERIN) cream (has no administration in time range)  acetaminophen (TYLENOL) tablet 650 mg (has no administration in time range)    Or  acetaminophen (TYLENOL) suppository 650 mg (has no administration in time range)  0.9 %  sodium chloride infusion ( Intravenous New Bag/Given 01/22/18 2044)  senna (SENOKOT) tablet 8.6 mg (8.6  mg Oral Given 01/22/18 2043)  polyethylene glycol (MIRALAX / GLYCOLAX) packet 17 g (17 g Oral Given 01/22/18 2043)  sodium phosphate (FLEET) 7-19 GM/118ML enema 1 enema (has no administration in time range)  sodium chloride 0.9 % bolus 1,000 mL (0 mLs Intravenous Stopped 01/22/18 1509)     Initial Impression / Assessment and Plan / ED Course  I have reviewed the triage vital signs and the nursing notes.  Pertinent labs & imaging results that were available during my care of the patient were reviewed by me and considered in my medical decision making (see chart for details).  Clinical Course as of Jan 22 2305  Sat Jan 22, 2433  841 67 year old female complaining of itchy rash that is traveling around her body for about a week.  She went to her PCP where they put her on some Atarax and triamcinolone with no relief.  She also states she is been constipated which she does not have a history of.  She is well-appearing on exam abdomen is soft.  She does have a small red patch about a centimeter on her foot with a small bump but no obvious signs of any burrowing.  She states there is no one at home with similar symptoms.  Her labs do demonstrate a new AKI and an equivocal urine.  She is pending a CT abdomen pelvis and then will decide on disposition.   [MB]    Clinical Course User Index [MB] Hayden Rasmussen, MD    67 year old female who presents for evaluation of constipation x1 month and rash x1 week.  No fevers, weight loss, vomiting, abdominal pain, chest pain. Patient is afebrile, non-toxic appearing, sitting comfortably on examination table. Vital signs reviewed and  stable.  On exam, abdomen is soft, nondistended, nontender.  Initial labs and imaging ordered at triage.  CBC shows no leukocytosis.  Hemoglobin is 9.8 and hematocrit is 33.5.  Most recent CBC shows a hemoglobin at 9.9 review of records shows consistent with previous.  MP shows BUN of 23 and creatinine of 2.12.  Most recent CMP 2 weeks ago showed a creatinine of 1.28 and a BUN of 10.  Patient has moderate sites, pyuria.  X-ray shows normal bowel gas pattern.  No free intra-abdominal air.  There is moderate amount of stool noted.  Given that she has this long-standing history of constipation and does have anemia, will plan for CT abdomen pelvis evaluation to ensure that there is no mass that is causing this.   CT abdomen pelvis shows large stool burden throughout the colon.  There is no evidence of diverticulitis.  Her gastric band is in place.  There is mention of fullness of the right renal collecting system and ureter.  She also has mention of a right common iliac artery aneurysm that measures approximately 2.6 cm.  No other acute findings.  Given AKI, will plan for admission.  Discussed with hospitalist.  Agrees for admission.  Final Clinical Impressions(s) / ED Diagnoses   Final diagnoses:  Constipation, unspecified constipation type  Acute kidney injury [facility])    ED Discharge Orders    None       Desma Mcgregor 01/22/18 2306    Hayden Rasmussen, MD 01/24/18 1136

## 2018-01-23 ENCOUNTER — Encounter (HOSPITAL_COMMUNITY): Payer: Self-pay | Admitting: Family Medicine

## 2018-01-23 DIAGNOSIS — Z95810 Presence of automatic (implantable) cardiac defibrillator: Secondary | ICD-10-CM | POA: Diagnosis not present

## 2018-01-23 DIAGNOSIS — N183 Chronic kidney disease, stage 3 (moderate): Secondary | ICD-10-CM

## 2018-01-23 DIAGNOSIS — I723 Aneurysm of iliac artery: Secondary | ICD-10-CM | POA: Diagnosis not present

## 2018-01-23 DIAGNOSIS — I771 Stricture of artery: Secondary | ICD-10-CM | POA: Diagnosis not present

## 2018-01-23 DIAGNOSIS — R001 Bradycardia, unspecified: Secondary | ICD-10-CM | POA: Diagnosis not present

## 2018-01-23 DIAGNOSIS — D6489 Other specified anemias: Secondary | ICD-10-CM | POA: Diagnosis not present

## 2018-01-23 DIAGNOSIS — K219 Gastro-esophageal reflux disease without esophagitis: Secondary | ICD-10-CM | POA: Diagnosis not present

## 2018-01-23 DIAGNOSIS — M1990 Unspecified osteoarthritis, unspecified site: Secondary | ICD-10-CM | POA: Diagnosis not present

## 2018-01-23 DIAGNOSIS — N179 Acute kidney failure, unspecified: Secondary | ICD-10-CM | POA: Diagnosis not present

## 2018-01-23 DIAGNOSIS — K5900 Constipation, unspecified: Secondary | ICD-10-CM | POA: Diagnosis not present

## 2018-01-23 DIAGNOSIS — I252 Old myocardial infarction: Secondary | ICD-10-CM | POA: Diagnosis not present

## 2018-01-23 LAB — BASIC METABOLIC PANEL
ANION GAP: 6 (ref 5–15)
BUN: 15 mg/dL (ref 6–20)
CHLORIDE: 107 mmol/L (ref 101–111)
CO2: 26 mmol/L (ref 22–32)
Calcium: 8.2 mg/dL — ABNORMAL LOW (ref 8.9–10.3)
Creatinine, Ser: 1.27 mg/dL — ABNORMAL HIGH (ref 0.44–1.00)
GFR calc Af Amer: 49 mL/min — ABNORMAL LOW (ref >60)
GFR calc non Af Amer: 43 mL/min — ABNORMAL LOW (ref >60)
Glucose, Bld: 98 mg/dL (ref 65–99)
POTASSIUM: 3.9 mmol/L (ref 3.5–5.1)
SODIUM: 139 mmol/L (ref 135–145)

## 2018-01-23 MED ORDER — HYDROCERIN EX CREA: 1.0000 "application " | TOPICAL_CREAM | Freq: Two times a day (BID) | CUTANEOUS | 0 refills | Status: AC

## 2018-01-23 MED ORDER — POLYETHYLENE GLYCOL 3350 17 G PO PACK: 17.0000 g | PACK | Freq: Two times a day (BID) | ORAL | Status: DC | PRN

## 2018-01-23 NOTE — Discharge Summary (Signed)
Physician Discharge Summary  ODA PLACKE ZOX:[id number] DOB: [birth year] DOA: 01/21/2018  PCP: [name], L.August Saucer, MD  Admit date: 01/21/2018 Discharge date: 01/23/2018  Recommendations for Outpatient Follow-up:   AKI. Secondary to poor oral intake.  Possible CKD stage III. Only one BMP on file 2017, previous values from 2014. --Consider further evaluation as an outpatient.  Right common iliac artery aneurysm. --Recommend outpatient referral to vascular surgery.  Constipation. Continue bowel regimen on discharge.    Follow-up Information    [name], L.August Saucer, MD. Schedule an appointment as soon as possible for a visit in 1 week(s).   Specialty:  Family Medicine Contact information: [address] [place] [address] [phone]            Discharge Diagnoses:  1. AKI 2. Possible CKD stage III. 3. Right common iliac artery aneurysm. 4. Chronic normocytic anemia 5. Constipation  Discharge Condition: improved Disposition: home  Diet recommendation: regular  History of present illness:  67 year old woman PMH ventricular tachycardia, status post implantation AICD, cardiac arrest secondary to polymorphic VT 2014, presented to the emergency department with 2 complaints: Rash on right foot as well as constipation.  Work-up revealed acute kidney injury, large stool burden.   Hospital Course:  Patient was observed overnight, she had rapid clinical improvement with near resolution of AKI.  Expect spontaneous return to baseline as an outpatient.  Itching of the right foot improved with Eucerin cream.  No evidence of rash.  Hospitalization was uncomplicated.  Individual issues as below.  AKI. Secondary to poor oral intake. --Improved with IV fluids.  Expect return to baseline spontaneously.  Possible CKD stage III. Only one BMP on file 2017, previous values from 2014. --Follow-up as an outpatient.  Right common iliac artery aneurysm. --Asymptomatic.   Right lower extremity appears unremarkable.  Dorsalis pedis pulse 2+ on the right.  Outpatient referral to vascular surgery.  Chronic normocytic anemia, appears stable. --Follow-up as an outpatient.  Sinus bradycardia secondary to beta-blocker.  Asymptomatic.  Telemetry unremarkable.  Constipation.  Bowels have moved here.  Continue  bowel regimen on discharge.  Discussed with patient.  Today's assessment: S: Feels well.  Eating okay.  Has had a bowel movement.  No pain.  Has some itching of the right foot but Eucerin has helped. O: Vitals:  Vitals:   01/23/18 0449 01/23/18 0923  BP: 136/82 129/83  Pulse: (!) 59 62  Resp: 16 18  Temp: 98.5 F (36.9 C) 98.3 F (36.8 C)  SpO2: 96% 95%    Constitutional:  . Appears calm and comfortable.  Well-appearing. Respiratory:  . CTA bilaterally, no w/r/r.  . Respiratory effort normal.  Cardiovascular:  . RRR, no m/r/g . No LE extremity edema    Dorsalis pedis pulse right foot 2+. Abdomen:  . Soft, nontender, nondistended Skin:  . No rash right foot. Psychiatric:  . Mental status o Mood, affect appropriate  Creatinine 2.12 >> 0.27 Telemetry sinus rhythm  Discharge Instructions  Discharge Instructions    Activity as tolerated - No restrictions   Complete by:  As directed    Diet general   Complete by:  As directed    Discharge instructions   Complete by:  As directed    Call your physician or seek immediate medical attention for rash, generalized weakness, constipation or worsening of condition.     Allergies as of 01/23/2018      Reactions   Ace Inhibitors Cough   Iron Other (See Comments)   Gives  patient migraine headaches when supplemented   Morphine And Related Itching, Rash      Medication List    TAKE these medications   amitriptyline 25 MG tablet Commonly known as:  ELAVIL Take 75 mg by mouth at bedtime.   aspirin 81 MG tablet Take 1 tablet (81 mg total) by mouth every evening.   atorvastatin 10 MG  tablet Commonly known as:  LIPITOR TAKE 1 TABLET BY MOUTH ONCE DAILY AT 6 PM   carvedilol 12.5 MG tablet Commonly known as:  COREG TAKE 2 TABLETS BY MOUTH TWICE DAILY WITH MEALS   cetirizine 5 MG tablet Commonly known as:  ZYRTEC Take 1 tablet (5 mg total) by mouth daily. What changed:    when to take this  reasons to take this   furosemide 40 MG tablet Commonly known as:  LASIX Take 0.5 tablets (20 mg total) by mouth every morning.   hydrocerin Crea Apply 1 application topically 2 (two) times daily.   HYDROcodone-acetaminophen 7.5-325 MG tablet Commonly known as:  NORCO Take 1 tablet by mouth every 8 (eight) hours as needed for moderate pain or severe pain.   losartan 50 MG tablet Commonly known as:  COZAAR Take 1 tablet (50 mg total) by mouth daily.   omeprazole 20 MG capsule Commonly known as:  PRILOSEC Take 1 capsule (20 mg total) by mouth daily.   polyethylene glycol packet Commonly known as:  MIRALAX / GLYCOLAX Take 17 g by mouth 2 (two) times daily as needed (constipation. Goal to have bowel movement at least every other day).   potassium chloride SA 20 MEQ tablet Commonly known as:  K-DUR,KLOR-CON 2 TABS DAILY   triamcinolone ointment 0.5 % Commonly known as:  KENALOG Apply 1 application topically 2 (two) times daily.      Allergies  Allergen Reactions  . Ace Inhibitors Cough  . Iron Other (See Comments)    Gives patient migraine headaches when supplemented  . Morphine And Related Itching and Rash    The results of significant diagnostics from this hospitalization (including imaging, microbiology, ancillary and laboratory) are listed below for reference.    Significant Diagnostic Studies: Ct Abdomen Pelvis Wo Contrast  Result Date: 01/22/2018 CLINICAL DATA:  Constipation.  Abdominal pain. EXAM: CT ABDOMEN AND PELVIS WITHOUT CONTRAST TECHNIQUE: Multidetector CT imaging of the abdomen and pelvis was performed following the standard protocol without  IV contrast. COMPARISON:  Plain films 01/21/2018 FINDINGS: Lower chest: Linear atelectasis or scarring in the lung bases. Heart is borderline in size. AICD wire noted in the right ventricle. No effusions. Hepatobiliary: Prior cholecystectomy. 19 mm low-density lesion in the right hepatic dome, likely cyst. Pancreas: No focal abnormality or ductal dilatation. Spleen: No focal abnormality.  Normal size. Adrenals/Urinary Tract: Mild fullness of the right renal collecting system and ureter. No visible stones in the kidneys or ureters bilaterally. Adrenal glands and urinary bladder unremarkable. Stomach/Bowel: Sigmoid diverticulosis. Large stool burden in the colon, particularly transverse colon and descending colon. No evidence of bowel obstruction. Prior gastric band placement.  sec as strict band device in place. Vascular/Lymphatic: Appears to be aneurysmal dilatation of the right common iliac artery measuring 2.6 cm. No evidence of aortic aneurysm. No adenopathy. Reproductive: Prior hysterectomy.  No adnexal masses. Other: No free fluid or free air. Musculoskeletal: No acute bony abnormality. IMPRESSION: Large stool burden throughout the colon compatible constipation. Sigmoid diverticulosis. No active diverticulitis. Gastric band device in place. Mild fullness of the right renal collecting system and ureter. This  may be due to right common iliac artery aneurysm which measures approximately 2.6 cm. No visible ureteral stones. Prior cholecystectomy and hysterectomy. Electronically Signed   By: Charlett Nose M.D.   On: 01/22/2018 16:25   Dg Chest Port 1 View  Result Date: 01/07/2018 CLINICAL DATA:  Recurrent syncopal episodes. EXAM: PORTABLE CHEST 1 VIEW COMPARISON:  Radiographs 09/06/2016 and 01/26/2016. FINDINGS: 0811 hours. Left subclavian AICD lead appears unchanged. There is stable cardiomegaly and mild aortic tortuosity. The lungs are clear. There is no pleural effusion or pneumothorax. No acute osseous findings  are seen. Glenohumeral degenerative changes and mild thoracic spondylosis are noted. IMPRESSION: Stable chest.  No acute cardiopulmonary process. Electronically Signed   By: Carey Bullocks M.D.   On: 01/07/2018 08:54   Dg Abd Acute W/chest  Result Date: 01/21/2018 CLINICAL DATA:  Lack of bowel movements for 1 month. EXAM: DG ABDOMEN ACUTE W/ 1V CHEST COMPARISON:  Chest radiograph performed 01/07/2018, and pelvic radiograph performed 06/27/2009 FINDINGS: The lungs are well-aerated. Mild left basilar airspace opacity likely reflects atelectasis. There is no evidence of pleural effusion or pneumothorax. The cardiomediastinal silhouette is within normal limits. An AICD is noted at the left chest wall, with a single lead ending at the right ventricle. The visualized bowel gas pattern is unremarkable. Scattered stool and air are seen within the colon; there is no evidence of small bowel dilatation to suggest obstruction. No free intra-abdominal air is identified on the provided upright view. A gastric band is grossly unremarkable in appearance. No acute osseous abnormalities are seen; the sacroiliac joints are unremarkable in appearance. Bilateral hip arthroplasties are grossly unremarkable in appearance, though incompletely imaged. IMPRESSION: 1. Unremarkable bowel gas pattern; no free intra-abdominal air seen. Moderate amount of stool noted in the colon, without significant radiographic evidence for constipation. 2. Mild left basilar airspace opacity likely reflects atelectasis. Lungs otherwise grossly clear. Electronically Signed   By: Roanna Raider M.D.   On: 01/21/2018 21:53    Labs: Basic Metabolic Panel: Recent Labs  Lab 01/21/18 2033 01/23/18 0756  NA 139 139  K 4.5 3.9  CL 105 107  CO2 28 26  GLUCOSE 93 98  BUN 23* 15  CREATININE 2.12* 1.27*  CALCIUM 8.9 8.2*   Liver Function Tests: Recent Labs  Lab 01/21/18 2033  AST 12*  ALT 9*  ALKPHOS 89  BILITOT 0.5  PROT 8.0  ALBUMIN 3.0*    Recent Labs  Lab 01/21/18 2033  LIPASE 21   CBC: Recent Labs  Lab 01/21/18 2033  WBC 6.7  HGB 9.8*  HCT 33.5*  MCV 87.5  PLT 212    Principal Problem:   AKI (acute kidney injury) (HCC) Active Problems:   Right iliac artery stenosis (HCC)   Constipation   Time coordinating discharge: 25 minutes  Signed:  Brendia Sacks, MD [facility] 01/23/2018, 10:46 AM

## 2018-01-25 MED FILL — HYDROCERIN CREAM: 30 days supply | Qty: 454 | Fill #0

## 2018-02-09 DIAGNOSIS — I723 Aneurysm of iliac artery: Secondary | ICD-10-CM | POA: Diagnosis not present

## 2018-02-09 DIAGNOSIS — I10 Essential (primary) hypertension: Secondary | ICD-10-CM | POA: Diagnosis not present

## 2018-02-09 DIAGNOSIS — N183 Chronic kidney disease, stage 3 (moderate): Secondary | ICD-10-CM | POA: Diagnosis not present

## 2018-02-14 NOTE — Progress Notes (Signed)
Cardiology Office Note    Date:  02/15/2018   ID:  [name], DOB Nov 15, 1950, MRN [id number]  PCP:  Aurea Graff.Marlou Sa, MD  Cardiologist: Mertie Moores, MD EPS Dr. Lovena Le Chief Complaint  Patient presents with  . Hospitalization Follow-up    History of Present Illness:  Lori Beard is a 67 y.o. female with history of nonischemic cardiomyopathy, chronic systolic CHF, V. fib arrest status post ICD 2014 due to R on T PVC in setting of hypokalemia, normal coronary arteries in 2014 hypertension, EF had been 35%.  Patient was discharged from the hospital 01/09/2018 after an admission with presyncope.  ICD was interrogated and no events were noted.  Follow-up echo LVEF is now up to 50 to 55% with akinesis of the apex.  Patient had recently lost 60 pounds and had had some diarrhea.  They decreased her Coreg to 12.5 mg twice daily in the hospital, stopped her isosorbide and hydralazine but continued losartan.  Patient was also diagnosed with sleep apnea but recommendation was not for CPAP but rather home O2.  Patient was readmitted to the hospital with AK I secondary to poor oral intake. Crt 2.12 down to 1.27 at discharge.  She was treated with IV fluids.  Patient comes in today for post hospital follow-up.  She did not decrease her Lasix to 20 mg daily as instructed because she was afraid to since they stopped her hydralazine and isosorbide.  She is feeling well without any presyncope, chest pain, dyspnea or edema.  She does not follow a low-salt diet and eats out quite a bit.   Past Medical History:  Diagnosis Date  . Aneurysm of right iliac artery (HCC)   . Arthritis    ALL OVER  . Cardiac arrest ([place]) 08/01/2013   Polymorphic VT  . Chronic systolic CHF (congestive heart failure) ([place])   . GERD (gastroesophageal reflux disease)   . Hypertension   . Morbid obesity ([place])    a. s/p lapband surgery 11/2012.  [name] Myocardial infarction ([place]) 08/06/13  . NICM (nonischemic cardiomyopathy)  ([place])    Echocardiogram (08/01/13): Mild LVH, global HK, EF 40-45%, Gr 1 DD, MAC, mild RVE, mildly reduced RVSF, PASP 31-35.  LHC (08/02/13):  Normal coronary arteries, EF 35%, LVEDP 17.  . Pneumonia 08/01/2013  . Renal failure, acute ([place]) 08/01/2013  . S/P implantation of automatic cardioverter/defibrillator (AICD)    07/2013 Lovena Le)  . Sleep apnea 02/15/2012   SLEEP STUDY IN EPIC - MILD OSA-CPAP NOT RECOMMENDED-HOME OXYGEN SUGGESTED BECAUSE OF OXYGEN DESATS ON ROOM AIR.  [name] Ventricular tachycardia (HCC)    a. R on T PVC during admission for pneumonia/NSTEMI => monomorphic VT=>VF=>defib; s/p ICD    Past Surgical History:  Procedure Laterality Date  . ABDOMINAL HYSTERECTOMY    . BREATH TEK H PYLORI  02/15/2012   Procedure: BREATH TEK H PYLORI;  Surgeon: Pedro Earls, MD;  Location: [facility];  Service: General;  Laterality: N/A;  745  . CARDIAC CATHETERIZATION  08/02/2013   Normal coronary arteries, EF 35%   . CHOLECYSTECTOMY    . IMPLANTABLE CARDIOVERTER DEFIBRILLATOR IMPLANT N/A 08/04/2013   Procedure: IMPLANTABLE CARDIOVERTER DEFIBRILLATOR IMPLANT;  Surgeon: Evans Lance, MD;  Location: [facility] CATH LAB;  Service: Cardiovascular;  Laterality: N/A;  . JOINT REPLACEMENT     BILATERAL TOTAL HIP REPLACEMENTS  . KNEE ARTHROSCOPY    . LAPAROSCOPIC GASTRIC BANDING    . LEFT HEART CATHETERIZATION WITH CORONARY ANGIOGRAM N/A 08/02/2013   Procedure: LEFT  HEART CATHETERIZATION WITH CORONARY ANGIOGRAM;  Surgeon: Wellington Hampshire, MD;  Location: [facility] CATH LAB;  Service: Cardiovascular;  Laterality: N/A;  . MESH APPLIED TO LAP PORT N/A 11/22/2012   Procedure: MESH APPLIED TO LAP PORT;  Surgeon: Pedro Earls, MD;  Location: [facility] ORS;  Service: General;  Laterality: N/A;  . PACEMAKER INSERTION   08/04/2013   St. [name] single chamber ICD    Current Medications: Current Meds  Medication Sig  . amitriptyline (ELAVIL) 25 MG tablet Take 75 mg by mouth at bedtime.  [name] aspirin 81 MG tablet Take 1  tablet (81 mg total) by mouth every evening.  [name] atorvastatin (LIPITOR) 10 MG tablet TAKE 1 TABLET BY MOUTH ONCE DAILY AT 6 PM  . carvedilol (COREG) 12.5 MG tablet TAKE 2 TABLETS BY MOUTH TWICE DAILY WITH MEALS  . cetirizine (ZYRTEC) 5 MG tablet Take 1 tablet (5 mg total) by mouth daily. (Patient taking differently: Take 5 mg by mouth as needed. )  . furosemide (LASIX) 40 MG tablet Take 40 mg by mouth daily.  . hydrocerin (EUCERIN) CREA Apply 1 application topically 2 (two) times daily.  [name] HYDROcodone-acetaminophen (NORCO) 7.5-325 MG tablet Take 1 tablet by mouth every 8 (eight) hours as needed for moderate pain or severe pain.   [name] losartan (COZAAR) 50 MG tablet Take 1 tablet (50 mg total) by mouth daily.  [name] omeprazole (PRILOSEC) 20 MG capsule Take 1 capsule (20 mg total) by mouth daily.  . polyethylene glycol (MIRALAX / GLYCOLAX) packet Take 17 g by mouth 2 (two) times daily as needed (constipation. Goal to have bowel movement at least every other day).  . potassium chloride SA (K-DUR,KLOR-CON) 20 MEQ tablet Take 20 mEq by mouth 2 (two) times daily.  [name] triamcinolone ointment (KENALOG) 0.5 % Apply 1 application topically 2 (two) times daily.     Allergies:   Ace inhibitors; Iron; and Morphine and related   Social History   Socioeconomic History  . Marital status: Married    Spouse name: Not on file  . Number of children: Not on file  . Years of education: Not on file  . Highest education level: Not on file  Occupational History  . Not on file  Social Needs  . Financial resource strain: Not on file  . Food insecurity:    Worry: Not on file    Inability: Not on file  . Transportation needs:    Medical: Not on file    Non-medical: Not on file  Tobacco Use  . Smoking status: Never Smoker  . Smokeless tobacco: Never Used  Substance and Sexual Activity  . Alcohol use: No  . Drug use: No  . Sexual activity: Not Currently  Lifestyle  . Physical activity:    Days per week: Not on  file    Minutes per session: Not on file  . Stress: Not on file  Relationships  . Social connections:    Talks on phone: Not on file    Gets together: Not on file    Attends religious service: Not on file    Active member of club or organization: Not on file    Attends meetings of clubs or organizations: Not on file    Relationship status: Not on file  Other Topics Concern  . Not on file  Social History Narrative  . Not on file     Family History:  The patient's family history includes Breast cancer in her sister; Colon cancer in her  father; Heart disease in her father; Hypertension in her father and paternal grandmother; Kidney disease in her mother; Stroke in her paternal grandmother.   ROS:   Please see the history of present illness.    Review of Systems  Constitution: Negative.  HENT: Negative.   Eyes: Negative.   Cardiovascular: Negative.   Respiratory: Negative.   Hematologic/Lymphatic: Negative.   Musculoskeletal: Negative.  Negative for joint pain.  Gastrointestinal: Negative.   Genitourinary: Negative.   Neurological: Negative.    All other systems reviewed and are negative.   PHYSICAL EXAM:   VS:  BP 122/74   Pulse 77   Ht 5' 3.6" (1.615 m)   Wt 209 lb (94.8 kg)   SpO2 99%   BMI 36.33 kg/m   Physical Exam  GEN: Well nourished, well developed, in no acute distress  Neck: no JVD, carotid bruits, or masses Cardiac:RRR; no murmurs, rubs, or gallops  Respiratory:  clear to auscultation bilaterally, normal work of breathing GI: soft, nontender, nondistended, + BS Ext: without cyanosis, clubbing, or edema, Good distal pulses bilaterally Neuro:  Alert and Oriented x 3 Psych: euthymic mood, full affect  Wt Readings from Last 3 Encounters:  02/15/18 209 lb (94.8 kg)  01/18/18 206 lb (93.4 kg)  01/09/18 209 lb (94.8 kg)      Studies/Labs Reviewed:   EKG:  EKG is not ordered today.   Recent Labs: 01/21/2018: ALT 9; Hemoglobin 9.8; Platelets  212 01/23/2018: BUN 15; Creatinine, Ser 1.27; Potassium 3.9; Sodium 139   Lipid Panel    Component Value Date/Time   CHOL 118 (L) 09/26/2015 1558   TRIG 85 09/26/2015 1558   HDL 40 (L) 09/26/2015 1558   CHOLHDL 3.0 09/26/2015 1558   VLDL 17 09/26/2015 1558   LDLCALC 61 09/26/2015 1558    Additional studies/ records that were reviewed today include:  2D echo 5/4/2019Study Conclusions   - Left ventricle: LVEF is approximately 50% with apical   hypokinesis. The cavity size was normal. Wall thickness was   increased in a pattern of mild LVH. Systolic function was normal.   The estimated ejection fraction was in the range of 50% to 55%. - Pulmonary arteries: PA peak pressure: 38 mm Hg (S).       ASSESSMENT:    1. NICM (nonischemic cardiomyopathy) ([place])   2. Chronic systolic CHF (congestive heart failure) ([place])   3. S/P implantation of automatic cardioverter/defibrillator (AICD)   4. Essential hypertension   5. Sleep apnea, unspecified type   6. Morbid obesity ([place])      PLAN:  In order of problems listed above:  Nonischemic cardiomyopathy ejection fraction now 55% on recent echo.  Patient was having symptoms of dizziness and low blood pressures.  In the hospital isosorbide/hydralazine were stopped, carvedilol decreased to 12.5 mg twice daily and continued on losartan.  She was also supposed to lower her Lasix but was afraid to.  Will check be met today.  Advised her to reduce her Lasix to 20 mg once daily now that her ejection fraction is normal.  If she has any swelling or signs of heart failure she is to call us.  Chronic systolic CHF EF now improved 50 to 55% decrease Lasix to 20 mg once daily.  Follow-up with Dr. Acie Fredrickson in 3 months.  Status post ICD for ventricular tachycardia arrest in 2014 ICD without any events when checked 01/09/2018.  Follow-up with Dr. Lovena Le as scheduled.  Sleep apnea recommended home O2  Morbid obesity status  post lap band with intentional 60  pound weight loss which contributed to the need for less BP medication.  AKI secondary to weight loss, decrease oral intake and diuretics.  Recheck renal function today.    Medication Adjustments/Labs and Tests Ordered: Current medicines are reviewed at length with the patient today.  Concerns regarding medicines are outlined above.  Medication changes, Labs and Tests ordered today are listed in the Patient Instructions below. Patient Instructions  Medication Instructions:  Your physician recommends that you continue on your current medications as directed. Please refer to the Current Medication list given to you today.   Labwork: None ordered  Testing/Procedures: None ordered  Follow-Up: Your physician recommends that you schedule a follow-up appointment in:    Any Other Special Instructions Will Be Listed Below (If Applicable).     If you need a refill on your cardiac medications before your next appointment, please call your pharmacy.      Signed, Ermalinda Barrios, PA-C  02/15/2018 8:25 AM    [facility] [address], [place], [place]  [address] Phone: [phone]; Fax: [phone]

## 2018-02-15 ENCOUNTER — Encounter (INDEPENDENT_AMBULATORY_CARE_PROVIDER_SITE_OTHER): Payer: Self-pay

## 2018-02-15 ENCOUNTER — Ambulatory Visit (INDEPENDENT_AMBULATORY_CARE_PROVIDER_SITE_OTHER): Payer: Medicare Other | Admitting: Physician Assistant

## 2018-02-15 ENCOUNTER — Ambulatory Visit (INDEPENDENT_AMBULATORY_CARE_PROVIDER_SITE_OTHER): Payer: Medicare Other | Admitting: *Deleted

## 2018-02-15 ENCOUNTER — Encounter: Payer: Self-pay | Admitting: Physician Assistant

## 2018-02-15 ENCOUNTER — Telehealth: Payer: Self-pay | Admitting: Physician Assistant

## 2018-02-15 VITALS — BP 122/74 | HR 77 | Ht 63.6 in | Wt 209.0 lb

## 2018-02-15 DIAGNOSIS — I10 Essential (primary) hypertension: Secondary | ICD-10-CM

## 2018-02-15 DIAGNOSIS — I428 Other cardiomyopathies: Secondary | ICD-10-CM

## 2018-02-15 DIAGNOSIS — I5022 Chronic systolic (congestive) heart failure: Secondary | ICD-10-CM

## 2018-02-15 DIAGNOSIS — Z95810 Presence of automatic (implantable) cardiac defibrillator: Secondary | ICD-10-CM

## 2018-02-15 DIAGNOSIS — G4730 Sleep apnea, unspecified: Secondary | ICD-10-CM | POA: Diagnosis not present

## 2018-02-15 DIAGNOSIS — N179 Acute kidney failure, unspecified: Secondary | ICD-10-CM | POA: Diagnosis not present

## 2018-02-15 LAB — BASIC METABOLIC PANEL
BUN/Creatinine Ratio: 10 — ABNORMAL LOW (ref 12–28)
BUN: 15 mg/dL (ref 8–27)
CHLORIDE: 106 mmol/L (ref 96–106)
CO2: 25 mmol/L (ref 20–29)
Calcium: 8.7 mg/dL (ref 8.7–10.3)
Creatinine, Ser: 1.45 mg/dL — ABNORMAL HIGH (ref 0.57–1.00)
GFR calc Af Amer: 43 mL/min/{1.73_m2} — ABNORMAL LOW (ref >59)
GFR calc non Af Amer: 37 mL/min/{1.73_m2} — ABNORMAL LOW (ref >59)
GLUCOSE: 110 mg/dL — AB (ref 65–99)
POTASSIUM: 4.8 mmol/L (ref 3.5–5.2)
SODIUM: 144 mmol/L (ref 134–144)

## 2018-02-15 MED ORDER — FUROSEMIDE 20 MG PO TABS: 20.0000 mg | ORAL_TABLET | Freq: Every day | ORAL | 3 refills | Status: DC

## 2018-02-15 MED ORDER — POTASSIUM CHLORIDE CRYS ER 20 MEQ PO TBCR: 20.0000 meq | EXTENDED_RELEASE_TABLET | Freq: Every day | ORAL | 3 refills | Status: DC

## 2018-02-15 MED FILL — FUROSEMIDE 20 MG TABS: 20 | 90 days supply | Qty: 90 | Fill #0

## 2018-02-15 NOTE — Telephone Encounter (Signed)
Reviewed lab results with patient who states she will reduce her Lasix to 20 mg daily as advised at her office visit today She is scheduled for repeat bmet on 6/25 and has an appointment with Dr. Elease Hashimoto in 3 months. She thanked me for the call.

## 2018-02-15 NOTE — Patient Instructions (Addendum)
Medication Instructions:  Your physician has recommended you make the following change in your medication:  1.  REDUCE the Lasix to 20 mg taking 1 tablet daily 2.  REDUCE the Potassium to 20 meq taking 1 tablet daily  Labwork: TODAY:  BMET  Testing/Procedures: None ordered  Follow-Up: Your physician recommends that you schedule a follow-up appointment in: 3 MONTH WITH DR. Elease Hashimoto  Any Other Special Instructions Will Be Listed Below (If Applicable).  DASH Eating Plan DASH stands for "Dietary Approaches to Stop Hypertension." The DASH eating plan is a healthy eating plan that has been shown to reduce high blood pressure (hypertension). It may also reduce your risk for type 2 diabetes, heart disease, and stroke. The DASH eating plan may also help with weight loss. What are tips for following this plan? General guidelines  Avoid eating more than 2,000 mg (milligrams) of salt (sodium) a day. If you have hypertension, you may need to reduce your sodium intake to 1,500 mg a day.  Limit alcohol intake to no more than 1 drink a day for nonpregnant women and 2 drinks a day for men. One drink equals 12 oz of beer, 5 oz of wine, or 1 oz of hard liquor.  Work with your health care provider to maintain a healthy body weight or to lose weight. Ask what an ideal weight is for you.  Get at least 30 minutes of exercise that causes your heart to beat faster (aerobic exercise) most days of the week. Activities may include walking, swimming, or biking.  Work with your health care provider or diet and nutrition specialist (dietitian) to adjust your eating plan to your individual calorie needs. Reading food labels  Check food labels for the amount of sodium per serving. Choose foods with less than 5 percent of the Daily Value of sodium. Generally, foods with less than 300 mg of sodium per serving fit into this eating plan.  To find whole grains, look for the word "whole" as the first word in the  ingredient list. Shopping  Buy products labeled as "low-sodium" or "no salt added."  Buy fresh foods. Avoid canned foods and premade or frozen meals. Cooking  Avoid adding salt when cooking. Use salt-free seasonings or herbs instead of table salt or sea salt. Check with your health care provider or pharmacist before using salt substitutes.  Do not fry foods. Cook foods using healthy methods such as baking, boiling, grilling, and broiling instead.  Cook with heart-healthy oils, such as olive, canola, soybean, or sunflower oil. Meal planning   Eat a balanced diet that includes: ? 5 or more servings of fruits and vegetables each day. At each meal, try to fill half of your plate with fruits and vegetables. ? Up to 6-8 servings of whole grains each day. ? Less than 6 oz of lean meat, poultry, or fish each day. A 3-oz serving of meat is about the same size as a deck of cards. One egg equals 1 oz. ? 2 servings of low-fat dairy each day. ? A serving of nuts, seeds, or beans 5 times each week. ? Heart-healthy fats. Healthy fats called Omega-3 fatty acids are found in foods such as flaxseeds and coldwater fish, like sardines, salmon, and mackerel.  Limit how much you eat of the following: ? Canned or prepackaged foods. ? Food that is high in trans fat, such as fried foods. ? Food that is high in saturated fat, such as fatty meat. ? Sweets, desserts, sugary drinks, and  other foods with added sugar. ? Full-fat dairy products.  Do not salt foods before eating.  Try to eat at least 2 vegetarian meals each week.  Eat more home-cooked food and less restaurant, buffet, and fast food.  When eating at a restaurant, ask that your food be prepared with less salt or no salt, if possible. What foods are recommended? The items listed may not be a complete list. Talk with your dietitian about what dietary choices are best for you. Grains Whole-grain or whole-wheat bread. Whole-grain or whole-wheat  pasta. Brown rice. [place]ena Morrow. Bulgur. Whole-grain and low-sodium cereals. Pita bread. Low-fat, low-sodium crackers. Whole-wheat flour tortillas. Vegetables Fresh or frozen vegetables (raw, steamed, roasted, or grilled). Low-sodium or reduced-sodium tomato and vegetable juice. Low-sodium or reduced-sodium tomato sauce and tomato paste. Low-sodium or reduced-sodium canned vegetables. Fruits All fresh, dried, or frozen fruit. Canned fruit in natural juice (without added sugar). Meat and other protein foods Skinless chicken or [place]. Ground chicken or [place]. Pork with fat trimmed off. Fish and seafood. Egg whites. Dried beans, peas, or lentils. Unsalted nuts, nut butters, and seeds. Unsalted canned beans. Lean cuts of beef with fat trimmed off. Low-sodium, lean deli meat. Dairy Low-fat (1%) or fat-free (skim) milk. Fat-free, low-fat, or reduced-fat cheeses. Nonfat, low-sodium ricotta or cottage cheese. Low-fat or nonfat yogurt. Low-fat, low-sodium cheese. Fats and oils Soft margarine without trans fats. Vegetable oil. Low-fat, reduced-fat, or light mayonnaise and salad dressings (reduced-sodium). Canola, safflower, olive, soybean, and sunflower oils. Avocado. Seasoning and other foods Herbs. Spices. Seasoning mixes without salt. Unsalted popcorn and pretzels. Fat-free sweets. What foods are not recommended? The items listed may not be a complete list. Talk with your dietitian about what dietary choices are best for you. Grains Baked goods made with fat, such as croissants, muffins, or some breads. Dry pasta or rice meal packs. Vegetables Creamed or fried vegetables. Vegetables in a cheese sauce. Regular canned vegetables (not low-sodium or reduced-sodium). Regular canned tomato sauce and paste (not low-sodium or reduced-sodium). Regular tomato and vegetable juice (not low-sodium or reduced-sodium). Angie Fava. Olives. Fruits Canned fruit in a light or heavy syrup. Fried fruit. Fruit in cream or  butter sauce. Meat and other protein foods Fatty cuts of meat. Ribs. Fried meat. Berniece Salines. Sausage. Bologna and other processed lunch meats. Salami. Fatback. Hotdogs. Bratwurst. Salted nuts and seeds. Canned beans with added salt. Canned or smoked fish. Whole eggs or egg yolks. Chicken or [place] with skin. Dairy Whole or 2% milk, cream, and half-and-half. Whole or full-fat cream cheese. Whole-fat or sweetened yogurt. Full-fat cheese. Nondairy creamers. Whipped toppings. Processed cheese and cheese spreads. Fats and oils Butter. Stick margarine. Lard. Shortening. Ghee. Bacon fat. Tropical oils, such as coconut, palm kernel, or palm oil. Seasoning and other foods Salted popcorn and pretzels. Onion salt, garlic salt, seasoned salt, table salt, and sea salt. Worcestershire sauce. Tartar sauce. Barbecue sauce. Teriyaki sauce. Soy sauce, including reduced-sodium. Steak sauce. Canned and packaged gravies. Fish sauce. Oyster sauce. Cocktail sauce. Horseradish that you find on the shelf. Ketchup. Mustard. Meat flavorings and tenderizers. Bouillon cubes. Hot sauce and Tabasco sauce. Premade or packaged marinades. Premade or packaged taco seasonings. Relishes. Regular salad dressings. Where to find more information:  National Heart, Lung, and [place]: https://wilson-eaton.com/  American Heart Association: www.heart.org Summary  The DASH eating plan is a healthy eating plan that has been shown to reduce high blood pressure (hypertension). It may also reduce your risk for type 2 diabetes, heart disease, and stroke.  With the DASH eating plan, you should limit salt (sodium) intake to 2,300 mg a day. If you have hypertension, you may need to reduce your sodium intake to 1,500 mg a day.  When on the DASH eating plan, aim to eat more fresh fruits and vegetables, whole grains, lean proteins, low-fat dairy, and heart-healthy fats.  Work with your health care provider or diet and nutrition specialist (dietitian) to  adjust your eating plan to your individual calorie needs. This information is not intended to replace advice given to you by your health care provider. Make sure you discuss any questions you have with your health care provider. Document Released: 08/13/2011 Document Revised: 08/17/2016 Document Reviewed: 08/17/2016 Elsevier Interactive Patient Education  [employer].    If you need a refill on your cardiac medications before your next appointment, please call your pharmacy.

## 2018-02-15 NOTE — Progress Notes (Signed)
Remote ICD transmission.   

## 2018-02-15 NOTE — Telephone Encounter (Signed)
New Message: ° ° ° ° ° °Pt is returning a call for lab results. °

## 2018-02-16 ENCOUNTER — Encounter: Payer: Self-pay | Admitting: Cardiology

## 2018-02-28 MED FILL — LOSARTAN POTASSIUM 50 MG TA: 50 | 30 days supply | Qty: 30 | Fill #1

## 2018-03-01 ENCOUNTER — Other Ambulatory Visit: Payer: Medicare Other

## 2018-03-01 DIAGNOSIS — I5022 Chronic systolic (congestive) heart failure: Secondary | ICD-10-CM | POA: Diagnosis not present

## 2018-03-01 LAB — BASIC METABOLIC PANEL
BUN/Creatinine Ratio: 9 — ABNORMAL LOW (ref 12–28)
BUN: 13 mg/dL (ref 8–27)
CHLORIDE: 102 mmol/L (ref 96–106)
CO2: 26 mmol/L (ref 20–29)
CREATININE: 1.38 mg/dL — AB (ref 0.57–1.00)
Calcium: 8.6 mg/dL — ABNORMAL LOW (ref 8.7–10.3)
GFR calc Af Amer: 46 mL/min/{1.73_m2} — ABNORMAL LOW (ref >59)
GFR calc non Af Amer: 40 mL/min/{1.73_m2} — ABNORMAL LOW (ref >59)
GLUCOSE: 58 mg/dL — AB (ref 65–99)
Potassium: 4.2 mmol/L (ref 3.5–5.2)
SODIUM: 140 mmol/L (ref 134–144)

## 2018-03-10 LAB — CUP PACEART REMOTE DEVICE CHECK
Battery Remaining Longevity: 65 mo
Battery Remaining Percentage: 63 %
Battery Voltage: 2.95 V
Brady Statistic RV Percent Paced: 1 %
HIGH POWER IMPEDANCE MEASURED VALUE: 68 Ohm
HighPow Impedance: 68 Ohm
Implantable Lead Implant Date: 20141128
Implantable Lead Model: 7122
Lead Channel Impedance Value: 440 Ohm
Lead Channel Pacing Threshold Amplitude: 0.75 V
Lead Channel Pacing Threshold Pulse Width: 0.5 ms
Lead Channel Sensing Intrinsic Amplitude: 8.4 mV
Lead Channel Setting Sensing Sensitivity: 0.5 mV
MDC IDC LEAD LOCATION: 753860
MDC IDC PG IMPLANT DT: 20141128
MDC IDC PG SERIAL: 7136396
MDC IDC SESS DTM: 20190611080015
MDC IDC SET LEADCHNL RV PACING AMPLITUDE: 2.5 V
MDC IDC SET LEADCHNL RV PACING PULSEWIDTH: 0.5 ms

## 2018-03-15 MED FILL — ATORVASTATIN 10 MG TABLET: 10 | 90 days supply | Qty: 90 | Fill #3

## 2018-03-21 ENCOUNTER — Encounter: Payer: Medicare Other | Admitting: Surgery

## 2018-03-29 MED FILL — CARVEDILOL 12.5 MG TABLET: 12.5 | 30 days supply | Qty: 120 | Fill #2

## 2018-03-29 MED FILL — AMITRIPTYLINE HCL 25 MG TAB: 25 | 30 days supply | Qty: 90 | Fill #1

## 2018-03-29 MED FILL — LOSARTAN POTASSIUM 50 MG TA: 50 | 30 days supply | Qty: 30 | Fill #2

## 2018-04-05 ENCOUNTER — Telehealth: Payer: Self-pay | Admitting: Physician Assistant

## 2018-04-05 DIAGNOSIS — Z79899 Other long term (current) drug therapy: Secondary | ICD-10-CM

## 2018-04-05 DIAGNOSIS — M7989 Other specified soft tissue disorders: Secondary | ICD-10-CM

## 2018-04-05 MED ORDER — FUROSEMIDE 20 MG PO TABS: 40.0000 mg | ORAL_TABLET | Freq: Every day | ORAL | 3 refills | Status: DC

## 2018-04-05 MED FILL — HYDROCODON-APAP 7.5-325: 7.5-325 | 23 days supply | Qty: 90 | Fill #0

## 2018-04-05 NOTE — Telephone Encounter (Signed)
New Message   Pt c/o medication issue:  1. Name of Medication: furosemide (LASIX) 20 MG tablet  2. How are you currently taking this medication (dosage and times per day)? Take 1 tablet (20 mg total) by mouth daily.  3. Are you having a reaction (difficulty breathing--STAT)? no  4. What is your medication issue? Pt is calling states that her lasix dosage was changed from 40mg  to 20mg  and the pt feels like its not enough. Please call

## 2018-04-05 NOTE — Telephone Encounter (Signed)
Patient can increase her Lasix to 40 mg once daily but please talk to her about the importance of a 2 g sodium diet.  Her kidney function was up a little while we checked it so she goes back up to 40 mg daily which she needs to come in for a be met in 2 weeks.  Let her know this will affect her kidneys.

## 2018-04-05 NOTE — Telephone Encounter (Signed)
Called patient back. Patient complaining of BLE swelling. Patient stated this has been going on since her dose changed. Patient wants to go back to Lasix 40 mg daily. Will forward to Jacolyn Reedy PA for advisement.

## 2018-04-05 NOTE — Telephone Encounter (Signed)
Left message for patient to call back  

## 2018-04-05 NOTE — Telephone Encounter (Signed)
Patient called back. Informed patient of Jacolyn Reedy PA recommendations. Informed patient the importance of keeping a low sodium diet and how lasix affects her kidneys. Patient will come in on 04/20/18 for repeat lab work. Patient verbalized understanding.

## 2018-04-06 MED FILL — FUROSEMIDE 20 MG TABS: 20 | 90 days supply | Qty: 180 | Fill #0

## 2018-04-08 ENCOUNTER — Other Ambulatory Visit: Payer: Self-pay

## 2018-04-08 ENCOUNTER — Encounter: Payer: Self-pay | Admitting: Vascular Surgery

## 2018-04-08 ENCOUNTER — Ambulatory Visit (INDEPENDENT_AMBULATORY_CARE_PROVIDER_SITE_OTHER): Payer: Medicare Other | Admitting: Vascular Surgery

## 2018-04-08 VITALS — BP 119/84 | HR 80 | Temp 98.2°F | Resp 16 | Ht 63.5 in | Wt 208.0 lb

## 2018-04-08 DIAGNOSIS — I723 Aneurysm of iliac artery: Secondary | ICD-10-CM

## 2018-04-08 NOTE — Progress Notes (Signed)
Patient ID: Lori Beard, female   DOB: [birth year], 67 y.o.   MRN: [id number]  Reason for Consult: New Patient (Initial Visit) (right common iliac artery aneurysm)   Referred by Alroy Dust, L.Marlou Sa, MD  Subjective:     HPI:  Lori Beard is a 67 y.o. female with past medical history of hypertension and cardiomyopathy also has a gastric band in the past for morbid obesity I was performed in 2014.  She recently underwent CT scan for abdominal pain.  At that time she was found to have a right common iliac artery aneurysm.  She has no lower extremity symptoms only issue she is had a surgery on her knee in the past but she is recovered well from this.  She does not have any personal or family history of other aneurysms.  Her CAT scan was done without contrast given a history of chronic kidney disease.  She does not have any further abdominal pain.  Past Medical History:  Diagnosis Date  . Aneurysm of right iliac artery (HCC)   . Arthritis    ALL OVER  . Cardiac arrest ([place]) 08/01/2013   Polymorphic VT  . Chronic systolic CHF (congestive heart failure) ([place])   . GERD (gastroesophageal reflux disease)   . Hypertension   . Morbid obesity ([place])    a. s/p lapband surgery 11/2012.  [name] Myocardial infarction ([place]) 08/06/13  . NICM (nonischemic cardiomyopathy) ([place])    Echocardiogram (08/01/13): Mild LVH, global HK, EF 40-45%, Gr 1 DD, MAC, mild RVE, mildly reduced RVSF, PASP 31-35.  LHC (08/02/13):  Normal coronary arteries, EF 35%, LVEDP 17.  . Pneumonia 08/01/2013  . Renal failure, acute ([place]) 08/01/2013  . S/P implantation of automatic cardioverter/defibrillator (AICD)    07/2013 [facility])  . Sleep apnea 02/15/2012   SLEEP STUDY IN EPIC - MILD OSA-CPAP NOT RECOMMENDED-HOME OXYGEN SUGGESTED BECAUSE OF OXYGEN DESATS ON ROOM AIR.  [name] Ventricular tachycardia (HCC)    a. R on T PVC during admission for pneumonia/NSTEMI => monomorphic VT=>VF=>defib; s/p ICD   Family History  Problem Relation  Age of Onset  . Kidney disease Mother   . Heart disease Father   . Hypertension Father   . Colon cancer Father   . Stroke Paternal Grandmother   . Hypertension Paternal Grandmother   . Breast cancer Sister   . Heart attack Neg Hx    Past Surgical History:  Procedure Laterality Date  . ABDOMINAL HYSTERECTOMY    . BREATH TEK H PYLORI  02/15/2012   Procedure: BREATH TEK H PYLORI;  Surgeon: Pedro Earls, MD;  Location: [facility];  Service: General;  Laterality: N/A;  745  . CARDIAC CATHETERIZATION  08/02/2013   Normal coronary arteries, EF 35%   . CHOLECYSTECTOMY    . IMPLANTABLE CARDIOVERTER DEFIBRILLATOR IMPLANT N/A 08/04/2013   Procedure: IMPLANTABLE CARDIOVERTER DEFIBRILLATOR IMPLANT;  Surgeon: Evans Lance, MD;  Location[facility];  Service: Cardiovascular;  Laterality: N/A;  . JOINT REPLACEMENT     BILATERAL TOTAL HIP REPLACEMENTS  . KNEE ARTHROSCOPY    . LAPAROSCOPIC GASTRIC BANDING    . LEFT HEART CATHETERIZATION WITH CORONARY ANGIOGRAM N/A 08/02/2013   Procedure: LEFT HEART CATHETERIZATION WITH CORONARY ANGIOGRAM;  Surgeon: Wellington Hampshire, MD;  Location: [facility] CATH LAB;  Service: Cardiovascular;  Laterality: N/A;  . MESH APPLIED TO LAP PORT N/A 11/22/2012   Procedure: MESH APPLIED TO LAP PORT;  Surgeon: Pedro Earls, MD;  Location: [facility];  Service: General;  Laterality:  N/A;  . PACEMAKER INSERTION   08/04/2013   St. [name] single chamber ICD    Short Social History:  Social History   Tobacco Use  . Smoking status: Never Smoker  . Smokeless tobacco: Never Used  Substance Use Topics  . Alcohol use: No    Allergies  Allergen Reactions  . Ace Inhibitors Cough  . Iron Other (See Comments)    Gives patient migraine headaches when supplemented  . Morphine And Related Itching and Rash    Current Outpatient Medications  Medication Sig Dispense Refill  . amitriptyline (ELAVIL) 25 MG tablet Take 75 mg by mouth at bedtime.    [name] aspirin 81 MG tablet Take 1  tablet (81 mg total) by mouth every evening.    [name] atorvastatin (LIPITOR) 10 MG tablet TAKE 1 TABLET BY MOUTH ONCE DAILY AT 6 PM 15 tablet 0  . carvedilol (COREG) 12.5 MG tablet TAKE 2 TABLETS BY MOUTH TWICE DAILY WITH MEALS 360 tablet 2  . cetirizine (ZYRTEC) 5 MG tablet Take 1 tablet (5 mg total) by mouth daily. (Patient taking differently: Take 5 mg by mouth as needed. ) 14 tablet 1  . furosemide (LASIX) 20 MG tablet Take 2 tablets (40 mg total) by mouth daily. 180 tablet 3  . hydrocerin (EUCERIN) CREA Apply 1 application topically 2 (two) times daily. 454 g 0  . HYDROcodone-acetaminophen (NORCO) 7.5-325 MG tablet Take 1 tablet by mouth every 8 (eight) hours as needed for moderate pain or severe pain.   0  . losartan (COZAAR) 50 MG tablet Take 1 tablet (50 mg total) by mouth daily. 30 tablet 2  . omeprazole (PRILOSEC) 20 MG capsule Take 1 capsule (20 mg total) by mouth daily. 90 capsule 3  . polyethylene glycol (MIRALAX / GLYCOLAX) packet Take 17 g by mouth 2 (two) times daily as needed (constipation. Goal to have bowel movement at least every other day).    . potassium chloride SA (K-DUR,KLOR-CON) 20 MEQ tablet Take 1 tablet (20 mEq total) by mouth daily. 90 tablet 3  . triamcinolone ointment (KENALOG) 0.5 % Apply 1 application topically 2 (two) times daily. 30 g 0   No current facility-administered medications for this visit.     Review of Systems  Constitutional:  Constitutional negative. HENT: HENT negative.  Eyes: Eyes negative.  Respiratory: Respiratory negative.  Cardiovascular: Positive for leg swelling.  GI: Gastrointestinal negative.  Musculoskeletal: Musculoskeletal negative.  Skin: Skin negative.  Neurological: Neurological negative. Hematologic: Hematologic/lymphatic negative.  Psychiatric: Psychiatric negative.        Objective:  Objective   Vitals:   04/08/18 1415  BP: 119/84  Pulse: 80  Resp: 16  Temp: 98.2 F (36.8 C)  TempSrc: Oral  SpO2: 99%  Weight:  208 lb (94.3 kg)  Height: 5' 3.5" (1.613 m)   Body mass index is 36.27 kg/m.  Physical Exam  Constitutional: She is oriented to person, place, and time. She appears well-developed.  HENT:  Head: Normocephalic.  Eyes: Pupils are equal, round, and reactive to light.  Neck: Normal range of motion.  Cardiovascular: Normal rate.  Pulses:      Carotid pulses are 2+ on the right side, and 2+ on the left side.      Radial pulses are 2+ on the right side, and 2+ on the left side.       Femoral pulses are 2+ on the right side, and 2+ on the left side.      Dorsalis pedis pulses are  2+ on the right side, and 2+ on the left side.  Abdominal: Soft. She exhibits no mass.  Musculoskeletal: Normal range of motion. She exhibits no edema.  Neurological: She is alert and oriented to person, place, and time.  Skin: Skin is warm and dry.  Psychiatric: She has a normal mood and affect. Her behavior is normal. Judgment and thought content normal.    Data: We reviewed her CT scan together which is a noncontrasted scan of her abdomen and pelvis demonstrates 2.6 cm right common iliac artery.  Her aorta measures 2.2 cm in the infrarenal location.     Assessment/Plan:     67 year old female presents with asymptomatic right common iliac artery aneurysm which was found on scan for abdominal pain likely unrelated and her abdominal pain is subsequently subsided.  We reviewed her CT scan together.  I discussed with her the risk of rupture and low risk of thrombosis of an aneurysm in this location.  We will need to keep a watch on this and I would consider repair when it reaches near 4 cm.  She demonstrates good understanding she will follow-up in 1 year with aortoiliac duplex.     Waynetta Sandy MD [facility]

## 2018-04-13 ENCOUNTER — Encounter (HOSPITAL_COMMUNITY): Payer: Medicare Other

## 2018-04-13 ENCOUNTER — Ambulatory Visit: Payer: Medicare Other

## 2018-04-18 MED FILL — POTASSIUM CL ER 20 MEQ TAB: 20 | 90 days supply | Qty: 180 | Fill #3

## 2018-04-19 ENCOUNTER — Other Ambulatory Visit: Payer: Medicare Other

## 2018-04-19 DIAGNOSIS — Z79899 Other long term (current) drug therapy: Secondary | ICD-10-CM

## 2018-04-19 DIAGNOSIS — M7989 Other specified soft tissue disorders: Secondary | ICD-10-CM | POA: Diagnosis not present

## 2018-04-19 LAB — BASIC METABOLIC PANEL
BUN / CREAT RATIO: 11 — AB (ref 12–28)
BUN: 15 mg/dL (ref 8–27)
CALCIUM: 8.8 mg/dL (ref 8.7–10.3)
CHLORIDE: 104 mmol/L (ref 96–106)
CO2: 26 mmol/L (ref 20–29)
Creatinine, Ser: 1.42 mg/dL — ABNORMAL HIGH (ref 0.57–1.00)
GFR calc Af Amer: 44 mL/min/{1.73_m2} — ABNORMAL LOW (ref >59)
GFR calc non Af Amer: 38 mL/min/{1.73_m2} — ABNORMAL LOW (ref >59)
Glucose: 75 mg/dL (ref 65–99)
POTASSIUM: 4.1 mmol/L (ref 3.5–5.2)
Sodium: 141 mmol/L (ref 134–144)

## 2018-04-20 ENCOUNTER — Other Ambulatory Visit: Payer: Medicare Other

## 2018-04-20 ENCOUNTER — Telehealth: Payer: Self-pay | Admitting: Physician Assistant

## 2018-04-20 DIAGNOSIS — I5022 Chronic systolic (congestive) heart failure: Secondary | ICD-10-CM

## 2018-04-20 MED ORDER — FUROSEMIDE 20 MG PO TABS
ORAL_TABLET | ORAL | 3 refills | Status: DC
Start: 2018-04-20 — End: 2018-05-24

## 2018-04-20 NOTE — Telephone Encounter (Signed)
Called patient and made her aware of recommendations to alternate taking lasix 40 mg one day and then 20 mg the next. BMET ordered and scheduled for 9/16. Patient verbalized understanding and thanked me or the call.

## 2018-04-20 NOTE — Telephone Encounter (Signed)
New Message ° ° °Pt returning call for nurse °

## 2018-04-20 NOTE — Telephone Encounter (Signed)
-----   Message from Dyann Kief, PA-C sent at 04/20/2018  7:44 AM EDT ----- Please ask patient to reduce lasix to alternating lasix 40 mg/20 mg every other day. Repeat bmet in 1 month. Thanks, Elon Jester ----- Message ----- From: Lattie Haw, RN Sent: 04/19/2018   5:02 PM EDT To: Dyann Kief, PA-C  Patient returning call. Patient made aware of lab results. Patient states that she has been taking lasix 40 mg QD as instructed to in telephone encounter 7/30. Patient states that her LEE has resolved since being on the higher dose. Patient states that she has been avoiding salt in her diet. She denies SOB or weight gain. Instructed patient to continue current dose of lasix and the information would be forwarded for review and she would be contacted if there were additional recommendations. Patient verbalized understanding and thanked me for the call.

## 2018-04-26 DIAGNOSIS — Z4651 Encounter for fitting and adjustment of gastric lap band: Secondary | ICD-10-CM | POA: Diagnosis not present

## 2018-04-29 MED FILL — AMITRIPTYLINE HCL 25 MG TAB: 25 | 30 days supply | Qty: 90 | Fill #2

## 2018-04-29 MED FILL — CARVEDILOL 12.5 MG TABLET: 12.5 | 30 days supply | Qty: 120 | Fill #3

## 2018-05-12 MED FILL — LOSARTAN POTASSIUM 50 MG TA: 50 | 90 days supply | Qty: 90 | Fill #0

## 2018-05-17 ENCOUNTER — Ambulatory Visit (INDEPENDENT_AMBULATORY_CARE_PROVIDER_SITE_OTHER): Payer: Medicare Other | Admitting: *Deleted

## 2018-05-17 DIAGNOSIS — I428 Other cardiomyopathies: Secondary | ICD-10-CM

## 2018-05-17 NOTE — Progress Notes (Signed)
Remote ICD transmission.   

## 2018-05-23 ENCOUNTER — Other Ambulatory Visit: Payer: Medicare Other | Admitting: *Deleted

## 2018-05-23 DIAGNOSIS — I5022 Chronic systolic (congestive) heart failure: Secondary | ICD-10-CM

## 2018-05-23 LAB — BASIC METABOLIC PANEL
BUN/Creatinine Ratio: 11 — ABNORMAL LOW (ref 12–28)
BUN: 17 mg/dL (ref 8–27)
CHLORIDE: 105 mmol/L (ref 96–106)
CO2: 24 mmol/L (ref 20–29)
Calcium: 8.6 mg/dL — ABNORMAL LOW (ref 8.7–10.3)
Creatinine, Ser: 1.52 mg/dL — ABNORMAL HIGH (ref 0.57–1.00)
GFR, EST AFRICAN AMERICAN: 41 mL/min/{1.73_m2} — AB (ref >59)
GFR, EST NON AFRICAN AMERICAN: 35 mL/min/{1.73_m2} — AB (ref >59)
Glucose: 116 mg/dL — ABNORMAL HIGH (ref 65–99)
Potassium: 4.2 mmol/L (ref 3.5–5.2)
Sodium: 142 mmol/L (ref 134–144)

## 2018-05-24 ENCOUNTER — Emergency Department (HOSPITAL_COMMUNITY)
Admission: EM | Admit: 2018-05-24 | Discharge: 2018-05-25 | Disposition: A | Discharge status: Home / Self Care | Payer: Medicare Other | Attending: Emergency Medicine | Admitting: Emergency Medicine

## 2018-05-24 ENCOUNTER — Encounter (HOSPITAL_COMMUNITY): Payer: Self-pay

## 2018-05-24 ENCOUNTER — Other Ambulatory Visit: Payer: Self-pay

## 2018-05-24 DIAGNOSIS — Z79899 Other long term (current) drug therapy: Secondary | ICD-10-CM | POA: Diagnosis not present

## 2018-05-24 DIAGNOSIS — R202 Paresthesia of skin: Secondary | ICD-10-CM

## 2018-05-24 DIAGNOSIS — I5022 Chronic systolic (congestive) heart failure: Secondary | ICD-10-CM | POA: Diagnosis not present

## 2018-05-24 DIAGNOSIS — Z96643 Presence of artificial hip joint, bilateral: Secondary | ICD-10-CM | POA: Insufficient documentation

## 2018-05-24 DIAGNOSIS — Z7982 Long term (current) use of aspirin: Secondary | ICD-10-CM | POA: Diagnosis not present

## 2018-05-24 DIAGNOSIS — I110 Hypertensive heart disease with heart failure: Secondary | ICD-10-CM | POA: Diagnosis not present

## 2018-05-24 MED ORDER — FUROSEMIDE 20 MG PO TABS: ORAL_TABLET | ORAL | 3 refills | Status: DC

## 2018-05-24 NOTE — ED Triage Notes (Signed)
Pt complains of left arm pain and tingling from her shoulder to her finger tips since earlier today, pt denies any injury Pt is positive for pulses

## 2018-05-25 ENCOUNTER — Other Ambulatory Visit: Payer: Self-pay

## 2018-05-25 MED ORDER — GABAPENTIN 300 MG PO CAPS: 300.0000 mg | ORAL_CAPSULE | Freq: Three times a day (TID) | ORAL | 0 refills | Status: DC | PRN

## 2018-05-25 MED FILL — GABAPENTIN 300 MG CAPSULE: 300 | 10 days supply | Qty: 30 | Fill #0

## 2018-05-25 NOTE — Discharge Instructions (Signed)
Try taking Tylenol for your discomfort.  May alternatively take the gabapentin, as needed, for nerve related pain. Follow-up with the orthopedist or neurologist on this matter.  Return to the ED for worsening pain, weakness, complete numbness, or any other major concerns.

## 2018-05-25 NOTE — ED Provider Notes (Signed)
[facility] DEPT Provider Note   CSN: [id number] Arrival date & time: 05/24/18  2306     History   Chief Complaint Chief Complaint  Patient presents with  . Arm Pain    HPI Lori Beard is a 67 y.o. female.  HPI   Lori Beard is a 67 y.o. female, with a history of CHF, HTN, MI, presenting to the ED with tingling in the left upper extremity beginning 3 to 4 days ago.  States the tingling was there when she awoke in the morning.  Tingling has been waxing and waning.  Denies weakness, numbness, chest pain, shortness of breath, fever, injury, neck/back pain, or any other complaints.   Past Medical History:  Diagnosis Date  . Aneurysm of right iliac artery (HCC)   . Arthritis    ALL OVER  . Cardiac arrest ([place]) 08/01/2013   Polymorphic VT  . Chronic systolic CHF (congestive heart failure) ([place])   . GERD (gastroesophageal reflux disease)   . Hypertension   . Morbid obesity ([place])    a. s/p lapband surgery 11/2012.  [name] Myocardial infarction ([place]) 08/06/13  . NICM (nonischemic cardiomyopathy) ([place])    Echocardiogram (08/01/13): Mild LVH, global HK, EF 40-45%, Gr 1 DD, MAC, mild RVE, mildly reduced RVSF, PASP 31-35.  LHC (08/02/13):  Normal coronary arteries, EF 35%, LVEDP 17.  . Pneumonia 08/01/2013  . Renal failure, acute ([place]) 08/01/2013  . S/P implantation of automatic cardioverter/defibrillator (AICD)    07/2013 Lovena Le)  . Sleep apnea 02/15/2012   SLEEP STUDY IN EPIC - MILD OSA-CPAP NOT RECOMMENDED-HOME OXYGEN SUGGESTED BECAUSE OF OXYGEN DESATS ON ROOM AIR.  [name] Ventricular tachycardia (HCC)    a. R on T PVC during admission for pneumonia/NSTEMI => monomorphic VT=>VF=>defib; s/p ICD    Patient Active Problem List   Diagnosis Date Noted  . AKI (acute kidney injury) ([place]) 01/22/2018  . Right iliac artery stenosis ([place]) 01/22/2018  . Constipation 01/22/2018  . Weight loss 01/07/2018  . Morbid obesity ([place])   . Hypertension   . GERD  (gastroesophageal reflux disease)   . Chronic systolic CHF (congestive heart failure) ([place])   . S/P implantation of automatic cardioverter/defibrillator (AICD)   . History of MI (myocardial infarction) 08/06/2013  . NICM (nonischemic cardiomyopathy) ([place]) 08/03/2013  . Hypokalemia 08/02/2013  . HTN (hypertension) 08/01/2013  . Anemia 08/01/2013  . Thrombocytopenia ([place]) 08/01/2013  . Lapband APS + hiatus hernia repair March 2014 11/23/2012  . Arthritis-bilateral hip replacements 11/10/2012  . Sleep apnea 02/15/2012  . Obesity 01/22/2012    Past Surgical History:  Procedure Laterality Date  . ABDOMINAL HYSTERECTOMY    . BREATH TEK H PYLORI  02/15/2012   Procedure: BREATH TEK H PYLORI;  Surgeon: Pedro Earls, MD;  Location: [facility];  Service: General;  Laterality: N/A;  745  . CARDIAC CATHETERIZATION  08/02/2013   Normal coronary arteries, EF 35%   . CHOLECYSTECTOMY    . IMPLANTABLE CARDIOVERTER DEFIBRILLATOR IMPLANT N/A 08/04/2013   Procedure: IMPLANTABLE CARDIOVERTER DEFIBRILLATOR IMPLANT;  Surgeon: Evans Lance, MD;  Location: [facility] CATH LAB;  Service: Cardiovascular;  Laterality: N/A;  . JOINT REPLACEMENT     BILATERAL TOTAL HIP REPLACEMENTS  . KNEE ARTHROSCOPY    . LAPAROSCOPIC GASTRIC BANDING    . LEFT HEART CATHETERIZATION WITH CORONARY ANGIOGRAM N/A 08/02/2013   Procedure: LEFT HEART CATHETERIZATION WITH CORONARY ANGIOGRAM;  Surgeon: Wellington Hampshire, MD;  Location: [facility] CATH LAB;  Service: Cardiovascular;  Laterality: N/A;  .  MESH APPLIED TO LAP PORT N/A 11/22/2012   Procedure: MESH APPLIED TO LAP PORT;  Surgeon: Pedro Earls, MD;  Location: [facility] ORS;  Service: General;  Laterality: N/A;  . PACEMAKER INSERTION   08/04/2013   St. [name] single chamber ICD     OB History   None      Home Medications    Prior to Admission medications   Medication Sig Start Date End Date Taking? Authorizing Provider  amitriptyline (ELAVIL) 25 MG tablet Take 75 mg by mouth at  bedtime. 04/12/12   Rise Mu, PA-C  aspirin 81 MG tablet Take 1 tablet (81 mg total) by mouth every evening. 10/06/13   Nahser, Wonda Cheng, MD  atorvastatin (LIPITOR) 10 MG tablet TAKE 1 TABLET BY MOUTH ONCE DAILY AT 6 PM 03/31/17   Nahser, Wonda Cheng, MD  carvedilol (COREG) 12.5 MG tablet TAKE 2 TABLETS BY MOUTH TWICE DAILY WITH MEALS 09/13/17   Nahser, Wonda Cheng, MD  cetirizine (ZYRTEC) 5 MG tablet Take 1 tablet (5 mg total) by mouth daily. Patient taking differently: Take 5 mg by mouth as needed.  05/04/17   Ivar Drape D, PA  furosemide (LASIX) 20 MG tablet Take 1 tablet (20 mg total) by mouth every morning. 05/24/18   Imogene Burn, PA-C  hydrocerin (EUCERIN) CREA Apply 1 application topically 2 (two) times daily. 01/23/18   Samuella Cota, MD  HYDROcodone-acetaminophen (NORCO) 7.5-325 MG tablet Take 1 tablet by mouth every 8 (eight) hours as needed for moderate pain or severe pain.  01/07/16   [provider]  losartan (COZAAR) 50 MG tablet Take 1 tablet (50 mg total) by mouth daily. 01/09/18   Roxan Hockey, MD  omeprazole (PRILOSEC) 20 MG capsule Take 1 capsule (20 mg total) by mouth daily. 09/21/17   Evans Lance, MD  polyethylene glycol [facility] / Floria Raveling) packet Take 17 g by mouth 2 (two) times daily as needed (constipation. Goal to have bowel movement at least every other day). 01/23/18   Samuella Cota, MD  potassium chloride SA (K-DUR,KLOR-CON) 20 MEQ tablet Take 1 tablet (20 mEq total) by mouth daily. 02/15/18   Imogene Burn, PA-C  triamcinolone ointment (KENALOG) 0.5 % Apply 1 application topically 2 (two) times daily. 01/18/18   Harrison Mons, PA    Family History Family History  Problem Relation Age of Onset  . Kidney disease Mother   . Heart disease Father   . Hypertension Father   . Colon cancer Father   . Stroke Paternal Grandmother   . Hypertension Paternal Grandmother   . Breast cancer Sister   . Heart attack Neg Hx     Social History Social  History   Tobacco Use  . Smoking status: Never Smoker  . Smokeless tobacco: Never Used  Substance Use Topics  . Alcohol use: No  . Drug use: No     Allergies   Ace inhibitors; Iron; and Morphine and related   Review of Systems Review of Systems  Constitutional: Negative for chills, diaphoresis and fever.  Respiratory: Negative for shortness of breath.   Cardiovascular: Negative for chest pain and leg swelling.  Gastrointestinal: Negative for nausea and vomiting.  Musculoskeletal: Negative for back pain and neck pain.  Neurological: Negative for weakness, numbness and headaches.       Tingling in the left upper extremity.  All other systems reviewed and are negative.    Physical Exam Updated Vital Signs BP 126/72 (BP Location: Right Arm)  Pulse (!) 56   Temp 98.5 F (36.9 C) (Oral)   Resp 15   Ht _0  (1.6 m)   Wt 94.8 kg   SpO2 97%   BMI 37.02 kg/m   Physical Exam  Constitutional: She appears well-developed and well-nourished. No distress.  HENT:  Head: Normocephalic and atraumatic.  Eyes: Conjunctivae are normal.  Neck: Normal range of motion. Neck supple.  Cardiovascular: Normal rate, regular rhythm, normal heart sounds and intact distal pulses.  Pulses:      Radial pulses are 2+ on the right side, and 2+ on the left side.  Pulmonary/Chest: Effort normal and breath sounds normal. No respiratory distress.  Abdominal: There is no guarding.  Musculoskeletal: She exhibits tenderness. She exhibits no edema.  Endorses some tenderness to the left anterior shoulder and axillary region.  Full range of motion through the cardinal directions of the left shoulder without noted difficulty or hesitation. Normal motor function intact in all extremities. No midline spinal tenderness.   Lymphadenopathy:    She has no cervical adenopathy.  Neurological: She is alert.  Sensation grossly intact to light touch through each of the nerve distributions of the bilateral upper  extremities. Abduction and adduction of the fingers intact against resistance. Grip strength equal bilaterally. Supination and pronation intact against resistance. Strength 5/5 through the cardinal directions of the bilateral wrists. Strength 5/5 with flexion and extension of the bilateral elbows. Patient can touch the thumb to each one of the fingertips without difficulty.   Skin: Skin is warm and dry. Capillary refill takes less than 2 seconds. She is not diaphoretic.  Psychiatric: She has a normal mood and affect. Her behavior is normal.  Nursing note and vitals reviewed.    ED Treatments / Results  Labs (all labs ordered are listed, but only abnormal results are displayed) Labs Reviewed - No data to display  EKG EKG Interpretation  Date/Time:  Tuesday May 24 2018 23:15:29 EDT Ventricular Rate:  69 PR Interval:    QRS Duration: 118 QT Interval:  379 QTC Calculation: 406 R Axis:   -63 Text Interpretation:  Sinus rhythm Consider left atrial enlargement LAD, consider left anterior fascicular block Borderline T abnormalities, diffuse leads Borderline ST elevation, lateral leads No significant change was found Confirmed by Shanon Rosser [id number]) on 05/24/2018 11:24:46 PM   Radiology No results found.  Procedures Procedures (including critical care time)  Medications Ordered in ED Medications - No data to display   Initial Impression / Assessment and Plan / ED Course  I have reviewed the triage vital signs and the nursing notes.  Pertinent labs & imaging results that were available during my care of the patient were reviewed by me and considered in my medical decision making (see chart for details).     Patient presents with tingling to the left arm.  Appears to be neurovascularly intact on bedside exam.  Orthopedic versus neurology follow-up.  Return precautions discussed.  Patient voices understanding of these instructions, accepts the plan, and is comfortable with  discharge.  Findings and plan of care discussed with Dr. Florina Ou. Dr. Florina Ou personally evaluated and examined this patient.   Final Clinical Impressions(s) / ED Diagnoses   Final diagnoses:  Tingling of left upper extremity    ED Discharge Orders    None       Layla Maw 05/25/18 0452    Shanon Rosser, MD 05/25/18 [phone]

## 2018-06-05 IMAGING — DX DG ABDOMEN ACUTE W/ 1V CHEST
3 series · 3 of 3 positions shown · non-contrast
Comparison: Chest radiograph performed 01/07/2018, and pelvic
radiograph performed 06/27/2009

CLINICAL DATA: Lack of bowel movements for 1 month.

EXAM:
DG ABDOMEN ACUTE W/ 1V CHEST

[chest pa]
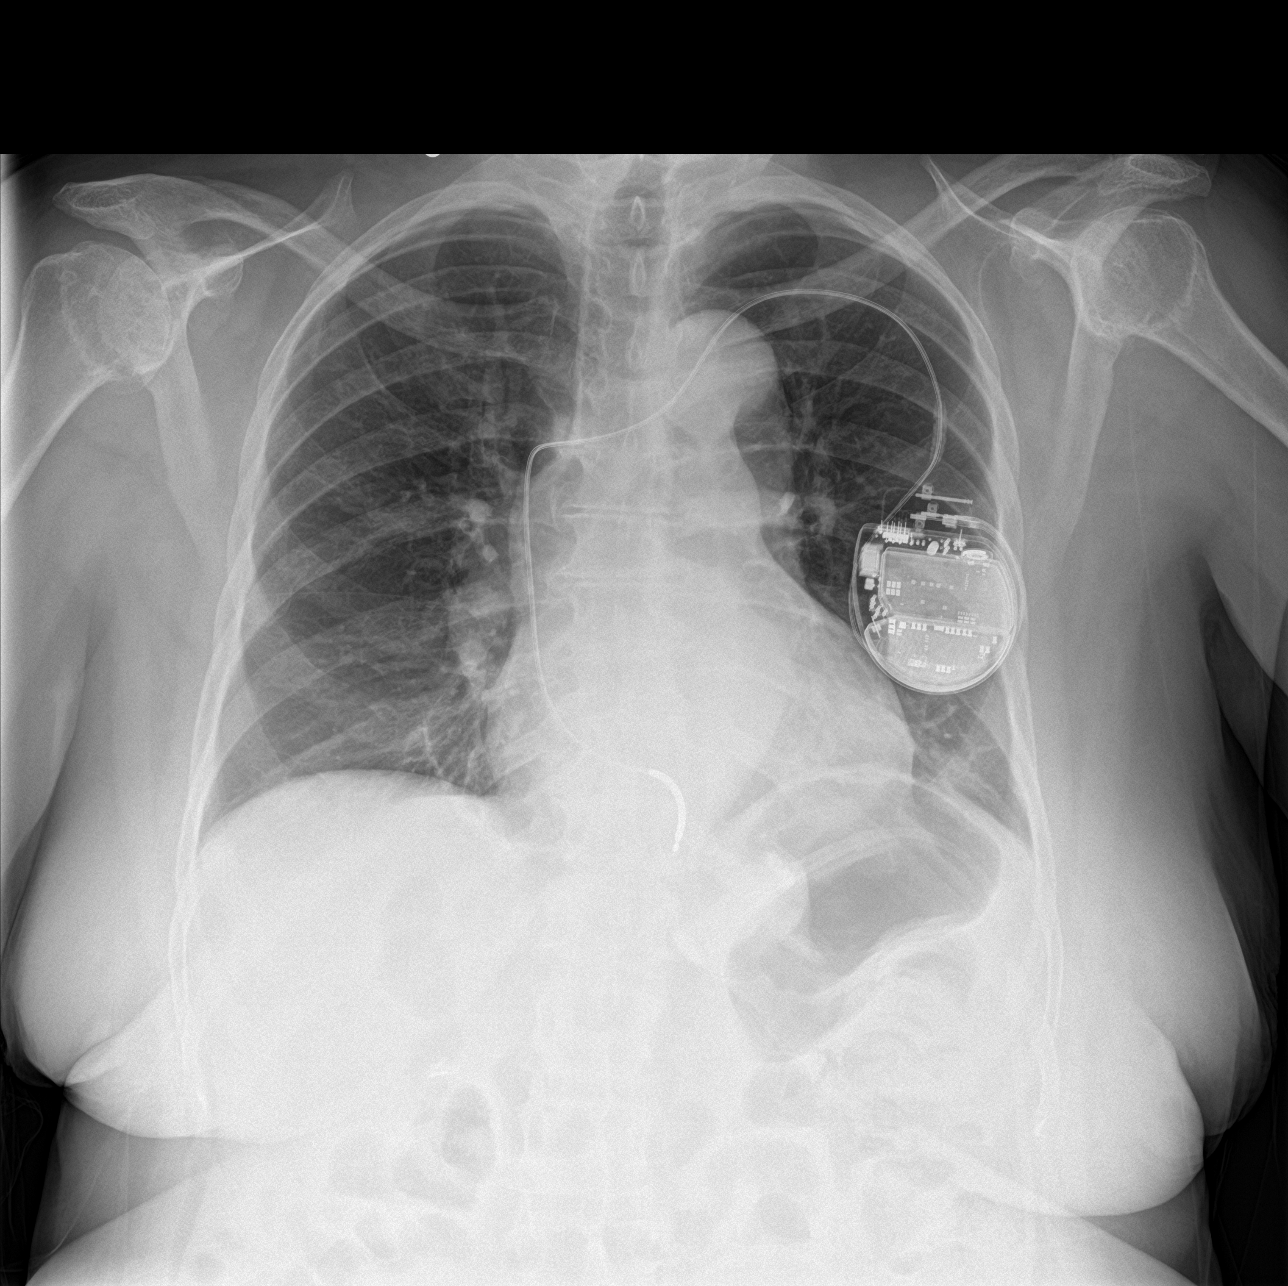

[abdomen supine]
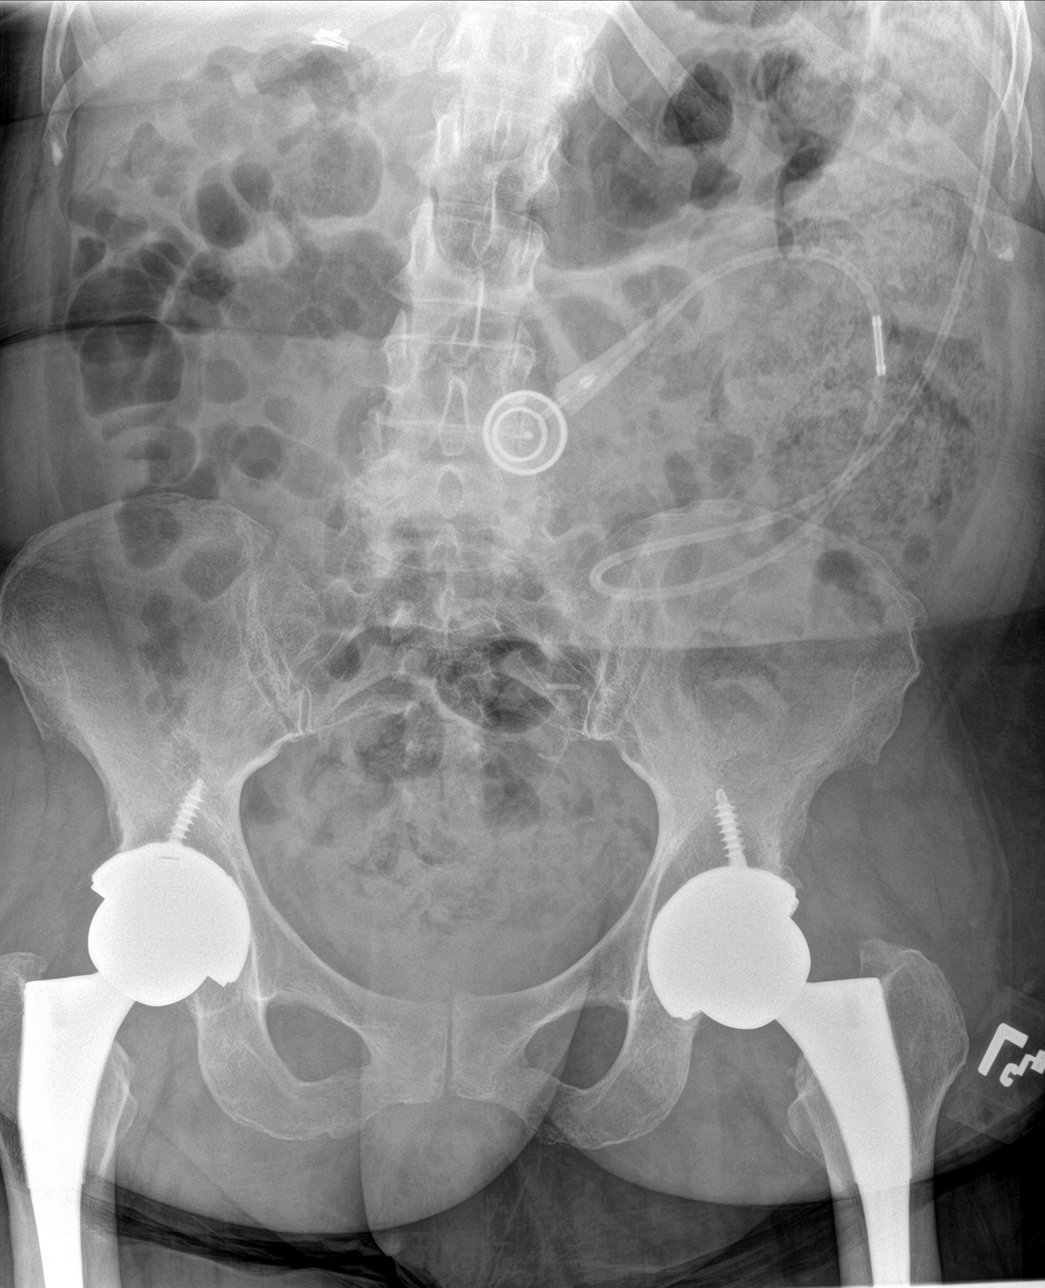

[abdomen erect]
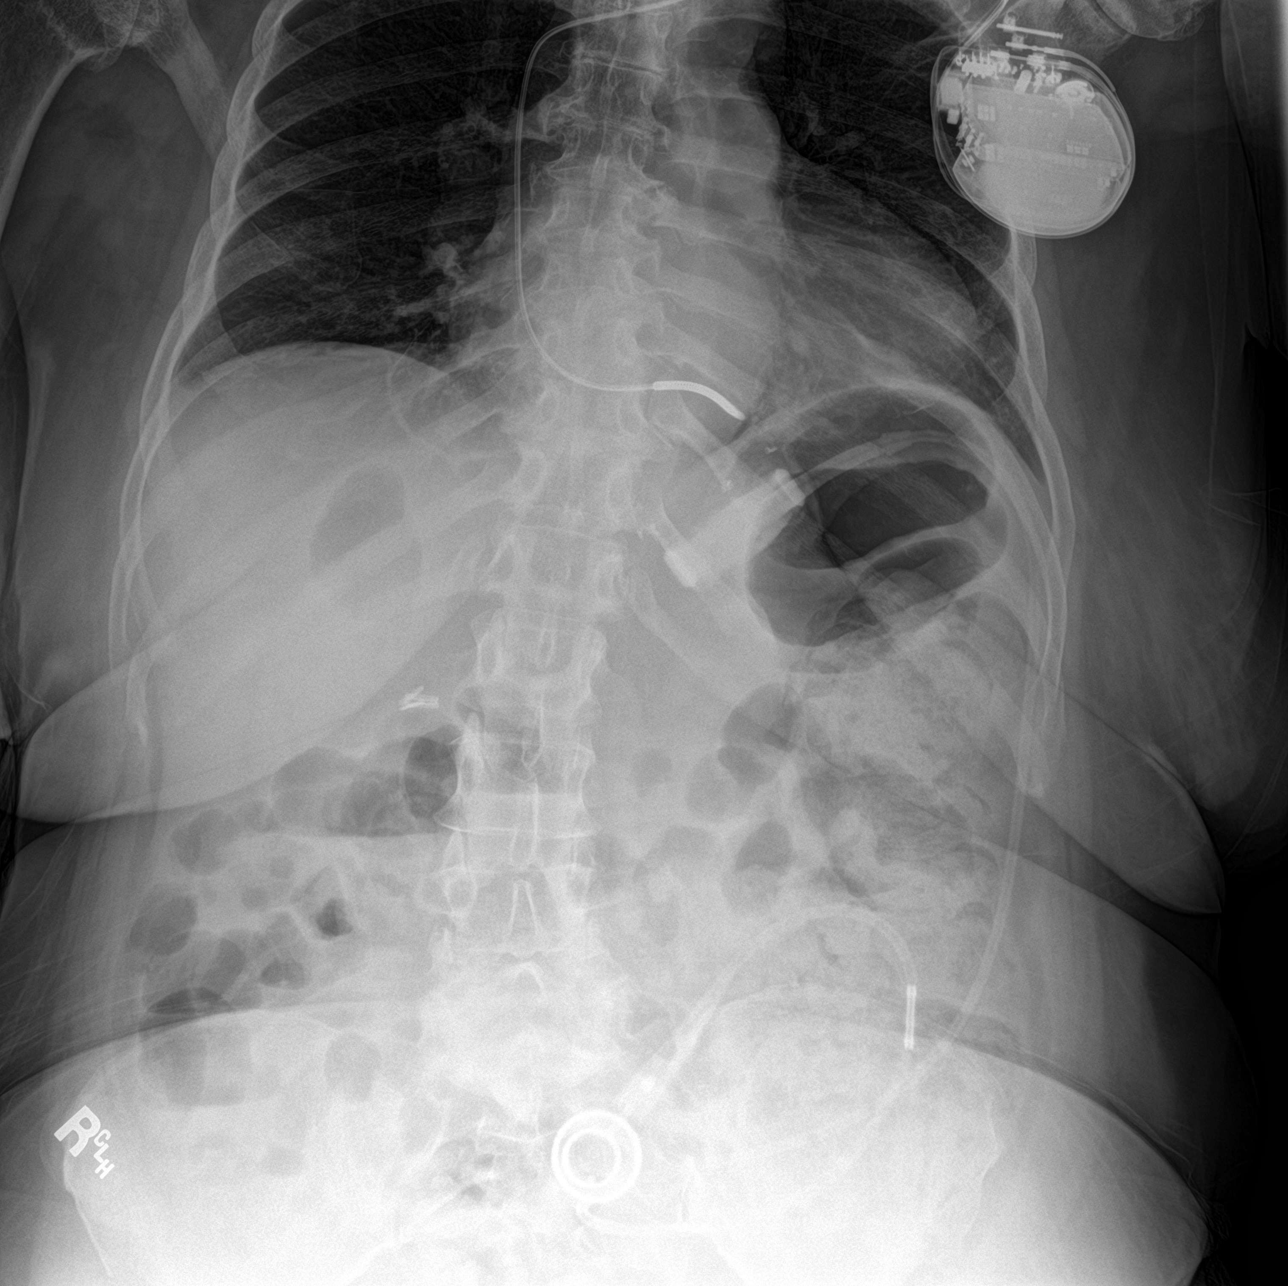

[3 of 3 positions shown; findings below may reference images not displayed]

FINDINGS: The lungs are well-aerated. Mild left basilar airspace opacity
likely reflects atelectasis. There is no evidence of pleural
effusion or pneumothorax. The cardiomediastinal silhouette is within
normal limits. An AICD is noted at the left chest wall, with a
single lead ending at the right ventricle.

The visualized bowel gas pattern is unremarkable. Scattered stool
and air are seen within the colon; there is no evidence of small
bowel dilatation to suggest obstruction. No free intra-abdominal air
is identified on the provided upright view.

A gastric band is grossly unremarkable in appearance.

No acute osseous abnormalities are seen; the sacroiliac joints are
unremarkable in appearance. Bilateral hip arthroplasties are grossly
unremarkable in appearance, though incompletely imaged.
IMPRESSION: 1. Unremarkable bowel gas pattern; no free intra-abdominal air seen.
Moderate amount of stool noted in the colon, without significant
radiographic evidence for constipation.
2. Mild left basilar airspace opacity likely reflects atelectasis.
Lungs otherwise grossly clear.

## 2018-06-06 IMAGING — CT CT ABD-PELV W/O CM
2 of 4 series · 16 of 46 positions shown, 18 images · non-contrast
Comparison: Plain films 01/21/2018

CLINICAL DATA: Constipation.  Abdominal pain.

EXAM:
CT ABDOMEN AND PELVIS WITHOUT CONTRAST
TECHNIQUE: Multidetector CT imaging of the abdomen and pelvis was performed
following the standard protocol without IV contrast.

[Series 3: a/p w/o 5mm (person_name) · axial · non-contrast · 0.71mm/px · z∈[+716,+1106]mm · 13 of 86 slices shown, 15 images]
[im 4/86  soft-tissue]
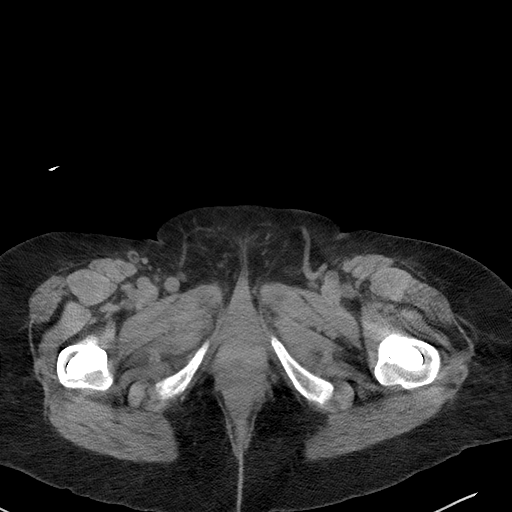
[im 4/86  bone]
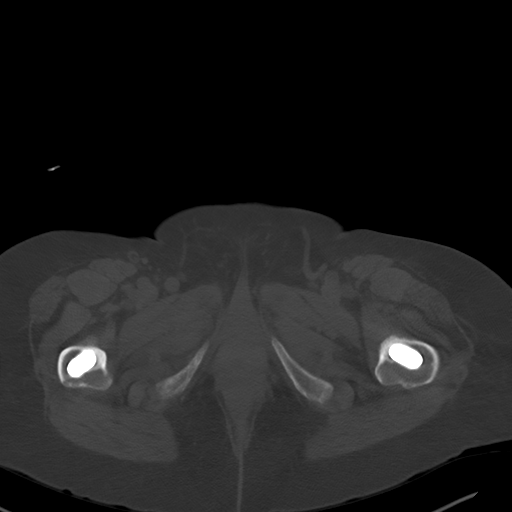
[im 11/86  soft-tissue]
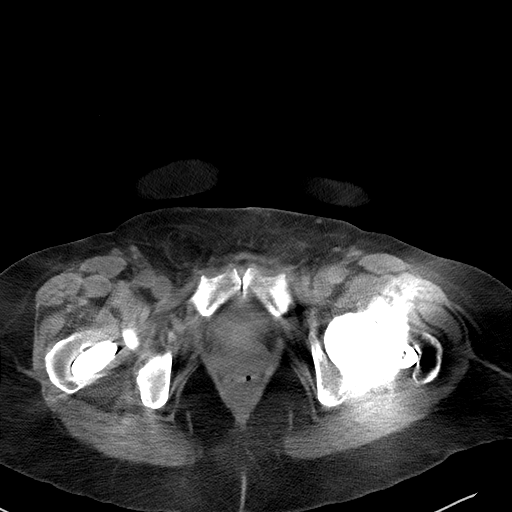
[im 18/86  soft-tissue]
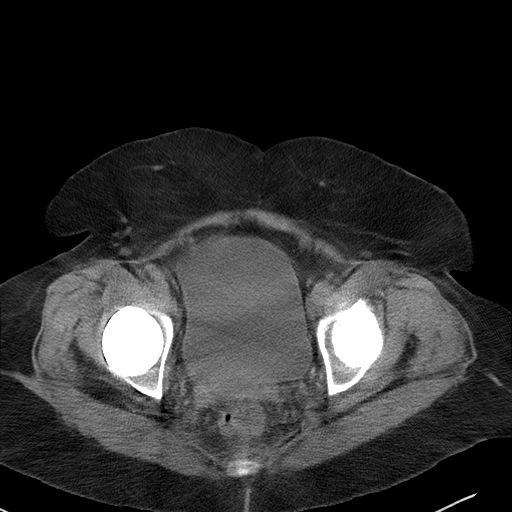
[im 24/86  soft-tissue]
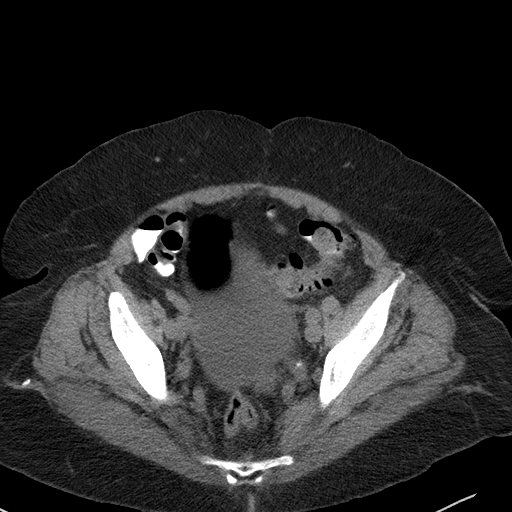
[im 31/86  soft-tissue]
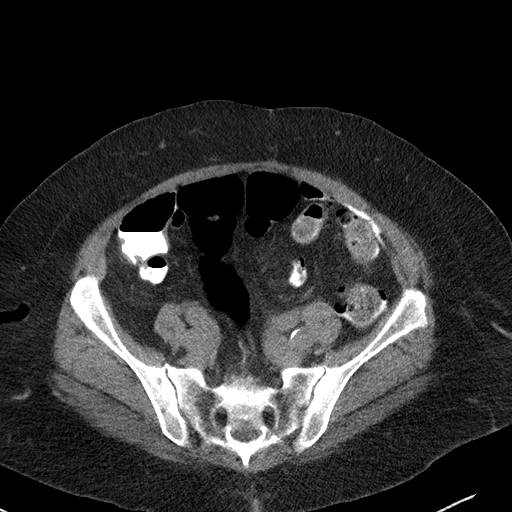
[im 38/86  soft-tissue]
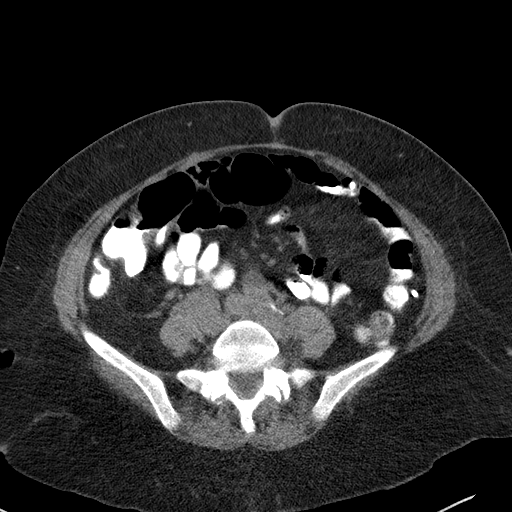
[im 45/86  soft-tissue]
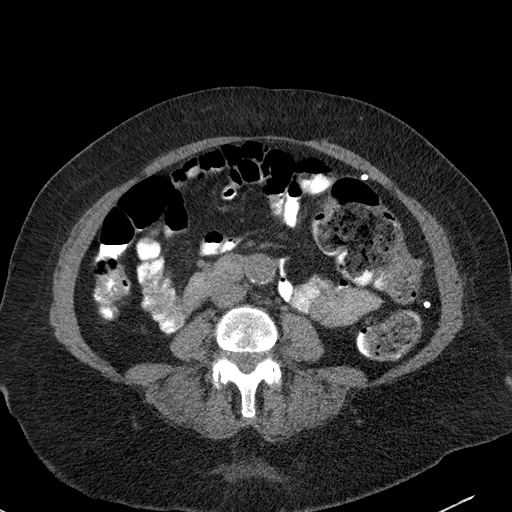
[im 48/86  soft-tissue]
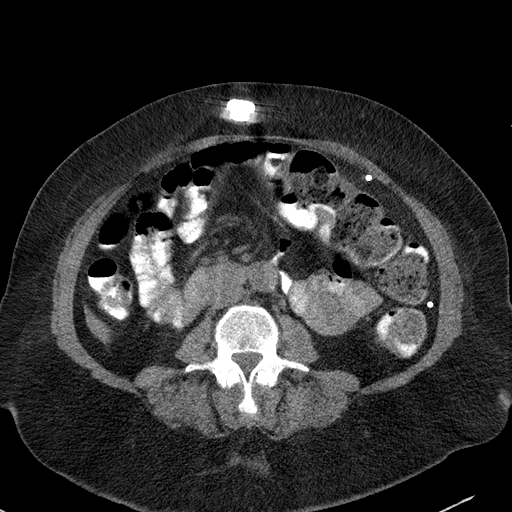
[im 55/86  soft-tissue]
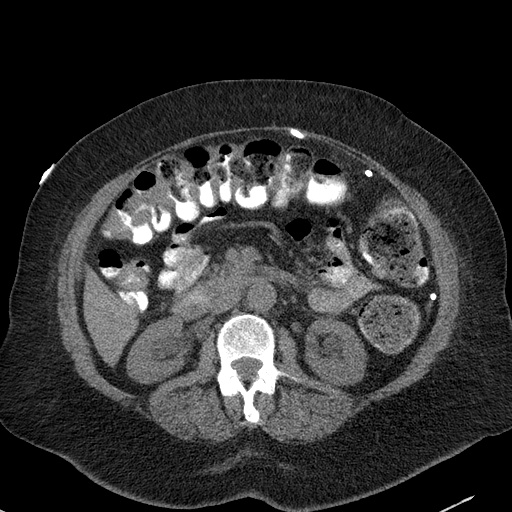
[im 55/86  bone]
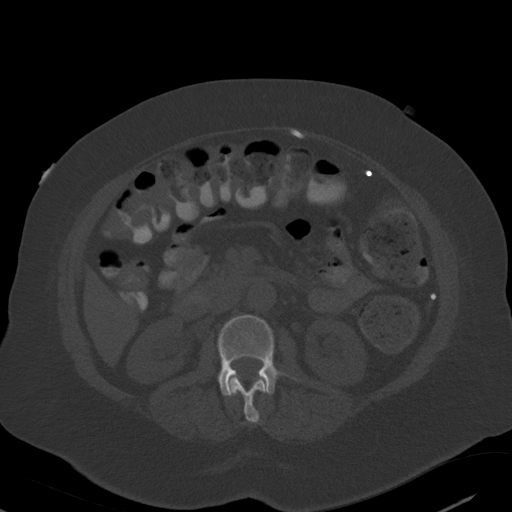
[im 62/86  soft-tissue]
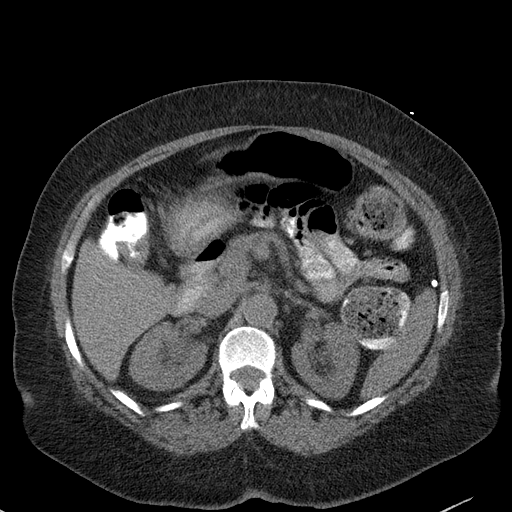
[im 69/86  soft-tissue]
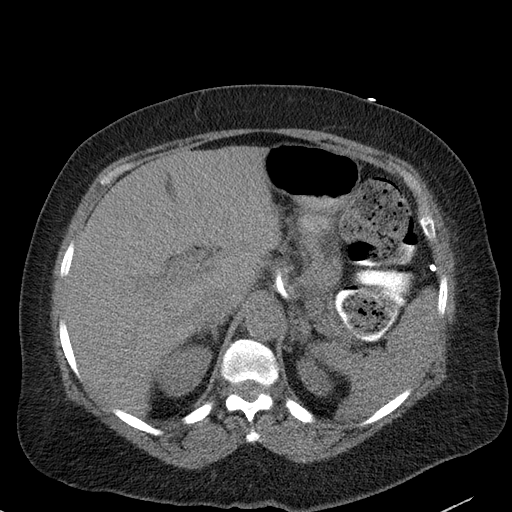
[im 75/86  soft-tissue]
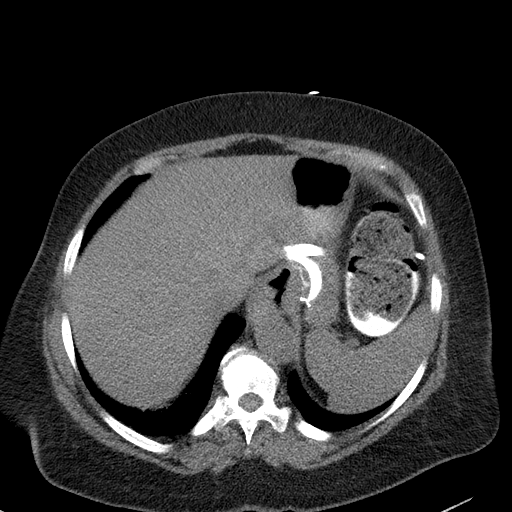
[im 82/86  soft-tissue]
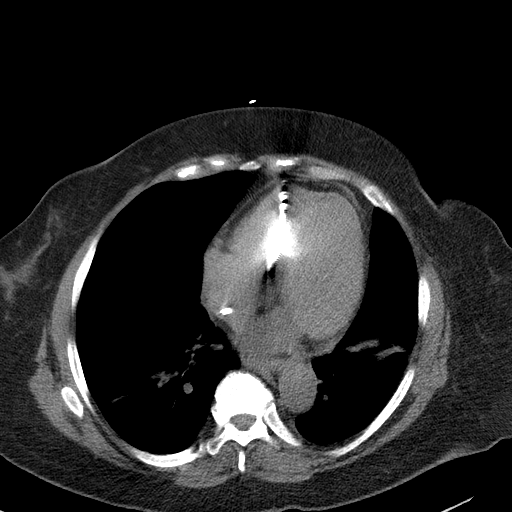

[Series 6: a/p w/o cor · coronal · non-contrast · 0.65mm/px · 3 of 140 slices shown]
[im 47/140  soft-tissue]
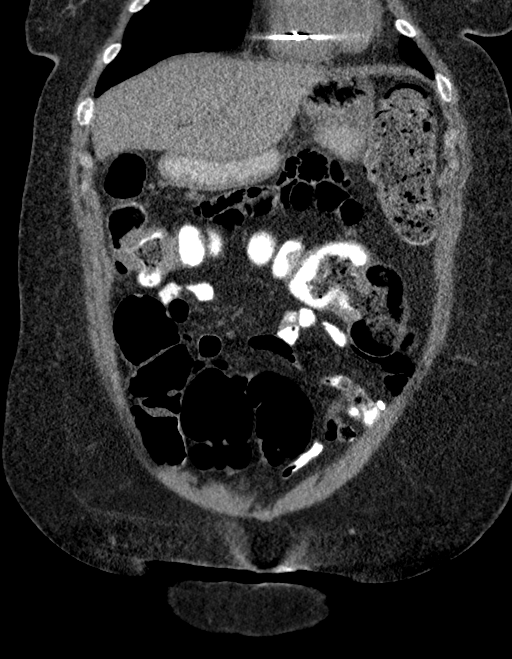
[im 62/140  soft-tissue]
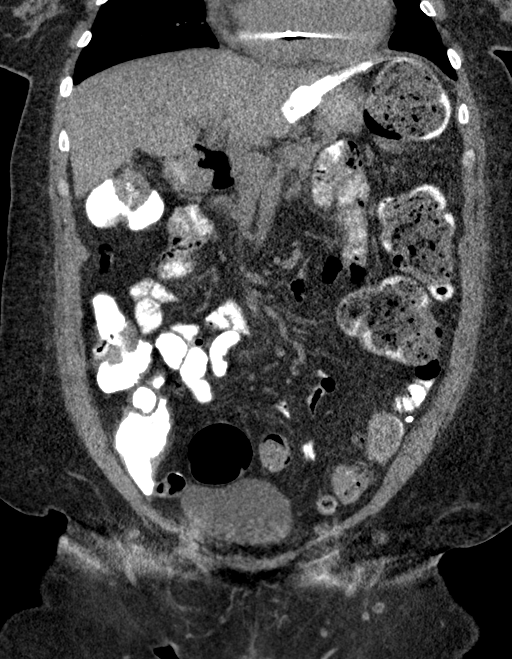
[im 78/140  soft-tissue]
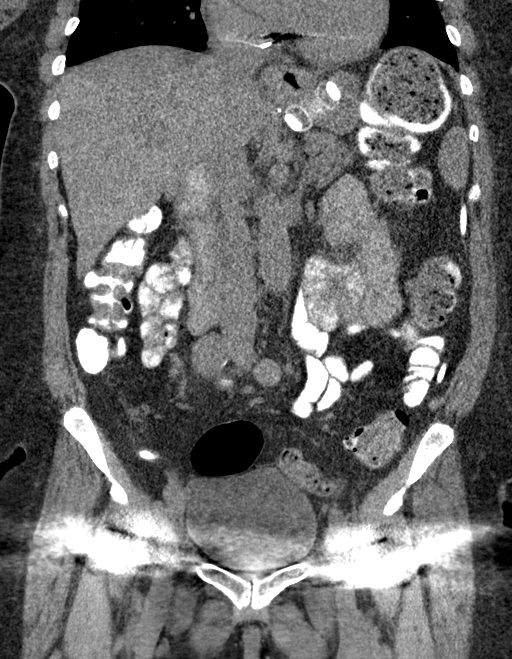

[16 of 46 positions shown; findings below may reference images not displayed]

FINDINGS: Lower chest: Linear atelectasis or scarring in the lung bases. Heart
is borderline in size. AICD wire noted in the right ventricle. No
effusions.

Hepatobiliary: Prior cholecystectomy. 19 mm low-density lesion in
the right hepatic dome, likely cyst.

Pancreas: No focal abnormality or ductal dilatation.

Spleen: No focal abnormality.  Normal size.

Adrenals/Urinary Tract: Mild fullness of the right renal collecting
system and ureter. No visible stones in the kidneys or ureters
bilaterally. Adrenal glands and urinary bladder unremarkable.

Stomach/Bowel: Sigmoid diverticulosis. Large stool burden in the
colon, particularly transverse colon and descending colon. No
evidence of bowel obstruction. Prior gastric band placement.
********* sec as strict band device in place.

Vascular/Lymphatic: Appears to be aneurysmal dilatation of the right
common iliac artery measuring 2.6 cm. No evidence of aortic
aneurysm. No adenopathy.

Reproductive: Prior hysterectomy.  No adnexal masses.

Other: No free fluid or free air.

Musculoskeletal: No acute bony abnormality.
IMPRESSION: Large stool burden throughout the colon compatible constipation.
Sigmoid diverticulosis. No active diverticulitis.

Gastric band device in place.

Mild fullness of the right renal collecting system and ureter. This
may be due to right common iliac artery aneurysm which measures
approximately 2.6 cm. No visible ureteral stones.

Prior cholecystectomy and hysterectomy.

## 2018-06-07 ENCOUNTER — Other Ambulatory Visit: Payer: Self-pay

## 2018-06-07 ENCOUNTER — Encounter: Payer: Self-pay | Admitting: Cardiovascular Disease

## 2018-06-07 ENCOUNTER — Ambulatory Visit (INDEPENDENT_AMBULATORY_CARE_PROVIDER_SITE_OTHER): Payer: Medicare Other | Admitting: Cardiovascular Disease

## 2018-06-07 VITALS — BP 130/74 | HR 67 | Ht 63.5 in | Wt 207.1 lb

## 2018-06-07 DIAGNOSIS — Z79899 Other long term (current) drug therapy: Secondary | ICD-10-CM

## 2018-06-07 DIAGNOSIS — I428 Other cardiomyopathies: Secondary | ICD-10-CM | POA: Diagnosis not present

## 2018-06-07 DIAGNOSIS — I5022 Chronic systolic (congestive) heart failure: Secondary | ICD-10-CM | POA: Diagnosis not present

## 2018-06-07 LAB — HEPATIC FUNCTION PANEL
ALK PHOS: 126 IU/L — AB (ref 39–117)
ALT: 6 IU/L (ref 0–32)
AST: 11 IU/L (ref 0–40)
Albumin: 3.3 g/dL — ABNORMAL LOW (ref 3.6–4.8)
BILIRUBIN TOTAL: 0.3 mg/dL (ref 0.0–1.2)
BILIRUBIN, DIRECT: 0.1 mg/dL (ref 0.00–0.40)
Total Protein: 6.7 g/dL (ref 6.0–8.5)

## 2018-06-07 LAB — LIPID PANEL
CHOL/HDL RATIO: 2.4 ratio (ref 0.0–4.4)
Cholesterol, Total: 106 mg/dL (ref 100–199)
HDL: 44 mg/dL (ref >39)
LDL Calculated: 51 mg/dL (ref 0–99)
TRIGLYCERIDES: 54 mg/dL (ref 0–149)
VLDL Cholesterol Cal: 11 mg/dL (ref 5–40)

## 2018-06-07 LAB — CUP PACEART REMOTE DEVICE CHECK
Battery Remaining Longevity: 65 mo
Battery Remaining Percentage: 62 %
Battery Voltage: 2.95 V
Brady Statistic RV Percent Paced: 1 %
Date Time Interrogation Session: 20190910080016
HIGH POWER IMPEDANCE MEASURED VALUE: 72 Ohm
HighPow Impedance: 72 Ohm
Implantable Lead Implant Date: 20141128
Implantable Lead Model: 7122
Lead Channel Impedance Value: 480 Ohm
Lead Channel Pacing Threshold Amplitude: 0.75 V
Lead Channel Pacing Threshold Pulse Width: 0.5 ms
Lead Channel Sensing Intrinsic Amplitude: 10.2 mV
Lead Channel Setting Pacing Amplitude: 2.5 V
Lead Channel Setting Sensing Sensitivity: 0.5 mV
MDC IDC LEAD LOCATION: 753860
MDC IDC PG IMPLANT DT: 20141128
MDC IDC SET LEADCHNL RV PACING PULSEWIDTH: 0.5 ms
Pulse Gen Serial Number: 7136396

## 2018-06-07 MED ORDER — CARVEDILOL 25 MG PO TABS: 25.0000 mg | ORAL_TABLET | Freq: Two times a day (BID) | ORAL | 3 refills | Status: DC

## 2018-06-07 MED FILL — CARVEDILOL 25 MG TABLET: 25 | 90 days supply | Qty: 180 | Fill #0

## 2018-06-07 NOTE — Patient Instructions (Addendum)
Medication Instructions:  Your physician has recommended you make the following change in your medication: 1. INCREASE Carvedilol to 25 mg twice a day  * If you need a refill on your cardiac medications before your next appointment, please call your pharmacy.   Labwork: Today: Lipid, LFTs *We will only notify you of abnormal results, otherwise continue current treatment plan.  Testing/Procedures: None ordered  Follow-Up: Your physician wants you to follow-up in: 6 months with an PA/NP on Dr. Harvie Bridge care team. Lori Beard will receive a reminder letter in the mail two months in advance. If you don't receive a letter, please call our office to schedule the follow-up appointment.  Your physician wants you to follow-up in: 1 year with Dr. Elease Hashimoto.  You will receive a reminder letter in the mail two months in advance. If you don't receive a letter, please call our office to schedule the follow-up appointment.   Thank you for choosing [facility]!!

## 2018-06-07 NOTE — Progress Notes (Signed)
Cardiology Office Note   Date:  06/07/2018   ID:  Lori Beard, Lori Beard [birth year], MRN [id number]  PCP:  Aurea Graff.Marlou Sa, MD  Cardiologist:   Mertie Moores, MD   Chief Complaint  Patient presents with  . Cardiomyopathy        Lori Beard is a 67 y.o. female who presents for   1. Chronic systolic congestive heart failure 2. Hypertension 3 .obesty - s/p lap band surgery 4. coronary artery disease-status post non-ST segment elevation myocardial infarction complicated by ventricular tachycardia 5. Status post ICD placement 08/04/13   History of Present Illness: Notes from [place] - 09/04/14:  Lori Beard is a 67 y.o. female with a hx of HTN, obesity s/p LAP-BAND surgery and sleep apnea. She was admitted 07/2013 with pneumonia. She ruled in for a NSTEMI. Her hospitalization was complicated by monomorphic VTach that progressed to VFib due to R on T PVC in the setting of hypokalemia with a K of 2.9. Echocardiogram (08/01/13): Mild LVH, global HK, EF 40-45%, Gr 1 DD, MAC, mild RVE, mildly reduced RVSF, PASP 31-35. LHC (08/02/13): Normal coronary arteries, EF 35%, LVEDP 17. She was seen by EP and underwent ICD implantation for secondary prevention of malignant ventricular arrhythmias in the setting of nonischemic cardiomyopathy. She underwent St. [name] single-chamber defibrillator implantation by Dr. Lovena Le 08/04/13.  I saw her 08/11/13. She had resumed verapamil and metoprolol due to high blood pressures. I stopped these medications and placed on losartan and increased her BiDil to 3x per day.   Doing well since last seen. The patient denies chest pain, shortness of breath, syncope, orthopnea, PND or significant pedal edema. No ICD shocks.   Jan. 30, 2015:  Lori Beard presents today for followup evaluation of her hypertension and chronic systolic congestive heart failure. Her left ventricular ejection fraction is approximate 35%. She has a history of a cardiac  arrest and has had an ICD placed. She sees Dr. Cristopher Peru for management of that.  12/27/2013:  Doing well  Oct. 26, 2015:  She is only taking the BiDil twice a day. He causes profound weakness and dizziness. She was originally written to take it 3 times a day but she's not able to take it twice a day.   . Even still, he causes her to be very weak and she has to lie down after taking it for the next 30 minutes to one hour. Otherwise she's doing well. He's not having any profound shortness of breath. No chest pain  Jan. 26, 2016:  Doing well.  No cp or dyspnea.   Is on hydralazine and ISDN instead of BiDIl now.    Jan. 19, 2017:  Lori Beard is doing well. No CP or dyspnea.  Has CAD - hx of MI Has CHF and and ICD .   June 07, 2018: Lori Beard is seen today for follow-up visit of her coronary artery disease and Chronic systolic congestive heart failure.  Last echocardiogram was May 2019 which reveals left ventricular ejection fraction of 50%. She has mild pulmonary artery hypertension with an estimated PA pressure of 38 mmHg.  Had a "hot sensation" yesterday  Had numbness in her left arm and left fingers.  Not associated with any chest pain .  Went to ER.  She was started on gabapentin which seems to have helped.  Walks frequently .   No CP or dyspnea.     Past Medical History:  Diagnosis Date  . Aneurysm of right iliac  artery ([place])   . Arthritis    ALL OVER  . Cardiac arrest ([place]) 08/01/2013   Polymorphic VT  . Chronic systolic CHF (congestive heart failure) ([place])   . GERD (gastroesophageal reflux disease)   . Hypertension   . Morbid obesity ([place])    a. s/p lapband surgery 11/2012.  [name] Myocardial infarction ([place]) 08/06/13  . NICM (nonischemic cardiomyopathy) ([place])    Echocardiogram (08/01/13): Mild LVH, global HK, EF 40-45%, Gr 1 DD, MAC, mild RVE, mildly reduced RVSF, PASP 31-35.  LHC (08/02/13):  Normal coronary arteries, EF 35%, LVEDP 17.  . Pneumonia 08/01/2013  . Renal  failure, acute ([place]) 08/01/2013  . S/P implantation of automatic cardioverter/defibrillator (AICD)    07/2013 Lovena Le)  . Sleep apnea 02/15/2012   SLEEP STUDY IN EPIC - MILD OSA-CPAP NOT RECOMMENDED-HOME OXYGEN SUGGESTED BECAUSE OF OXYGEN DESATS ON ROOM AIR.  [name] Ventricular tachycardia (HCC)    a. R on T PVC during admission for pneumonia/NSTEMI => monomorphic VT=>VF=>defib; s/p ICD    Past Surgical History:  Procedure Laterality Date  . ABDOMINAL HYSTERECTOMY    . BREATH TEK H PYLORI  02/15/2012   Procedure: BREATH TEK H PYLORI;  Surgeon: Pedro Earls, MD;  Location: [facility];  Service: General;  Laterality: N/A;  745  . CARDIAC CATHETERIZATION  08/02/2013   Normal coronary arteries, EF 35%   . CHOLECYSTECTOMY    . IMPLANTABLE CARDIOVERTER DEFIBRILLATOR IMPLANT N/A 08/04/2013   Procedure: IMPLANTABLE CARDIOVERTER DEFIBRILLATOR IMPLANT;  Surgeon: Evans Lance, MD;  Location: [facility] CATH LAB;  Service: Cardiovascular;  Laterality: N/A;  . JOINT REPLACEMENT     BILATERAL TOTAL HIP REPLACEMENTS  . KNEE ARTHROSCOPY    . LAPAROSCOPIC GASTRIC BANDING    . LEFT HEART CATHETERIZATION WITH CORONARY ANGIOGRAM N/A 08/02/2013   Procedure: LEFT HEART CATHETERIZATION WITH CORONARY ANGIOGRAM;  Surgeon: Wellington Hampshire, MD;  Location: [facility] CATH LAB;  Service: Cardiovascular;  Laterality: N/A;  . MESH APPLIED TO LAP PORT N/A 11/22/2012   Procedure: MESH APPLIED TO LAP PORT;  Surgeon: Pedro Earls, MD;  Location: [facility] ORS;  Service: General;  Laterality: N/A;  . PACEMAKER INSERTION   08/04/2013   St. [name] single chamber ICD     Current Outpatient Medications  Medication Sig Dispense Refill  . amitriptyline (ELAVIL) 25 MG tablet Take 75 mg by mouth at bedtime.    [name] aspirin 81 MG tablet Take 1 tablet (81 mg total) by mouth every evening.    [name] atorvastatin (LIPITOR) 10 MG tablet TAKE 1 TABLET BY MOUTH ONCE DAILY AT 6 PM 15 tablet 0  . cetirizine (ZYRTEC) 5 MG tablet Take 1 tablet (5 mg total) by  mouth daily. (Patient taking differently: Take 5 mg by mouth as needed. ) 14 tablet 1  . furosemide (LASIX) 20 MG tablet Take 1 tablet (20 mg total) by mouth every morning. 180 tablet 3  . gabapentin (NEURONTIN) 300 MG capsule Take 1 capsule (300 mg total) by mouth 3 (three) times daily as needed for up to 10 days (Nerve pain and tingling). 30 capsule 0  . hydrocerin (EUCERIN) CREA Apply 1 application topically 2 (two) times daily. 454 g 0  . HYDROcodone-acetaminophen (NORCO) 7.5-325 MG tablet Take 1 tablet by mouth every 8 (eight) hours as needed for moderate pain or severe pain.   0  . losartan (COZAAR) 50 MG tablet Take 1 tablet (50 mg total) by mouth daily. 30 tablet 2  . potassium chloride SA (K-DUR,KLOR-CON) 20 MEQ  tablet Take 1 tablet (20 mEq total) by mouth daily. 90 tablet 3  . triamcinolone ointment (KENALOG) 0.5 % Apply 1 application topically 2 (two) times daily. 30 g 0  . carvedilol (COREG) 25 MG tablet Take 1 tablet (25 mg total) by mouth 2 (two) times daily. 180 tablet 3   No current facility-administered medications for this visit.     Allergies:   Ace inhibitors; Iron; and Morphine and related    Social History:  The patient  reports that she has never smoked. She has never used smokeless tobacco. She reports that she does not drink alcohol or use drugs.   Family History:  The patient's family history includes Breast cancer in her sister; Colon cancer in her father; Heart disease in her father; Hypertension in her father and paternal grandmother; Kidney disease in her mother; Stroke in her paternal grandmother.    ROS:  Please see the history of present illness.   Otherwise, review of systems are positive for none.   All other systems are reviewed and negative.   Physical Exam: Blood pressure 130/74, pulse 67, height 5' 3.5" (1.613 m), weight 207 lb 1.9 oz (93.9 kg), SpO2 92 %.  GEN:  Well nourished, well developed in no acute distress HEENT: Normal NECK: No JVD; No  carotid bruits LYMPHATICS: No lymphadenopathy CARDIAC: RRR , no murmurs, rubs, gallops RESPIRATORY:  Clear to auscultation without rales, wheezing or rhonchi  ABDOMEN: Soft, non-tender, non-distended MUSCULOSKELETAL:  No edema; No deformity  SKIN: Warm and dry NEUROLOGIC:  Alert and oriented x 3   EKG:    Recent Labs: 01/21/2018: ALT 9; Hemoglobin 9.8; Platelets 212 05/23/2018: BUN 17; Creatinine, Ser 1.52; Potassium 4.2; Sodium 142    Lipid Panel    Component Value Date/Time   CHOL 118 (L) 09/26/2015 1558   TRIG 85 09/26/2015 1558   HDL 40 (L) 09/26/2015 1558   CHOLHDL 3.0 09/26/2015 1558   VLDL 17 09/26/2015 1558   LDLCALC 61 09/26/2015 1558      Wt Readings from Last 3 Encounters:  06/07/18 207 lb 1.9 oz (93.9 kg)  05/24/18 209 lb (94.8 kg)  04/08/18 208 lb (94.3 kg)      Other studies Reviewed: Additional studies/ records that were reviewed today include: . Review of the above records demonstrates:    ASSESSMENT AND PLAN:  1. Chronic systolic congestive heart failure -stay on the current dose of losartan and carvedilol. Previously been on isosorbide but she did not tolerate this because of dizziness. Continue with same medications.  She will see an APP in 6 months.  I will plan on seeing her in 1 year.  2. Hypertension-pressure has been well controlled.  3 .obesty -   Stable   4. coronary artery disease-status post non-ST segment elevation myocardial infarction complicated by ventricular tachycardia -   Having any current episodes of angina.  Will check lipid levels today.  5. Status post ICD placement 08/04/13 - - follow up with electrophysiology team.   Current medicines are reviewed at length with the patient today.  The patient does not have concerns regarding medicines.  The following changes have been made:  no change  Labs/ tests ordered today include: BMP     Orders Placed This Encounter  Procedures  . Lipid Profile  . Hepatic function  panel    Disposition:   FU with APP  in 6 months.   Will see me in 1 year   Signed, Mertie Moores, MD  06/07/2018  11:05 AM    [address], [place], [place]  [address] Phone: [phone]; Fax: [phone]

## 2018-06-10 ENCOUNTER — Other Ambulatory Visit: Payer: Self-pay | Admitting: Physician Assistant

## 2018-06-10 MED FILL — AMITRIPTYLINE HCL 25 MG TAB: 25 | 30 days supply | Qty: 90 | Fill #3

## 2018-06-10 MED FILL — ATORVASTATIN 10 MG TABLET: 10 | 90 days supply | Qty: 90 | Fill #0

## 2018-06-11 DIAGNOSIS — Z23 Encounter for immunization: Secondary | ICD-10-CM | POA: Diagnosis not present

## 2018-07-12 MED FILL — HYDROCODON-APAP 7.5-325: 7.5-325 | 23 days supply | Qty: 90 | Fill #0

## 2018-07-13 MED FILL — AMITRIPTYLINE HCL 25 MG TAB: 25 | 90 days supply | Qty: 270 | Fill #0

## 2018-08-11 MED FILL — LOSARTAN POTASSIUM 50 MG TA: 50 | 90 days supply | Qty: 90 | Fill #1

## 2018-08-15 DIAGNOSIS — E7800 Pure hypercholesterolemia, unspecified: Secondary | ICD-10-CM | POA: Diagnosis not present

## 2018-08-15 DIAGNOSIS — G4700 Insomnia, unspecified: Secondary | ICD-10-CM | POA: Diagnosis not present

## 2018-08-15 DIAGNOSIS — N183 Chronic kidney disease, stage 3 (moderate): Secondary | ICD-10-CM | POA: Diagnosis not present

## 2018-08-15 DIAGNOSIS — M150 Primary generalized (osteo)arthritis: Secondary | ICD-10-CM | POA: Diagnosis not present

## 2018-08-15 DIAGNOSIS — K219 Gastro-esophageal reflux disease without esophagitis: Secondary | ICD-10-CM | POA: Diagnosis not present

## 2018-08-15 DIAGNOSIS — M25512 Pain in left shoulder: Secondary | ICD-10-CM | POA: Diagnosis not present

## 2018-08-15 DIAGNOSIS — E669 Obesity, unspecified: Secondary | ICD-10-CM | POA: Diagnosis not present

## 2018-08-15 DIAGNOSIS — I10 Essential (primary) hypertension: Secondary | ICD-10-CM | POA: Diagnosis not present

## 2018-08-16 ENCOUNTER — Ambulatory Visit (INDEPENDENT_AMBULATORY_CARE_PROVIDER_SITE_OTHER): Payer: Medicare Other

## 2018-08-16 DIAGNOSIS — I428 Other cardiomyopathies: Secondary | ICD-10-CM | POA: Diagnosis not present

## 2018-08-16 DIAGNOSIS — I5022 Chronic systolic (congestive) heart failure: Secondary | ICD-10-CM

## 2018-08-18 NOTE — Progress Notes (Signed)
Remote ICD transmission.   

## 2018-08-20 DIAGNOSIS — M25512 Pain in left shoulder: Secondary | ICD-10-CM | POA: Diagnosis not present

## 2018-08-20 DIAGNOSIS — M25511 Pain in right shoulder: Secondary | ICD-10-CM | POA: Diagnosis not present

## 2018-08-23 DIAGNOSIS — J069 Acute upper respiratory infection, unspecified: Secondary | ICD-10-CM | POA: Diagnosis not present

## 2018-08-23 MED FILL — PROMETHAZINE W/COD SYRUP: 6.25-10 | 4 days supply | Qty: 120 | Fill #0

## 2018-09-02 MED FILL — BENZONATATE 200 MG CAPS: 200 | 5 days supply | Qty: 15 | Fill #0

## 2018-09-02 MED FILL — DOXYCYCLINE HYC 100 MG CAPS: 100 | 10 days supply | Qty: 20 | Fill #0

## 2018-09-04 ENCOUNTER — Emergency Department (HOSPITAL_COMMUNITY)
Admission: EM | Admit: 2018-09-04 | Discharge: 2018-09-04 | Disposition: A | Discharge status: Home / Self Care | Payer: Medicare Other | Attending: Emergency Medicine | Admitting: Emergency Medicine

## 2018-09-04 ENCOUNTER — Encounter (HOSPITAL_COMMUNITY): Payer: Self-pay | Admitting: Emergency Medicine

## 2018-09-04 DIAGNOSIS — H538 Other visual disturbances: Secondary | ICD-10-CM | POA: Diagnosis present

## 2018-09-04 DIAGNOSIS — G43109 Migraine with aura, not intractable, without status migrainosus: Secondary | ICD-10-CM | POA: Diagnosis not present

## 2018-09-04 DIAGNOSIS — G43909 Migraine, unspecified, not intractable, without status migrainosus: Secondary | ICD-10-CM | POA: Diagnosis not present

## 2018-09-04 LAB — CBG MONITORING, ED: Glucose-Capillary: 104 mg/dL — ABNORMAL HIGH (ref 70–99)

## 2018-09-04 NOTE — ED Triage Notes (Signed)
Pt reports week or so ago one day saw "fish nets" that lasted few minutes then went away. Reports couple days later saw fish nets again that last few minutes and went away. Today reports saw a bunch of circles that lasted a few minutes then went away. Denies headache or pains.

## 2018-09-04 NOTE — ED Provider Notes (Signed)
[facility] DEPT Provider Note   CSN: [id number] Arrival date & time: 09/04/18  8119     History   Chief Complaint Chief Complaint  Patient presents with  . Visual Field Change    HPI Lori Beard is a 67 y.o. female.  HPI   Presents with intermittent visual changes, has had 4 episodes over the last week with most recnet this AM, will last less than 75mn then resolve. Reports looked like nets in the whole vision in both eyes, not one side or the other. Reports slight headache now. Doesn't always have headache with episodes however. No trouble talking or walking, no numbness or weakness, no fevers. No eye pain.  Reports can see through the nets. Had episode this AM which looked like nets then had red, green and yellow circles moving around.   Never had anything like this before.  Does report history of migraines, with migraines has visual changes where if she looks at something and pieces missing, as well as migraines where she will have weakness. Will also have zigzag lines with migraines but has never had anything like this.  No hx of CVA. No hx of eye problems, wears glasses to read but no other eye problems.   Got cortizone shots, thought maybe it was that  Intermittent visual changes, like nets in vision. This morning got up and just saw red and green circles  Headache now 3-4/10, dull pain, worse with bright lights or loud sounds.   Past Medical History:  Diagnosis Date  . Aneurysm of right iliac artery (HCC)   . Arthritis    ALL OVER  . Cardiac arrest ([facility] 08/01/2013   Polymorphic VT  . Chronic systolic CHF (congestive heart failure) ([facility]   . GERD (gastroesophageal reflux disease)   . Hypertension   . Morbid obesity ([facility]    a. s/p lapband surgery 11/2012.  .Marland KitchenMyocardial infarction ([facility] 08/06/13  . NICM (nonischemic cardiomyopathy) ([facility]    Echocardiogram (08/01/13): Mild LVH, global HK, EF 40-45%, Gr 1 DD, MAC, mild RVE,  mildly reduced RVSF, PASP 31-35.  LHC (08/02/13):  Normal coronary arteries, EF 35%, LVEDP 17.  . Pneumonia 08/01/2013  . Renal failure, acute ([facility] 08/01/2013  . S/P implantation of automatic cardioverter/defibrillator (AICD)    07/2013 (Lovena Le  . Sleep apnea 02/15/2012   SLEEP STUDY IN EPIC - MILD OSA-CPAP NOT RECOMMENDED-HOME OXYGEN SUGGESTED BECAUSE OF OXYGEN DESATS ON ROOM AIR.  .Marland KitchenVentricular tachycardia (HCC)    a. R on T PVC during admission for pneumonia/NSTEMI => monomorphic VT=>VF=>defib; s/p ICD    Patient Active Problem List   Diagnosis Date Noted  . AKI (acute kidney injury) ([facility] 01/22/2018  . Right iliac artery stenosis ([facility] 01/22/2018  . Constipation 01/22/2018  . Weight loss 01/07/2018  . Morbid obesity ([facility]   . Hypertension   . GERD (gastroesophageal reflux disease)   . Chronic systolic CHF (congestive heart failure) ([facility]   . S/P implantation of automatic cardioverter/defibrillator (AICD)   . History of MI (myocardial infarction) 08/06/2013  . NICM (nonischemic cardiomyopathy) ([facility] 08/03/2013  . Hypokalemia 08/02/2013  . HTN (hypertension) 08/01/2013  . Anemia 08/01/2013  . Thrombocytopenia ([facility] 08/01/2013  . Lapband APS + hiatus hernia repair March 2014 11/23/2012  . Arthritis-bilateral hip replacements 11/10/2012  . Sleep apnea 02/15/2012  . Obesity 01/22/2012    Past Surgical History:  Procedure Laterality Date  . ABDOMINAL HYSTERECTOMY    . BREATH TEK H PYLORI  02/15/2012  Procedure: BREATH TEK H PYLORI;  Surgeon: Pedro Earls, MD;  Location: [facility];  Service: General;  Laterality: N/A;  745  . CARDIAC CATHETERIZATION  08/02/2013   Normal coronary arteries, EF 35%   . CHOLECYSTECTOMY    . IMPLANTABLE CARDIOVERTER DEFIBRILLATOR IMPLANT N/A 08/04/2013   Procedure: IMPLANTABLE CARDIOVERTER DEFIBRILLATOR IMPLANT;  Surgeon: Evans Lance, MD;  Location: [facility] CATH LAB;  Service: Cardiovascular;  Laterality: N/A;  . JOINT REPLACEMENT      BILATERAL TOTAL HIP REPLACEMENTS  . KNEE ARTHROSCOPY    . LAPAROSCOPIC GASTRIC BANDING    . LEFT HEART CATHETERIZATION WITH CORONARY ANGIOGRAM N/A 08/02/2013   Procedure: LEFT HEART CATHETERIZATION WITH CORONARY ANGIOGRAM;  Surgeon: Wellington Hampshire, MD;  Location: [facility] CATH LAB;  Service: Cardiovascular;  Laterality: N/A;  . MESH APPLIED TO LAP PORT N/A 11/22/2012   Procedure: MESH APPLIED TO LAP PORT;  Surgeon: Pedro Earls, MD;  Location: [facility] ORS;  Service: General;  Laterality: N/A;  . PACEMAKER INSERTION   08/04/2013   St. [name] single chamber ICD     OB History   No obstetric history on file.      Home Medications    Prior to Admission medications   Medication Sig Start Date End Date Taking? Authorizing Provider  amitriptyline (ELAVIL) 25 MG tablet Take 75 mg by mouth at bedtime. 04/12/12   Rise Mu, PA-C  aspirin 81 MG tablet Take 1 tablet (81 mg total) by mouth every evening. 10/06/13   Nahser, Wonda Cheng, MD  atorvastatin (LIPITOR) 10 MG tablet TAKE 1 TABLET BY MOUTH AT BEDTIME 06/10/18   Bhagat, Bhavinkumar, PA  carvedilol (COREG) 25 MG tablet Take 1 tablet (25 mg total) by mouth 2 (two) times daily. 06/07/18 09/05/18  Nahser, Wonda Cheng, MD  cetirizine (ZYRTEC) 5 MG tablet Take 1 tablet (5 mg total) by mouth daily. Patient taking differently: Take 5 mg by mouth as needed.  05/04/17   Ivar Drape D, PA  furosemide (LASIX) 20 MG tablet Take 1 tablet (20 mg total) by mouth every morning. 05/24/18   Imogene Burn, PA-C  gabapentin (NEURONTIN) 300 MG capsule Take 1 capsule (300 mg total) by mouth 3 (three) times daily as needed for up to 10 days (Nerve pain and tingling). 05/25/18 06/07/18  Joy, Shawn C, PA-C  hydrocerin (EUCERIN) CREA Apply 1 application topically 2 (two) times daily. 01/23/18   Samuella Cota, MD  HYDROcodone-acetaminophen (NORCO) 7.5-325 MG tablet Take 1 tablet by mouth every 8 (eight) hours as needed for moderate pain or severe pain.  01/07/16   [provider]  losartan (COZAAR) 50 MG tablet Take 1 tablet (50 mg total) by mouth daily. 01/09/18   Roxan Hockey, MD  potassium chloride SA (K-DUR,KLOR-CON) 20 MEQ tablet Take 1 tablet (20 mEq total) by mouth daily. 02/15/18   Imogene Burn, PA-C  triamcinolone ointment (KENALOG) 0.5 % Apply 1 application topically 2 (two) times daily. 01/18/18   Harrison Mons, PA    Family History Family History  Problem Relation Age of Onset  . Kidney disease Mother   . Heart disease Father   . Hypertension Father   . Colon cancer Father   . Stroke Paternal Grandmother   . Hypertension Paternal Grandmother   . Breast cancer Sister   . Heart attack Neg Hx     Social History Social History   Tobacco Use  . Smoking status: Never Smoker  . Smokeless tobacco: Never Used  Substance  Use Topics  . Alcohol use: No  . Drug use: No     Allergies   Ace inhibitors; Iron; and Morphine and related   Review of Systems Review of Systems  Constitutional: Negative for fever.  HENT: Negative for sore throat.   Eyes: Positive for visual disturbance. Negative for pain.  Respiratory: Negative for cough and shortness of breath.   Cardiovascular: Negative for chest pain.  Gastrointestinal: Negative for abdominal pain.  Genitourinary: Negative for difficulty urinating.  Musculoskeletal: Negative for back pain and neck pain.  Skin: Negative for rash.  Neurological: Positive for headaches. Negative for syncope, facial asymmetry, weakness and numbness.     Physical Exam Updated Vital Signs BP (!) 145/110   Pulse 72   Temp 98.4 F (36.9 C) (Oral)   Resp 17   SpO2 98%   Physical Exam Vitals signs and nursing note reviewed.  Constitutional:      General: She is not in acute distress.    Appearance: She is well-developed. She is not diaphoretic.  HENT:     Head: Normocephalic and atraumatic.  Eyes:     General: No visual field deficit.    Conjunctiva/sclera: Conjunctivae normal.  Neck:      Musculoskeletal: Normal range of motion.  Cardiovascular:     Rate and Rhythm: Normal rate and regular rhythm.     Heart sounds: Normal heart sounds. No murmur. No friction rub. No gallop.   Pulmonary:     Effort: Pulmonary effort is normal. No respiratory distress.     Breath sounds: Normal breath sounds. No wheezing or rales.  Abdominal:     General: There is no distension.     Palpations: Abdomen is soft.     Tenderness: There is no abdominal tenderness. There is no guarding.  Musculoskeletal:        General: No tenderness.  Skin:    General: Skin is warm and dry.     Findings: No erythema or rash.  Neurological:     Mental Status: She is alert and oriented to person, place, and time.     GCS: GCS eye subscore is 4. GCS verbal subscore is 5. GCS motor subscore is 6.     Cranial Nerves: Cranial nerves are intact. No dysarthria or facial asymmetry.     Sensory: Sensation is intact. No sensory deficit.     Motor: Motor function is intact. No weakness or tremor.     Coordination: Coordination is intact. Coordination normal. Finger-Nose-Finger Test normal.      ED Treatments / Results  Labs (all labs ordered are listed, but only abnormal results are displayed) Labs Reviewed  CBG MONITORING, ED - Abnormal; Notable for the following components:      Result Value   Glucose-Capillary 104 (*)    All other components within normal limits    EKG None  Radiology No results found.  Procedures Procedures (including critical care time)  Medications Ordered in ED Medications - No data to display   Initial Impression / Assessment and Plan / ED Course  I have reviewed the triage vital signs and the nursing notes.  Pertinent labs & imaging results that were available during my care of the patient were reviewed by me and considered in my medical decision making (see chart for details).     67yo female with complex history above including history of complex migraines with  history of migraines with numbness/weakness and visual changes, presents with concern for visual changes with bilateral eyes  seeing intermittent nets over the vision and colored circles.  No visual field changes, no double vision, no associated neurologic changes, normal neuro exam and have low suspicion for CVA. Symptoms bilateral and not consistent with amaurosis fugax, CRAO,CRVO, retinal detachment.  Glucose WNL.   Given symptoms most consistent with migraine aura, history of complex migraines, and low suspicion for the other above neurologic abnormalities, migraine aura with and without headache. Today with headache with photophobia and phonophobia similar to migraines.  Recommend close follow up with Neurology.  Final Clinical Impressions(s) / ED Diagnoses   Final diagnoses:  Migraine aura without headache    ED Discharge Orders    None       Gareth Morgan, MD 09/04/18 2234

## 2018-09-15 MED FILL — CARVEDILOL 25 MG TABLET: 25 | 90 days supply | Qty: 180 | Fill #1

## 2018-09-15 MED FILL — ATORVASTATIN 10 MG TABLET: 10 | 90 days supply | Qty: 90 | Fill #1

## 2018-10-01 LAB — CUP PACEART REMOTE DEVICE CHECK
Battery Remaining Percentage: 60 %
Date Time Interrogation Session: 20191210104724
HighPow Impedance: 63 Ohm
HighPow Impedance: 63 Ohm
Implantable Pulse Generator Implant Date: 20141128
Lead Channel Impedance Value: 440 Ohm
Lead Channel Pacing Threshold Pulse Width: 0.5 ms
Lead Channel Setting Pacing Pulse Width: 0.5 ms
MDC IDC LEAD IMPLANT DT: 20141128
MDC IDC LEAD LOCATION: 753860
MDC IDC MSMT BATTERY REMAINING LONGEVITY: 61 mo
MDC IDC MSMT BATTERY VOLTAGE: 2.95 V
MDC IDC MSMT LEADCHNL RV PACING THRESHOLD AMPLITUDE: 0.75 V
MDC IDC MSMT LEADCHNL RV SENSING INTR AMPL: 10.1 mV
MDC IDC SET LEADCHNL RV PACING AMPLITUDE: 2.5 V
MDC IDC SET LEADCHNL RV SENSING SENSITIVITY: 0.5 mV
MDC IDC STAT BRADY RV PERCENT PACED: 1 %
Pulse Gen Serial Number: 7136396

## 2018-10-10 ENCOUNTER — Other Ambulatory Visit: Payer: Self-pay | Admitting: Physician Assistant

## 2018-10-10 MED FILL — TAC CRM/CETAPHIL CR 1:1: 0.1% | 10 days supply | Qty: 120 | Fill #0 | Status: TO

## 2018-10-10 MED FILL — AMITRIPTYLINE HCL 25 MG TAB: 25 | 15 days supply | Qty: 45 | Fill #0 | Status: TO

## 2018-10-10 MED FILL — HYDROCODON-APAP 7.5-325: 7.5-325 | 23 days supply | Qty: 90 | Fill #0

## 2018-11-15 ENCOUNTER — Ambulatory Visit (INDEPENDENT_AMBULATORY_CARE_PROVIDER_SITE_OTHER): Payer: Medicare Other | Admitting: *Deleted

## 2018-11-15 DIAGNOSIS — I428 Other cardiomyopathies: Secondary | ICD-10-CM | POA: Diagnosis not present

## 2018-11-16 LAB — CUP PACEART REMOTE DEVICE CHECK
Battery Remaining Percentage: 58 %
Brady Statistic RV Percent Paced: 1 %
HighPow Impedance: 62 Ohm
HighPow Impedance: 62 Ohm
Implantable Lead Location: 753860
Implantable Lead Model: 7122
Implantable Pulse Generator Implant Date: 20141128
Lead Channel Impedance Value: 440 Ohm
Lead Channel Pacing Threshold Pulse Width: 0.5 ms
Lead Channel Sensing Intrinsic Amplitude: 10 mV
Lead Channel Setting Pacing Amplitude: 2.5 V
Lead Channel Setting Pacing Pulse Width: 0.5 ms
Lead Channel Setting Sensing Sensitivity: 0.5 mV
MDC IDC LEAD IMPLANT DT: 20141128
MDC IDC MSMT BATTERY REMAINING LONGEVITY: 60 mo
MDC IDC MSMT BATTERY VOLTAGE: 2.95 V
MDC IDC MSMT LEADCHNL RV PACING THRESHOLD AMPLITUDE: 0.75 V
MDC IDC SESS DTM: 20200310090015
Pulse Gen Serial Number: 7136396

## 2018-11-23 ENCOUNTER — Encounter: Payer: Self-pay | Admitting: Cardiology

## 2018-11-23 NOTE — Progress Notes (Signed)
Remote ICD transmission.   

## 2018-12-01 ENCOUNTER — Telehealth: Payer: Self-pay | Admitting: Physician Assistant

## 2018-12-01 NOTE — Telephone Encounter (Signed)
   Cardiac Questionnaire:    Since your last visit or hospitalization:    1. Have you been having new or worsening chest pain? NO   2. Have you been having new or worsening shortness of breath? NO 3. Have you been having new or worsening leg swelling, wt gain, or increase in abdominal girth (pants fitting more tightly)? NO   4. Have you had any passing out spells? NO  Reviewed with patient. She has a smart phone and can do a telehealth visit.  Appointment 12/05/2018 is canceled. Will arrange follow up with me via Telehealth in the next 2 weeks. Tereso Newcomer, PA-C    12/01/2018 12:37 PM

## 2018-12-05 ENCOUNTER — Ambulatory Visit: Payer: Medicare Other | Admitting: Physician Assistant

## 2018-12-07 NOTE — Telephone Encounter (Signed)
Lori Beard,  Please add her to my schedule for a virtual visit next week (Mon, Tues or Wed - 4/6, 4/7 or 4/8). Thanks [employer]

## 2018-12-09 ENCOUNTER — Telehealth: Payer: Self-pay | Admitting: Physician Assistant

## 2018-12-09 NOTE — Telephone Encounter (Signed)
°  4/3 :  Called patient regarding appointment on 4/6. Patient did not have MyChart activated. Patient was given steps to activate MyChart. Gave patient time to try to login. Patient was not able to login.  Patient has smart phone. Patient was able to download Webex app. Patient was able to get into practice meeting with me. The patient's camera was dark and was turned the wrong way. I was not successful with patient fixing the camera issue.

## 2018-12-11 NOTE — Progress Notes (Signed)
Virtual Visit via Telephone Note   This visit type was conducted due to national recommendations for restrictions regarding the COVID-19 Pandemic (e.g. social distancing) in an effort to limit this patient's exposure and mitigate transmission in our community.  Due to her co-morbid illnesses, this patient is at least at moderate risk for complications without adequate follow up.  This format is felt to be most appropriate for this patient at this time.  The patient did not have access to video technology/had technical difficulties with video requiring transitioning to audio format only (telephone).  All issues noted in this document were discussed and addressed.  No physical exam could be performed with this format.  Please refer to the patient's chart for her  consent to telehealth for [facility].   Evaluation Performed:  Follow-up visit  Date:  12/12/2018   ID:  Lori Beard, Lori Beard [birth year], MRN [id number]  Patient Location:  Home   Provider location:   Home   PCP:  Alroy Dust, L.Marlou Sa, MD  Cardiologist:  Mertie Moores, MD   Electrophysiologist:  Cristopher Peru, MD   Chief Complaint:  Follow up on CHF  History of Present Illness:    Lori Beard is a 68 y.o. female who presents via audio/video conferencing for a telehealth visit today.    She has combined systolic and diastolic CHF due to non-ischemic cardiomyopathy, polymorphic VT, s/p ICD, hypertension, obstructive sleep apnea, prior Lap-Band surgery.  She had a NSTEMI in 25/8527 that was complicated by polymorphic VT that deteriorated to VF (R on T PVC in the setting of hypokalemia).  She had normal coronary arteries on LHC and underwent ICD implantation at that time.  She was admitted in 5/19 with near syncope and her Carvedilol was reduced, Isosorbide and Hydralazine were stopped.  She was last seen by Dr. Acie Fredrickson in 06/2018.    Today, she is doing well.  She has not had chest discomfort or significant shortness of breath.   She denies syncope, ICD therapies, paroxysmal nocturnal dyspnea.  She does have some lower extremity swelling that improves with elevation.  Her weights have been stable.    The patient does not have symptoms concerning for COVID-19 infection (fever, chills, cough, or new shortness of breath).    Prior CV studies:   The following studies were reviewed today:  Echo 01/08/18 EF 50, apical HK, mild LVH, PASP 38  Echocardiogram (08/01/13):  Mild LVH, global HK, EF 40-45%, Gr 1 DD, MAC, mild RVE, mildly reduced RVSF, PASP 31-35.   LHC (08/02/13):  Normal coronary arteries, EF 35%, LVEDP 17.   Past Medical History:  Diagnosis Date  . Aneurysm of right iliac artery (HCC)   . Arthritis    ALL OVER  . Cardiac arrest ([place]) 08/01/2013   Polymorphic VT  . Chronic systolic CHF (congestive heart failure) ([place])   . GERD (gastroesophageal reflux disease)   . Hypertension   . Morbid obesity ([place])    a. s/p lapband surgery 11/2012.  Lori Beard Myocardial infarction ([place]) 08/06/13  . NICM (nonischemic cardiomyopathy) ([place])    Echocardiogram (08/01/13): Mild LVH, global HK, EF 40-45%, Gr 1 DD, MAC, mild RVE, mildly reduced RVSF, PASP 31-35.  LHC (08/02/13):  Normal coronary arteries, EF 35%, LVEDP 17.  . Pneumonia 08/01/2013  . Renal failure, acute ([place]) 08/01/2013  . S/P implantation of automatic cardioverter/defibrillator (AICD)    07/2013 Lori Beard)  . Sleep apnea 02/15/2012   SLEEP STUDY IN EPIC - MILD OSA-CPAP NOT RECOMMENDED-HOME OXYGEN SUGGESTED  BECAUSE OF OXYGEN DESATS ON ROOM AIR.  Lori Beard Ventricular tachycardia (HCC)    a. R on T PVC during admission for pneumonia/NSTEMI => monomorphic VT=>VF=>defib; s/p ICD   Past Surgical History:  Procedure Laterality Date  . ABDOMINAL HYSTERECTOMY    . BREATH TEK H PYLORI  02/15/2012   Procedure: BREATH TEK H PYLORI;  Surgeon: Pedro Earls, MD;  Location: [facility];  Service: General;  Laterality: N/A;  745  . CARDIAC CATHETERIZATION  08/02/2013    Normal coronary arteries, EF 35%   . CHOLECYSTECTOMY    . IMPLANTABLE CARDIOVERTER DEFIBRILLATOR IMPLANT N/A 08/04/2013   Procedure: IMPLANTABLE CARDIOVERTER DEFIBRILLATOR IMPLANT;  Surgeon: Evans Lance, MD;  Location: [facility] CATH LAB;  Service: Cardiovascular;  Laterality: N/A;  . JOINT REPLACEMENT     BILATERAL TOTAL HIP REPLACEMENTS  . KNEE ARTHROSCOPY    . LAPAROSCOPIC GASTRIC BANDING    . LEFT HEART CATHETERIZATION WITH CORONARY ANGIOGRAM N/A 08/02/2013   Procedure: LEFT HEART CATHETERIZATION WITH CORONARY ANGIOGRAM;  Surgeon: Wellington Hampshire, MD;  Location: [facility] CATH LAB;  Service: Cardiovascular;  Laterality: N/A;  . MESH APPLIED TO LAP PORT N/A 11/22/2012   Procedure: MESH APPLIED TO LAP PORT;  Surgeon: Pedro Earls, MD;  Location: [facility];  Service: General;  Laterality: N/A;  . PACEMAKER INSERTION   08/04/2013   St. [name] single chamber ICD     No outpatient medications have been marked as taking for the 12/12/18 encounter (Telemedicine) with Richardson Dopp T, PA-C.     Allergies:   Ace inhibitors; Iron; and Morphine and related   Social History   Tobacco Use  . Smoking status: Never Smoker  . Smokeless tobacco: Never Used  Substance Use Topics  . Alcohol use: No  . Drug use: No     Family Hx: The patient's family history includes Breast cancer in her sister; Colon cancer in her father; Heart disease in her father; Hypertension in her father and paternal grandmother; Kidney disease in her mother; Stroke in her paternal grandmother. There is no history of Heart attack.  ROS:   Please see the history of present illness.    No melena, hematochezia All other systems reviewed and are negative.   Labs/Other Tests and Data Reviewed:    Recent Labs: 01/21/2018: Hemoglobin 9.8; Platelets 212 05/23/2018: BUN 17; Creatinine, Ser 1.52; Potassium 4.2; Sodium 142 06/07/2018: ALT 6   Recent Lipid Panel Lab Results  Component Value Date/Time   CHOL 106 06/07/2018 10:00 AM   TRIG  54 06/07/2018 10:00 AM   HDL 44 06/07/2018 10:00 AM   CHOLHDL 2.4 06/07/2018 10:00 AM   CHOLHDL 3.0 09/26/2015 03:58 PM   LDLCALC 51 06/07/2018 10:00 AM   From PCP 02/09/18:  Cr 1.43, K 4.2, Hgb 10.3  From KPN Tool:    Wt Readings from Last 3 Encounters:  12/12/18 208 lb (94.3 kg)  06/07/18 207 lb 1.9 oz (93.9 kg)  05/24/18 209 lb (94.8 kg)     Objective:    Vital Signs:  BP (!) 160/89   Pulse 60   Ht 5' 1.5" (1.562 m)   Wt 208 lb (94.3 kg)   BMI 38.66 kg/m    Gen - She does not seem to be in apparent distress Resp - She does not demonstrate labored breathing while talking on the phone Neuro - During our conversation, she seems to be alert and oriented Psych - She seems to be in good spirits  ASSESSMENT & PLAN:  NICM (nonischemic cardiomyopathy) ([place]) EF has improved to 50% by echocardiogram in May 2019.  She seems to be NYHA 2.  Overall, her volume status seems to be stable.  Continue beta-blocker, ARB.  Plan follow-up in 6 months.  Ventricular tachycardia ([place]) She is status post AICD.  No apparent recurrence.  Continue beta-blocker.  Hypertension Blood pressure above target.  She was somewhat frustrated before the phone call.  I have asked her to monitor her blood pressure over the next couple of weeks.  She should contact us if her pressure is >130/80 on a consistent basis.  ICD (implantable cardioverter-defibrillator) in place Follow-up with EP as planned.  Educated About Covid-19 Virus Infection The signs and symptoms of COVID-19 were discussed with the patient and how to seek care for testing (follow up with PCP or arrange E-visit).   The importance of social distancing was discussed today.  Patient Risk:   After full review of this patient's clinical status, I feel that they are at least moderate risk at this time.  Time:   Today, I have spent 8 minutes with the patient with telehealth technology discussing the above problems.     Medication  Adjustments/Labs and Tests Ordered: Current medicines are reviewed at length with the patient today.  Concerns regarding medicines are outlined above.  Tests Ordered: No orders of the defined types were placed in this encounter.  Medication Changes: Meds ordered this encounter  Medications  . carvedilol (COREG) 25 MG tablet    Sig: Take 1 tablet (25 mg total) by mouth 2 (two) times daily with a meal.    Dispense:  180 tablet    Refill:  3  . atorvastatin (LIPITOR) 10 MG tablet    Sig: Take 1 tablet (10 mg total) by mouth at bedtime.    Dispense:  90 tablet    Refill:  3    Disposition:  Follow up in 6 month(s)  Signed, Richardson Dopp, PA-C  12/12/2018 11:32 AM    [facility]

## 2018-12-12 ENCOUNTER — Telehealth (INDEPENDENT_AMBULATORY_CARE_PROVIDER_SITE_OTHER): Payer: Medicare Other | Admitting: Physician Assistant

## 2018-12-12 ENCOUNTER — Other Ambulatory Visit: Payer: Self-pay

## 2018-12-12 VITALS — BP 160/89 | HR 60 | Ht 61.5 in | Wt 208.0 lb

## 2018-12-12 DIAGNOSIS — I428 Other cardiomyopathies: Secondary | ICD-10-CM

## 2018-12-12 DIAGNOSIS — I472 Ventricular tachycardia, unspecified: Secondary | ICD-10-CM

## 2018-12-12 DIAGNOSIS — I10 Essential (primary) hypertension: Secondary | ICD-10-CM

## 2018-12-12 DIAGNOSIS — Z7189 Other specified counseling: Secondary | ICD-10-CM

## 2018-12-12 DIAGNOSIS — Z95810 Presence of automatic (implantable) cardiac defibrillator: Secondary | ICD-10-CM

## 2018-12-12 MED ORDER — ATORVASTATIN CALCIUM 10 MG PO TABS: 10.0000 mg | ORAL_TABLET | Freq: Every day | ORAL | 3 refills | Status: DC

## 2018-12-12 MED ORDER — CARVEDILOL 25 MG PO TABS: 25.0000 mg | ORAL_TABLET | Freq: Two times a day (BID) | ORAL | 3 refills | Status: DC

## 2018-12-12 NOTE — Telephone Encounter (Signed)
   TELEPHONE CALL NOTE  Lori Beard has been deemed a candidate for a follow-up tele-health visit to limit community exposure during the Covid-19 pandemic. I spoke with the patient via phone to ensure availability of phone/video source, confirm preferred email & phone number, and discuss instructions and expectations.  I reminded Lori Beard to be prepared with any vital sign and/or heart rhythm information that could potentially be obtained via home monitoring, at the time of her visit. I reminded Lori Beard to expect a phone call at the time of her visit if her visit.  Did the patient verbally acknowledge consent to treatment? Park Liter, CMA 12/12/2018 9:43 AM

## 2018-12-12 NOTE — Telephone Encounter (Signed)
Please obtain consent for today's visit. Tereso Newcomer, PA-C    12/12/2018 8:23 AM

## 2018-12-12 NOTE — Patient Instructions (Signed)
Medication Instructions:  No changes.   If you need a refill on your cardiac medications before your next appointment, please call your pharmacy.   Lab work: None   If you have labs (blood work) drawn today and your tests are completely normal, you will receive your results only by: [name] MyChart Message (if you have MyChart) OR . A paper copy in the mail If you have any lab test that is abnormal or we need to change your treatment, we will call you to review the results.  Testing/Procedures: None   Follow-Up: At [facility], you and your health needs are our priority.  As part of our continuing mission to provide you with exceptional heart care, we have created designated Provider Care Teams.  These Care Teams include your primary Cardiologist (physician) and Advanced Practice Providers (APPs -  Physician Assistants and Nurse Practitioners) who all work together to provide you with the care you need, when you need it. You will need a follow up appointment in:  6 months.  Please call our office 2 months in advance to schedule this appointment.  You may see Kristeen Miss, MD   or Tereso Newcomer, PA-C   Any Other Special Instructions Will Be Listed Below (If Applicable). Check blood pressure once daily for 2 weeks. If most readings are more than 130/80, call us for further directions.

## 2018-12-15 ENCOUNTER — Telehealth: Payer: Self-pay | Admitting: Cardiovascular Disease

## 2018-12-15 NOTE — Telephone Encounter (Signed)
Left message for patient to call back  

## 2018-12-15 NOTE — Telephone Encounter (Signed)
Waiting for advice from [place], [place] regarding patient's BP therapy

## 2018-12-15 NOTE — Telephone Encounter (Signed)
° ° °  Pt c/o BP issue: STAT if pt c/o blurred vision, one-sided weakness or slurred speech  1. What are your last 5 BP readings? 146/88, 165/90  2. Are you having any other symptoms (ex. Dizziness, headache, blurred vision, passed out)? Ear discomfort  3. What is your BP issue? Patient states BP is too high

## 2018-12-15 NOTE — Telephone Encounter (Signed)
F/U Message          Patient returned the call from [place], she would like a call back

## 2018-12-15 NOTE — Telephone Encounter (Signed)
New Message   Patient states she was half sleep when the doctor explained everything to her so she would like a nurse to call her back to go over everything again about the appointment.

## 2018-12-15 NOTE — Telephone Encounter (Signed)
Spoke with patient who states she is calling back to report high BP readings. She had virtual visit with Tereso Newcomer, PA on Monday and was told to call back to report high readings. I asked about sodium in her diet and she states she eats at home most of the time but lists things such as collard greens, pinto beans, fried chicken, and rice as common meals. I advised her to monitor sodium content and try to keep it under 2000 mg daily. She states the BP readings given to operator are common for her. She admits to also drinking a lot of coffee. I advised her to limit coffee and increase water intake and that I will forward message to Tereso Newcomer for advice. I advised someone from our office will call her back with his advice. She verbalized understanding and agreement and thanked me for the call.

## 2018-12-16 ENCOUNTER — Telehealth: Payer: Self-pay | Admitting: Cardiovascular Disease

## 2018-12-16 MED ORDER — HYDRALAZINE HCL 25 MG PO TABS: 25.0000 mg | ORAL_TABLET | Freq: Three times a day (TID) | ORAL | 3 refills | Status: DC

## 2018-12-16 NOTE — Telephone Encounter (Signed)
Start Hydralazine 25 mg three times a day  Continue all other blood pressure medications at current dosages.  Monitor blood pressure over the next week and call with readings. Tereso Newcomer, PA-C    12/16/2018 12:04 PM

## 2018-12-16 NOTE — Telephone Encounter (Signed)
Please see other phone note from 12/16/2018 for medication adjustments. Tereso Newcomer, PA-C    12/16/2018 12:06 PM

## 2018-12-16 NOTE — Telephone Encounter (Signed)
Pt is still concerned about her elevated BP from yesterday and this morning.   Will send to Wende Mott who had appt with pt on 4/6 for medication advisement.

## 2018-12-16 NOTE — Telephone Encounter (Signed)
Spoke with pt. She agrees to begin hydralazine, 25mg  tab tid. She will monitor her BP for the following week and call back with her findings. She has verbalized understanding and had no additional questions.

## 2018-12-16 NOTE — Telephone Encounter (Signed)
New Message   Pt is calling to leave her BP Readings  Last Night 180/114 waited and took 2 tablets of Carvedilol and then she took her BP again and it was 140/85  This morning it is  163/99  Pt has not yet took more medication  Please call back

## 2018-12-26 ENCOUNTER — Telehealth: Payer: Self-pay | Admitting: *Deleted

## 2018-12-26 ENCOUNTER — Telehealth: Payer: Self-pay | Admitting: Cardiovascular Disease

## 2018-12-26 NOTE — Telephone Encounter (Signed)
Please call the patient to confirm her BP readings. It appears all these readings are from today.  The AM readings are too high and the readings after 2pm look optimal/close to optimal.   The notes say she is taking Hydralazine 25 mg "2 tablets" Make sure she takes Hydralazine 25 mg three times a day. Check BP 1-2 times a day (no more - unless she feels bad) for the next 3 days and call with results or send to me by MyChart message. If she checks her BP and it is 170/100 or higher, rest/relax for 1 hour and recheck it.  If it is still 170/100 or higher, take an extra Hydralazine 25 mg.  If it does not come down to less than 170/100 after an additional hour, call us. Tereso Newcomer, PA-C    12/26/2018 2:59 PM

## 2018-12-26 NOTE — Telephone Encounter (Signed)
SPOKE  WITH PT ABOUT BLOOD PRESSURE READINGS WHO STATED SHE TOOK  HER BP  BACK TO BACK WITH NO  REST TIME IN BETWEEN. PT STATED SHE HAD BEEN TAKING HER BLOOD PRESSURE SINCE LAST THURS BUT DIDN'T RECORD ALL THOSE READINGS.PT WAS DISCUSSED WITH HOW SHE IS TAKING HER ALL HER MEDICINES.   PT STATED  THESE TWO MEDICATIONS CARVEDILOL AND HYDRALAZINE SHE HAS BEEN TAKING (SELF MEDICATE WAY)  1.CARVEDILOL 25 MG TWO TABLETS TWICE A DAY  2.HYDRALAZINE 25 MG TWO TABLETS 3 TIMES A DAY  PT STATED IT WAS HELPING BP GO DOWN DUE TO THE FACT PT KNOWS THAT IS NOT THE CORRECT WAY.  PT WAS EXPLAINED THE IMPORTANCE OF CONTACTING OFFICE FIRST FOR APPROPRIATE DOSE OF MEDICINES.  PT AND I WITH PT WRITING DOWN  STATED APPROPRIATE WAY TO TAKE MEDICATIONS. PT VERBALIZED BACK MEDICATIONS AND CORRECT DOSAGE.  PT WAS INSTRUCTED FOR NEXT 3 DAYS TO TAKE BP 1-2 TIMES A DAY NO MORE UNLESS NOT FEELING WELL  DOCUMENT AND KEEP LOG OF BP TO SEE RANGES OF 170/100'S ARE CHANGING PT  MAY TAKE ONE EXTRA HYDRALAZINE 25 MG IF READINGS ARE NOT CHANGED. SEND INFO TO MYCHART TO KEEP [place] UPDATED FOR FOLLOW UP .

## 2018-12-26 NOTE — Telephone Encounter (Signed)
New Message    Pt c/o BP issue: STAT if pt c/o blurred vision, one-sided weakness or slurred speech  1. What are your last 5 BP readings?  7am   150/ 100   166/95  161/95 2pm     121/81  131/81   136/81   2. Are you having any other symptoms (ex. Dizziness, headache, blurred vision, passed out)? No   3. What is your BP issue? Carvedilol 25mg  2 tablets  Hydralazine 25mg   2 tablets  Pt took those medications, she said he blood pressure came down some    Please call

## 2018-12-29 NOTE — Telephone Encounter (Signed)
Pt c/o BP issue: STAT if pt c/o blurred vision, one-sided weakness or slurred speech  1. What are your last 5 BP readings?   4/21 am 7:30  171/100     Pill 10a 105/68             Pm 10 168/108    Pill 11:59      149/95  4/22  Am 7:30   167/99    Pill 167/93                     Pm    10  158/95      Pill11:59   167/95  4/23  Am 7:30  171/100     Pill 105/68   11:59 am 105/68  2. Are you having any other symptoms (ex. Dizziness, headache, blurred vision, passed out)? Headaches  3. What is your BP issue? HIGH

## 2018-12-29 NOTE — Telephone Encounter (Signed)
Pt reports taking her Hydralazine two times per day.   She takes 2 pills (50mg ) in the morning around 7:30am and 1 pill (25mg )  in the evening around 10:30pm  Pt c/o earache and headache as well.   Routing to [employer] for [employer]

## 2018-12-29 NOTE — Telephone Encounter (Signed)
Let's try increasing the evening dose of Hydralazine to see if it helps. Increase Hydralazine to 50 mg twice daily. Check BP 1-2 times a day over the next week and send readings (remind her she can send it via MyChart if she wants). Make sure she checks her BP AFTER taking her medications.  So, she can check it late morning/early afternoon. Tereso Newcomer, PA-C    12/29/2018 7:22 PM

## 2018-12-29 NOTE — Telephone Encounter (Signed)
Her BP readings are a little confusing. It looks like her BP responds pretty well to medication in the AM and is perfect later in the AM. I'm not sure what is happening at night.  There is no real response to the the medication she is taking. Please find out what medication she is taking at what time and send back to me. Ask if she is having a hard time taking Hydralazine three times a day.  (If so this may be impacting BP control). Let me know what she says.  I may need to change her hydralazine to amlodipine to make it easier on her. Thanks, Tereso Newcomer, PA-C    12/29/2018 3:07 PM

## 2018-12-30 ENCOUNTER — Telehealth: Payer: Self-pay | Admitting: *Deleted

## 2018-12-30 MED ORDER — HYDRALAZINE HCL 25 MG PO TABS: 50.0000 mg | ORAL_TABLET | Freq: Two times a day (BID) | ORAL | 1 refills | Status: DC

## 2018-12-30 NOTE — Telephone Encounter (Signed)
Ok. Tereso Newcomer, PA-C    12/30/2018 1:49 PM

## 2018-12-30 NOTE — Telephone Encounter (Signed)
SPOKE WITH PT AND PT AWARE OF MED CHANGES HYDRALAZINE 50 MG TWICE A DAY AND TAKING BP ONCE TO TWICE A DAY AFTER MEDS IN MID MORNING LATE AFTERNOON  PT DOESN'T HAVE COMPUTER ACESS THAT'S WHY SHE KEEPS LOGS AND CALL IN.[name]  PT INSTRUCTED TO TAKE AN EXTRA TABLET HYDRALAZINE 25 MG  WITH EVENING DOSE WHICH WILL START HER ON NEW DOSE DUE TO  SHE HAS TAKEN 2 TABLETS THIS AM.  PT INSTRUCTED TO WRITE DATE AND TIME ALSO ON LOG  AND PT WILL CALL BACK FRI NEXT WEEK WITH LOG.  MEDS CHANGED N Epic (NO PRINT). PT STATES SHE DIDN'T NEED A REFILL AT THIS TIME.

## 2018-12-30 NOTE — Telephone Encounter (Signed)
Patient called wondering when she was going to get a callback.

## 2019-01-16 ENCOUNTER — Telehealth: Payer: Self-pay | Admitting: Cardiovascular Disease

## 2019-01-16 NOTE — Telephone Encounter (Signed)
BP still too high most of the time. She used to be on Isosorbide.  Let's resume that. Start Isosorbide dinitrate 20 mg twice daily  Please send in a Rx to her pharmacy. Continue to monitor blood pressure. Tereso Newcomer, PA-C    01/16/2019 5:24 PM

## 2019-01-16 NOTE — Telephone Encounter (Signed)
New Message   Pt is calling to leave BP readings    Monday 169/97  Tuesday 162/99 Wednesday  146/87 Thursday 152/103  Friday  133/82 Saturday 126/82  Sunday 125/83 Monday 179/108

## 2019-01-17 MED ORDER — ISOSORBIDE DINITRATE 20 MG PO TABS: 20.0000 mg | ORAL_TABLET | Freq: Two times a day (BID) | ORAL | 1 refills | Status: DC

## 2019-01-17 NOTE — Telephone Encounter (Signed)
SPOKE WITH PT AND PT AWARE OF NEW START MEDICINE ISOSORBIDE 20 MG TWICE A DAY   KEEP A RECORD OF BLOOD PRESSURE FOR A COUPLE OF WEEKS   AND CALL CLINIC BACK WITH READINGS

## 2019-02-14 ENCOUNTER — Telehealth: Payer: Self-pay

## 2019-02-14 ENCOUNTER — Ambulatory Visit (INDEPENDENT_AMBULATORY_CARE_PROVIDER_SITE_OTHER): Payer: Medicare Other | Admitting: *Deleted

## 2019-02-14 DIAGNOSIS — I5022 Chronic systolic (congestive) heart failure: Secondary | ICD-10-CM

## 2019-02-14 DIAGNOSIS — I428 Other cardiomyopathies: Secondary | ICD-10-CM

## 2019-02-14 LAB — CUP PACEART REMOTE DEVICE CHECK
Battery Remaining Longevity: 58 mo
Battery Remaining Percentage: 56 %
Battery Voltage: 2.93 V
Brady Statistic RV Percent Paced: 1 %
Date Time Interrogation Session: 20200609080017
HighPow Impedance: 53 Ohm
HighPow Impedance: 53 Ohm
Implantable Lead Implant Date: 20141128
Implantable Lead Location: 753860
Implantable Lead Model: 7122
Implantable Pulse Generator Implant Date: 20141128
Lead Channel Impedance Value: 390 Ohm
Lead Channel Pacing Threshold Amplitude: 0.75 V
Lead Channel Pacing Threshold Pulse Width: 0.5 ms
Lead Channel Sensing Intrinsic Amplitude: 8.7 mV
Lead Channel Setting Pacing Amplitude: 2.5 V
Lead Channel Setting Pacing Pulse Width: 0.5 ms
Lead Channel Setting Sensing Sensitivity: 0.5 mV
Pulse Gen Serial Number: 7136396

## 2019-02-14 NOTE — Telephone Encounter (Signed)
She should take her lasix daily I think this will help with her symptoms

## 2019-02-14 NOTE — Telephone Encounter (Signed)
Referred to ICM clinic by Levander Campion, RN device clinic due to [facility] has been decreased for past 16 days.  Call to patient to discuss report and she is feeling fine but does have some swelling in both legs during the day but resolves at bedtime.  Discussed diet and she thinks she follows low salt diet but some of the foods she is eating are high in salt such as cheese.  Advised recommendation to limit salt intake to 2000 mg daily and encouraged to read food labels for amount of salt per serving.  She does not weigh at home.  She said she rarely takes Furosemide 20 mg tablet because she thought she was only supposed to take occasionally.  Epic med list has Furosemide 20 mg 1 tablet daily prescribed since 05/26/2018.  Patient's cardiologist is Dr Acie Fredrickson and EP Dr Lovena Le.    Patient is not clear if she should be taking Furosemide 20 mg 1 tablet daily.    Advised will ask Dr Acie Fredrickson if the Furosemide dosage should be 20 mg daily or 20 mg PRN for fluid symptoms which is the way she has been taking it.  Will call her back with recommendation.        6/9 [facility] report

## 2019-02-15 MED ORDER — FUROSEMIDE 20 MG PO TABS: ORAL_TABLET | ORAL | 3 refills | Status: DC

## 2019-02-15 NOTE — Telephone Encounter (Signed)
Call to patient.  Advised Dr Acie Fredrickson recommended she take Furosemide 20 mg 1 tablet a day as prescribed.  She verbalized understanding and will need new prescription.  Preferred pharmacy is [facility] on [address] and would like a 90 day supply.  ICM remote transmission scheduled 02/22/2019 to check fluid levels.

## 2019-02-22 ENCOUNTER — Ambulatory Visit (INDEPENDENT_AMBULATORY_CARE_PROVIDER_SITE_OTHER): Payer: Medicare Other

## 2019-02-22 DIAGNOSIS — I5022 Chronic systolic (congestive) heart failure: Secondary | ICD-10-CM

## 2019-02-22 DIAGNOSIS — Z95810 Presence of automatic (implantable) cardiac defibrillator: Secondary | ICD-10-CM | POA: Diagnosis not present

## 2019-02-23 NOTE — Progress Notes (Signed)
Remote ICD transmission.   

## 2019-02-24 NOTE — Progress Notes (Signed)
EPIC Encounter for ICM Monitoring  Patient Name: Lori Beard is a 68 y.o. female Date: 02/24/2019 Primary Care Physican: Alroy Dust, L.Marlou Sa, MD Primary Cardiologist: N[place] Electrophysiologist: Lovena Le 02/24/2019 Weight: 209 lbs        1st ICM remote transmission.  Heart Failure questions reviewed.  Pt asymptomatic and feeling good at this time.  She has been taking BP and weighing daily.   Coruve thoracic impedance normal.   Prescribed: Furosemide 20 mg take 1 tablet daily.  Recommendations:  Reinforced limiting salt intake to < 2000 mg daily.  Follow-up plan: ICM clinic phone appointment on 03/27/2019.     Copy of ICM check sent to Dr. Lovena Le.   3 month ICM trend: 02/22/2019    1 Year ICM trend:       Rosalene Billings, RN 02/24/2019 1:37 PM

## 2019-03-27 ENCOUNTER — Ambulatory Visit (INDEPENDENT_AMBULATORY_CARE_PROVIDER_SITE_OTHER): Payer: Medicare Other

## 2019-03-27 DIAGNOSIS — Z95810 Presence of automatic (implantable) cardiac defibrillator: Secondary | ICD-10-CM

## 2019-03-27 DIAGNOSIS — I5022 Chronic systolic (congestive) heart failure: Secondary | ICD-10-CM

## 2019-03-29 ENCOUNTER — Telehealth: Payer: Self-pay

## 2019-03-29 NOTE — Progress Notes (Signed)
EPIC Encounter for ICM Monitoring  Patient Name: Lori Beard is a 68 y.o. female Date: 03/29/2019 Primary Care Physican: Alroy Dust, L.Marlou Sa, MD Primary Care Physican: Alroy Dust, Carlean Jews.Marlou Sa, MD Primary Cardiologist: Nahser Electrophysiologist: Lovena Le 02/24/2019 Weight: 209 lbs                                                            Attempted call to patient and unable to reach.  Transmission reviewed.    Coruve thoracic impedance normal.   Prescribed: Furosemide 20 mg take 1 tablet daily.  Recommendations:  Unable to reach.    Follow-up plan: ICM clinic phone appointment on 05/01/2019.     Copy of ICM check sent to Dr. Lovena Le.    Direct trend Viewer: 03/24/2019    Rosalene Billings, RN 03/29/2019 4:54 PM

## 2019-03-29 NOTE — Telephone Encounter (Signed)
Remote ICM transmission received.  Attempted call to patient regarding ICM remote transmission and no answer.  

## 2019-03-31 NOTE — Progress Notes (Signed)
Returned patient call as requested by voice mail message.  Transmission reviewed.  She said she is feeling good and has no complaints.  No changes and encouraged to call if experiencing any fluid symptoms.

## 2019-05-01 ENCOUNTER — Ambulatory Visit (INDEPENDENT_AMBULATORY_CARE_PROVIDER_SITE_OTHER): Payer: Medicare Other

## 2019-05-01 DIAGNOSIS — Z95810 Presence of automatic (implantable) cardiac defibrillator: Secondary | ICD-10-CM | POA: Diagnosis not present

## 2019-05-01 DIAGNOSIS — I5022 Chronic systolic (congestive) heart failure: Secondary | ICD-10-CM

## 2019-05-03 ENCOUNTER — Telehealth: Payer: Self-pay

## 2019-05-03 NOTE — Progress Notes (Signed)
EPIC Encounter for ICM Monitoring  Patient Name: Rocklyn Mayberry is a 68 y.o. female Date: 05/03/2019 Primary Care Physican: Alroy Dust, L.Marlou Sa, MD Primary Cardiologist:Nahser Electrophysiologist:Taylor 6/19/2020Weight: 209lbs    Attempted call to patient and unable to reach.  Transmission reviewed.   Coruve thoracic impedance normal but was suggestive of possible fluid accumulation 8/4 - 8/14.   Prescribed: Furosemide20 mg take 1 tablet daily.  Recommendations:Unable to reach.    Follow-up plan: ICM clinic phone appointment on10/19/2020. 91 day device clinic remote transmission 05/17/2019.  OV with Dr Acie Fredrickson 05/27/2019.  Copy of ICM check sent to Dr.Taylor.      3 month ICM trend: 05/02/2019    1 Year ICM trend:       Rosalene Billings, RN 05/03/2019 9:28 AM

## 2019-05-03 NOTE — Telephone Encounter (Signed)
Remote ICM transmission received.  Attempted call to patient regarding ICM remote transmission and no answer or option to leave voice mail message.   

## 2019-05-17 ENCOUNTER — Ambulatory Visit (INDEPENDENT_AMBULATORY_CARE_PROVIDER_SITE_OTHER): Payer: Medicare Other | Admitting: *Deleted

## 2019-05-17 DIAGNOSIS — I472 Ventricular tachycardia, unspecified: Secondary | ICD-10-CM

## 2019-05-17 DIAGNOSIS — I428 Other cardiomyopathies: Secondary | ICD-10-CM

## 2019-05-17 LAB — CUP PACEART REMOTE DEVICE CHECK
Battery Remaining Longevity: 56 mo
Battery Remaining Percentage: 55 %
Battery Voltage: 2.93 V
Brady Statistic RV Percent Paced: 1 %
Date Time Interrogation Session: 20200909062205
HighPow Impedance: 78 Ohm
HighPow Impedance: 78 Ohm
Implantable Lead Implant Date: 20141128
Implantable Lead Location: 753860
Implantable Lead Model: 7122
Implantable Pulse Generator Implant Date: 20141128
Lead Channel Impedance Value: 440 Ohm
Lead Channel Pacing Threshold Amplitude: 0.75 V
Lead Channel Pacing Threshold Pulse Width: 0.5 ms
Lead Channel Sensing Intrinsic Amplitude: 10.6 mV
Lead Channel Setting Pacing Amplitude: 2.5 V
Lead Channel Setting Pacing Pulse Width: 0.5 ms
Lead Channel Setting Sensing Sensitivity: 0.5 mV
Pulse Gen Serial Number: 7136396

## 2019-05-22 NOTE — Progress Notes (Signed)
Cardiology Office Note   Date:  05/23/2019   ID:  Lori Beard, Lori Beard [birth year], MRN [id number]  PCP:  Alroy Dust, Carlean Jews.Marlou Sa, MD  Cardiologist:   Mertie Moores, MD   Chief Complaint  Patient presents with  . Coronary Artery Disease        Lori Beard is a 68 y.o. female who presents for   1. Chronic systolic congestive heart failure 2. Hypertension 3 .obesty - s/p lap band surgery 4. coronary artery disease-status post non-ST segment elevation myocardial infarction complicated by ventricular tachycardia 5. Status post ICD placement 08/04/13   Previous notes Notes from [place] - 09/04/14:  Lori Beard is a 68 y.o. female with a hx of HTN, obesity s/p LAP-BAND surgery and sleep apnea. She was admitted 07/2013 with pneumonia. She ruled in for a NSTEMI. Her hospitalization was complicated by monomorphic VTach that progressed to VFib due to R on T PVC in the setting of hypokalemia with a K of 2.9. Echocardiogram (08/01/13): Mild LVH, global HK, EF 40-45%, Gr 1 DD, MAC, mild RVE, mildly reduced RVSF, PASP 31-35. LHC (08/02/13): Normal coronary arteries, EF 35%, LVEDP 17. She was seen by EP and underwent ICD implantation for secondary prevention of malignant ventricular arrhythmias in the setting of nonischemic cardiomyopathy. She underwent St. [name] single-chamber defibrillator implantation by Dr. Lovena Beard 08/04/13.  I saw her 08/11/13. She had resumed verapamil and metoprolol due to high blood pressures. I stopped these medications and placed on losartan and increased her BiDil to 3x per day.   Doing well since last seen. The patient denies chest pain, shortness of breath, syncope, orthopnea, PND or significant pedal edema. No ICD shocks.   Jan. 30, 2015:  Lori Beard presents today for followup evaluation of her hypertension and chronic systolic congestive heart failure. Her left ventricular ejection fraction is approximate 35%. She has a history of a cardiac  arrest and has had an ICD placed. She sees Dr. Cristopher Peru for management of that.  12/27/2013:  Doing well  Oct. 26, 2015:  She is only taking the BiDil twice a day. He causes profound weakness and dizziness. She was originally written to take it 3 times a day but she's not able to take it twice a day.   . Even still, he causes her to be very weak and she has to lie down after taking it for the next 30 minutes to one hour. Otherwise she's doing well. He's not having any profound shortness of breath. No chest pain  Jan. 26, 2016:  Doing well.  No cp or dyspnea.   Is on hydralazine and ISDN instead of BiDIl now.    Jan. 19, 2017:  Lori Beard is doing well. No CP or dyspnea.  Has CAD - hx of MI Has CHF and and ICD .   June 07, 2018: Lori Beard is seen today for follow-up visit of her coronary artery disease and Chronic systolic congestive heart failure.  Last echocardiogram was May 2019 which reveals left ventricular ejection fraction of 50%. She has mild pulmonary artery hypertension with an estimated PA pressure of 38 mmHg.  Had a "hot sensation" yesterday  Had numbness in her left arm and left fingers.  Not associated with any chest pain .  Went to ER.  She was started on gabapentin which seems to have helped.  Walks frequently .   No CP or dyspnea.     Sept. 15, 2020  Lori Beard is seen today for CAD and CHF  Doing well.   Needs to restart her exercise No significant CP  Has ICD  Last echocardiogram from May 2019 reveals normal left ventricular systolic function with an ejection fraction of 50 to 55%.   Past Medical History:  Diagnosis Date  . Aneurysm of right iliac artery (HCC)   . Arthritis    ALL OVER  . Cardiac arrest ([place]) 08/01/2013   Polymorphic VT  . Chronic systolic CHF (congestive heart failure) ([place])   . GERD (gastroesophageal reflux disease)   . Hypertension   . Morbid obesity ([place])    a. s/p lapband surgery 11/2012.  [name] Myocardial infarction ([place]) 08/06/13  .  NICM (nonischemic cardiomyopathy) ([place])    Echocardiogram (08/01/13): Mild LVH, global HK, EF 40-45%, Gr 1 DD, MAC, mild RVE, mildly reduced RVSF, PASP 31-35.  LHC (08/02/13):  Normal coronary arteries, EF 35%, LVEDP 17.  . Pneumonia 08/01/2013  . Renal failure, acute ([place]) 08/01/2013  . S/P implantation of automatic cardioverter/defibrillator (AICD)    07/2013 Lori Beard)  . Sleep apnea 02/15/2012   SLEEP STUDY IN EPIC - MILD OSA-CPAP NOT RECOMMENDED-HOME OXYGEN SUGGESTED BECAUSE OF OXYGEN DESATS ON ROOM AIR.  [name] Ventricular tachycardia (HCC)    a. R on T PVC during admission for pneumonia/NSTEMI => monomorphic VT=>VF=>defib; s/p ICD    Past Surgical History:  Procedure Laterality Date  . ABDOMINAL HYSTERECTOMY    . BREATH TEK H PYLORI  02/15/2012   Procedure: BREATH TEK H PYLORI;  Surgeon: Pedro Earls, MD;  Location: [facility];  Service: General;  Laterality: N/A;  745  . CARDIAC CATHETERIZATION  08/02/2013   Normal coronary arteries, EF 35%   . CHOLECYSTECTOMY    . IMPLANTABLE CARDIOVERTER DEFIBRILLATOR IMPLANT N/A 08/04/2013   Procedure: IMPLANTABLE CARDIOVERTER DEFIBRILLATOR IMPLANT;  Surgeon: Evans Lance, MD;  Location: [facility] CATH LAB;  Service: Cardiovascular;  Laterality: N/A;  . JOINT REPLACEMENT     BILATERAL TOTAL HIP REPLACEMENTS  . KNEE ARTHROSCOPY    . LAPAROSCOPIC GASTRIC BANDING    . LEFT HEART CATHETERIZATION WITH CORONARY ANGIOGRAM N/A 08/02/2013   Procedure: LEFT HEART CATHETERIZATION WITH CORONARY ANGIOGRAM;  Surgeon: Wellington Hampshire, MD;  Location: [facility]ATH LAB;  Service: Cardiovascular;  Laterality: N/A;  . MESH APPLIED TO LAP PORT N/A 11/22/2012   Procedure: MESH APPLIED TO LAP PORT;  Surgeon: Pedro Earls, MD;  Location: [facility]S;  Service: General;  Laterality: N/A;  . PACEMAKER INSERTION   08/04/2013   St. [name] single chamber ICD     Current Outpatient Medications  Medication Sig Dispense Refill  . amitriptyline (ELAVIL) 25 MG tablet Take 75 mg by  mouth at bedtime.    [name] aspirin 81 MG tablet Take 1 tablet (81 mg total) by mouth every evening.    [name] atorvastatin (LIPITOR) 10 MG tablet Take 1 tablet (10 mg total) by mouth at bedtime. 90 tablet 3  . carvedilol (COREG) 25 MG tablet Take 1 tablet (25 mg total) by mouth 2 (two) times daily with a meal. 180 tablet 3  . cetirizine (ZYRTEC) 5 MG tablet Take 1 tablet (5 mg total) by mouth daily. 14 tablet 1  . furosemide (LASIX) 20 MG tablet Take 1 tablet (20 mg total) by mouth every morning. 180 tablet 3  . gabapentin (NEURONTIN) 300 MG capsule Take 1 capsule (300 mg total) by mouth 3 (three) times daily as needed for up to 10 days (Nerve pain and tingling). 30 capsule 0  . hydrALAZINE (APRESOLINE) 25 MG tablet  Take 2 tablets (50 mg total) by mouth 2 (two) times a day. 90 tablet 1  . hydrocerin (EUCERIN) CREA Apply 1 application topically 2 (two) times daily. 454 g 0  . HYDROcodone-acetaminophen (NORCO) 7.5-325 MG tablet Take 1 tablet by mouth every 8 (eight) hours as needed for moderate pain or severe pain.   0  . isosorbide dinitrate (ISORDIL) 20 MG tablet Take 1 tablet (20 mg total) by mouth 2 (two) times daily. 180 tablet 1  . losartan (COZAAR) 50 MG tablet Take 1 tablet (50 mg total) by mouth daily. 30 tablet 2  . potassium chloride SA (K-DUR,KLOR-CON) 20 MEQ tablet TAKE 2 TABLETS BY MOUTH DAILY 180 tablet 1  . triamcinolone ointment (KENALOG) 0.5 % Apply 1 application topically 2 (two) times daily. 30 g 0   No current facility-administered medications for this visit.     Allergies:   Iron, Ace inhibitors, and Morphine and related    Social History:  The patient  reports that she has never smoked. She has never used smokeless tobacco. She reports that she does not drink alcohol or use drugs.   Family History:  The patient's family history includes Breast cancer in her sister; Colon cancer in her father; Heart disease in her father; Hypertension in her father and paternal grandmother; Kidney  disease in her mother; Stroke in her paternal grandmother.    ROS:  Please see the history of present illness.   Otherwise, review of systems are positive for none.   All other systems are reviewed and negative.   Physical Exam: Blood pressure 124/78, pulse 64, height 5' 3.5" (1.613 m), weight 216 lb 12.8 oz (98.3 kg), SpO2 96 %.  GEN:  Well nourished, well developed in no acute distress HEENT: Normal NECK: No JVD; No carotid bruits LYMPHATICS: No lymphadenopathy CARDIAC: RRR  RESPIRATORY:  Clear to auscultation without rales, wheezing or rhonchi  ABDOMEN: Soft, non-tender, non-distended MUSCULOSKELETAL:  No edema; No deformity  SKIN: Warm and dry NEUROLOGIC:  Alert and oriented x 3   EKG:    Sept. 15, 2020    :  NSR at 11.   Minimal criteria for LVH.  Possible ant MI   Recent Labs: 05/23/2019: ALT 8; BUN 17; Creatinine, Ser 1.28; Potassium 4.2; Sodium 143    Lipid Panel    Component Value Date/Time   CHOL 110 05/23/2019 1100   TRIG 74 05/23/2019 1100   HDL 44 05/23/2019 1100   CHOLHDL 2.5 05/23/2019 1100   CHOLHDL 3.0 09/26/2015 1558   VLDL 17 09/26/2015 1558   LDLCALC 51 06/07/2018 1000      Wt Readings from Last 3 Encounters:  05/23/19 216 lb 12.8 oz (98.3 kg)  12/12/18 208 lb (94.3 kg)  06/07/18 207 lb 1.9 oz (93.9 kg)      Other studies Reviewed: Additional studies/ records that were reviewed today include: . Review of the above records demonstrates:    ASSESSMENT AND PLAN:  1. Chronic systolic congestive heart failure and is doing well.  She is not having any shortness of breath.  Her last echocardiogram shows that her ejection fraction has now normalized.  2. Hypertension-blood pressure is well controlled.  Continue current medications.  3 .obesty -   advised her to continue with weight loss program.  4. coronary artery disease-status post non-ST segment elevation myocardial infarction complicated by ventricular tachycardia -   He is not having any  episodes of angina.  5. Status post ICD placement 08/04/13 - -managed by  EP..   Current medicines are reviewed at length with the patient today.  The patient does not have concerns regarding medicines.  The following changes have been made:  no change  Labs/ tests ordered today include: BMP     Orders Placed This Encounter  Procedures  . Hepatic function panel  . Lipid panel  . Basic metabolic panel  . EKG 12-Lead    Disposition:   FU with APP  in 6 months.   Will see me in 1 year   Signed, Mertie Moores, MD  05/23/2019 5:27 PM    [facility] [address], [place], [place]  [address] Phone: [phone]; Fax: [phone]

## 2019-05-23 ENCOUNTER — Ambulatory Visit (INDEPENDENT_AMBULATORY_CARE_PROVIDER_SITE_OTHER): Payer: Medicare Other | Admitting: Cardiovascular Disease

## 2019-05-23 ENCOUNTER — Other Ambulatory Visit: Payer: Self-pay

## 2019-05-23 ENCOUNTER — Encounter: Payer: Self-pay | Admitting: Cardiovascular Disease

## 2019-05-23 VITALS — BP 124/78 | HR 64 | Ht 63.5 in | Wt 216.8 lb

## 2019-05-23 DIAGNOSIS — I5022 Chronic systolic (congestive) heart failure: Secondary | ICD-10-CM | POA: Diagnosis not present

## 2019-05-23 DIAGNOSIS — E785 Hyperlipidemia, unspecified: Secondary | ICD-10-CM

## 2019-05-23 DIAGNOSIS — I10 Essential (primary) hypertension: Secondary | ICD-10-CM | POA: Diagnosis not present

## 2019-05-23 DIAGNOSIS — I2510 Atherosclerotic heart disease of native coronary artery without angina pectoris: Secondary | ICD-10-CM

## 2019-05-23 LAB — LIPID PANEL
Chol/HDL Ratio: 2.5 ratio (ref 0.0–4.4)
Cholesterol, Total: 110 mg/dL (ref 100–199)
HDL: 44 mg/dL (ref >39)
LDL Chol Calc (NIH): 51 mg/dL (ref 0–99)
Triglycerides: 74 mg/dL (ref 0–149)
VLDL Cholesterol Cal: 15 mg/dL (ref 5–40)

## 2019-05-23 LAB — HEPATIC FUNCTION PANEL
ALT: 8 IU/L (ref 0–32)
AST: 10 IU/L (ref 0–40)
Albumin: 3.5 g/dL — ABNORMAL LOW (ref 3.8–4.8)
Alkaline Phosphatase: 109 IU/L (ref 39–117)
Bilirubin Total: 0.3 mg/dL (ref 0.0–1.2)
Bilirubin, Direct: 0.07 mg/dL (ref 0.00–0.40)
Total Protein: 6.8 g/dL (ref 6.0–8.5)

## 2019-05-23 LAB — BASIC METABOLIC PANEL
BUN/Creatinine Ratio: 13 (ref 12–28)
BUN: 17 mg/dL (ref 8–27)
CO2: 28 mmol/L (ref 20–29)
Calcium: 8.6 mg/dL — ABNORMAL LOW (ref 8.7–10.3)
Chloride: 103 mmol/L (ref 96–106)
Creatinine, Ser: 1.28 mg/dL — ABNORMAL HIGH (ref 0.57–1.00)
GFR calc Af Amer: 50 mL/min/{1.73_m2} — ABNORMAL LOW (ref >59)
GFR calc non Af Amer: 43 mL/min/{1.73_m2} — ABNORMAL LOW (ref >59)
Glucose: 105 mg/dL — ABNORMAL HIGH (ref 65–99)
Potassium: 4.2 mmol/L (ref 3.5–5.2)
Sodium: 143 mmol/L (ref 134–144)

## 2019-05-23 NOTE — Patient Instructions (Signed)
Medication Instructions:  Your physician recommends that you continue on your current medications as directed. Please refer to the Current Medication list given to you today.  If you need a refill on your cardiac medications before your next appointment, please call your pharmacy.   Lab work: Lipid, Liver and BMET today  If you have labs (blood work) drawn today and your tests are completely normal, you will receive your results only by: [name] MyChart Message (if you have MyChart) OR . A paper copy in the mail If you have any lab test that is abnormal or we need to change your treatment, we will call you to review the results.  Testing/Procedures: None  Follow-Up: At [facility], you and your health needs are our priority.  As part of our continuing mission to provide you with exceptional heart care, we have created designated Provider Care Teams.  These Care Teams include your primary Cardiologist (physician) and Advanced Practice Providers (APPs -  Physician Assistants and Nurse Practitioners) who all work together to provide you with the care you need, when you need it. You will need a follow up appointment in:  12 months.  Please call our office 2 months in advance to schedule this appointment.  You may see Mertie Moores, MD or one of the following Advanced Practice Providers on your designated Care Team: Richardson Dopp, PA-C [place], [place] . Daune Perch, NP  Any Other Special Instructions Will Be Listed Below (If Applicable).

## 2019-06-01 NOTE — Progress Notes (Signed)
Remote ICD transmission.   

## 2019-06-26 ENCOUNTER — Ambulatory Visit (INDEPENDENT_AMBULATORY_CARE_PROVIDER_SITE_OTHER): Payer: Medicare Other

## 2019-06-26 DIAGNOSIS — I5022 Chronic systolic (congestive) heart failure: Secondary | ICD-10-CM

## 2019-06-26 DIAGNOSIS — Z95810 Presence of automatic (implantable) cardiac defibrillator: Secondary | ICD-10-CM | POA: Diagnosis not present

## 2019-06-28 NOTE — Progress Notes (Signed)
EPIC Encounter for ICM Monitoring  Patient Name: Lori Beard is a 68 y.o. female Date: 06/28/2019 Primary Care Physican: Alroy Dust, L.Marlou Sa, MD Primary Cardiologist:Nahser Electrophysiologist:Taylor 9/15/2020Weight: 216lbs    Transmission reviewed.   Coruve thoracic impedance normal.   Prescribed: Furosemide20 mg take 1 tablet daily.  Recommendations: None  Follow-up plan: ICM clinic phone appointment on 07/31/2019.   91 day device clinic remote transmission 08/16/2019.    Copy of ICM check sent to Dr. Lovena Le.   3 month ICM trend: 06/26/2019    1 Year ICM trend:       Rosalene Billings, RN 06/28/2019 9:44 AM

## 2019-08-02 ENCOUNTER — Telehealth: Payer: Self-pay

## 2019-08-02 NOTE — Telephone Encounter (Signed)
Unable to speak  with patient to remind of missed remote transmission 

## 2019-08-02 NOTE — Progress Notes (Signed)
No ICM remote transmission received for 07/31/2019 and next ICM transmission scheduled for 08/17/2019.

## 2019-08-16 ENCOUNTER — Ambulatory Visit (INDEPENDENT_AMBULATORY_CARE_PROVIDER_SITE_OTHER): Payer: Medicare Other | Admitting: *Deleted

## 2019-08-16 DIAGNOSIS — I5022 Chronic systolic (congestive) heart failure: Secondary | ICD-10-CM | POA: Diagnosis not present

## 2019-08-17 LAB — CUP PACEART REMOTE DEVICE CHECK
Battery Remaining Longevity: 54 mo
Battery Remaining Percentage: 52 %
Battery Voltage: 2.93 V
Brady Statistic RV Percent Paced: 1 %
Date Time Interrogation Session: 20201210132704
HighPow Impedance: 77 Ohm
HighPow Impedance: 77 Ohm
Implantable Lead Implant Date: 20141128
Implantable Lead Location: 753860
Implantable Lead Model: 7122
Implantable Pulse Generator Implant Date: 20141128
Lead Channel Impedance Value: 450 Ohm
Lead Channel Pacing Threshold Amplitude: 0.75 V
Lead Channel Pacing Threshold Pulse Width: 0.5 ms
Lead Channel Sensing Intrinsic Amplitude: 10.2 mV
Lead Channel Setting Pacing Amplitude: 2.5 V
Lead Channel Setting Pacing Pulse Width: 0.5 ms
Lead Channel Setting Sensing Sensitivity: 0.5 mV
Pulse Gen Serial Number: 7136396

## 2019-08-18 ENCOUNTER — Ambulatory Visit (INDEPENDENT_AMBULATORY_CARE_PROVIDER_SITE_OTHER): Payer: Medicare Other

## 2019-08-18 DIAGNOSIS — I5022 Chronic systolic (congestive) heart failure: Secondary | ICD-10-CM

## 2019-08-18 DIAGNOSIS — Z95810 Presence of automatic (implantable) cardiac defibrillator: Secondary | ICD-10-CM

## 2019-08-18 NOTE — Progress Notes (Signed)
EPIC Encounter for ICM Monitoring  Patient Name: Lori Beard is a 68 y.o. female Date: 08/18/2019 Primary Care Physican: Alroy Dust, L.Marlou Sa, MD Primary Cardiologist:Nahser Electrophysiologist:Taylor 9/15/2020Weight: [phone]    Spoke with patient and she is feeling well.  Voices no complaints.  Coruve thoracic impedance normal.   Prescribed: Furosemide20 mg take 1 tablet daily.  Recommendations: None  Follow-up plan: ICM clinic phone appointment on 09/18/2019.   91 day device clinic remote transmission 11/15/2019.    Copy of ICM check sent to Dr. Lovena Le.   3 month ICM trend: 08/17/2019    1 Year ICM trend:       Rosalene Billings, RN 08/18/2019 4:16 PM

## 2019-09-14 ENCOUNTER — Emergency Department (HOSPITAL_COMMUNITY)
Admission: EM | Admit: 2019-09-14 | Discharge: 2019-09-14 | Disposition: A | Discharge status: Home / Self Care | Payer: Medicare Other | Attending: Emergency Medicine | Admitting: Emergency Medicine

## 2019-09-14 ENCOUNTER — Encounter (HOSPITAL_COMMUNITY): Payer: Self-pay | Admitting: *Deleted

## 2019-09-14 ENCOUNTER — Other Ambulatory Visit: Payer: Self-pay

## 2019-09-14 DIAGNOSIS — Z79899 Other long term (current) drug therapy: Secondary | ICD-10-CM | POA: Diagnosis not present

## 2019-09-14 DIAGNOSIS — R531 Weakness: Secondary | ICD-10-CM | POA: Diagnosis not present

## 2019-09-14 DIAGNOSIS — I5022 Chronic systolic (congestive) heart failure: Secondary | ICD-10-CM | POA: Diagnosis not present

## 2019-09-14 DIAGNOSIS — I2510 Atherosclerotic heart disease of native coronary artery without angina pectoris: Secondary | ICD-10-CM | POA: Diagnosis not present

## 2019-09-14 DIAGNOSIS — Z95810 Presence of automatic (implantable) cardiac defibrillator: Secondary | ICD-10-CM | POA: Diagnosis not present

## 2019-09-14 DIAGNOSIS — R42 Dizziness and giddiness: Secondary | ICD-10-CM | POA: Diagnosis present

## 2019-09-14 DIAGNOSIS — Z7982 Long term (current) use of aspirin: Secondary | ICD-10-CM | POA: Diagnosis not present

## 2019-09-14 DIAGNOSIS — Z950 Presence of cardiac pacemaker: Secondary | ICD-10-CM | POA: Insufficient documentation

## 2019-09-14 DIAGNOSIS — I110 Hypertensive heart disease with heart failure: Secondary | ICD-10-CM | POA: Insufficient documentation

## 2019-09-14 DIAGNOSIS — Z96643 Presence of artificial hip joint, bilateral: Secondary | ICD-10-CM | POA: Diagnosis not present

## 2019-09-14 LAB — CBC WITH DIFFERENTIAL/PLATELET
Abs Immature Granulocytes: 0.02 10*3/uL (ref 0.00–0.07)
Basophils Absolute: 0 10*3/uL (ref 0.0–0.1)
Basophils Relative: 0 %
Eosinophils Absolute: 0.1 10*3/uL (ref 0.0–0.5)
Eosinophils Relative: 1 %
HCT: 33.5 % — ABNORMAL LOW (ref 36.0–46.0)
Hemoglobin: 9.7 g/dL — ABNORMAL LOW (ref 12.0–15.0)
Immature Granulocytes: 0 %
Lymphocytes Relative: 24 %
Lymphs Abs: 1.4 10*3/uL (ref 0.7–4.0)
MCH: 26.4 pg (ref 26.0–34.0)
MCHC: 29 g/dL — ABNORMAL LOW (ref 30.0–36.0)
MCV: 91 fL (ref 80.0–100.0)
Monocytes Absolute: 0.3 10*3/uL (ref 0.1–1.0)
Monocytes Relative: 6 %
Neutro Abs: 3.9 10*3/uL (ref 1.7–7.7)
Neutrophils Relative %: 69 %
Platelets: 179 10*3/uL (ref 150–400)
RBC: 3.68 MIL/uL — ABNORMAL LOW (ref 3.87–5.11)
RDW: 14.8 % (ref 11.5–15.5)
WBC: 5.7 10*3/uL (ref 4.0–10.5)
nRBC: 0 % (ref 0.0–0.2)

## 2019-09-14 LAB — COMPREHENSIVE METABOLIC PANEL
ALT: 10 U/L (ref 0–44)
AST: 11 U/L — ABNORMAL LOW (ref 15–41)
Albumin: 3 g/dL — ABNORMAL LOW (ref 3.5–5.0)
Alkaline Phosphatase: 93 U/L (ref 38–126)
Anion gap: 7 (ref 5–15)
BUN: 18 mg/dL (ref 8–23)
CO2: 31 mmol/L (ref 22–32)
Calcium: 8.7 mg/dL — ABNORMAL LOW (ref 8.9–10.3)
Chloride: 104 mmol/L (ref 98–111)
Creatinine, Ser: 1.15 mg/dL — ABNORMAL HIGH (ref 0.44–1.00)
GFR calc Af Amer: 56 mL/min — ABNORMAL LOW (ref >60)
GFR calc non Af Amer: 49 mL/min — ABNORMAL LOW (ref >60)
Glucose, Bld: 106 mg/dL — ABNORMAL HIGH (ref 70–99)
Potassium: 4.3 mmol/L (ref 3.5–5.1)
Sodium: 142 mmol/L (ref 135–145)
Total Bilirubin: 0.4 mg/dL (ref 0.3–1.2)
Total Protein: 7 g/dL (ref 6.5–8.1)

## 2019-09-14 MED ORDER — ONDANSETRON HCL 4 MG/2ML IJ SOLN
4.0000 mg | Freq: Once | INTRAMUSCULAR | Status: AC
  Administered 2019-09-14: 4 mg via INTRAVENOUS
  Filled 2019-09-14: qty 2

## 2019-09-14 MED ORDER — MECLIZINE HCL 25 MG PO TABS
25.0000 mg | ORAL_TABLET | Freq: Once | ORAL | Status: AC
  Administered 2019-09-14: 18:00:00 25 mg via ORAL
  Filled 2019-09-14: qty 1

## 2019-09-14 MED ORDER — SODIUM CHLORIDE 0.9 % IV BOLUS
1000.0000 mL | Freq: Once | INTRAVENOUS | Status: AC
  Administered 2019-09-14: 18:00:00 1000 mL via INTRAVENOUS

## 2019-09-14 MED ORDER — MECLIZINE HCL 25 MG PO TABS: 25.0000 mg | ORAL_TABLET | Freq: Three times a day (TID) | ORAL | 0 refills | Status: DC | PRN

## 2019-09-14 MED ORDER — ONDANSETRON 4 MG PO TBDP: ORAL_TABLET | ORAL | 0 refills | Status: DC

## 2019-09-14 NOTE — ED Triage Notes (Signed)
Pt woke up this morning with dizziness and nausea. Pt denies emesis. Pt has not taken any of her medication today. Pt received zofran en route. Pt states zofran helped but still dizzy.   BP 157/80 HR 72 SpO2 97-100% on RA CBG 133 Temp 98.1

## 2019-09-14 NOTE — Discharge Instructions (Addendum)
Drink plenty of fluids and follow-up with your family doctor next week if any problems

## 2019-09-14 NOTE — ED Provider Notes (Signed)
[facility] DEPT Provider Note   CSN: [id number] Arrival date & time: 09/14/19  1609     History Chief Complaint  Patient presents with   Weakness    Lori Beard is a 69 y.o. female.  Patient complains of dizziness.  She states that when she got up today she felt like the room was spinning.  Some nausea which has improved  The history is provided by the patient. No language interpreter was used.  Weakness Severity:  Moderate Onset quality:  Sudden Timing:  Intermittent Progression:  Waxing and waning Chronicity:  New Context: not alcohol use   Relieved by:  Nothing Worsened by:  Nothing Ineffective treatments:  None tried Associated symptoms: dizziness and nausea   Associated symptoms: no abdominal pain, no chest pain, no cough, no diarrhea, no frequency, no headaches and no seizures        Past Medical History:  Diagnosis Date   Aneurysm of right iliac artery ([place])    Arthritis    ALL OVER   Cardiac arrest ([place]) 08/01/2013   Polymorphic VT   Chronic systolic CHF (congestive heart failure) (HCC)    GERD (gastroesophageal reflux disease)    Hypertension    Morbid obesity ([place])    a. s/p lapband surgery 11/2012.   Myocardial infarction [facility]) 08/06/13   NICM (nonischemic cardiomyopathy) ([place])    Echocardiogram (08/01/13): Mild LVH, global HK, EF 40-45%, Gr 1 DD, MAC, mild RVE, mildly reduced RVSF, PASP 31-35.  LHC (08/02/13):  Normal coronary arteries, EF 35%, LVEDP 17.   Pneumonia 08/01/2013   Renal failure, acute ([place]) 08/01/2013   S/P implantation of automatic cardioverter/defibrillator (AICD)    07/2013 [facility])   Sleep apnea 02/15/2012   SLEEP STUDY IN EPIC - MILD OSA-CPAP NOT RECOMMENDED-HOME OXYGEN SUGGESTED BECAUSE OF OXYGEN DESATS ON ROOM AIR.   Ventricular tachycardia (HCC)    a. R on T PVC during admission for pneumonia/NSTEMI => monomorphic VT=>VF=>defib; s/p ICD    Patient Active Problem List   Diagnosis Date Noted   CAD (coronary artery disease) 05/23/2019   AKI (acute kidney injury) ([place]) 01/22/2018   Right iliac artery stenosis ([place]) 01/22/2018   Constipation 01/22/2018   Weight loss 01/07/2018   Morbid obesity ([place])    Hypertension    GERD (gastroesophageal reflux disease)    Chronic systolic CHF (congestive heart failure) (HCC)    S/P implantation of automatic cardioverter/defibrillator (AICD)    History of MI (myocardial infarction) 08/06/2013   NICM (nonischemic cardiomyopathy) ([place]) 08/03/2013   Ventricular tachycardia ([place]) 08/02/2013   Hypokalemia 08/02/2013   HTN (hypertension) 08/01/2013   Anemia 08/01/2013   Thrombocytopenia ([place]) 08/01/2013   Lapband APS + hiatus hernia repair March 2014 11/23/2012   Arthritis-bilateral hip replacements 11/10/2012   Sleep apnea 02/15/2012   Obesity 01/22/2012    Past Surgical History:  Procedure Laterality Date   ABDOMINAL HYSTERECTOMY     BREATH TEK H PYLORI  02/15/2012   Procedure: BREATH TEK H PYLORI;  Surgeon: Pedro Earls, MD;  Location: [facility];  Service: General;  Laterality: N/A;  [place]  08/02/2013   Normal coronary arteries, EF 35%    CHOLECYSTECTOMY     IMPLANTABLE CARDIOVERTER DEFIBRILLATOR IMPLANT N/A 08/04/2013   Procedure: IMPLANTABLE CARDIOVERTER DEFIBRILLATOR IMPLANT;  Surgeon: Evans Lance, MD;  Location: [facility] CATH LAB;  Service: Cardiovascular;  Laterality: N/A;   JOINT REPLACEMENT     BILATERAL TOTAL HIP REPLACEMENTS   KNEE ARTHROSCOPY  LAPAROSCOPIC GASTRIC BANDING     LEFT HEART CATHETERIZATION WITH CORONARY ANGIOGRAM N/A 08/02/2013   Procedure: LEFT HEART CATHETERIZATION WITH CORONARY ANGIOGRAM;  Surgeon: Wellington Hampshire, MD;  Location: [facility] CATH LAB;  Service: Cardiovascular;  Laterality: N/A;   MESH APPLIED TO LAP PORT N/A 11/22/2012   Procedure: MESH APPLIED TO LAP PORT;  Surgeon: Pedro Earls, MD;  Location: [facility]S;  Service:  General;  Laterality: N/A;   PACEMAKER INSERTION   08/04/2013   St. [name] single chamber ICD     OB History   No obstetric history on file.     Family History  Problem Relation Age of Onset   Kidney disease Mother    Heart disease Father    Hypertension Father    Colon cancer Father    Stroke Paternal Grandmother    Hypertension Paternal Grandmother    Breast cancer Sister    Heart attack Neg Hx     Social History   Tobacco Use   Smoking status: Never Smoker   Smokeless tobacco: Never Used  Substance Use Topics   Alcohol use: No   Drug use: No    Home Medications Prior to Admission medications   Medication Sig Start Date End Date Taking? Authorizing Provider  amitriptyline (ELAVIL) 25 MG tablet Take 75 mg by mouth at bedtime. 04/12/12  Yes Dunn, Areta Haber, PA-C  aspirin 81 MG tablet Take 1 tablet (81 mg total) by mouth every evening. 10/06/13  Yes Nahser, Wonda Cheng, MD  atorvastatin (LIPITOR) 10 MG tablet Take 1 tablet (10 mg total) by mouth at bedtime. 12/12/18 12/12/19 Yes Weaver, Scott T, PA-C  carvedilol (COREG) 25 MG tablet Take 1 tablet (25 mg total) by mouth 2 (two) times daily with a meal. 12/12/18 12/12/19 Yes Weaver, Scott T, PA-C  cetirizine (ZYRTEC) 5 MG tablet Take 1 tablet (5 mg total) by mouth daily. 05/04/17  Yes English, Colletta Maryland D, PA  furosemide (LASIX) 20 MG tablet Take 1 tablet (20 mg total) by mouth every morning. 02/15/19  Yes Nahser, Wonda Cheng, MD  gabapentin (NEURONTIN) 300 MG capsule Take 1 capsule (300 mg total) by mouth 3 (three) times daily as needed for up to 10 days (Nerve pain and tingling). 05/25/18  Yes Joy, Shawn C, PA-C  hydrALAZINE (APRESOLINE) 25 MG tablet Take 2 tablets (50 mg total) by mouth 2 (two) times a day. 12/30/18 12/25/19 Yes Weaver, Scott T, PA-C  HYDROcodone-acetaminophen (NORCO) 7.5-325 MG tablet Take 1 tablet by mouth every 8 (eight) hours as needed for moderate pain or severe pain.  01/07/16  Yes [provider]    isosorbide dinitrate (ISORDIL) 20 MG tablet Take 1 tablet (20 mg total) by mouth 2 (two) times daily. 01/17/19  Yes Weaver, Scott T, PA-C  losartan (COZAAR) 50 MG tablet Take 1 tablet (50 mg total) by mouth daily. 01/09/18  Yes Emokpae, Courage, MD  pantoprazole (PROTONIX) 40 MG tablet Take 40 mg by mouth daily. 09/12/19  Yes [provider]  potassium chloride SA (K-DUR,KLOR-CON) 20 MEQ tablet TAKE 2 TABLETS BY MOUTH DAILY 10/12/18  Yes Nahser, Wonda Cheng, MD  hydrocerin (EUCERIN) CREA Apply 1 application topically 2 (two) times daily. Patient not taking: Reported on 09/14/2019 01/23/18   Samuella Cota, MD  meclizine (ANTIVERT) 25 MG tablet Take 1 tablet (25 mg total) by mouth 3 (three) times daily as needed for dizziness. 09/14/19   Milton Ferguson, MD  ondansetron (ZOFRAN ODT) 4 MG disintegrating tablet 85m ODT q4 hours  prn nausea/vomit 09/14/19   Milton Ferguson, MD  triamcinolone ointment (KENALOG) 0.5 % Apply 1 application topically 2 (two) times daily. Patient not taking: Reported on 09/14/2019 01/18/18   Harrison Mons, PA    Allergies    Iron, Ace inhibitors, and Morphine and related  Review of Systems   Review of Systems  Constitutional: Negative for appetite change and fatigue.  HENT: Negative for congestion, ear discharge and sinus pressure.   Eyes: Negative for discharge.  Respiratory: Negative for cough.   Cardiovascular: Negative for chest pain.  Gastrointestinal: Positive for nausea. Negative for abdominal pain and diarrhea.  Genitourinary: Negative for frequency and hematuria.  Musculoskeletal: Negative for back pain.  Skin: Negative for rash.  Neurological: Positive for dizziness. Negative for seizures and headaches.  Psychiatric/Behavioral: Negative for hallucinations.    Physical Exam Updated Vital Signs BP (!) 146/91 (BP Location: Right Arm)    Pulse 72    Temp 97.8 F (36.6 C) (Oral)    Resp 11    SpO2 93%   Physical Exam  ED Results / Procedures / Treatments    Labs (all labs ordered are listed, but only abnormal results are displayed) Labs Reviewed  CBC WITH DIFFERENTIAL/PLATELET - Abnormal; Notable for the following components:      Result Value   RBC 3.68 (*)    Hemoglobin 9.7 (*)    HCT 33.5 (*)    MCHC 29.0 (*)    All other components within normal limits  COMPREHENSIVE METABOLIC PANEL - Abnormal; Notable for the following components:   Glucose, Bld 106 (*)    Creatinine, Ser 1.15 (*)    Calcium 8.7 (*)    Albumin 3.0 (*)    AST 11 (*)    GFR calc non Af Amer 49 (*)    GFR calc Af Amer 56 (*)    All other components within normal limits    EKG None  Radiology No results found.  Procedures Procedures (including critical care time)  Medications Ordered in ED Medications  sodium chloride 0.9 % bolus 1,000 mL (1,000 mLs Intravenous New Bag/Given 09/14/19 1756)  meclizine (ANTIVERT) tablet 25 mg (25 mg Oral Given 09/14/19 1757)  ondansetron (ZOFRAN) injection 4 mg (4 mg Intravenous Given 09/14/19 1757)    ED Course  I have reviewed the triage vital signs and the nursing notes.  Pertinent labs & imaging results that were available during my care of the patient were reviewed by me and considered in my medical decision making (see chart for details).    MDM Rules/Calculators/A&P                      Labs unremarkable except for mild anemia.  Patient improved with the Antivert.  Suspect vertigo.  She is sent home with Antivert and Zofran and will follow up with her PCP if any problems Final Clinical Impression(s) / ED Diagnoses Final diagnoses:  Vertigo    Rx / DC Orders ED Discharge Orders         Ordered    meclizine (ANTIVERT) 25 MG tablet  3 times daily PRN     09/14/19 1857    ondansetron (ZOFRAN ODT) 4 MG disintegrating tablet     09/14/19 1857           Milton Ferguson, MD 09/14/19 1901

## 2019-09-18 ENCOUNTER — Ambulatory Visit (INDEPENDENT_AMBULATORY_CARE_PROVIDER_SITE_OTHER): Payer: Medicare Other

## 2019-09-18 DIAGNOSIS — I5022 Chronic systolic (congestive) heart failure: Secondary | ICD-10-CM

## 2019-09-18 DIAGNOSIS — Z95810 Presence of automatic (implantable) cardiac defibrillator: Secondary | ICD-10-CM | POA: Diagnosis not present

## 2019-09-22 NOTE — Progress Notes (Signed)
EPIC Encounter for ICM Monitoring  Patient Name: Lori Beard is a 69 y.o. female Date: 09/22/2019 Primary Care Physican: [name], L.August Saucer, MD Primary Cardiologist:Nahser Electrophysiologist:Taylor LastWeight: 216lbs    Transmission reviewed and results sent via my chart.  Coruve thoracic impedance normal.  Prescribed: Furosemide20 mg take 1 tablet daily.  Labs: 09/14/2019 Creatinine 1.15, BUN 18, Potassium 4.3, Sodium 142, GFR 49-56  Recommendations:None  Follow-up plan: ICM clinic phone appointment on2/15/2021. 91 day device clinic remote transmission 11/15/2019.   Copy of ICM check sent to Dr.Taylor.   3 month ICM trend: 09/19/2019    1 Year ICM trend:       Karie Soda, RN 09/22/2019 8:54 AM

## 2019-09-27 ENCOUNTER — Other Ambulatory Visit: Payer: Self-pay | Admitting: Physician Assistant

## 2019-09-27 ENCOUNTER — Telehealth: Payer: Self-pay | Admitting: Cardiovascular Disease

## 2019-09-27 NOTE — Telephone Encounter (Signed)
New Message  We are recommending the COVID-19 vaccine to all of our patients. Cardiac medications (including blood thinners) should not deter anyone from being vaccinated and there is no need to hold any of those medications prior to vaccine administration.     Currently, there is a hotline to call (active 09/15/19) to schedule vaccination appointments as no walk-ins will be accepted.   Number: [phone].    If an appointment is not available please go to [email or web address] to sign up for notification when additional vaccine appointments are available.   If you have further questions or concerns about the vaccine process, please visit [email or web address] or contact your primary care physician.   

## 2019-09-28 ENCOUNTER — Other Ambulatory Visit: Payer: Self-pay | Admitting: Cardiovascular Disease

## 2019-10-23 ENCOUNTER — Ambulatory Visit (INDEPENDENT_AMBULATORY_CARE_PROVIDER_SITE_OTHER): Payer: Medicare Other

## 2019-10-23 DIAGNOSIS — I5022 Chronic systolic (congestive) heart failure: Secondary | ICD-10-CM | POA: Diagnosis not present

## 2019-10-23 DIAGNOSIS — Z95810 Presence of automatic (implantable) cardiac defibrillator: Secondary | ICD-10-CM | POA: Diagnosis not present

## 2019-10-25 ENCOUNTER — Telehealth: Payer: Self-pay

## 2019-10-25 NOTE — Telephone Encounter (Signed)
Remote ICM transmission received.  Attempted call to patient regarding ICM remote transmission and left message per DPR to return call.   

## 2019-10-25 NOTE — Progress Notes (Signed)
EPIC Encounter for ICM Monitoring  Patient Name: Lori Beard is a 69 y.o. female Date: 10/25/2019 Primary Care Physican: [name], L.August Saucer, MD Primary Cardiologist:Nahser Electrophysiologist:Taylor LastWeight: [phone]    Attempted call to patient and unable to reach.  Left message to return call. Transmission reviewed.   Coruve thoracic impedance suggesting possible fluid accumulation since 2/7/20201 but trending back close to baseline normal.  Prescribed: Furosemide20 mg take 1 tablet daily.  Labs: 09/14/2019 Creatinine 1.15, BUN 18, Potassium 4.3, Sodium 142, GFR 49-56 05/23/2019 Creatinine 1.28, BUN 17, Potassium 4.2, Sodium 143, GFR 43-50  Recommendations:Left voice mail with ICM number and encouraged to call if experiencing any fluid symptoms.  Follow-up plan: ICM clinic phone appointment on2/25/2021 to recheck fluid levels. 91 day device clinic remote transmission3/06/2020.   Copy of ICM check sent to Dr.Taylor and Dr Elease Hashimoto.  3 month ICM trend: 10/23/2019    1 Year ICM trend:       Karie Soda, RN 10/25/2019 2:13 PM

## 2019-10-27 NOTE — Telephone Encounter (Signed)
Follow up  ° ° °Patient is returning call.  °

## 2019-11-02 ENCOUNTER — Ambulatory Visit (INDEPENDENT_AMBULATORY_CARE_PROVIDER_SITE_OTHER): Payer: Medicare Other

## 2019-11-02 DIAGNOSIS — I5022 Chronic systolic (congestive) heart failure: Secondary | ICD-10-CM

## 2019-11-02 DIAGNOSIS — Z95810 Presence of automatic (implantable) cardiac defibrillator: Secondary | ICD-10-CM

## 2019-11-03 NOTE — Progress Notes (Signed)
EPIC Encounter for ICM Monitoring  Patient Name: Lori Beard is a 69 y.o. female Date: 11/03/2019 Primary Care Physican: [name], L.August Saucer, MD Primary Cardiologist:Nahser Electrophysiologist:Taylor 11/03/2019 Weight: [phone]    Spoke with patient and she is doing well.  She denies fluid symptoms.   Coruve thoracic impedance returned to normal.  Prescribed: Furosemide20 mg take 1 tablet daily.  Labs: 09/14/2019 Creatinine 1.15, BUN 18, Potassium 4.3, Sodium 142, GFR 49-56 05/23/2019 Creatinine 1.28, BUN 17, Potassium 4.2, Sodium 143, GFR 43-50  Recommendations:No changes and encouraged to call if experiencing any fluid symptoms.  Follow-up plan: ICM clinic phone appointment on3/22/2021. 91 day device clinic remote transmission3/06/2020.   Copy of ICM check sent to Dr.Taylor  3 month ICM trend: 10/31/2019     Karie Soda, RN 11/03/2019 9:24 AM

## 2019-11-15 ENCOUNTER — Ambulatory Visit (INDEPENDENT_AMBULATORY_CARE_PROVIDER_SITE_OTHER): Payer: Medicare Other | Admitting: *Deleted

## 2019-11-15 DIAGNOSIS — I5022 Chronic systolic (congestive) heart failure: Secondary | ICD-10-CM | POA: Diagnosis not present

## 2019-11-15 LAB — CUP PACEART REMOTE DEVICE CHECK
Battery Remaining Longevity: 53 mo
Battery Remaining Percentage: 51 %
Battery Voltage: 2.92 V
Brady Statistic RV Percent Paced: 1 %
Date Time Interrogation Session: 20210310020016
HighPow Impedance: 72 Ohm
HighPow Impedance: 72 Ohm
Implantable Lead Implant Date: 20141128
Implantable Lead Location: 753860
Implantable Lead Model: 7122
Implantable Pulse Generator Implant Date: 20141128
Lead Channel Impedance Value: 430 Ohm
Lead Channel Pacing Threshold Amplitude: 0.75 V
Lead Channel Pacing Threshold Pulse Width: 0.5 ms
Lead Channel Sensing Intrinsic Amplitude: 9.8 mV
Lead Channel Setting Pacing Amplitude: 2.5 V
Lead Channel Setting Pacing Pulse Width: 0.5 ms
Lead Channel Setting Sensing Sensitivity: 0.5 mV
Pulse Gen Serial Number: 7136396

## 2019-11-16 NOTE — Progress Notes (Signed)
ICD Remote  

## 2019-11-27 ENCOUNTER — Ambulatory Visit (INDEPENDENT_AMBULATORY_CARE_PROVIDER_SITE_OTHER): Payer: Medicare Other

## 2019-11-27 DIAGNOSIS — I5022 Chronic systolic (congestive) heart failure: Secondary | ICD-10-CM

## 2019-11-27 DIAGNOSIS — Z95810 Presence of automatic (implantable) cardiac defibrillator: Secondary | ICD-10-CM

## 2019-11-29 NOTE — Progress Notes (Signed)
EPIC Encounter for ICM Monitoring  Patient Name: Lori Beard is a 69 y.o. female Date: 11/29/2019 Primary Care Physican: [name], L.August Saucer, MD Primary Cardiologist:Nahser Electrophysiologist:Taylor 11/03/2019 Weight: [phone]    Spoke with patient and she is doing well.  She denies fluid symptoms. Advised to update DPR at 4/7 office visit due to current one has old cell number.   Coruve thoracic impedancenormal.  Prescribed: Furosemide20 mg take 1 tablet daily.  Labs: 09/14/2019 Creatinine 1.15, BUN 18, Potassium 4.3, Sodium 142, GFR 49-56 05/23/2019 Creatinine 1.28, BUN 17, Potassium 4.2, Sodium 143, GFR 43-50  Recommendations:No changes and encouraged to call if experiencing any fluid symptoms.  Follow-up plan: ICM clinic phone appointment on4/26/2021. 91 day device clinic remote transmission6/05/2020. Office visit with Dr [name] on 12/13/2019  Copy of ICM check sent to Dr.Taylor  3 month ICM trend: 11/27/2019    1 Year ICM trend:       Karie Soda, RN 11/29/2019 11:36 AM

## 2019-12-07 ENCOUNTER — Telehealth: Payer: Self-pay | Admitting: Cardiovascular Disease

## 2019-12-07 NOTE — Telephone Encounter (Signed)
Patient wanted to know if it is safe for her to go into a Sauna with her pacemaker.

## 2019-12-07 NOTE — Telephone Encounter (Signed)
LMOVM for patient to call DC back regarding her question. Direct number provided along with hours.

## 2019-12-08 NOTE — Telephone Encounter (Signed)
Returned patients phone call. Informed patient she can go into a sauna. Informed patient to call office back with any further questions or concerns.

## 2019-12-08 NOTE — Telephone Encounter (Signed)
Lori Beard is returning [employer]. Please advise.

## 2019-12-13 ENCOUNTER — Other Ambulatory Visit: Payer: Self-pay

## 2019-12-13 ENCOUNTER — Ambulatory Visit (INDEPENDENT_AMBULATORY_CARE_PROVIDER_SITE_OTHER): Payer: Medicare Other | Admitting: Internal Medicine

## 2019-12-13 ENCOUNTER — Other Ambulatory Visit: Payer: Self-pay | Admitting: Physician Assistant

## 2019-12-13 ENCOUNTER — Encounter: Payer: Self-pay | Admitting: Internal Medicine

## 2019-12-13 VITALS — BP 126/88 | HR 68 | Ht 63.0 in | Wt 210.0 lb

## 2019-12-13 DIAGNOSIS — Z95810 Presence of automatic (implantable) cardiac defibrillator: Secondary | ICD-10-CM

## 2019-12-13 DIAGNOSIS — I472 Ventricular tachycardia, unspecified: Secondary | ICD-10-CM

## 2019-12-13 DIAGNOSIS — I428 Other cardiomyopathies: Secondary | ICD-10-CM | POA: Diagnosis not present

## 2019-12-13 DIAGNOSIS — I5022 Chronic systolic (congestive) heart failure: Secondary | ICD-10-CM | POA: Diagnosis not present

## 2019-12-13 NOTE — Progress Notes (Signed)
HPI Lori Beard returns today for followup. She has a h/o non-ischemic CM, VF arrest, obesity and HTN. She has done well in the interim with no ICD therapies, no chest pain or sob. No edema. She admits to some dietary indiscretion.  Allergies  Allergen Reactions  . Iron Other (See Comments)    Gives patient migraine headaches when supplemented  . Ace Inhibitors Cough  . Morphine And Related Itching and Rash     Current Outpatient Medications  Medication Sig Dispense Refill  . amitriptyline (ELAVIL) 25 MG tablet Take 75 mg by mouth at bedtime.    [name] aspirin 81 MG tablet Take 1 tablet (81 mg total) by mouth every evening.    [name] atorvastatin (LIPITOR) 10 MG tablet TAKE 1 TABLET BY MOUTH AT BEDTIME 90 tablet 1  . cetirizine (ZYRTEC) 5 MG tablet Take 1 tablet (5 mg total) by mouth daily. 14 tablet 1  . furosemide (LASIX) 20 MG tablet Take 1 tablet (20 mg total) by mouth every morning. 180 tablet 3  . gabapentin (NEURONTIN) 300 MG capsule Take 1 capsule (300 mg total) by mouth 3 (three) times daily as needed for up to 10 days (Nerve pain and tingling). 30 capsule 0  . hydrALAZINE (APRESOLINE) 25 MG tablet Take 2 tablets (50 mg total) by mouth 2 (two) times a day. 90 tablet 1  . hydrocerin (EUCERIN) CREA Apply 1 application topically 2 (two) times daily. 454 g 0  . HYDROcodone-acetaminophen (NORCO) 7.5-325 MG tablet Take 1 tablet by mouth every 8 (eight) hours as needed for moderate pain or severe pain.   0  . isosorbide dinitrate (ISORDIL) 20 MG tablet Take 1 tablet by mouth twice daily 180 tablet 3  . losartan (COZAAR) 50 MG tablet Take 1 tablet (50 mg total) by mouth daily. 30 tablet 2  . meclizine (ANTIVERT) 25 MG tablet Take 1 tablet (25 mg total) by mouth 3 (three) times daily as needed for dizziness. 12 tablet 0  . ondansetron (ZOFRAN ODT) 4 MG disintegrating tablet 63m ODT q4 hours prn nausea/vomit 12 tablet 0  . pantoprazole (PROTONIX) 40 MG tablet Take 40 mg by mouth daily.      . potassium chloride SA (KLOR-CON) 20 MEQ tablet Take 2 tablets by mouth once daily 180 tablet 3  . triamcinolone ointment (KENALOG) 0.5 % Apply 1 application topically 2 (two) times daily. 30 g 0  . carvedilol (COREG) 25 MG tablet Take 1 tablet (25 mg total) by mouth 2 (two) times daily with a meal. 180 tablet 3   No current facility-administered medications for this visit.     Past Medical History:  Diagnosis Date  . Aneurysm of right iliac artery (HCC)   . Arthritis    ALL OVER  . Cardiac arrest ([place] 08/01/2013   Polymorphic VT  . Chronic systolic CHF (congestive heart failure) ([place]   . GERD (gastroesophageal reflux disease)   . Hypertension   . Morbid obesity ([place]    a. s/p lapband surgery 11/2012.  .Marland KitchenMyocardial infarction ([place] 08/06/13  . NICM (nonischemic cardiomyopathy) ([place]    Echocardiogram (08/01/13): Mild LVH, global HK, EF 40-45%, Gr 1 DD, MAC, mild RVE, mildly reduced RVSF, PASP 31-35.  LHC (08/02/13):  Normal coronary arteries, EF 35%, LVEDP 17.  . Pneumonia 08/01/2013  . Renal failure, acute ([place] 08/01/2013  . S/P implantation of automatic cardioverter/defibrillator (AICD)    07/2013 ([facility]  . Sleep apnea 02/15/2012   SLEEP STUDY IN EPIC -  MILD OSA-CPAP NOT RECOMMENDED-HOME OXYGEN SUGGESTED BECAUSE OF OXYGEN DESATS ON ROOM AIR.  [name] Ventricular tachycardia ([place])    a. R on T PVC during admission for pneumonia/NSTEMI => monomorphic VT=>VF=>defib; s/p ICD    ROS:   All systems reviewed and negative except as noted in the HPI.   Past Surgical History:  Procedure Laterality Date  . ABDOMINAL HYSTERECTOMY    . BREATH TEK H PYLORI  02/15/2012   Procedure: BREATH TEK H PYLORI;  Surgeon: Pedro Earls, MD;  Location: [facility];  Service: General;  Laterality: N/A;  745  . CARDIAC CATHETERIZATION  08/02/2013   Normal coronary arteries, EF 35%   . CHOLECYSTECTOMY    . IMPLANTABLE CARDIOVERTER DEFIBRILLATOR IMPLANT N/A 08/04/2013   Procedure: IMPLANTABLE  CARDIOVERTER DEFIBRILLATOR IMPLANT;  Surgeon: Evans Lance, MD;  Location: [facility] C[facility] LAB;  Service: Cardiovascular;  Laterality: N/A;  . JOINT REPLACEMENT     BILATERAL TOTAL HIP REPLACEMENTS  . KNEE ARTHROSCOPY    . LAPAROSCOPIC GASTRIC BANDING    . LEFT HEART CATHETERIZATION WITH CORONARY ANGIOGRAM N/A 08/02/2013   Procedure: LEFT HEART CATHETERIZATION WITH CORONARY ANGIOGRAM;  Surgeon: Wellington Hampshire, MD;  Location: [facility] CATH LAB;  Service: Cardiovascular;  Laterality: N/A;  . MESH APPLIED TO LAP PORT N/A 11/22/2012   Procedure: MESH APPLIED TO LAP PORT;  Surgeon: Pedro Earls, MD;  Location: [facility];  Service: General;  Laterality: N/A;  . PACEMAKER INSERTION   08/04/2013   St. [name] single chamber ICD     Family History  Problem Relation Age of Onset  . Kidney disease Mother   . Heart disease Father   . Hypertension Father   . Colon cancer Father   . Stroke Paternal Grandmother   . Hypertension Paternal Grandmother   . Breast cancer Sister   . Heart attack Neg Hx      Social History   Socioeconomic History  . Marital status: Married    Spouse name: Not on file  . Number of children: Not on file  . Years of education: Not on file  . Highest education level: Not on file  Occupational History  . Not on file  Tobacco Use  . Smoking status: Never Smoker  . Smokeless tobacco: Never Used  Substance and Sexual Activity  . Alcohol use: No  . Drug use: No  . Sexual activity: Not Currently  Other Topics Concern  . Not on file  Social History Narrative  . Not on file   Social Determinants of Health   Financial Resource Strain:   . Difficulty of Paying Living Expenses:   Food Insecurity:   . Worried About Charity fundraiser in the Last Year:   . Arboriculturist in the Last Year:   Transportation Needs:   . Film/video editor (Medical):   [name] Lack of Transportation (Non-Medical):   Physical Activity:   . Days of Exercise per Week:   . Minutes of Exercise per  Session:   Stress:   . Feeling of Stress :   Social Connections:   . Frequency of Communication with Friends and Family:   . Frequency of Social Gatherings with Friends and Family:   . Attends Religious Services:   . Active Member of Clubs or Organizations:   . Attends Archivist Meetings:   [name] Marital Status:   Intimate Partner Violence:   . Fear of Current or Ex-Partner:   . Emotionally Abused:   [name] Physically  Abused:   . Sexually Abused:      BP 126/88   Pulse 68   Ht _0  (1.6 m)   Wt 210 lb (95.3 kg)   SpO2 96%   BMI 37.20 kg/m   Physical Exam:  obese appearing 69 yo woman, NAD HEENT: Unremarkable Neck:  No JVD, no thyromegally Lymphatics:  No adenopathy Back:  No CVA tenderness Lungs:  Clear with no wheezes HEART:  Regular rate rhythm, no murmurs, no rubs, no clicks Abd:  soft, positive bowel sounds, no organomegally, no rebound, no guarding Ext:  2 plus pulses, no edema, no cyanosis, no clubbing Skin:  No rashes no nodules Neuro:  CN II through XII intact, motor grossly intact  DEVICE  Normal device function.  See PaceArt for details.   Assess/Plan: 1. VF - she has not had more arrhythmias.  2. ICD - her St. [name] single chamber ICD is working normally. 3. Chronic systolic heart failure - her symptoms are class 2. She will continue her current meds. 4. Obesity - I encouraged her to maintain a low sodium diet.   Mikle Bosworth.D.

## 2019-12-13 NOTE — Patient Instructions (Signed)

## 2019-12-22 ENCOUNTER — Other Ambulatory Visit: Payer: Self-pay | Admitting: Physician Assistant

## 2020-01-01 ENCOUNTER — Ambulatory Visit (INDEPENDENT_AMBULATORY_CARE_PROVIDER_SITE_OTHER): Payer: Medicare Other

## 2020-01-01 DIAGNOSIS — I5022 Chronic systolic (congestive) heart failure: Secondary | ICD-10-CM | POA: Diagnosis not present

## 2020-01-01 DIAGNOSIS — Z95810 Presence of automatic (implantable) cardiac defibrillator: Secondary | ICD-10-CM

## 2020-01-02 ENCOUNTER — Telehealth: Payer: Self-pay

## 2020-01-02 NOTE — Telephone Encounter (Signed)
Spoke with patient to remind of missed remote transmission 

## 2020-01-03 ENCOUNTER — Telehealth: Payer: Self-pay

## 2020-01-03 NOTE — Telephone Encounter (Signed)
Remote ICM transmission received.  Attempted call to patient regarding ICM remote transmission and no answer.  

## 2020-01-03 NOTE — Progress Notes (Signed)
EPIC Encounter for ICM Monitoring  Patient Name: Lori Beard is a 69 y.o. female Date: 01/03/2020 Primary Care Physican: [name], L.August Saucer, MD Primary Cardiologist:Nahser Electrophysiologist:Taylor 4/7/2021office Weight: 210lbs    Attempted call to patient and unable to reach. Transmission reviewed.   Coruve thoracic impedancenormal.  Prescribed: Furosemide20 mg take 1 tablet daily.  Labs: 09/14/2019 Creatinine 1.15, BUN 18, Potassium 4.3, Sodium 142, GFR 49-56 05/23/2019 Creatinine 1.28, BUN 17, Potassium 4.2, Sodium 143, GFR 43-50  Recommendations: Unable to reach.    Follow-up plan: ICM clinic phone appointment on6/09/2019. 91 day device clinic remote transmission6/05/2020.   Copy of ICM check sent to Dr.Taylor  3 month ICM trend: 01/02/2020    1 Year ICM trend:       Karie Soda, RN 01/03/2020 1:44 PM

## 2020-02-06 ENCOUNTER — Ambulatory Visit (INDEPENDENT_AMBULATORY_CARE_PROVIDER_SITE_OTHER): Payer: Medicare Other

## 2020-02-06 DIAGNOSIS — I5022 Chronic systolic (congestive) heart failure: Secondary | ICD-10-CM

## 2020-02-06 DIAGNOSIS — Z95810 Presence of automatic (implantable) cardiac defibrillator: Secondary | ICD-10-CM | POA: Diagnosis not present

## 2020-02-07 NOTE — Progress Notes (Signed)
EPIC Encounter for ICM Monitoring  Patient Name: Lori Beard is a 69 y.o. female Date: 02/07/2020 Primary Care Physican: [name], L.August Saucer, MD Primary Cardiologist:Nahser Electrophysiologist:Taylor 4/7/2021office Weight: 210lbs    Transmission reviewed.   Coruve thoracic impedancenormal.  Prescribed: Furosemide20 mg take 1 tablet daily.  Labs: 09/14/2019 Creatinine 1.15, BUN 18, Potassium 4.3, Sodium 142, GFR 49-56 05/23/2019 Creatinine 1.28, BUN 17, Potassium 4.2, Sodium 143, GFR 43-50  Recommendations: None   Follow-up plan: ICM clinic phone appointment on 03/12/2020. 91 day device clinic remote transmission6/05/2020.   Copy of ICM check sent to Dr.Taylor  3 month ICM trend: 02/06/2020    1 Year ICM trend:       Karie Soda, RN 02/07/2020 2:24 PM

## 2020-02-14 ENCOUNTER — Ambulatory Visit (INDEPENDENT_AMBULATORY_CARE_PROVIDER_SITE_OTHER): Payer: Medicare Other | Admitting: *Deleted

## 2020-02-14 DIAGNOSIS — I428 Other cardiomyopathies: Secondary | ICD-10-CM

## 2020-02-15 ENCOUNTER — Telehealth: Payer: Self-pay

## 2020-02-15 NOTE — Telephone Encounter (Signed)
Spoke with patient to remind of missed remote transmission 

## 2020-02-16 LAB — CUP PACEART REMOTE DEVICE CHECK
Battery Remaining Longevity: 50 mo
Battery Remaining Percentage: 49 %
Battery Voltage: 2.92 V
Brady Statistic RV Percent Paced: 1 %
Date Time Interrogation Session: 20210610182722
HighPow Impedance: 73 Ohm
HighPow Impedance: 73 Ohm
Implantable Lead Implant Date: 20141128
Implantable Lead Location: 753860
Implantable Lead Model: 7122
Implantable Pulse Generator Implant Date: 20141128
Lead Channel Impedance Value: 410 Ohm
Lead Channel Pacing Threshold Amplitude: 0.75 V
Lead Channel Pacing Threshold Pulse Width: 0.5 ms
Lead Channel Sensing Intrinsic Amplitude: 10.1 mV
Lead Channel Setting Pacing Amplitude: 2.5 V
Lead Channel Setting Pacing Pulse Width: 0.5 ms
Lead Channel Setting Sensing Sensitivity: 0.5 mV
Pulse Gen Serial Number: 7136396

## 2020-02-19 NOTE — Progress Notes (Signed)
Remote ICD transmission.   

## 2020-03-12 ENCOUNTER — Other Ambulatory Visit: Payer: Self-pay | Admitting: Cardiovascular Disease

## 2020-03-25 ENCOUNTER — Other Ambulatory Visit: Payer: Self-pay | Admitting: Cardiovascular Disease

## 2020-03-25 NOTE — Progress Notes (Signed)
No ICM remote transmission received for 03/12/2020 and next ICM transmission scheduled for 04/15/2020.   

## 2020-04-08 LAB — CUP PACEART INCLINIC DEVICE CHECK
Brady Statistic RV Percent Paced: 1 % — CL
Date Time Interrogation Session: 20210407112514
Implantable Lead Implant Date: 20141128
Implantable Lead Location: 753860
Implantable Lead Model: 7122
Implantable Pulse Generator Implant Date: 20141128
Lead Channel Pacing Threshold Amplitude: 0.75 V
Lead Channel Pacing Threshold Pulse Width: 0.5 ms
Lead Channel Sensing Intrinsic Amplitude: 10.7 mV
Pulse Gen Serial Number: 7136396

## 2020-04-17 ENCOUNTER — Telehealth: Payer: Self-pay

## 2020-04-17 NOTE — Telephone Encounter (Signed)
LMOVM for pt to send missed ICM transmission.  

## 2020-04-19 NOTE — Progress Notes (Signed)
No ICM remote transmission received for 04/17/2020 and next ICM transmission scheduled for 05/06/2020.   

## 2020-05-09 ENCOUNTER — Telehealth: Payer: Self-pay

## 2020-05-09 NOTE — Telephone Encounter (Signed)
LMOVM for to send missed ICM transmission.

## 2020-05-15 ENCOUNTER — Ambulatory Visit: Payer: Medicare Other

## 2020-05-24 LAB — CUP PACEART REMOTE DEVICE CHECK
Battery Remaining Longevity: 48 mo
Battery Remaining Percentage: 47 %
Battery Voltage: 2.89 V
Brady Statistic RV Percent Paced: 1 %
Date Time Interrogation Session: 20210910170059
HighPow Impedance: 73 Ohm
HighPow Impedance: 73 Ohm
Implantable Lead Implant Date: 20141128
Implantable Lead Location: 753860
Implantable Lead Model: 7122
Implantable Pulse Generator Implant Date: 20141128
Lead Channel Impedance Value: 430 Ohm
Lead Channel Pacing Threshold Amplitude: 0.75 V
Lead Channel Pacing Threshold Pulse Width: 0.5 ms
Lead Channel Sensing Intrinsic Amplitude: 9 mV
Lead Channel Setting Pacing Amplitude: 2.5 V
Lead Channel Setting Pacing Pulse Width: 0.5 ms
Lead Channel Setting Sensing Sensitivity: 0.5 mV
Pulse Gen Serial Number: 7136396

## 2020-05-27 NOTE — Progress Notes (Signed)
No ICM remote transmission received for 05/20/2020 and next ICM transmission scheduled for 07/02/2020.   

## 2020-06-11 ENCOUNTER — Other Ambulatory Visit: Payer: Self-pay | Admitting: Cardiovascular Disease

## 2020-07-02 ENCOUNTER — Ambulatory Visit (INDEPENDENT_AMBULATORY_CARE_PROVIDER_SITE_OTHER): Payer: Medicare Other

## 2020-07-02 DIAGNOSIS — I5022 Chronic systolic (congestive) heart failure: Secondary | ICD-10-CM | POA: Diagnosis not present

## 2020-07-02 DIAGNOSIS — Z95810 Presence of automatic (implantable) cardiac defibrillator: Secondary | ICD-10-CM

## 2020-07-06 ENCOUNTER — Ambulatory Visit: Payer: Medicare Other | Attending: Internal Medicine

## 2020-07-06 DIAGNOSIS — Z23 Encounter for immunization: Secondary | ICD-10-CM

## 2020-07-06 NOTE — Progress Notes (Signed)
° °  Covid-19 Vaccination Clinic  Name:  Caria Transue    MRN: [id number] DOB: Sep 25, 1950  07/06/2020  Ms. Bessinger was observed post Covid-19 immunization for 15 minutes without incident. She was provided with Vaccine Information Sheet and instruction to access the V-Safe system.   Ms. Dorner was instructed to call 911 with any severe reactions post vaccine:  Difficulty breathing   Swelling of face and throat   A fast heartbeat   A bad rash all over body   Dizziness and weakness

## 2020-07-08 NOTE — Progress Notes (Signed)
EPIC Encounter for ICM Monitoring  Patient Name: Chianti Goh is a 69 y.o. female Date: 07/08/2020 Primary Care Physican: [name], L.August Saucer, MD Primary Cardiologist:Nahser Electrophysiologist:Taylor Last officeWeight: 210lbs    Transmission reviewed.  Coruve thoracic impedancenormal.  Prescribed: Furosemide20 mg take 1 tablet daily.  Labs: 09/14/2019 Creatinine 1.15, BUN 18, Potassium 4.3, Sodium 142, GFR 49-56 05/23/2019 Creatinine 1.28, BUN 17, Potassium 4.2, Sodium 143, GFR 43-50  Recommendations: None  Follow-up plan: ICM clinic phone appointment on 08/06/2020. 91 day device clinic remote transmission12/04/2020.   Copy of ICM check sent to Dr.Taylor.   3 month ICM trend: 07/02/2020    1 Year ICM trend:       Karie Soda, RN 07/08/2020 3:06 PM

## 2020-07-15 ENCOUNTER — Other Ambulatory Visit: Payer: Self-pay | Admitting: Cardiovascular Disease

## 2020-07-24 ENCOUNTER — Other Ambulatory Visit: Payer: Self-pay

## 2020-07-24 DIAGNOSIS — R202 Paresthesia of skin: Secondary | ICD-10-CM

## 2020-07-30 ENCOUNTER — Other Ambulatory Visit: Payer: Self-pay

## 2020-07-30 ENCOUNTER — Ambulatory Visit: Payer: Medicare Other | Admitting: Neurology

## 2020-07-30 DIAGNOSIS — G5602 Carpal tunnel syndrome, left upper limb: Secondary | ICD-10-CM

## 2020-07-30 DIAGNOSIS — R202 Paresthesia of skin: Secondary | ICD-10-CM | POA: Diagnosis not present

## 2020-07-30 DIAGNOSIS — G5622 Lesion of ulnar nerve, left upper limb: Secondary | ICD-10-CM

## 2020-07-30 NOTE — Procedures (Signed)
[facility]  [address], [address]  [place], [place] [address] Tel: [phone] Fax:  [phone] Test Date:  07/30/2020  Patient: Lori Beard DOB: 13-Dec-1950 Physician: Nita Sickle, DO  Sex: Female Height: 5\' 3"  Ref Phys: , MD  ID#: Lupe Carney   Technician:    Patient Complaints: This is a 69 year old female referred for evaluation of left hand numbness, tingling, and weakness.    NCV & EMG Findings: Extensive electrodiagnostic testing of the left upper extremity shows:  1. Left median sensory response shows prolonged latency (5.4 ms).  Left ulnar sensory response is absent.  Left radial sensory responses within normal limits. 2. Left median motor response shows prolonged latency (4.5 ms).  Left ulnar motor response is unobtainable at the abductor digiti minimi and first dorsal interosseous muscles.  3. Despite maximal activation, no motor unit recruitment was seen in the first dorsal interosseous and abductor digiti minimi muscles.  There is evidence of fibrillation potential in these muscles as well as active on chronic findings in the left flexor carpi ulnaris muscle.   Impression: 1. Left ulnar neuropathy, which can be localized proximal to the takeoff to the flexor carpi ulnaris muscle.  Overall, these findings are very severe in degree electrically. 2. Left median neuropathy at or distal to the wrist (moderate), consistent with a clinical diagnosis of carpal tunnel syndrome.     ___________________________ 65, DO    Nerve Conduction Studies Anti Sensory Summary Table   Stim Site NR Peak (ms) Norm Peak (ms) P-T Amp (V) Norm P-T Amp  Left Median Anti Sensory (2nd Digit)  32C  Wrist    5.4 <3.8 13.8 >10  Left Radial Anti Sensory (Base 1st Digit)  32C  Site 2    2.8  19.6   Left Ulnar Anti Sensory (5th Digit)  32C  Wrist NR  <3.2  >5   Motor Summary Table   Stim Site NR Onset (ms) Norm Onset (ms) O-P Amp (mV) Norm O-P Amp  Site1 Site2 Delta-0 (ms) Dist (cm) Vel (m/s) Norm Vel (m/s)  Left Median Motor (Abd Poll Brev)  32C  Wrist    4.5 <4.0 11.1 >5 Elbow Wrist 5.3 27.0 51 >50  Elbow    9.8  10.6         Left Ulnar Motor (Abd Dig Minimi)  32C  Wrist NR  <3.1  >7 B Elbow Wrist  0.0  >50  B Elbow NR     A Elbow B Elbow  0.0  >50  A Elbow NR            Left Ulnar (FDI) Motor (1st DI)  32C  Wrist NR  <4.5  >7 B Elbow Wrist  0.0  >50  B Elbow NR     A Elbow B Elbow  0.0  >50  A Elbow NR             EMG   Side Muscle Ins Act Fibs Psw Fasc Number Recrt Dur Dur. Amp Amp. Poly Poly. Comment  Left 1stDorInt Nml 1+ Nml Nml NE None - - - - All - ATR  Left Abd Poll Brev Nml Nml Nml Nml Nml Nml Nml Nml Nml Nml Nml Nml N/A  Left PronatorTeres Nml Nml Nml Nml Nml Nml Nml Nml Nml Nml Nml Nml N/A  Left Biceps Nml Nml Nml Nml Nml Nml Nml Nml Nml Nml Nml Nml N/A  Left Triceps Nml Nml Nml Nml Nml Nml Nml Nml  Nml Nml Nml Nml N/A  Left Deltoid Nml Nml Nml Nml Nml Nml Nml Nml Nml Nml Nml Nml N/A  Left ABD Dig Min Nml 1+ Nml Nml NE None - - - - - - ATR  Left FlexCarpiUln Nml 1+ Nml Nml 2- Rapid Some 1+ Some 1+ Some 1+ N/A      Waveforms:

## 2020-08-06 ENCOUNTER — Ambulatory Visit (INDEPENDENT_AMBULATORY_CARE_PROVIDER_SITE_OTHER): Payer: Medicare Other

## 2020-08-06 DIAGNOSIS — I5022 Chronic systolic (congestive) heart failure: Secondary | ICD-10-CM

## 2020-08-06 DIAGNOSIS — Z95810 Presence of automatic (implantable) cardiac defibrillator: Secondary | ICD-10-CM | POA: Diagnosis not present

## 2020-08-09 NOTE — Progress Notes (Signed)
EPIC Encounter for ICM Monitoring  Patient Name: Lori Beard is a 69 y.o. female Date: 08/09/2020 Primary Care Physican: [name], L.August Saucer, MD Primary Cardiologist:Nahser Electrophysiologist:Taylor Last officeWeight: 210lbs    Transmission reviewed.  Coruve thoracic impedancenormal.  Prescribed:   Furosemide20 mg take 1 tablet daily.  Potassium 20 mEq take 2 tablets daily.  Labs: 05/28/2020 Creatinine 1.37, BUN 17, Potassium 4.2, Sodium 143, GFR 38-46 09/14/2019 Creatinine 1.15, BUN 18, Potassium 4.3, Sodium 142, GFR 49-56 05/23/2019 Creatinine 1.28, BUN 17, Potassium 4.2, Sodium 143, GFR 43-50  Recommendations: None  Follow-up plan: ICM clinic phone appointment on1/11/2020. 91 day device clinic remote transmission12/04/2020.   Copy of ICM check sent to Dr.Taylor.    3 month ICM trend: 08/06/2020    1 Year ICM trend:       Karie Soda, RN 08/09/2020 3:09 PM

## 2020-08-13 ENCOUNTER — Other Ambulatory Visit: Payer: Self-pay | Admitting: Cardiovascular Disease

## 2020-08-13 ENCOUNTER — Other Ambulatory Visit: Payer: Self-pay | Admitting: Physician Assistant

## 2020-08-13 DIAGNOSIS — I2510 Atherosclerotic heart disease of native coronary artery without angina pectoris: Secondary | ICD-10-CM

## 2020-08-14 ENCOUNTER — Ambulatory Visit (INDEPENDENT_AMBULATORY_CARE_PROVIDER_SITE_OTHER): Payer: Medicare Other

## 2020-08-14 DIAGNOSIS — I428 Other cardiomyopathies: Secondary | ICD-10-CM

## 2020-08-16 LAB — CUP PACEART REMOTE DEVICE CHECK
Battery Remaining Longevity: 47 mo
Battery Remaining Percentage: 45 %
Battery Voltage: 2.9 V
Brady Statistic RV Percent Paced: 1 %
Date Time Interrogation Session: 20211208020024
HighPow Impedance: 68 Ohm
HighPow Impedance: 68 Ohm
Implantable Lead Implant Date: 20141128
Implantable Lead Location: 753860
Implantable Lead Model: 7122
Implantable Pulse Generator Implant Date: 20141128
Lead Channel Impedance Value: 490 Ohm
Lead Channel Pacing Threshold Amplitude: 0.75 V
Lead Channel Pacing Threshold Pulse Width: 0.5 ms
Lead Channel Sensing Intrinsic Amplitude: 10.2 mV
Lead Channel Setting Pacing Amplitude: 2.5 V
Lead Channel Setting Pacing Pulse Width: 0.5 ms
Lead Channel Setting Sensing Sensitivity: 0.5 mV
Pulse Gen Serial Number: 7136396

## 2020-08-27 NOTE — Progress Notes (Signed)
Remote ICD transmission.   

## 2020-09-09 ENCOUNTER — Other Ambulatory Visit: Payer: Self-pay | Admitting: Internal Medicine

## 2020-09-09 ENCOUNTER — Other Ambulatory Visit: Payer: Self-pay | Admitting: Cardiovascular Disease

## 2020-09-09 DIAGNOSIS — I2510 Atherosclerotic heart disease of native coronary artery without angina pectoris: Secondary | ICD-10-CM

## 2020-09-12 DIAGNOSIS — G5622 Lesion of ulnar nerve, left upper limb: Secondary | ICD-10-CM | POA: Diagnosis not present

## 2020-09-12 DIAGNOSIS — G5602 Carpal tunnel syndrome, left upper limb: Secondary | ICD-10-CM | POA: Diagnosis not present

## 2020-09-20 NOTE — Progress Notes (Signed)
No ICM remote transmission received for 09/09/2020 and next ICM transmission scheduled for 10/28/2020.

## 2020-10-17 ENCOUNTER — Other Ambulatory Visit: Payer: Self-pay | Admitting: Physician Assistant

## 2020-10-17 ENCOUNTER — Other Ambulatory Visit: Payer: Self-pay | Admitting: Cardiovascular Disease

## 2020-10-18 ENCOUNTER — Telehealth: Payer: Self-pay | Admitting: Cardiovascular Disease

## 2020-10-18 NOTE — Telephone Encounter (Signed)
Called pt to inform her that her medications were already sent to her pharmacy as requested and if she has any other problems, questions or concerns, to give our office a call. Pt verbalized understanding.

## 2020-10-18 NOTE — Telephone Encounter (Signed)
*  STAT* If patient is at the pharmacy, call can be transferred to refill team.   1. Which medications need to be refilled? (please list name of each medication and dose if known)  atorvastatin (LIPITOR) 10 MG tablet furosemide (LASIX) 20 MG tablet  2. Which pharmacy/location (including street and city if local pharmacy) is medication to be sent to? [facility] - [place], [place] - [address]  3. Do they need a 30 day or 90 day supply? 30   Patient is scheduled for an appointment 10/23/20

## 2020-10-22 ENCOUNTER — Encounter: Payer: Self-pay | Admitting: Cardiovascular Disease

## 2020-10-22 NOTE — Progress Notes (Signed)
Cardiology Office Note   Date:  10/23/2020   ID:  Aevah, Stansbery 07/15/1951, MRN [id number]  PCP:  Alroy Dust, Carlean Jews.Marlou Sa, MD  Cardiologist:   Mertie Moores, MD   Chief Complaint  Patient presents with  . Coronary Artery Disease  . Hyperlipidemia  . Congestive Heart Failure             Ramonita Koenig Lennox is a 70 y.o. female who presents for   1. Chronic systolic congestive heart failure 2. Hypertension 3 .obesty - s/p lap band surgery 4. coronary artery disease-status post non-ST segment elevation myocardial infarction complicated by ventricular tachycardia 5. Status post ICD placement 08/04/13   Previous notes Notes from [place] - 09/04/14:  KALEA PERINE is a 70 y.o. female with a hx of HTN, obesity s/p LAP-BAND surgery and sleep apnea. She was admitted 07/2013 with pneumonia. She ruled in for a NSTEMI. Her hospitalization was complicated by monomorphic VTach that progressed to VFib due to R on T PVC in the setting of hypokalemia with a K of 2.9. Echocardiogram (08/01/13): Mild LVH, global HK, EF 40-45%, Gr 1 DD, MAC, mild RVE, mildly reduced RVSF, PASP 31-35. LHC (08/02/13): Normal coronary arteries, EF 35%, LVEDP 17. She was seen by EP and underwent ICD implantation for secondary prevention of malignant ventricular arrhythmias in the setting of nonischemic cardiomyopathy. She underwent St. [name] single-chamber defibrillator implantation by Dr. Lovena Le 08/04/13.  I saw her 08/11/13. She had resumed verapamil and metoprolol due to high blood pressures. I stopped these medications and placed on losartan and increased her BiDil to 3x per day.   Doing well since last seen. The patient denies chest pain, shortness of breath, syncope, orthopnea, PND or significant pedal edema. No ICD shocks.   Jan. 30, 2015:  Mrs. Farrel presents today for followup evaluation of her hypertension and chronic systolic congestive heart failure. Her left ventricular ejection  fraction is approximate 35%. She has a history of a cardiac arrest and has had an ICD placed. She sees Dr. Cristopher Peru for management of that.  12/27/2013:  Doing well  Oct. 26, 2015:  She is only taking the BiDil twice a day. He causes profound weakness and dizziness. She was originally written to take it 3 times a day but she's not able to take it twice a day.   . Even still, he causes her to be very weak and she has to lie down after taking it for the next 30 minutes to one hour. Otherwise she's doing well. He's not having any profound shortness of breath. No chest pain  Jan. 26, 2016:  Doing well.  No cp or dyspnea.   Is on hydralazine and ISDN instead of BiDIl now.    Jan. 19, 2017:  Wafa is doing well. No CP or dyspnea.  Has CAD - hx of MI Has CHF and and ICD .   June 07, 2018: Aprill is seen today for follow-up visit of her coronary artery disease and Chronic systolic congestive heart failure.  Last echocardiogram was May 2019 which reveals left ventricular ejection fraction of 50%. She has mild pulmonary artery hypertension with an estimated PA pressure of 38 mmHg.  Had a "hot sensation" yesterday  Had numbness in her left arm and left fingers.  Not associated with any chest pain .  Went to ER.  She was started on gabapentin which seems to have helped.  Walks frequently .   No CP or dyspnea.  Sept. 15, 2020  Dollie is seen today for CAD and CHF Doing well.   Needs to restart her exercise No significant CP  Has ICD  Last echocardiogram from May 2019 reveals normal left ventricular systolic function with an ejection fraction of 50 to 55%.  Feb. 16, 2022:  Lori Beard is seen today for CAD and CHF Wt is 201 lbs ( down 15 lbs from her previous visit with me )  No cp or dyspnea.    Has a single chanber ICD for hx of VF.[name]  Able to do some exercise    Past Medical History:  Diagnosis Date  . Aneurysm of right iliac artery (HCC)   . Arthritis    ALL OVER  .  Cardiac arrest ([place]) 08/01/2013   Polymorphic VT  . Chronic systolic CHF (congestive heart failure) ([place])   . GERD (gastroesophageal reflux disease)   . Hypertension   . Morbid obesity ([place])    a. s/p lapband surgery 11/2012.  [name] Myocardial infarction ([place]) 08/06/13  . NICM (nonischemic cardiomyopathy) ([place])    Echocardiogram (08/01/13): Mild LVH, global HK, EF 40-45%, Gr 1 DD, MAC, mild RVE, mildly reduced RVSF, PASP 31-35.  LHC (08/02/13):  Normal coronary arteries, EF 35%, LVEDP 17.  . Pneumonia 08/01/2013  . Renal failure, acute ([place]) 08/01/2013  . S/P implantation of automatic cardioverter/defibrillator (AICD)    07/2013 Lovena Le)  . Sleep apnea 02/15/2012   SLEEP STUDY IN EPIC - MILD OSA-CPAP NOT RECOMMENDED-HOME OXYGEN SUGGESTED BECAUSE OF OXYGEN DESATS ON ROOM AIR.  [name] Ventricular tachycardia (HCC)    a. R on T PVC during admission for pneumonia/NSTEMI => monomorphic VT=>VF=>defib; s/p ICD    Past Surgical History:  Procedure Laterality Date  . ABDOMINAL HYSTERECTOMY    . BREATH TEK H PYLORI  02/15/2012   Procedure: BREATH TEK H PYLORI;  Surgeon: Pedro Earls, MD;  Location: [facility];  Service: General;  Laterality: N/A;  745  . CARDIAC CATHETERIZATION  08/02/2013   Normal coronary arteries, EF 35%   . CHOLECYSTECTOMY    . IMPLANTABLE CARDIOVERTER DEFIBRILLATOR IMPLANT N/A 08/04/2013   Procedure: IMPLANTABLE CARDIOVERTER DEFIBRILLATOR IMPLANT;  Surgeon: Evans Lance, MD;  Location: [facility] CATH LAB;  Service: Cardiovascular;  Laterality: N/A;  . JOINT REPLACEMENT     BILATERAL TOTAL HIP REPLACEMENTS  . KNEE ARTHROSCOPY    . LAPAROSCOPIC GASTRIC BANDING    . LEFT HEART CATHETERIZATION WITH CORONARY ANGIOGRAM N/A 08/02/2013   Procedure: LEFT HEART CATHETERIZATION WITH CORONARY ANGIOGRAM;  Surgeon: Wellington Hampshire, MD;  Location: [facility] CATH LAB;  Service: Cardiovascular;  Laterality: N/A;  . MESH APPLIED TO LAP PORT N/A 11/22/2012   Procedure: MESH APPLIED TO LAP PORT;  Surgeon:  Pedro Earls, MD;  Location: [facility] ORS;  Service: General;  Laterality: N/A;  . PACEMAKER INSERTION   08/04/2013   St. [name] single chamber ICD     Current Outpatient Medications  Medication Sig Dispense Refill  . amitriptyline (ELAVIL) 25 MG tablet Take 75 mg by mouth at bedtime.    [name] aspirin 81 MG tablet Take 1 tablet (81 mg total) by mouth every evening.    [name] atorvastatin (LIPITOR) 10 MG tablet Take 1 tablet (10 mg total) by mouth daily. FOLLOW UP DUE IN April 2022. PLEASE CALL AND SCHEDULE 30 tablet 3  . carvedilol (COREG) 25 MG tablet TAKE 1 TABLET BY MOUTH TWICE DAILY WITH A MEAL 180 tablet 3  . cetirizine (ZYRTEC) 5 MG tablet Take 1 tablet (5  mg total) by mouth daily. 14 tablet 1  . furosemide (LASIX) 20 MG tablet TAKE 1 TABLET BY MOUTH ONCE DAILY IN THE MORNING. FOLLOW UP DUE IN MARCH 2022. PLEASE SCHEDULE 90 tablet 0  . gabapentin (NEURONTIN) 300 MG capsule Take 1 capsule (300 mg total) by mouth 3 (three) times daily as needed for up to 10 days (Nerve pain and tingling). 30 capsule 0  . hydrALAZINE (APRESOLINE) 25 MG tablet TAKE 1 TABLET BY MOUTH THREE TIMES DAILY 270 tablet 0  . hydrocerin (EUCERIN) CREA Apply 1 application topically 2 (two) times daily. 454 g 0  . HYDROcodone-acetaminophen (NORCO) 7.5-325 MG tablet Take 1 tablet by mouth every 8 (eight) hours as needed for moderate pain or severe pain.   0  . isosorbide dinitrate (ISORDIL) 20 MG tablet Take 1 tablet (20 mg total) by mouth 2 (two) times daily. Please make overdue appt with Dr. Acie Fredrickson before anymore refills. Thank you 2nd attempt 30 tablet 0  . losartan (COZAAR) 50 MG tablet Take 1 tablet (50 mg total) by mouth daily. 30 tablet 2  . meclizine (ANTIVERT) 25 MG tablet Take 1 tablet (25 mg total) by mouth 3 (three) times daily as needed for dizziness. 12 tablet 0  . ondansetron (ZOFRAN ODT) 4 MG disintegrating tablet 24m ODT q4 hours prn nausea/vomit 12 tablet 0  . pantoprazole (PROTONIX) 40 MG tablet Take 40 mg by mouth  daily.    . potassium chloride SA (KLOR-CON) 20 MEQ tablet Take 2 tablets (40 mEq total) by mouth daily. Please make overdue appt with Dr. NAcie Fredricksonbefore anymore refills. Thank you 2nd attempt 30 tablet 0  . triamcinolone ointment (KENALOG) 0.5 % Apply 1 application topically 2 (two) times daily. 30 g 0  . vitamin B-12 (CYANOCOBALAMIN) 100 MCG tablet Take 1 tablet by mouth as needed.     No current facility-administered medications for this visit.    Allergies:   Iron, Ace inhibitors, and Morphine and related    Social History:  The patient  reports that she has never smoked. She has never used smokeless tobacco. She reports that she does not drink alcohol and does not use drugs.   Family History:  The patient's family history includes Breast cancer in her sister; Colon cancer in her father; Heart disease in her father; Hypertension in her father and paternal grandmother; Kidney disease in her mother; Stroke in her paternal grandmother.    ROS:  Please see the history of present illness.   Otherwise, review of systems are positive for none.   All other systems are reviewed and negative.   Physical Exam: Blood pressure 126/80, pulse 70, height _0  (1.6 m), weight 201 lb 6.4 oz (91.4 kg), SpO2 93 %.  GEN:  Well nourished, well developed in no acute distress HEENT: Normal NECK: No JVD; No carotid bruits LYMPHATICS: No lymphadenopathy CARDIAC: RRR, no murmurs, rubs, gallops RESPIRATORY:  Clear to auscultation without rales, wheezing or rhonchi  ABDOMEN: Soft, non-tender, non-distended MUSCULOSKELETAL:  No edema; No deformity  SKIN: Warm and dry NEUROLOGIC:  Alert and oriented x 3    EKG:    October 23, 2018: Normal sinus rhythm at 70.  No ST or T wave changes.  Recent Labs: No results found for requested labs within last 8760 hours.    Lipid Panel    Component Value Date/Time   CHOL 110 05/23/2019 1100   TRIG 74 05/23/2019 1100   HDL 44 05/23/2019 1100   CHOLHDL 2.5  05/23/2019  1100   CHOLHDL 3.0 09/26/2015 1558   VLDL 17 09/26/2015 1558   LDLCALC 51 05/23/2019 1100      Wt Readings from Last 3 Encounters:  10/23/20 201 lb 6.4 oz (91.4 kg)  12/13/19 210 lb (95.3 kg)  05/23/19 216 lb 12.8 oz (98.3 kg)      Other studies Reviewed: Additional studies/ records that were reviewed today include: . Review of the above records demonstrates:    ASSESSMENT AND PLAN:  1. Chronic systolic congestive heart failure :  Doing well .  Cont current meds.  She has class II CHF . Encouraged her to exercise and to continue with her weight loss  2. Hypertension-  BP is well controlled.   3 .obesty -    Has lost 15 lbs since I last saw her.  Cont weight loss efforts.  4. coronary artery disease-status post non-ST segment elevation myocardial infarction complicated by ventricular tachycardia -  No current angina   .  5. Status post ICD placement 08/04/13 - -managed by EP..   Current medicines are reviewed at length with the patient today.  The patient does not have concerns regarding medicines.  The following changes have been made:  no change  Labs/ tests ordered today include: BMP     Orders Placed This Encounter  Procedures  . ALT  . Basic metabolic panel  . Lipid panel  . EKG 12-Lead   Disposition:  Will have her see an APP in 6 months with lipids, ALT, BMP.  I'll see in 1 year.   Signed, Mertie Moores, MD  10/23/2020 10:33 AM    [facility] [address], [place], [place]  [address] Phone: [phone]; Fax: [phone]

## 2020-10-23 ENCOUNTER — Ambulatory Visit: Payer: Medicare Other | Admitting: Cardiovascular Disease

## 2020-10-23 ENCOUNTER — Other Ambulatory Visit: Payer: Self-pay

## 2020-10-23 ENCOUNTER — Encounter: Payer: Self-pay | Admitting: Cardiovascular Disease

## 2020-10-23 VITALS — BP 126/80 | HR 70 | Ht 63.0 in | Wt 201.4 lb

## 2020-10-23 DIAGNOSIS — I2510 Atherosclerotic heart disease of native coronary artery without angina pectoris: Secondary | ICD-10-CM | POA: Diagnosis not present

## 2020-10-23 DIAGNOSIS — I5022 Chronic systolic (congestive) heart failure: Secondary | ICD-10-CM | POA: Diagnosis not present

## 2020-10-23 NOTE — Patient Instructions (Signed)
Medication Instructions:  Your physician recommends that you continue on your current medications as directed. Please refer to the Current Medication list given to you today.  *If you need a refill on your cardiac medications before your next appointment, please call your pharmacy*   Lab Work: Your physician recommends that you return for lab work in: 6 months on the day of or a few days before your office visit with Dr. Elease Hashimoto.  You will need to FAST for this appointment - nothing to eat or drink after midnight the night before except water.   Testing/Procedures: none   Follow-Up: At [facility], you and your health needs are our priority.  As part of our continuing mission to provide you with exceptional heart care, we have created designated Provider Care Teams.  These Care Teams include your primary Cardiologist (physician) and Advanced Practice Providers (APPs -  Physician Assistants and Nurse Practitioners) who all work together to provide you with the care you need, when you need it.  We recommend signing up for the patient portal called "MyChart".  Sign up information is provided on this After Visit Summary.  MyChart is used to connect with patients for Virtual Visits (Telemedicine).  Patients are able to view lab/test results, encounter notes, upcoming appointments, etc.  Non-urgent messages can be sent to your provider as well.   To learn more about what you can do with MyChart, go to [email or web address].    Your next appointment:   6 month(s)  The format for your next appointment:   In Person  Provider:   You will see one of the following Advanced Practice Providers on your designated Care Team:    Tereso Newcomer, PA-C  [place], [place]

## 2020-10-28 ENCOUNTER — Ambulatory Visit (INDEPENDENT_AMBULATORY_CARE_PROVIDER_SITE_OTHER): Payer: Medicare Other

## 2020-10-28 DIAGNOSIS — Z95810 Presence of automatic (implantable) cardiac defibrillator: Secondary | ICD-10-CM

## 2020-10-28 DIAGNOSIS — I5022 Chronic systolic (congestive) heart failure: Secondary | ICD-10-CM | POA: Diagnosis not present

## 2020-11-01 ENCOUNTER — Other Ambulatory Visit: Payer: Self-pay | Admitting: Cardiovascular Disease

## 2020-11-05 NOTE — Progress Notes (Signed)
EPIC Encounter for ICM Monitoring  Patient Name: Lori Beard is a 70 y.o. female Date: 11/05/2020 Primary Care Physican: [name], L.August Saucer, MD Primary Cardiologist:Nahser Electrophysiologist:Taylor LastofficeWeight: 210lbs    Transmission reviewed.  Coruve thoracic impedancenormal.  Prescribed:   Furosemide20 mg take 1 tablet daily.  Potassium 20 mEq take 2 tablets daily.  Labs: 05/28/2020 Creatinine 1.37, BUN 17, Potassium 4.2, Sodium 143, GFR 38-46 09/14/2019 Creatinine 1.15, BUN 18, Potassium 4.3, Sodium 142, GFR 49-56  Recommendations: None  Follow-up plan: ICM clinic phone appointment on4/12/2020. 91 day device clinic remote transmission3/05/2021.   Copy of ICM check sent to Dr.Taylor.     3 month ICM trend: 10/28/2020.    1 Year ICM trend:       Karie Soda, RN 11/05/2020 12:33 PM

## 2020-11-13 ENCOUNTER — Ambulatory Visit (INDEPENDENT_AMBULATORY_CARE_PROVIDER_SITE_OTHER): Payer: Medicare Other

## 2020-11-13 DIAGNOSIS — I428 Other cardiomyopathies: Secondary | ICD-10-CM

## 2020-11-16 LAB — CUP PACEART REMOTE DEVICE CHECK
Battery Remaining Longevity: 44 mo
Battery Remaining Percentage: 43 %
Battery Voltage: 2.9 V
Brady Statistic RV Percent Paced: 1 %
Date Time Interrogation Session: 20220309025142
HighPow Impedance: 63 Ohm
HighPow Impedance: 63 Ohm
Implantable Lead Implant Date: 20141128
Implantable Lead Location: 753860
Implantable Lead Model: 7122
Implantable Pulse Generator Implant Date: 20141128
Lead Channel Impedance Value: 410 Ohm
Lead Channel Pacing Threshold Amplitude: 0.75 V
Lead Channel Pacing Threshold Pulse Width: 0.5 ms
Lead Channel Sensing Intrinsic Amplitude: 9.5 mV
Lead Channel Setting Pacing Amplitude: 2.5 V
Lead Channel Setting Pacing Pulse Width: 0.5 ms
Lead Channel Setting Sensing Sensitivity: 0.5 mV
Pulse Gen Serial Number: 7136396

## 2020-11-21 NOTE — Progress Notes (Signed)
Remote ICD transmission.   

## 2020-12-09 ENCOUNTER — Ambulatory Visit (INDEPENDENT_AMBULATORY_CARE_PROVIDER_SITE_OTHER): Payer: Medicare Other

## 2020-12-09 ENCOUNTER — Other Ambulatory Visit: Payer: Self-pay | Admitting: Cardiovascular Disease

## 2020-12-09 DIAGNOSIS — I5022 Chronic systolic (congestive) heart failure: Secondary | ICD-10-CM

## 2020-12-09 DIAGNOSIS — Z95810 Presence of automatic (implantable) cardiac defibrillator: Secondary | ICD-10-CM | POA: Diagnosis not present

## 2020-12-13 NOTE — Progress Notes (Signed)
EPIC Encounter for ICM Monitoring  Patient Name: Lori Beard is a 70 y.o. female Date: 12/13/2020 Primary Care Physican: [name], L.August Saucer, MD Primary Cardiologist:Nahser Electrophysiologist:Taylor LastofficeWeight: 210lbs    Transmission reviewed.  Coruve thoracic impedancenormal.  Prescribed:   Furosemide20 mg take 1 tablet daily.  Potassium 20 mEq take 2 tablets daily.  Labs: 05/28/2020 Creatinine 1.37, BUN 17, Potassium 4.2, Sodium 143, GFR 38-46 09/14/2019 Creatinine 1.15, BUN 18, Potassium 4.3, Sodium 142, GFR 49-56  Recommendations: None  Follow-up plan: ICM clinic phone appointment on6/05/2021.91 day device clinic remote transmission6/04/2021.   EP/Cardiology Next Visit:  Recall 12/09/2020 with Dr [name].  10/18/2021 with Tereso Newcomer, PA/Dr Nahser.  Copy of ICM check sent to Dr.Taylor.   3 month ICM trend: 12/09/2020.    1 Year ICM trend:       Karie Soda, RN 12/13/2020 8:24 AM

## 2020-12-16 ENCOUNTER — Other Ambulatory Visit: Payer: Self-pay | Admitting: Physician Assistant

## 2020-12-31 NOTE — Progress Notes (Signed)
Cardiology Office Note Date:  01/06/2021  Patient ID:  Lori, Beard 1951/08/23, MRN [id number] PCP:  Alroy Dust, L.Marlou Sa, MD  Cardiologist:  Dr. Acie Fredrickson Electrophysiologist: Dr. Lovena Le   Chief Complaint: annual visit  History of Present Illness: Lori Beard is a 70 y.o. female with history of HTN, obesity (s/p LAP-BAND surgery), GERD, NICM, chronic CHF (systolic), pt denies the diagnosis of OSA   07/2013 admitted with pneumonia. She ruled in for a NSTEMI. Her hospitalization was complicated by monomorphic VTach that progressed to VFib due to R on T PVC in the setting of hypokalemia with a K of 2.9. Echocardiogram (08/01/13): Mild LVH, global HK, EF 40-45%, Gr 1 DD, MAC, mild RVE, mildly reduced RVSF, PASP 31-35. LHC (08/02/13): Normal coronary arteries, EF 35%, LVEDP 17. She was seen by EP and underwent ICD implantation for secondary prevention of malignant ventricular arrhythmias in the setting of nonischemic cardiomyopathy.  She comes today to be seen for Dr. Lovena Le, last seen by him April 2021, doing well, no changes were made.  Most recently saw Dr. Acie Fredrickson Feb 2022 doing well, last echo noted normalization of her EF.  Continued weight loss efforts.  No changes were made.   TODAY She is doing very well. 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 No CP, palpitations or SOB.  Denies any DOE. No dizzy spells, near syncope or syncope. She has lost a couple pounds, thinks this is being up and about more Denies edema, the only time she gets swollen is if she falls asleep in her chair with her feet down.  Device information SJM single chamber ICD implanted 08/04/2013 Secondary prevention  Past Medical History:  Diagnosis Date  . Aneurysm of right iliac artery (HCC)   . Arthritis    ALL OVER  . Cardiac arrest ([place]) 08/01/2013   Polymorphic VT  . Chronic systolic CHF (congestive heart failure) ([place])   . GERD (gastroesophageal reflux disease)   .  Hypertension   . Morbid obesity ([place])    a. s/p lapband surgery 11/2012.  [name] Myocardial infarction ([place]) 08/06/13  . NICM (nonischemic cardiomyopathy) ([place])    Echocardiogram (08/01/13): Mild LVH, global HK, EF 40-45%, Gr 1 DD, MAC, mild RVE, mildly reduced RVSF, PASP 31-35.  LHC (08/02/13):  Normal coronary arteries, EF 35%, LVEDP 17.  . Pneumonia 08/01/2013  . Renal failure, acute ([place]) 08/01/2013  . S/P implantation of automatic cardioverter/defibrillator (AICD)    07/2013 Lovena Le)  . Sleep apnea 02/15/2012   SLEEP STUDY IN EPIC - MILD OSA-CPAP NOT RECOMMENDED-HOME OXYGEN SUGGESTED BECAUSE OF OXYGEN DESATS ON ROOM AIR.  [name] Ventricular tachycardia (HCC)    a. R on T PVC during admission for pneumonia/NSTEMI => monomorphic VT=>VF=>defib; s/p ICD    Past Surgical History:  Procedure Laterality Date  . ABDOMINAL HYSTERECTOMY    . BREATH TEK H PYLORI  02/15/2012   Procedure: BREATH TEK H PYLORI;  Surgeon: Pedro Earls, MD;  Location: [facility];  Service: General;  Laterality: N/A;  745  . CARDIAC CATHETERIZATION  08/02/2013   Normal coronary arteries, EF 35%   . CHOLECYSTECTOMY    . IMPLANTABLE CARDIOVERTER DEFIBRILLATOR IMPLANT N/A 08/04/2013   Procedure: IMPLANTABLE CARDIOVERTER DEFIBRILLATOR IMPLANT;  Surgeon: Evans Lance, MD;  Location: [facility] CATH LAB;  Service: Cardiovascular;  Laterality: N/A;  . JOINT REPLACEMENT     BILATERAL TOTAL HIP REPLACEMENTS  . KNEE ARTHROSCOPY    . LAPAROSCOPIC GASTRIC BANDING    .  LEFT HEART CATHETERIZATION WITH CORONARY ANGIOGRAM N/A 08/02/2013   Procedure: LEFT HEART CATHETERIZATION WITH CORONARY ANGIOGRAM;  Surgeon: Wellington Hampshire, MD;  Location: [facility];  Service: Cardiovascular;  Laterality: N/A;  . MESH APPLIED TO LAP PORT N/A 11/22/2012   Procedure: MESH APPLIED TO LAP PORT;  Surgeon: Pedro Earls, MD;  Location: [facility];  Service: General;  Laterality: N/A;  . PACEMAKER INSERTION   08/04/2013   St[employer] [name] single chamber ICD     Current Outpatient Medications  Medication Sig Dispense Refill  . amitriptyline (ELAVIL) 25 MG tablet Take 75 mg by mouth at bedtime.    [name] aspirin 81 MG tablet Take 1 tablet (81 mg total) by mouth every evening.    [name] atorvastatin (LIPITOR) 10 MG tablet Take 1 tablet (10 mg total) by mouth daily. FOLLOW UP DUE IN April 2022. PLEASE CALL AND SCHEDULE 30 tablet 3  . carvedilol (COREG) 25 MG tablet TAKE 1 TABLET BY MOUTH TWICE DAILY WITH A MEAL 180 tablet 3  . cetirizine (ZYRTEC) 5 MG tablet Take 1 tablet (5 mg total) by mouth daily. 14 tablet 1  . furosemide (LASIX) 20 MG tablet TAKE 1 TABLET BY MOUTH ONCE DAILY IN THE MORNING *FOLLOW  UP  DUE  IN  MARCH  2022.  PLEASE  SCHEDULE* 90 tablet 1  . hydrALAZINE (APRESOLINE) 25 MG tablet TAKE 1 TABLET BY MOUTH THREE TIMES DAILY 270 tablet 3  . hydrocerin (EUCERIN) CREA Apply 1 application topically 2 (two) times daily. 454 g 0  . HYDROcodone-acetaminophen (NORCO) 7.5-325 MG tablet Take 1 tablet by mouth every 8 (eight) hours as needed for moderate pain or severe pain.   0  . isosorbide dinitrate (ISORDIL) 20 MG tablet Take 1 tablet (20 mg total) by mouth 2 (two) times daily. 180 tablet 3  . losartan (COZAAR) 50 MG tablet Take 1 tablet (50 mg total) by mouth daily. 30 tablet 2  . pantoprazole (PROTONIX) 40 MG tablet Take 40 mg by mouth daily.    . potassium chloride SA (KLOR-CON) 20 MEQ tablet Take 2 tablets (40 mEq total) by mouth daily. Please make overdue appt with Dr. Acie Fredrickson before anymore refills. Thank you 2nd attempt 30 tablet 0  . triamcinolone ointment (KENALOG) 0.5 % Apply 1 application topically 2 (two) times daily. 30 g 0   No current facility-administered medications for this visit.    Allergies:   Iron, Ace inhibitors, and Morphine and related   Social History:  The patient  reports that she has never smoked. She has never used smokeless tobacco. She reports that she does not drink alcohol and does not use drugs.   Family History:   The patient's family history includes Breast cancer in her sister; Colon cancer in her father; Heart disease in her father; Hypertension in her father and paternal grandmother; Kidney disease in her mother; Stroke in her paternal grandmother.  ROS:  Please see the history of present illness.    All other systems are reviewed and otherwise negative.   PHYSICAL EXAM:  VS:  BP 112/80   Pulse 71   Ht _0  (1.6 m)   Wt 199 lb 9.6 oz (90.5 kg)   SpO2 93%   BMI 35.36 kg/m  BMI: Body mass index is 35.36 kg/m. Well nourished, well developed, in no acute distress HEENT: normocephalic, atraumatic Neck: no JVD, carotid bruits or masses Cardiac:  RRR; no significant murmurs, no rubs, or gallops Lungs:  CTA b/l, no wheezing,  rhonchi or rales Abd: soft, nontender MS: no deformity or atrophy Ext: trace if any edema Skin: warm and dry, no rash Neuro:  No gross deficits appreciated Psych: euthymic mood, full affect  ICD site is stable, no tethering or discomfort   EKG:  Not done today   Device interrogation done today and reviewed by myself:  Battery and lead measurements are good. No arrhythmias VP <1%   01/08/2018: TTE Study Conclusions  - Left ventricle: LVEF is approximately 50% with apical  hypokinesis. The cavity size was normal. Wall thickness was  increased in a pattern of mild LVH. Systolic function was normal.  The estimated ejection fraction was in the range of 50% to 55%.  - Pulmonary arteries: PA peak pressure: 38 mm Hg (S).    Recent Labs: No results found for requested labs within last 8760 hours.  No results found for requested labs within last 8760 hours.   CrCl cannot be calculated (Patient's most recent lab result is older than the maximum 21 days allowed.).   Wt Readings from Last 3 Encounters:  01/06/21 199 lb 9.6 oz (90.5 kg)  10/23/20 201 lb 6.4 oz (91.4 kg)  12/13/19 210 lb (95.3 kg)     Other studies reviewed: Additional studies/records reviewed  today include: summarized above  ASSESSMENT AND PLAN:  1. ICD     Intact function, no programming changes made  2. NICM     Recovered LVEF by her last echo     No symptoms or exam findings of volume OL     CorVue is below threshold with a downwards trend though exam does no support this     Weight is down a couple pounds from her last time in     On BB, ARB, nitrate, diuretic/K+     C/w Dr. Acie Fredrickson  Discussed monitoring for edema, symptoms C/w L. Short, RN  3. Hx of VT/ VF arrest     Not again     No arrhythmias noted  4. HTN     Looks great    Disposition: F/u with remotes as usual and in clinic with EP in a year, sooner if needed  Current medicines are reviewed at length with the patient today.  The patient did not have any concerns regarding medicines.  Venetia Night, PA-C 01/06/2021 9:17 AM     [facility] [address] [place] [address] [phone] (office)  [phone] (fax)

## 2021-01-03 DIAGNOSIS — Z23 Encounter for immunization: Secondary | ICD-10-CM | POA: Diagnosis not present

## 2021-01-03 DIAGNOSIS — E7800 Pure hypercholesterolemia, unspecified: Secondary | ICD-10-CM | POA: Diagnosis not present

## 2021-01-03 DIAGNOSIS — I10 Essential (primary) hypertension: Secondary | ICD-10-CM | POA: Diagnosis not present

## 2021-01-03 DIAGNOSIS — G4700 Insomnia, unspecified: Secondary | ICD-10-CM | POA: Diagnosis not present

## 2021-01-03 DIAGNOSIS — M150 Primary generalized (osteo)arthritis: Secondary | ICD-10-CM | POA: Diagnosis not present

## 2021-01-03 DIAGNOSIS — N183 Chronic kidney disease, stage 3 unspecified: Secondary | ICD-10-CM | POA: Diagnosis not present

## 2021-01-06 ENCOUNTER — Other Ambulatory Visit: Payer: Self-pay

## 2021-01-06 ENCOUNTER — Ambulatory Visit: Payer: Medicare Other | Admitting: Physician Assistant

## 2021-01-06 ENCOUNTER — Encounter: Payer: Self-pay | Admitting: Physician Assistant

## 2021-01-06 VITALS — BP 112/80 | HR 71 | Ht 63.0 in | Wt 199.6 lb

## 2021-01-06 DIAGNOSIS — Z95810 Presence of automatic (implantable) cardiac defibrillator: Secondary | ICD-10-CM

## 2021-01-06 DIAGNOSIS — I472 Ventricular tachycardia, unspecified: Secondary | ICD-10-CM

## 2021-01-06 DIAGNOSIS — I10 Essential (primary) hypertension: Secondary | ICD-10-CM | POA: Diagnosis not present

## 2021-01-06 DIAGNOSIS — I428 Other cardiomyopathies: Secondary | ICD-10-CM

## 2021-01-06 DIAGNOSIS — I5022 Chronic systolic (congestive) heart failure: Secondary | ICD-10-CM

## 2021-01-06 LAB — CUP PACEART INCLINIC DEVICE CHECK
Battery Remaining Longevity: 44 mo
Brady Statistic RV Percent Paced: 0 %
Date Time Interrogation Session: 20220502093956
HighPow Impedance: 54 Ohm
Implantable Lead Implant Date: 20141128
Implantable Lead Location: 753860
Implantable Lead Model: 7122
Implantable Pulse Generator Implant Date: 20141128
Lead Channel Impedance Value: 387.5 Ohm
Lead Channel Pacing Threshold Amplitude: 1 V
Lead Channel Pacing Threshold Amplitude: 1 V
Lead Channel Pacing Threshold Pulse Width: 0.5 ms
Lead Channel Pacing Threshold Pulse Width: 0.5 ms
Lead Channel Sensing Intrinsic Amplitude: 9.3 mV
Lead Channel Setting Pacing Amplitude: 2.5 V
Lead Channel Setting Pacing Pulse Width: 0.5 ms
Lead Channel Setting Sensing Sensitivity: 0.5 mV
Pulse Gen Serial Number: 7136396

## 2021-01-06 NOTE — Patient Instructions (Signed)
Medication Instructions:   Your physician recommends that you continue on your current medications as directed. Please refer to the Current Medication list given to you today.  *If you need a refill on your cardiac medications before your next appointment, please call your pharmacy*   Lab Work: NONE ORDERED  TODAY   If you have labs (blood work) drawn today and your tests are completely normal, you will receive your results only by: [name] MyChart Message (if you have MyChart) OR . A paper copy in the mail If you have any lab test that is abnormal or we need to change your treatment, we will call you to review the results.   Testing/Procedures: NONE ORDERED  TODAY    Follow-Up: At [facility], you and your health needs are our priority.  As part of our continuing mission to provide you with exceptional heart care, we have created designated Provider Care Teams.  These Care Teams include your primary Cardiologist (physician) and Advanced Practice Providers (APPs -  Physician Assistants and Nurse Practitioners) who all work together to provide you with the care you need, when you need it.  We recommend signing up for the patient portal called "MyChart".  Sign up information is provided on this After Visit Summary.  MyChart is used to connect with patients for Virtual Visits (Telemedicine).  Patients are able to view lab/test results, encounter notes, upcoming appointments, etc.  Non-urgent messages can be sent to your provider as well.   To learn more about what you can do with MyChart, go to [email or web address].    Your next appointment:   1 year(s)  The format for your next appointment:   In Person  Provider:   Francis Dowse, PA-C / [name]   Other Instructions

## 2021-01-08 ENCOUNTER — Other Ambulatory Visit: Payer: Self-pay | Admitting: Cardiovascular Disease

## 2021-01-08 DIAGNOSIS — I2510 Atherosclerotic heart disease of native coronary artery without angina pectoris: Secondary | ICD-10-CM

## 2021-01-17 ENCOUNTER — Other Ambulatory Visit: Payer: Self-pay | Admitting: Internal Medicine

## 2021-01-20 DIAGNOSIS — Z1231 Encounter for screening mammogram for malignant neoplasm of breast: Secondary | ICD-10-CM | POA: Diagnosis not present

## 2021-02-04 DIAGNOSIS — L723 Sebaceous cyst: Secondary | ICD-10-CM | POA: Diagnosis not present

## 2021-02-12 ENCOUNTER — Ambulatory Visit (INDEPENDENT_AMBULATORY_CARE_PROVIDER_SITE_OTHER): Payer: Medicare Other

## 2021-02-12 DIAGNOSIS — I428 Other cardiomyopathies: Secondary | ICD-10-CM | POA: Diagnosis not present

## 2021-02-14 NOTE — Progress Notes (Signed)
No ICM remote transmission received for 02/10/2021 and next ICM transmission scheduled for 02/24/2021.    

## 2021-02-17 LAB — CUP PACEART REMOTE DEVICE CHECK
Battery Remaining Longevity: 42 mo
Battery Remaining Percentage: 41 %
Battery Voltage: 2.89 V
Brady Statistic RV Percent Paced: 1 %
Date Time Interrogation Session: 20220608034352
HighPow Impedance: 56 Ohm
HighPow Impedance: 56 Ohm
Implantable Lead Implant Date: 20141128
Implantable Lead Location: 753860
Implantable Lead Model: 7122
Implantable Pulse Generator Implant Date: 20141128
Lead Channel Impedance Value: 410 Ohm
Lead Channel Pacing Threshold Amplitude: 1 V
Lead Channel Pacing Threshold Pulse Width: 0.5 ms
Lead Channel Sensing Intrinsic Amplitude: 7.9 mV
Lead Channel Setting Pacing Amplitude: 2.5 V
Lead Channel Setting Pacing Pulse Width: 0.5 ms
Lead Channel Setting Sensing Sensitivity: 0.5 mV
Pulse Gen Serial Number: 7136396

## 2021-02-24 ENCOUNTER — Ambulatory Visit (INDEPENDENT_AMBULATORY_CARE_PROVIDER_SITE_OTHER): Payer: Medicare Other

## 2021-02-24 DIAGNOSIS — Z95810 Presence of automatic (implantable) cardiac defibrillator: Secondary | ICD-10-CM | POA: Diagnosis not present

## 2021-02-24 DIAGNOSIS — I5022 Chronic systolic (congestive) heart failure: Secondary | ICD-10-CM

## 2021-02-28 ENCOUNTER — Telehealth: Payer: Self-pay

## 2021-02-28 NOTE — Telephone Encounter (Signed)
Remote ICM transmission received.  Attempted call to patient regarding ICM remote transmission and no answer.  

## 2021-02-28 NOTE — Progress Notes (Signed)
EPIC Encounter for ICM Monitoring  Patient Name: Lori Beard is a 70 y.o. female Date: 02/28/2021 Primary Care Physican: [name], L.August Saucer, MD Primary Cardiologist: Nahser Electrophysiologist: [name] Last office Weight: 210 lbs                                                            Attempted call to patient and unable to reach.   Transmission reviewed.    Coruve thoracic impedance normal but was suggesting possible fluid accumulation from 6/4-6/14.    Prescribed: Furosemide 20 mg take 1 tablet daily. Potassium 20 mEq take 2 tablets daily.   Labs: 01/03/2021 Creatinine 1.40, BUN 20, Potassium 4.5, Sodium 142, GFR 40 A complete set of results can be found in Results Review.   Recommendations:  Unable to reach.     Follow-up plan: ICM clinic phone appointment on 04/07/2021.   91 day device clinic remote transmission 05/14/2021.     EP/Cardiology Next Visit:  Recall 01/01/2022 with Dr [name].  10/18/2021 with Tereso Newcomer, PA/Dr Nahser.   Copy of ICM check sent to Dr. [name].  .   3 month ICM trend: 02/24/2021.    1 Year ICM trend:       Karie Soda, RN 02/28/2021 8:49 AM

## 2021-03-07 NOTE — Progress Notes (Signed)
Remote ICD transmission.   

## 2021-04-09 ENCOUNTER — Telehealth: Payer: Self-pay

## 2021-04-09 NOTE — Telephone Encounter (Signed)
LMOVM reminding pt to send remote transmission.  Advised monitor is currently showing as disconnected.  Left message with phone number is assistance needed to send remote transmission.

## 2021-04-14 NOTE — Progress Notes (Signed)
No ICM remote transmission received for 04/07/2021 and next ICM transmission scheduled for 05/05/2021.    

## 2021-04-28 ENCOUNTER — Ambulatory Visit (INDEPENDENT_AMBULATORY_CARE_PROVIDER_SITE_OTHER): Payer: Medicare Other

## 2021-04-28 DIAGNOSIS — Z95810 Presence of automatic (implantable) cardiac defibrillator: Secondary | ICD-10-CM | POA: Diagnosis not present

## 2021-04-28 DIAGNOSIS — I5022 Chronic systolic (congestive) heart failure: Secondary | ICD-10-CM | POA: Diagnosis not present

## 2021-05-02 NOTE — Progress Notes (Signed)
EPIC Encounter for ICM Monitoring  Patient Name: Lori Beard is a 70 y.o. female Date: 05/02/2021 Primary Care Physican: [name], L.August Saucer, MD Primary Cardiologist: Nahser Electrophysiologist: [name] Last office Weight: 210 lbs                                                            Transmission reviewed.    Coruve thoracic impedance suggesting normal fluid levels.    Prescribed: Furosemide 20 mg take 1 tablet daily. Potassium 20 mEq take 2 tablets daily.   Labs: 01/03/2021 Creatinine 1.40, BUN 20, Potassium 4.5, Sodium 142, GFR 40 A complete set of results can be found in Results Review.   Recommendations:  No changes    Follow-up plan: ICM clinic phone appointment on 06/09/2021.   91 day device clinic remote transmission 05/14/2021.     EP/Cardiology Next Visit:  Recall 01/01/2022 with Dr [name].  10/18/2021 with Tereso Newcomer, PA/Dr Nahser.   Copy of ICM check sent to Dr. [name].   3 month ICM trend: 04/25/2021.    1 Year ICM trend:       Karie Soda, RN 05/02/2021 1:43 PM

## 2021-05-14 ENCOUNTER — Ambulatory Visit (INDEPENDENT_AMBULATORY_CARE_PROVIDER_SITE_OTHER): Payer: Medicare Other

## 2021-05-14 DIAGNOSIS — I428 Other cardiomyopathies: Secondary | ICD-10-CM | POA: Diagnosis not present

## 2021-05-14 LAB — CUP PACEART REMOTE DEVICE CHECK
Battery Remaining Longevity: 40 mo
Battery Remaining Percentage: 39 %
Battery Voltage: 2.87 V
Brady Statistic RV Percent Paced: 1 %
Date Time Interrogation Session: 20220907020015
HighPow Impedance: 52 Ohm
HighPow Impedance: 52 Ohm
Implantable Lead Implant Date: 20141128
Implantable Lead Location: 753860
Implantable Lead Model: 7122
Implantable Pulse Generator Implant Date: 20141128
Lead Channel Impedance Value: 400 Ohm
Lead Channel Pacing Threshold Amplitude: 1 V
Lead Channel Pacing Threshold Pulse Width: 0.5 ms
Lead Channel Sensing Intrinsic Amplitude: 8.2 mV
Lead Channel Setting Pacing Amplitude: 2.5 V
Lead Channel Setting Pacing Pulse Width: 0.5 ms
Lead Channel Setting Sensing Sensitivity: 0.5 mV
Pulse Gen Serial Number: 7136396

## 2021-05-22 NOTE — Progress Notes (Signed)
Remote ICD transmission.   

## 2021-05-26 ENCOUNTER — Other Ambulatory Visit: Payer: Self-pay | Admitting: Cardiovascular Disease

## 2021-06-09 ENCOUNTER — Ambulatory Visit (INDEPENDENT_AMBULATORY_CARE_PROVIDER_SITE_OTHER): Payer: Medicare Other

## 2021-06-09 DIAGNOSIS — I5022 Chronic systolic (congestive) heart failure: Secondary | ICD-10-CM | POA: Diagnosis not present

## 2021-06-09 DIAGNOSIS — Z95810 Presence of automatic (implantable) cardiac defibrillator: Secondary | ICD-10-CM

## 2021-06-11 NOTE — Progress Notes (Signed)
EPIC Encounter for ICM Monitoring  Patient Name: Min Tunnell is a 70 y.o. female Date: 06/11/2021 Primary Care Physican: [name], L.August Saucer, MD Primary Cardiologist: Nahser Electrophysiologist: [name] 06/11/2021 Weight: 205 lbs                                                            Spoke with patient and heart failure questions reviewed.  Pt asymptomatic for fluid accumulation and feeling well.   Coruve thoracic impedance suggesting normal fluid levels.    Prescribed: Furosemide 20 mg take 1 tablet daily. Potassium 20 mEq take 2 tablets daily.   Labs: 01/03/2021 Creatinine 1.40, BUN 20, Potassium 4.5, Sodium 142, GFR 40 A complete set of results can be found in Results Review.   Recommendations:  No changes and encouraged to call if experiencing any fluid symptoms.   Follow-up plan: ICM clinic phone appointment on 07/14/2021.   91 day device clinic remote transmission 08/13/2021.     EP/Cardiology Next Visit:  Recall 01/01/2022 with Dr [name].  10/18/2021 with Tereso Newcomer, PA/Dr Nahser.   Copy of ICM check sent to Dr. [name].    3 month ICM trend: 06/09/2021.    1 Year ICM trend:       Karie Soda, RN 06/11/2021 2:37 PM

## 2021-07-04 ENCOUNTER — Other Ambulatory Visit: Payer: Self-pay | Admitting: Cardiovascular Disease

## 2021-07-04 DIAGNOSIS — I2510 Atherosclerotic heart disease of native coronary artery without angina pectoris: Secondary | ICD-10-CM

## 2021-07-07 DIAGNOSIS — G4700 Insomnia, unspecified: Secondary | ICD-10-CM | POA: Diagnosis not present

## 2021-07-07 DIAGNOSIS — E7800 Pure hypercholesterolemia, unspecified: Secondary | ICD-10-CM | POA: Diagnosis not present

## 2021-07-07 DIAGNOSIS — E669 Obesity, unspecified: Secondary | ICD-10-CM | POA: Diagnosis not present

## 2021-07-07 DIAGNOSIS — I10 Essential (primary) hypertension: Secondary | ICD-10-CM | POA: Diagnosis not present

## 2021-07-07 DIAGNOSIS — S0083XA Contusion of other part of head, initial encounter: Secondary | ICD-10-CM | POA: Diagnosis not present

## 2021-07-07 DIAGNOSIS — N183 Chronic kidney disease, stage 3 unspecified: Secondary | ICD-10-CM | POA: Diagnosis not present

## 2021-07-07 DIAGNOSIS — M150 Primary generalized (osteo)arthritis: Secondary | ICD-10-CM | POA: Diagnosis not present

## 2021-07-14 ENCOUNTER — Ambulatory Visit (INDEPENDENT_AMBULATORY_CARE_PROVIDER_SITE_OTHER): Payer: Medicare Other

## 2021-07-14 DIAGNOSIS — Z95810 Presence of automatic (implantable) cardiac defibrillator: Secondary | ICD-10-CM

## 2021-07-14 DIAGNOSIS — I5022 Chronic systolic (congestive) heart failure: Secondary | ICD-10-CM

## 2021-07-15 NOTE — Progress Notes (Signed)
EPIC Encounter for ICM Monitoring  Patient Name: Lori Beard is a 70 y.o. female Date: 07/15/2021 Primary Care Physican: [name], L.August Saucer, MD Primary Cardiologist: Nahser Electrophysiologist: [name] 06/11/2021 Weight: 205 lbs                                                            Spoke with patient and heart failure questions reviewed.  Pt asymptomatic for fluid accumulation and feeling well.   Coruve thoracic impedance suggesting normal fluid levels.    Prescribed: Furosemide 20 mg take 1 tablet daily. Potassium 20 mEq take 2 tablets daily.   Labs: 01/03/2021 Creatinine 1.40, BUN 20, Potassium 4.5, Sodium 142, GFR 40 A complete set of results can be found in Results Review.   Recommendations:  No changes and encouraged to call if experiencing any fluid symptoms.   Follow-up plan: ICM clinic phone appointment on 08/18/2021.   91 day device clinic remote transmission 08/13/2021.     EP/Cardiology Next Visit:  Recall 01/01/2022 with Dr [name].  10/18/2021 with Tereso Newcomer, PA/Dr Nahser.   Copy of ICM check sent to Dr. [name].     3 month ICM trend: 07/14/2021.    1 Year ICM trend:       Karie Soda, RN 07/15/2021 4:43 PM

## 2021-08-13 ENCOUNTER — Ambulatory Visit (INDEPENDENT_AMBULATORY_CARE_PROVIDER_SITE_OTHER): Payer: Medicare Other

## 2021-08-13 DIAGNOSIS — I428 Other cardiomyopathies: Secondary | ICD-10-CM

## 2021-08-13 LAB — CUP PACEART REMOTE DEVICE CHECK
Battery Remaining Longevity: 37 mo
Battery Remaining Percentage: 37 %
Battery Voltage: 2.87 V
Brady Statistic RV Percent Paced: 1 %
Date Time Interrogation Session: 20221207020015
HighPow Impedance: 56 Ohm
HighPow Impedance: 56 Ohm
Implantable Lead Implant Date: 20141128
Implantable Lead Location: 753860
Implantable Lead Model: 7122
Implantable Pulse Generator Implant Date: 20141128
Lead Channel Impedance Value: 350 Ohm
Lead Channel Pacing Threshold Amplitude: 1 V
Lead Channel Pacing Threshold Pulse Width: 0.5 ms
Lead Channel Sensing Intrinsic Amplitude: 8.4 mV
Lead Channel Setting Pacing Amplitude: 2.5 V
Lead Channel Setting Pacing Pulse Width: 0.5 ms
Lead Channel Setting Sensing Sensitivity: 0.5 mV
Pulse Gen Serial Number: 7136396

## 2021-08-18 ENCOUNTER — Ambulatory Visit (INDEPENDENT_AMBULATORY_CARE_PROVIDER_SITE_OTHER): Payer: Self-pay

## 2021-08-18 DIAGNOSIS — Z95810 Presence of automatic (implantable) cardiac defibrillator: Secondary | ICD-10-CM

## 2021-08-18 DIAGNOSIS — I5022 Chronic systolic (congestive) heart failure: Secondary | ICD-10-CM

## 2021-08-20 ENCOUNTER — Telehealth: Payer: Self-pay

## 2021-08-20 NOTE — Progress Notes (Signed)
EPIC Encounter for ICM Monitoring  Patient Name: Lori Beard is a 70 y.o. female Date: 08/20/2021 Primary Care Physican: [name], L.August Saucer, MD Primary Cardiologist: Nahser Electrophysiologist: [name] 06/11/2021 Weight: 205 lbs                                                            Attempted call to patient and unable to reach.  Left message to return call Transmission reviewed.    Coruve thoracic impedance suggesting normal fluid levels but was suggesting possible fluid accumulation from 11/27-12/2.   Prescribed: Furosemide 20 mg take 1 tablet daily. Potassium 20 mEq take 2 tablets daily.   Labs: 01/03/2021 Creatinine 1.40, BUN 20, Potassium 4.5, Sodium 142, GFR 40 A complete set of results can be found in Results Review.   Recommendations: Unable to reach.     Follow-up plan: ICM clinic phone appointment on 09/22/2021.   91 day device clinic remote transmission 08/13/2021.     EP/Cardiology Next Visit:  Recall 01/01/2022 with Dr [name].  10/18/2021 with Tereso Newcomer, PA/Dr Nahser.   Copy of ICM check sent to Dr. [name].   3 month ICM trend: 08/20/2021.    12-14 Month ICM trend:       Karie Soda, RN 08/20/2021 12:27 PM

## 2021-08-20 NOTE — Telephone Encounter (Signed)
Remote ICM transmission received.  Attempted call to patient regarding ICM remote transmission and left message per DPR to return call.   

## 2021-08-22 NOTE — Progress Notes (Signed)
Remote ICD transmission.   

## 2021-09-22 ENCOUNTER — Ambulatory Visit (INDEPENDENT_AMBULATORY_CARE_PROVIDER_SITE_OTHER): Payer: Self-pay

## 2021-09-22 DIAGNOSIS — I5022 Chronic systolic (congestive) heart failure: Secondary | ICD-10-CM

## 2021-09-22 DIAGNOSIS — Z95810 Presence of automatic (implantable) cardiac defibrillator: Secondary | ICD-10-CM

## 2021-09-24 NOTE — Progress Notes (Signed)
EPIC Encounter for ICM Monitoring  Patient Name: Lori Beard is a 71 y.o. female Date: 09/24/2021 Primary Care Physican: [name], L.August Saucer, MD Primary Cardiologist: Nahser Electrophysiologist: [name] 09/24/2021 Weight: 205 lbs                                                            Spoke with patient and heart failure questions reviewed.  Pt asymptomatic for fluid accumulation.  Reports feeling well at this time and voices no complaints.    Coruve thoracic impedance suggesting normal fluid levels.   Prescribed: Furosemide 20 mg take 1 tablet daily. Potassium 20 mEq take 2 tablets daily.   Labs: 01/03/2021 Creatinine 1.40, BUN 20, Potassium 4.5, Sodium 142, GFR 40 A complete set of results can be found in Results Review.   Recommendations: No changes and encouraged to call if experiencing any fluid symptoms.  Advised to cal office to schedule physician appointments due in Feb and April.    Follow-up plan: ICM clinic phone appointment on 10/27/2021.   91 day device clinic remote transmission 11/12/2021.     EP/Cardiology Next Visit:  Recall 01/01/2022 with Dr [name].   Recall 10/18/2021 with Tereso Newcomer, PA/Dr Nahser.   Copy of ICM check sent to Dr. [name].    3 month ICM trend: 09/22/2021.    12-14 Month ICM trend:     Karie Soda, RN 09/24/2021 1:16 PM

## 2021-10-05 ENCOUNTER — Other Ambulatory Visit: Payer: Self-pay | Admitting: Cardiovascular Disease

## 2021-10-05 DIAGNOSIS — I2510 Atherosclerotic heart disease of native coronary artery without angina pectoris: Secondary | ICD-10-CM

## 2021-10-09 DIAGNOSIS — E669 Obesity, unspecified: Secondary | ICD-10-CM | POA: Diagnosis not present

## 2021-10-09 DIAGNOSIS — Z95810 Presence of automatic (implantable) cardiac defibrillator: Secondary | ICD-10-CM | POA: Diagnosis not present

## 2021-10-09 DIAGNOSIS — Z6834 Body mass index (BMI) 34.0-34.9, adult: Secondary | ICD-10-CM | POA: Diagnosis not present

## 2021-10-09 DIAGNOSIS — Z79899 Other long term (current) drug therapy: Secondary | ICD-10-CM | POA: Diagnosis not present

## 2021-10-09 DIAGNOSIS — Z1159 Encounter for screening for other viral diseases: Secondary | ICD-10-CM | POA: Diagnosis not present

## 2021-10-09 DIAGNOSIS — Z0000 Encounter for general adult medical examination without abnormal findings: Secondary | ICD-10-CM | POA: Diagnosis not present

## 2021-10-09 DIAGNOSIS — Z950 Presence of cardiac pacemaker: Secondary | ICD-10-CM | POA: Diagnosis not present

## 2021-10-13 DIAGNOSIS — Z79899 Other long term (current) drug therapy: Secondary | ICD-10-CM | POA: Diagnosis not present

## 2021-10-13 DIAGNOSIS — Z95810 Presence of automatic (implantable) cardiac defibrillator: Secondary | ICD-10-CM | POA: Diagnosis not present

## 2021-10-13 DIAGNOSIS — E669 Obesity, unspecified: Secondary | ICD-10-CM | POA: Diagnosis not present

## 2021-10-13 DIAGNOSIS — Z1159 Encounter for screening for other viral diseases: Secondary | ICD-10-CM | POA: Diagnosis not present

## 2021-10-24 DIAGNOSIS — F325 Major depressive disorder, single episode, in full remission: Secondary | ICD-10-CM | POA: Diagnosis not present

## 2021-10-24 DIAGNOSIS — I723 Aneurysm of iliac artery: Secondary | ICD-10-CM | POA: Diagnosis not present

## 2021-10-24 DIAGNOSIS — N1832 Chronic kidney disease, stage 3b: Secondary | ICD-10-CM | POA: Diagnosis not present

## 2021-10-24 DIAGNOSIS — E778 Other disorders of glycoprotein metabolism: Secondary | ICD-10-CM | POA: Diagnosis not present

## 2021-10-24 DIAGNOSIS — Z79899 Other long term (current) drug therapy: Secondary | ICD-10-CM | POA: Diagnosis not present

## 2021-10-24 DIAGNOSIS — N2581 Secondary hyperparathyroidism of renal origin: Secondary | ICD-10-CM | POA: Diagnosis not present

## 2021-10-24 DIAGNOSIS — Z0001 Encounter for general adult medical examination with abnormal findings: Secondary | ICD-10-CM | POA: Diagnosis not present

## 2021-10-24 DIAGNOSIS — E559 Vitamin D deficiency, unspecified: Secondary | ICD-10-CM | POA: Diagnosis not present

## 2021-10-24 DIAGNOSIS — E669 Obesity, unspecified: Secondary | ICD-10-CM | POA: Diagnosis not present

## 2021-10-27 ENCOUNTER — Telehealth: Payer: Self-pay

## 2021-10-27 ENCOUNTER — Ambulatory Visit (INDEPENDENT_AMBULATORY_CARE_PROVIDER_SITE_OTHER): Payer: Self-pay

## 2021-10-27 DIAGNOSIS — Z95810 Presence of automatic (implantable) cardiac defibrillator: Secondary | ICD-10-CM

## 2021-10-27 DIAGNOSIS — I5022 Chronic systolic (congestive) heart failure: Secondary | ICD-10-CM

## 2021-10-27 NOTE — Telephone Encounter (Signed)
Remote ICM transmission received.  Attempted call to patient regarding ICM remote transmission and no answer, mail box full.  

## 2021-10-27 NOTE — Progress Notes (Signed)
EPIC Encounter for ICM Monitoring  Patient Name: Lori Beard is a 71 y.o. female Date: 10/27/2021 Primary Care Physican: [name], L.August Saucer, MD Primary Cardiologist: Nahser Electrophysiologist: [name] 09/24/2021 Weight: 205 lbs                                                            Attempted call to patient and unable to reach.  Left message to return call. Transmission reviewed.    Coruve thoracic impedance suggesting possible fluid accumulation starting 2/18.   Prescribed: Furosemide 20 mg take 1 tablet daily. Potassium 20 mEq take 2 tablets daily.   Labs: 01/03/2021 Creatinine 1.40, BUN 20, Potassium 4.5, Sodium 142, GFR 40 A complete set of results can be found in Results Review.   Recommendations:  Unable to reach.     Follow-up plan: ICM clinic phone appointment on 11/04/2021 to recheck fluid levels.   91 day device clinic remote transmission 11/12/2021.     EP/Cardiology Next Visit:  Recall 01/01/2022 with Dr [name].   Recall 10/18/2021 with Tereso Newcomer, PA/Dr Nahser.   Copy of ICM check sent to Dr. [name].  Will send copy to Dr Elease Hashimoto for review if patient is reached.  3 month ICM trend: 10/27/2021.    12-14 Month ICM trend:     Karie Soda, RN 10/27/2021 2:40 PM

## 2021-11-04 ENCOUNTER — Telehealth: Payer: Self-pay

## 2021-11-04 ENCOUNTER — Ambulatory Visit (INDEPENDENT_AMBULATORY_CARE_PROVIDER_SITE_OTHER): Payer: Self-pay

## 2021-11-04 DIAGNOSIS — I5022 Chronic systolic (congestive) heart failure: Secondary | ICD-10-CM

## 2021-11-04 DIAGNOSIS — Z95810 Presence of automatic (implantable) cardiac defibrillator: Secondary | ICD-10-CM

## 2021-11-04 NOTE — Progress Notes (Signed)
EPIC Encounter for ICM Monitoring  Patient Name: Lori Beard is a 71 y.o. female Date: 11/04/2021 Primary Care Physican: [name], L.August Saucer, MD Primary Cardiologist: Nahser Electrophysiologist: [name] 09/24/2021 Weight: 205 lbs                                                            Attempted call to patient and unable to reach.  Left message to return call. Transmission reviewed.    Coruve thoracic impedance suggesting fluid levels corrected itself back to baseline on 2/23 but restarted downward trend 2/27 suggesting possible return of fluid accumulation.   Prescribed: Furosemide 20 mg take 1 tablet daily. Potassium 20 mEq take 2 tablets daily.   Labs: 01/03/2021 Creatinine 1.40, BUN 20, Potassium 4.5, Sodium 142, GFR 40 A complete set of results can be found in Results Review.   Recommendations:  Unable to reach.     Follow-up plan: ICM clinic phone appointment on 11/11/2021 to recheck fluid levels.   91 day device clinic remote transmission 11/12/2021.     EP/Cardiology Next Visit:  Recall 01/01/2022 with Dr [name].   Recall 10/18/2021 with Tereso Newcomer, PA/Dr Nahser.   Copy of ICM check sent to Dr. [name].  Will send copy to Dr Elease Hashimoto for review if patient is reached.  3 month ICM trend: 11/04/2021.    12-14 Month ICM trend:     Karie Soda, RN 11/04/2021 10:26 AM

## 2021-11-04 NOTE — Telephone Encounter (Signed)
Remote ICM transmission received.  Attempted call to patient regarding ICM remote transmission and mail box is full. 

## 2021-11-10 ENCOUNTER — Other Ambulatory Visit: Payer: Self-pay | Admitting: Cardiovascular Disease

## 2021-11-11 ENCOUNTER — Ambulatory Visit (INDEPENDENT_AMBULATORY_CARE_PROVIDER_SITE_OTHER): Payer: Self-pay

## 2021-11-11 DIAGNOSIS — I5022 Chronic systolic (congestive) heart failure: Secondary | ICD-10-CM

## 2021-11-11 DIAGNOSIS — Z95810 Presence of automatic (implantable) cardiac defibrillator: Secondary | ICD-10-CM

## 2021-11-11 LAB — CUP PACEART REMOTE DEVICE CHECK
Battery Remaining Longevity: 36 mo
Battery Remaining Percentage: 35 %
Battery Voltage: 2.86 V
Brady Statistic RV Percent Paced: 1 %
Date Time Interrogation Session: 20230307040018
HighPow Impedance: 66 Ohm
HighPow Impedance: 66 Ohm
Implantable Lead Implant Date: 20141128
Implantable Lead Location: 753860
Implantable Lead Model: 7122
Implantable Pulse Generator Implant Date: 20141128
Lead Channel Impedance Value: 460 Ohm
Lead Channel Pacing Threshold Amplitude: 1 V
Lead Channel Pacing Threshold Pulse Width: 0.5 ms
Lead Channel Sensing Intrinsic Amplitude: 10.2 mV
Lead Channel Setting Pacing Amplitude: 2.5 V
Lead Channel Setting Pacing Pulse Width: 0.5 ms
Lead Channel Setting Sensing Sensitivity: 0.5 mV
Pulse Gen Serial Number: 7136396

## 2021-11-11 NOTE — Progress Notes (Signed)
EPIC Encounter for ICM Monitoring ? ?Patient Name: Lori Beard is a 71 y.o. female ?Date: 11/11/2021 ?Primary Care Physican: [name], L.August Saucer, MD ?Primary Cardiologist: Nahser ?Electrophysiologist: [name] ?09/24/2021 Weight: 205 lbs ?                                                         ?  ?Transmission reviewed.  ?  ?Coruve thoracic impedance suggesting fluid levels returned to normal. ?  ?Prescribed: ?Furosemide 20 mg take 1 tablet daily. ?Potassium 20 mEq take 2 tablets daily. ?  ?Labs: ?01/03/2021 Creatinine 1.40, BUN 20, Potassium 4.5, Sodium 142, GFR 40 ?A complete set of results can be found in Results Review. ?  ?Recommendations:  No changes. ?  ?Follow-up plan: ICM clinic phone appointment on 12/08/2021.   91 day device clinic remote transmission 11/12/2021.   ?  ?EP/Cardiology Next Visit:  Recall 01/01/2022 with Dr [name].   Recall 10/18/2021 with Tereso Newcomer, PA/Dr Nahser. ?  ?Copy of ICM check sent to Dr. [name]. ? ?3 month ICM trend: 11/11/2021. ? ? ? ?12-14 Month ICM trend:  ? ? ? ?Karie Soda, RN ?11/11/2021 ?4:11 PM ? ?

## 2021-11-12 ENCOUNTER — Ambulatory Visit (INDEPENDENT_AMBULATORY_CARE_PROVIDER_SITE_OTHER): Payer: Medicare Other

## 2021-11-12 ENCOUNTER — Telehealth: Payer: Self-pay | Admitting: Cardiovascular Disease

## 2021-11-12 DIAGNOSIS — I472 Ventricular tachycardia, unspecified: Secondary | ICD-10-CM

## 2021-11-12 MED ORDER — POTASSIUM CHLORIDE CRYS ER 20 MEQ PO TBCR: 40.0000 meq | EXTENDED_RELEASE_TABLET | Freq: Every day | ORAL | 1 refills | Status: DC

## 2021-11-12 NOTE — Telephone Encounter (Signed)
?*  STAT* If patient is at the pharmacy, call can be transferred to refill team. ? ? ?1. Which medications need to be refilled? (please list name of each medication and dose if known) Potassium Chloride ? ?2. Which pharmacy/location (including street and city if local pharmacy) is medication to be sent to? [facility] [place], [place] ? ?3. Do they need a 30 day or 90 day supply? Enough until her appointment on 12-29-21 ? ?

## 2021-11-12 NOTE — Telephone Encounter (Signed)
Pt's medication was sent to pt's pharmacy as requested. Confirmation received.  °

## 2021-11-22 ENCOUNTER — Other Ambulatory Visit: Payer: Self-pay | Admitting: Cardiovascular Disease

## 2021-11-25 NOTE — Progress Notes (Signed)
Remote ICD transmission.   

## 2021-12-08 ENCOUNTER — Ambulatory Visit (INDEPENDENT_AMBULATORY_CARE_PROVIDER_SITE_OTHER): Payer: Self-pay

## 2021-12-08 DIAGNOSIS — Z95810 Presence of automatic (implantable) cardiac defibrillator: Secondary | ICD-10-CM

## 2021-12-08 DIAGNOSIS — I5022 Chronic systolic (congestive) heart failure: Secondary | ICD-10-CM

## 2021-12-12 NOTE — Progress Notes (Signed)
EPIC Encounter for ICM Monitoring ? ?Patient Name: Lori Beard is a 71 y.o. female ?Date: 12/12/2021 ?Primary Care Physican: [name], L.August Saucer, MD ?Primary Cardiologist: Nahser ?Electrophysiologist: [name] ?12/12/2021 Weight: 205 lbs ?                                                         ?  ?Spoke with patient and heart failure questions reviewed.  Pt asymptomatic for fluid accumulation.  Reports feeling well at this time and voices no complaints. She does feel tightness in chest area when she has fluid. ?  ?Coruve thoracic impedance normal but was suggesting possible fluid accumulation from 3/13-3/23. ?  ?Prescribed: ?Furosemide 20 mg take 1 tablet daily. ?Potassium 20 mEq take 2 tablets daily. ?  ?Labs: ?01/03/2021 Creatinine 1.40, BUN 20, Potassium 4.5, Sodium 142, GFR 40 ?A complete set of results can be found in Results Review. ?  ?Recommendations:  No changes and encouraged to call if experiencing any fluid symptoms. ?  ?Follow-up plan: ICM clinic phone appointment on 01/12/2022.   91 day device clinic remote transmission 02/11/2022.   ?  ?EP/Cardiology Next Visit:  Advised to schedule routine OV with Dr [name] when she checks in to see Dr Elease Hashimoto on 4/24.   Recall 01/01/2022 with Dr [name].  12/29/2021 with Dr Elease Hashimoto. ?  ?Copy of ICM check sent to Dr. [name]. ? ?3 month ICM trend: 12/08/2021. ? ? ? ?12-14 Month ICM trend:  ? ? ? ?Karie Soda, RN ?12/12/2021 ?9:15 AM ? ?

## 2021-12-29 ENCOUNTER — Ambulatory Visit: Payer: Medicare HMO | Admitting: Cardiovascular Disease

## 2021-12-29 ENCOUNTER — Encounter: Payer: Self-pay | Admitting: Cardiovascular Disease

## 2021-12-29 VITALS — BP 144/88 | HR 61 | Ht 63.0 in | Wt 195.0 lb

## 2021-12-29 DIAGNOSIS — I2510 Atherosclerotic heart disease of native coronary artery without angina pectoris: Secondary | ICD-10-CM | POA: Diagnosis not present

## 2021-12-29 DIAGNOSIS — I5022 Chronic systolic (congestive) heart failure: Secondary | ICD-10-CM

## 2021-12-29 NOTE — Patient Instructions (Signed)
Medication Instructions:  ?Your physician recommends that you continue on your current medications as directed. Please refer to the Current Medication list given to you today. ? ?*If you need a refill on your cardiac medications before your next appointment, please call your pharmacy* ? ? ?Lab Work: ?NONE ?If you have labs (blood work) drawn today and your tests are completely normal, you will receive your results only by: ?MyChart Message (if you have MyChart) OR ?A paper copy in the mail ?If you have any lab test that is abnormal or we need to change your treatment, we will call you to review the results. ? ? ?Testing/Procedures: ?NONE ? ? ?Follow-Up: ?At [facility], you and your health needs are our priority.  As part of our continuing mission to provide you with exceptional heart care, we have created designated Provider Care Teams.  These Care Teams include your primary Cardiologist (physician) and Advanced Practice Providers (APPs -  Physician Assistants and Nurse Practitioners) who all work together to provide you with the care you need, when you need it. ? ?Your next appointment:   ?1 year(s) ? ?The format for your next appointment:   ?In Person ? ?Provider:   ?Philip Nahser, MD  or Vin Bhagat, PA-C, Michelle Swinyer, NP, or Scott Weaver, PA-C      ? ?  ? ?Important Information About Sugar ? ? ? ? ?  ?

## 2021-12-29 NOTE — Progress Notes (Signed)
? ?Cardiology Office Note ? ? ?Date:  12/29/2021  ? ?ID:  Lori Beard, DOB 10/18/1950, MRN [id number] ? ?PCP:  Alroy Dust, L.Marlou Sa, MD  ?Cardiologist:   Mertie Moores, MD  ? ?Chief Complaint  ?Patient presents with  ? Congestive Heart Failure  ? ? ?  ?  ?Lori Beard is a 71 y.o. female who presents for  ? ?1. Chronic systolic congestive heart failure ?2. Hypertension ?3 .obesty - s/p lap band surgery ?4. coronary artery disease-status post non-ST segment elevation myocardial infarction complicated by ventricular tachycardia ?5. Status post ICD placement 08/04/13 ? ? ?Previous notes ?Notes from [place] - 09/04/14: ? ?Lori Beard is a 71 y.o. female with a hx of HTN, obesity s/p LAP-BAND surgery and sleep apnea. She was admitted 07/2013 with pneumonia. She ruled in for a NSTEMI. Her hospitalization was complicated by monomorphic VTach that progressed to VFib due to R on T PVC in the setting of hypokalemia with a K of 2.9.  Echocardiogram (08/01/13): Mild LVH, global HK, EF 40-45%, Gr 1 DD, MAC, mild RVE, mildly reduced RVSF, PASP 31-35.  LHC (08/02/13):  Normal coronary arteries, EF 35%, LVEDP 17.  She was seen by EP and underwent ICD implantation for secondary prevention of malignant ventricular arrhythmias in the setting of nonischemic cardiomyopathy. She underwent St. [name] single-chamber defibrillator implantation by Dr. Lovena Le 08/04/13. ? ?I saw her 08/11/13.  She had resumed verapamil and metoprolol due to high blood pressures. I stopped these medications and placed on losartan and increased her BiDil to 3x per day.   ? ?Doing well since last seen.  The patient denies chest pain, shortness of breath, syncope, orthopnea, PND or significant pedal edema.  No ICD shocks.  ? ?Jan. 30, 2015: ? ?Mrs. Heitman presents today for followup evaluation of her hypertension and chronic systolic congestive heart failure. Her left ventricular ejection fraction is approximate 35%. She has a history of a cardiac  arrest and has had an ICD placed. She sees Dr. Cristopher Peru for management of that. ? ?12/27/2013: ? ?Doing well ? ?Oct. 26, 2015: ? ?She is only taking the BiDil twice a day.  He causes profound weakness and dizziness.  She was originally written to take it 3 times a day but she's not able to take it twice a day.  ? ?. Even still, he causes her to be very weak and she has to  lie down after taking it for the next 30 minutes to one hour. Otherwise she's doing well. He's not having any profound shortness of breath. No chest pain ? ?Jan. 26, 2016: ? ?Doing well.  No cp or dyspnea.   Is on hydralazine and ISDN instead of BiDIl now.   ? ?Jan. 19, 2017: ? ?Lori Beard is doing well. ?No CP or dyspnea.  ?Has CAD - hx of MI ?Has CHF and and ICD .  ? ?June 07, 2018: ?Lori Beard is seen today for follow-up visit of her coronary artery disease and ?Chronic systolic congestive heart failure.  Last echocardiogram was May 2019 which reveals left ventricular ejection fraction of 50%. ?She has mild pulmonary artery hypertension with an estimated PA pressure of 38 mmHg. ? ?Had a "hot sensation" yesterday  ?Had numbness in her left arm and left fingers.  ?Not associated with any chest pain .  ?Went to ER.  She was started on gabapentin which seems to have helped. ? ?Walks frequently .   No CP or dyspnea.    ? ?Sept.  15, 2020  ?Lori Beard is seen today for CAD and CHF ?Doing well.   Needs to restart her exercise ?No significant CP  ?Has ICD  ?Last echocardiogram from May 2019 reveals normal left ventricular systolic function with an ejection fraction of 50 to 55%. ? ?Feb. 16, 2022: ? ?Lori Beard is seen today for CAD and CHF ?Wt is 201 lbs ( down 15 lbs from her previous visit with me )  ?No cp or dyspnea.    ?Has a single chanber ICD for hx of VF.[name]  ?Able to do some exercise  ? ? ?December 29, 2021: ?Lori Beard is seen today for follow-up of her coronary artery disease and congestive heart failure. ?Weight today is 195 pounds. ?BP is a bit elevated. - has not  taken her meds today  ? ? ?Past Medical History:  ?Diagnosis Date  ? Aneurysm of right iliac artery (HCC)   ? Arthritis   ? ALL OVER  ? Cardiac arrest ([place]) 08/01/2013  ? Polymorphic VT  ? Chronic systolic CHF (congestive heart failure) ([place])   ? GERD (gastroesophageal reflux disease)   ? Hypertension   ? Morbid obesity ([place])   ? a. s/p lapband surgery 11/2012.  ? Myocardial infarction ([place]) 08/06/13  ? NICM (nonischemic cardiomyopathy) ([place])   ? Echocardiogram (08/01/13): Mild LVH, global HK, EF 40-45%, Gr 1 DD, MAC, mild RVE, mildly reduced RVSF, PASP 31-35.  LHC (08/02/13):  Normal coronary arteries, EF 35%, LVEDP 17.  ? Pneumonia 08/01/2013  ? Renal failure, acute ([place]) 08/01/2013  ? S/P implantation of automatic cardioverter/defibrillator (AICD)   ? 07/2013 Lovena Le)  ? Sleep apnea 02/15/2012  ? SLEEP STUDY IN EPIC - MILD OSA-CPAP NOT RECOMMENDED-HOME OXYGEN SUGGESTED BECAUSE OF OXYGEN DESATS ON ROOM AIR.  ? Ventricular tachycardia ([place])   ? a. R on T PVC during admission for pneumonia/NSTEMI => monomorphic VT=>VF=>defib; s/p ICD  ? ? ?Past Surgical History:  ?Procedure Laterality Date  ? ABDOMINAL HYSTERECTOMY    ? BREATH TEK H PYLORI  02/15/2012  ? Procedure: BREATH TEK H PYLORI;  Surgeon: Pedro Earls, MD;  Location: [facility];  Service: General;  Laterality: N/A;  745  ? CARDIAC CATHETERIZATION  08/02/2013  ? Normal coronary arteries, EF 35%   ? CHOLECYSTECTOMY    ? IMPLANTABLE CARDIOVERTER DEFIBRILLATOR IMPLANT N/A 08/04/2013  ? Procedure: IMPLANTABLE CARDIOVERTER DEFIBRILLATOR IMPLANT;  Surgeon: Evans Lance, MD;  Location: [facility] CATH LAB;  Service: Cardiovascular;  Laterality: N/A;  ? JOINT REPLACEMENT    ? BILATERAL TOTAL HIP REPLACEMENTS  ? KNEE ARTHROSCOPY    ? LAPAROSCOPIC GASTRIC BANDING    ? LEFT HEART CATHETERIZATION WITH CORONARY ANGIOGRAM N/A 08/02/2013  ? Procedure: LEFT HEART CATHETERIZATION WITH CORONARY ANGIOGRAM;  Surgeon: Wellington Hampshire, MD;  Location: [facility] LAB;  Service:  Cardiovascular;  Laterality: N/A;  ? MESH APPLIED TO LAP PORT N/A 11/22/2012  ? Procedure: MESH APPLIED TO LAP PORT;  Surgeon: Pedro Earls, MD;  Location: [facility];  Service: General;  Laterality: N/A;  ? PACEMAKER INSERTION   08/04/2013  ? St. [name] single chamber ICD  ? ? ? ?Current Outpatient Medications  ?Medication Sig Dispense Refill  ? amitriptyline (ELAVIL) 25 MG tablet Take 75 mg by mouth at bedtime.    ? aspirin 81 MG tablet Take 1 tablet (81 mg total) by mouth every evening.    ? atorvastatin (LIPITOR) 10 MG tablet Take 1 tablet by mouth once daily 90 tablet 0  ? carvedilol (COREG) 25 MG  tablet TAKE 1 TABLET BY MOUTH TWICE DAILY WITH A MEAL 180 tablet 3  ? cetirizine (ZYRTEC) 5 MG tablet Take 1 tablet (5 mg total) by mouth daily. 14 tablet 1  ? folic acid (FOLVITE) 1 MG tablet Take 1 mg by mouth every morning.    ? furosemide (LASIX) 20 MG tablet TAKE 1 TABLET BY MOUTH ONCE DAILY IN THE MORNING **  FOLLOW  UP  DUE  IN  MARCH  2022.  PLEASE  SCHEDULE** 90 tablet 2  ? hydrALAZINE (APRESOLINE) 25 MG tablet TAKE 1 TABLET BY MOUTH THREE TIMES DAILY 270 tablet 3  ? hydrocerin (EUCERIN) CREA Apply 1 application topically 2 (two) times daily. 454 g 0  ? HYDROcodone-acetaminophen (NORCO) 7.5-325 MG tablet Take 1 tablet by mouth every 8 (eight) hours as needed for moderate pain or severe pain.   0  ? isosorbide dinitrate (ISORDIL) 20 MG tablet Take 1 tablet by mouth twice daily 180 tablet 0  ? losartan (COZAAR) 50 MG tablet Take 1 tablet (50 mg total) by mouth daily. 30 tablet 2  ? pantoprazole (PROTONIX) 40 MG tablet Take 40 mg by mouth daily.    ? polyethylene glycol powder (GLYCOLAX/MIRALAX) 17 GM/SCOOP powder 1 Capful(s) By Mouth    ? potassium chloride SA (KLOR-CON M) 20 MEQ tablet Take 2 tablets (40 mEq total) by mouth daily. 60 tablet 1  ? triamcinolone ointment (KENALOG) 0.5 % Apply 1 application topically 2 (two) times daily. 30 g 0  ? Vitamin D, Ergocalciferol, (DRISDOL) 1.25 MG (50000 UNIT) CAPS capsule  Take 50,000 Units by mouth once a week.    ? zolpidem (AMBIEN) 5 MG tablet Take 5 mg by mouth at bedtime as needed.    ? ?No current facility-administered medications for this visit.  ? ? ?Allergies:   Iron, Ace inh

## 2022-01-12 ENCOUNTER — Ambulatory Visit (INDEPENDENT_AMBULATORY_CARE_PROVIDER_SITE_OTHER): Payer: Self-pay

## 2022-01-12 DIAGNOSIS — I5022 Chronic systolic (congestive) heart failure: Secondary | ICD-10-CM

## 2022-01-12 DIAGNOSIS — Z95810 Presence of automatic (implantable) cardiac defibrillator: Secondary | ICD-10-CM

## 2022-01-14 ENCOUNTER — Other Ambulatory Visit: Payer: Self-pay | Admitting: Physician Assistant

## 2022-01-14 ENCOUNTER — Other Ambulatory Visit: Payer: Self-pay | Admitting: Cardiovascular Disease

## 2022-01-14 DIAGNOSIS — I2510 Atherosclerotic heart disease of native coronary artery without angina pectoris: Secondary | ICD-10-CM

## 2022-01-15 ENCOUNTER — Telehealth: Payer: Self-pay

## 2022-01-15 NOTE — Progress Notes (Signed)
EPIC Encounter for ICM Monitoring ? ?Patient Name: Christyanna Mckeon is a 71 y.o. female ?Date: 01/15/2022 ?Primary Care Physican: [name], L.August Saucer, MD ?Primary Cardiologist: Nahser ?Electrophysiologist: [name] ?12/12/2021 Weight: 205 lbs ?                                                         ?  ?Attempted call to patient and unable to reach.  Left detailed message per DPR regarding transmission. Transmission reviewed.  ?  ?Coruve thoracic impedance suggesting possible dryness starting 01/10/2022 but returned to normal 5/9.   Impedance was suggesting possible fluid accumulation from 4/17-4/22. ?  ?Prescribed: ?Furosemide 20 mg take 1 tablet daily. ?Potassium 20 mEq take 2 tablets daily. ?  ?Labs: ?01/03/2021 Creatinine 1.40, BUN 20, Potassium 4.5, Sodium 142, GFR 40 ?A complete set of results can be found in Results Review. ?  ?Recommendations:  Left voice mail with ICM number and encouraged to call if experiencing any fluid symptoms. ?  ?Follow-up plan: ICM clinic phone appointment on 02/16/2022.   91 day device clinic remote transmission 02/11/2022.   ?  ?EP/Cardiology Next Visit:   Recall 01/01/2022 with Dr [name].   Recall 12/24/2022 with APP for Dr Elease Hashimoto. ?  ?Copy of ICM check sent to Dr. [name]. ? ?3 month ICM trend: 01/12/2022. ? ? ? ? ? ?12-14 Month ICM trend:  ? ? ? ?Karie Soda, RN ?01/15/2022 ?2:59 PM ? ?

## 2022-01-15 NOTE — Telephone Encounter (Signed)
Remote ICM transmission received.  Attempted call to patient regarding ICM remote transmission and left detailed message per DPR.  Advised to return call for any fluid symptoms or questions. Next ICM remote transmission scheduled 02/16/2022.   ? ?

## 2022-02-10 ENCOUNTER — Other Ambulatory Visit: Payer: Self-pay | Admitting: Internal Medicine

## 2022-02-10 ENCOUNTER — Other Ambulatory Visit: Payer: Self-pay | Admitting: Cardiovascular Disease

## 2022-02-10 MED ORDER — POTASSIUM CHLORIDE CRYS ER 20 MEQ PO TBCR: 40.0000 meq | EXTENDED_RELEASE_TABLET | Freq: Every day | ORAL | 3 refills | Status: DC

## 2022-02-11 ENCOUNTER — Ambulatory Visit (INDEPENDENT_AMBULATORY_CARE_PROVIDER_SITE_OTHER): Payer: Medicare HMO

## 2022-02-11 DIAGNOSIS — I428 Other cardiomyopathies: Secondary | ICD-10-CM

## 2022-02-13 LAB — CUP PACEART REMOTE DEVICE CHECK
Battery Remaining Longevity: 35 mo
Battery Remaining Percentage: 33 %
Battery Voltage: 2.86 V
Brady Statistic RV Percent Paced: 1 %
Date Time Interrogation Session: 20230607020024
HighPow Impedance: 73 Ohm
HighPow Impedance: 73 Ohm
Implantable Lead Implant Date: 20141128
Implantable Lead Location: 753860
Implantable Lead Model: 7122
Implantable Pulse Generator Implant Date: 20141128
Lead Channel Impedance Value: 460 Ohm
Lead Channel Pacing Threshold Amplitude: 1 V
Lead Channel Pacing Threshold Pulse Width: 0.5 ms
Lead Channel Sensing Intrinsic Amplitude: 8.8 mV
Lead Channel Setting Pacing Amplitude: 2.5 V
Lead Channel Setting Pacing Pulse Width: 0.5 ms
Lead Channel Setting Sensing Sensitivity: 0.5 mV
Pulse Gen Serial Number: 7136396

## 2022-02-16 ENCOUNTER — Ambulatory Visit (INDEPENDENT_AMBULATORY_CARE_PROVIDER_SITE_OTHER): Payer: Medicare HMO

## 2022-02-16 DIAGNOSIS — I5022 Chronic systolic (congestive) heart failure: Secondary | ICD-10-CM | POA: Diagnosis not present

## 2022-02-16 DIAGNOSIS — Z95810 Presence of automatic (implantable) cardiac defibrillator: Secondary | ICD-10-CM | POA: Diagnosis not present

## 2022-02-18 ENCOUNTER — Telehealth: Payer: Self-pay

## 2022-02-18 NOTE — Progress Notes (Signed)
EPIC Encounter for ICM Monitoring  Patient Name: Lori Beard is a 71 y.o. female Date: 02/18/2022 Primary Care Physican: [name], L.August Saucer, MD Primary Cardiologist: Nahser Electrophysiologist: [name] 12/12/2021 Weight: 205 lbs                                                            Attempted call to patient and unable to reach.  Left detailed message per DPR regarding transmission. Transmission reviewed.    Coruve thoracic impedance suggesting possible dryness from 6/1-6/10.   Impedance was suggesting possible fluid accumulation from 5/16-5/27.   Prescribed: Furosemide 20 mg take 1 tablet daily. Potassium 20 mEq take 2 tablets daily.   Labs: 01/03/2021 Creatinine 1.40, BUN 20, Potassium 4.5, Sodium 142, GFR 40 A complete set of results can be found in Results Review.   Recommendations:  Left voice mail with ICM number and encouraged to call if experiencing any fluid symptoms.   Follow-up plan: ICM clinic phone appointment on 03/23/2022.   91 day device clinic remote transmission 05/13/2022.     EP/Cardiology Next Visit:   Recall 01/01/2022 with Dr [name].   Recall 12/24/2022 with APP for Dr Elease Hashimoto.   Copy of ICM check sent to Dr. [name].   3 month ICM trend: 02/16/2022.    12-14 Month ICM trend:     Karie Soda, RN 02/18/2022 4:50 PM

## 2022-02-18 NOTE — Telephone Encounter (Signed)
Remote ICM transmission received.  Attempted call to patient regarding ICM remote transmission and left detailed message per DPR.  Advised to return call for any fluid symptoms or questions. Next ICM remote transmission scheduled 03/23/2022.    

## 2022-02-20 NOTE — Progress Notes (Signed)
Remote ICD transmission.   

## 2022-03-18 ENCOUNTER — Other Ambulatory Visit: Payer: Self-pay | Admitting: Family Medicine

## 2022-03-18 DIAGNOSIS — I723 Aneurysm of iliac artery: Secondary | ICD-10-CM

## 2022-03-23 ENCOUNTER — Ambulatory Visit (INDEPENDENT_AMBULATORY_CARE_PROVIDER_SITE_OTHER): Payer: Medicare HMO

## 2022-03-23 DIAGNOSIS — I5022 Chronic systolic (congestive) heart failure: Secondary | ICD-10-CM | POA: Diagnosis not present

## 2022-03-23 DIAGNOSIS — Z95810 Presence of automatic (implantable) cardiac defibrillator: Secondary | ICD-10-CM | POA: Diagnosis not present

## 2022-03-24 NOTE — Progress Notes (Signed)
EPIC Encounter for ICM Monitoring  Patient Name: Lori Beard is a 71 y.o. female Date: 03/24/2022 Primary Care Physican: [name], L.August Saucer, MD Primary Cardiologist: Nahser Electrophysiologist: [name] 03/24/2022 Weight: 200 lbs                                                            Spoke with patient and heart failure questions reviewed.  Pt asymptomatic for fluid accumulation.  Reports feeling well at this time and voices no complaints.  She did eat some canned greens that was salty and may have contributed to possible fluid accumulation.  She is compliant with taking Furosemide daily.  No changes in weight.   Coruve thoracic impedance suggesting possible fluid accumulation starting 7/16.   Prescribed: Furosemide 20 mg take 1 tablet daily. Potassium 20 mEq take 2 tablets daily.   Labs: 01/03/2021 Creatinine 1.40, BUN 20, Potassium 4.5, Sodium 142, GFR 40 A complete set of results can be found in Results Review.   Recommendations:  Recommendation to limit salt intake to 2000 mg daily.  Encouraged to call if experiencing any fluid symptoms.    Follow-up plan: ICM clinic phone appointment on 04/06/2022 to recheck fluid levels.   91 day device clinic remote transmission 05/13/2022.     EP/Cardiology Next Visit:   Recall 01/01/2022 with Dr [name].   Recall 12/24/2022 with APP for Dr Elease Hashimoto.   Copy of ICM check sent to Dr. [name] and Dr Elease Hashimoto for review.   3 month ICM trend: 03/23/2022.    12-14 Month ICM trend:     Karie Soda, RN 03/24/2022 9:21 AM

## 2022-04-06 ENCOUNTER — Ambulatory Visit (INDEPENDENT_AMBULATORY_CARE_PROVIDER_SITE_OTHER): Payer: Medicare HMO

## 2022-04-06 DIAGNOSIS — Z95810 Presence of automatic (implantable) cardiac defibrillator: Secondary | ICD-10-CM

## 2022-04-06 DIAGNOSIS — I5022 Chronic systolic (congestive) heart failure: Secondary | ICD-10-CM

## 2022-04-09 NOTE — Progress Notes (Signed)
EPIC Encounter for ICM Monitoring  Patient Name: Lori Beard is a 72 y.o. female Date: 04/09/2022 Primary Care Physican: [name], L.August Saucer, MD Primary Cardiologist: Nahser Electrophysiologist: [name] 03/24/2022 Weight: 200 lbs                                                            Spoke with patient and heart failure questions reviewed.  Pt asymptomatic for fluid accumulation.  Reports feeling well at this time and voices no complaints.    Coruve thoracic impedance suggesting fluid levels returned to normal.   Prescribed: Furosemide 20 mg take 1 tablet daily. Potassium 20 mEq take 2 tablets daily.   Labs: 01/03/2021 Creatinine 1.40, BUN 20, Potassium 4.5, Sodium 142, GFR 40 A complete set of results can be found in Results Review.   Recommendations:  No changes and encouraged to call if experiencing any fluid symptoms.    Follow-up plan: ICM clinic phone appointment on 05/04/2022.   91 day device clinic remote transmission 05/13/2022.     EP/Cardiology Next Visit:   Recall 01/01/2022 with Dr [name].   Recall 12/24/2022 with APP for Dr Elease Hashimoto.   Copy of ICM check sent to Dr. [name]  3 month ICM trend: 04/06/2022.    12-14 Month ICM trend:     Karie Soda, RN 04/09/2022 8:44 AM

## 2022-04-16 ENCOUNTER — Inpatient Hospital Stay: Admission: RE | Admit: 2022-04-16 | Payer: Medicare HMO | Source: Ambulatory Visit

## 2022-04-22 ENCOUNTER — Ambulatory Visit
Admission: RE | Admit: 2022-04-22 | Discharge: 2022-04-22 | Disposition: A | Discharge status: Home / Self Care | Payer: Medicare HMO | Source: Ambulatory Visit | Attending: Family Medicine | Admitting: Family Medicine

## 2022-04-22 DIAGNOSIS — I723 Aneurysm of iliac artery: Secondary | ICD-10-CM

## 2022-04-22 MED ORDER — IOPAMIDOL (ISOVUE-300) INJECTION 61%
80.0000 mL | Freq: Once | INTRAVENOUS | Status: AC | PRN
  Administered 2022-04-22: 80 mL via INTRAVENOUS

## 2022-05-04 ENCOUNTER — Ambulatory Visit (INDEPENDENT_AMBULATORY_CARE_PROVIDER_SITE_OTHER): Payer: Medicare HMO

## 2022-05-04 DIAGNOSIS — Z95810 Presence of automatic (implantable) cardiac defibrillator: Secondary | ICD-10-CM

## 2022-05-04 DIAGNOSIS — I5022 Chronic systolic (congestive) heart failure: Secondary | ICD-10-CM | POA: Diagnosis not present

## 2022-05-07 ENCOUNTER — Ambulatory Visit: Payer: Self-pay | Admitting: Surgery

## 2022-05-07 ENCOUNTER — Telehealth: Payer: Self-pay | Admitting: *Deleted

## 2022-05-07 NOTE — H&P (Signed)
Lori Beard [id number]   Referring Provider:  Irven Coe, MD   Subjective   Chief Complaint: No chief complaint on file.     History of Present Illness:    Very pleasant 71 year old woman with history of peripheral vascular disease, right iliac artery aneurysm, history of chronic systolic heart failure following NSTEMI with cardiac arrest in 2014, ICD in place with history of ventricular arrhythmias in the setting of nonischemic cardiomyopathy, hypertension, sleep apnea, morbid obesity status post lap band, who presents for evaluation of a right inguinal hernia.  She noticed this first about a year ago.  At that time it was not bothering her and she was advised to let it be.  Recently however she has had more discomfort in the area, frequently she will feel "bubbling" in the area, and has had a few episodes of sharp severe pain in the right groin related to this.  Denies any obstructive symptoms or change in bowel habits.   Review of abdominal surgery includes lap band placement, hysterectomy, cholecystectomy.   Review of Systems: A complete review of systems was obtained from the patient.  I have reviewed this information and discussed as appropriate with the patient.  See HPI as well for other ROS.   Medical History: Past Medical History:  Diagnosis Date   Arthritis     There is no problem list on file for this patient.   History reviewed. No pertinent surgical history.   Allergies  Allergen Reactions   Iron Other (See Comments)    Gives patient migraine headaches when supplemented   Morphine Itching and Rash    Current Outpatient Medications on File Prior to Visit  Medication Sig Dispense Refill   amitriptyline (ELAVIL) 25 MG tablet TAKE 3 TABLETS BY MOUTH ONCE DAILY AT BEDTIME     atorvastatin (LIPITOR) 10 MG tablet Take 10 mg by mouth once daily     carvediloL (COREG) 25 MG tablet TAKE 1 TABLET BY MOUTH TWICE DAILY WITH A MEAL     ergocalciferol, vitamin D2,  1,250 mcg (50,000 unit) capsule Take 50,000 Units by mouth every 7 (seven) days     FUROsemide (LASIX) 20 MG tablet Take 1 tablet by mouth every morning     hydrALAZINE (APRESOLINE) 25 MG tablet Take 25 mg by mouth 3 (three) times daily     HYDROcodone-acetaminophen (NORCO) 7.5-325 mg tablet      losartan (COZAAR) 50 MG tablet Take 50 mg by mouth once daily     potassium chloride (KLOR-CON) 20 MEQ ER tablet Take by mouth     zolpidem (AMBIEN) 5 MG tablet Take 5 mg by mouth at bedtime as needed for Sleep     No current facility-administered medications on file prior to visit.    History reviewed. No pertinent family history.   Social History   Tobacco Use  Smoking Status Never  Smokeless Tobacco Not on file     Social History   Socioeconomic History   Marital status: Married  Tobacco Use   Smoking status: Never  Substance and Sexual Activity   Alcohol use: Not Currently   Drug use: Never    Objective:    There were no vitals filed for this visit.  There is no height or weight on file to calculate BMI.  Alert and well-appearing Unlabored respirations Abdomen soft and nontender, obese.  There is a partially reducible, nontender right inguinal hernia.  CT from 04/23/2022 reviewed personally by me demonstrates a right inguinal hernia containing  nonobstructed small bowel.  Large stool burden.  Assessment and Plan:  Diagnoses and all orders for this visit:  Non-recurrent unilateral inguinal hernia without obstruction or gangrene  Increasingly symptomatic and contained small bowel on most recent CT.  I recommend proceeding with a robotic repair.  We discussed the surgery in detail including relevant anatomy and the technique of repair.  Discussed the use of mesh.  We discussed the risks of bleeding, infection, pain, scarring, injury to intra-abdominal or retroperitoneal structures, chronic pain, hernia recurrence, as well as risk of cardiovascular complications such as  arrhythmia or MI, pneumonia, stroke, blood clot etc.  Questions welcomed and answered to her satisfaction.  She wishes to proceed with surgery.  We will request clearance from Dr. Elease Hashimoto and schedule at the patient's convenience.  I also went over signs and symptoms that she should be alert to should she develop obstruction or strangulation, that should prompt her to seek emergency treatment between now and the time of her elective repair.  Lenord Fralix Carlye Grippe, MD

## 2022-05-07 NOTE — Progress Notes (Signed)
EPIC Encounter for ICM Monitoring  Patient Name: Lori Beard is a 71 y.o. female Date: 05/07/2022 Primary Care Physican: [name], L.August Saucer, MD Primary Cardiologist: Nahser Electrophysiologist: [name] 03/24/2022 Weight: 200 lbs                                                            Spoke with patient and heart failure questions reviewed.  Pt asymptomatic for fluid accumulation.  Reports feeling well at this time and voices no complaints.    Coruve thoracic impedance normal but was suggesting possible fluid accumulation from 8/7-8/16.   Prescribed: Furosemide 20 mg take 1 tablet daily. Potassium 20 mEq take 2 tablets daily.   Labs: 01/03/2021 Creatinine 1.40, BUN 20, Potassium 4.5, Sodium 142, GFR 40 A complete set of results can be found in Results Review.   Recommendations:  No changes and encouraged to call if experiencing any fluid symptoms.   Follow-up plan: ICM clinic phone appointment on 06/08/2022.   91 day device clinic remote transmission 05/13/2022.     EP/Cardiology Next Visit:   Advised she overdue for office visit with Dr [name] and will have EP scheduler call her to set up appointment.  Recall 01/01/2022 with Dr [name].   Recall 12/24/2022 with APP for Dr Elease Hashimoto.   Copy of ICM check sent to Dr. [name].  3 month ICM trend: 05/04/2022.    12-14 Month ICM trend:     Karie Soda, RN 05/07/2022 8:30 AM

## 2022-05-07 NOTE — Telephone Encounter (Signed)
   Pre-operative Risk Assessment    Patient Name: Lori Beard  DOB: 1951/02/04 MRN: [id number]      Request for Surgical Clearance    Procedure:   HERNIA SURGERY  Date of Surgery:  Clearance TBD                                 Surgeon:  DR. Phylliss Blakes Surgeon's Group or Practice Name:  [facility] Phone number:  [phone] Fax number:  [phone] ATTN: Brennan Bailey, CMA   Type of Clearance Requested:   - Medical ; ASA    Type of Anesthesia:  General    Additional requests/questions:    Elpidio Anis   05/07/2022, 12:59 PM

## 2022-05-08 ENCOUNTER — Telehealth: Payer: Self-pay | Admitting: *Deleted

## 2022-05-08 NOTE — Telephone Encounter (Signed)
Pt agreeable to plan of care for tele pre op appt 05/19/22 @ 10 am. Med rec and consent are done.      Patient Consent for Virtual Visit        Lori Beard has provided verbal consent on 05/08/2022 for a virtual visit (video or telephone).   CONSENT FOR VIRTUAL VISIT FOR:  Lori Beard  By participating in this virtual visit I agree to the following:  I hereby voluntarily request, consent and authorize [facility] and its employed or contracted physicians, physician assistants, nurse practitioners or other licensed health care professionals (the Practitioner), to provide me with telemedicine health care services (the "Services") as deemed necessary by the treating Practitioner. I acknowledge and consent to receive the Services by the Practitioner via telemedicine. I understand that the telemedicine visit will involve communicating with the Practitioner through live audiovisual communication technology and the disclosure of certain medical information by electronic transmission. I acknowledge that I have been given the opportunity to request an in-person assessment or other available alternative prior to the telemedicine visit and am voluntarily participating in the telemedicine visit.  I understand that I have the right to withhold or withdraw my consent to the use of telemedicine in the course of my care at any time, without affecting my right to future care or treatment, and that the Practitioner or I may terminate the telemedicine visit at any time. I understand that I have the right to inspect all information obtained and/or recorded in the course of the telemedicine visit and may receive copies of available information for a reasonable fee.  I understand that some of the potential risks of receiving the Services via telemedicine include:  Delay or interruption in medical evaluation due to technological equipment failure or disruption; Information transmitted may not be  sufficient (e.g. poor resolution of images) to allow for appropriate medical decision making by the Practitioner; and/or  In rare instances, security protocols could fail, causing a breach of personal health information.  Furthermore, I acknowledge that it is my responsibility to provide information about my medical history, conditions and care that is complete and accurate to the best of my ability. I acknowledge that Practitioner's advice, recommendations, and/or decision may be based on factors not within their control, such as incomplete or inaccurate data provided by me or distortions of diagnostic images or specimens that may result from electronic transmissions. I understand that the practice of medicine is not an exact science and that Practitioner makes no warranties or guarantees regarding treatment outcomes. I acknowledge that a copy of this consent can be made available to me via my patient portal [facility] MyChart), or I can request a printed copy by calling the office of [facility].    I understand that my insurance will be billed for this visit.   I have read or had this consent read to me. I understand the contents of this consent, which adequately explains the benefits and risks of the Services being provided via telemedicine.  I have been provided ample opportunity to ask questions regarding this consent and the Services and have had my questions answered to my satisfaction. I give my informed consent for the services to be provided through the use of telemedicine in my medical care

## 2022-05-08 NOTE — Telephone Encounter (Signed)
Please set up virtual telephone visit for cardiac clearance 

## 2022-05-08 NOTE — Telephone Encounter (Signed)
Pt agreeable to plan of care for tele pre op appt 05/19/22 @ 10 am. Med rec and consent are done.

## 2022-05-13 ENCOUNTER — Ambulatory Visit (INDEPENDENT_AMBULATORY_CARE_PROVIDER_SITE_OTHER): Payer: Medicare HMO

## 2022-05-13 DIAGNOSIS — I428 Other cardiomyopathies: Secondary | ICD-10-CM | POA: Diagnosis not present

## 2022-05-13 LAB — CUP PACEART REMOTE DEVICE CHECK
Battery Remaining Longevity: 30 mo
Battery Remaining Percentage: 29 %
Battery Voltage: 2.83 V
Brady Statistic RV Percent Paced: 1 %
Date Time Interrogation Session: 20230904020014
HighPow Impedance: 71 Ohm
HighPow Impedance: 71 Ohm
Implantable Lead Implant Date: 20141128
Implantable Lead Location: 753860
Implantable Lead Model: 7122
Implantable Pulse Generator Implant Date: 20141128
Lead Channel Impedance Value: 450 Ohm
Lead Channel Pacing Threshold Amplitude: 1 V
Lead Channel Pacing Threshold Pulse Width: 0.5 ms
Lead Channel Sensing Intrinsic Amplitude: 12 mV
Lead Channel Setting Pacing Amplitude: 2.5 V
Lead Channel Setting Pacing Pulse Width: 0.5 ms
Lead Channel Setting Sensing Sensitivity: 0.5 mV
Pulse Gen Serial Number: 7136396

## 2022-05-18 NOTE — Progress Notes (Unsigned)
Virtual Visit via Telephone Note   Because of Lori Beard's co-morbid illnesses, she is at least at moderate risk for complications without adequate follow up.  This format is felt to be most appropriate for this patient at this time.  The patient did not have access to video technology/had technical difficulties with video requiring transitioning to audio format only (telephone).  All issues noted in this document were discussed and addressed.  No physical exam could be performed with this format.  Please refer to the patient's chart for her consent to telehealth for [facility].  Evaluation Performed:  Preoperative cardiovascular risk assessment _____________   Date:  05/18/2022   Patient ID:  Lori, Beard October 03, 1950, MRN [id number] Patient Location:  Home Provider location:   Office  Primary Care Provider:  Alroy Dust, L.Marlou Sa, MD Primary Cardiologist:  Mertie Moores, MD  Chief Complaint / Patient Profile   71 y.o. y/o female with a h/o HTN, CAD s/p NSTEMI c/b VT, chronic HFrEF s/p ICD placement 07/2013, sleep apnea, obesity who is pending hernia surgery and presents today for telephonic preoperative cardiovascular risk assessment.  Past Medical History    Past Medical History:  Diagnosis Date   Aneurysm of right iliac artery (HCC)    Arthritis    ALL OVER   Cardiac arrest ([place]) 08/01/2013   Polymorphic VT   Chronic systolic CHF (congestive heart failure) (HCC)    GERD (gastroesophageal reflux disease)    Hypertension    Morbid obesity ([place])    a. s/p lapband surgery 11/2012.   Myocardial infarction [facility]) 08/06/13   NICM (nonischemic cardiomyopathy) ([place])    Echocardiogram (08/01/13): Mild LVH, global HK, EF 40-45%, Gr 1 DD, MAC, mild RVE, mildly reduced RVSF, PASP 31-35.  LHC (08/02/13):  Normal coronary arteries, EF 35%, LVEDP 17.   Pneumonia 08/01/2013   Renal failure, acute ([place]) 08/01/2013   S/P implantation of automatic  cardioverter/defibrillator (AICD)    07/2013 Lori Beard)   Sleep apnea 02/15/2012   SLEEP STUDY IN EPIC - MILD OSA-CPAP NOT RECOMMENDED-HOME OXYGEN SUGGESTED BECAUSE OF OXYGEN DESATS ON ROOM AIR.   Ventricular tachycardia ([place])    a. R on T PVC during admission for pneumonia/NSTEMI => monomorphic VT=>VF=>defib; s/p ICD   Past Surgical History:  Procedure Laterality Date   ABDOMINAL HYSTERECTOMY     BREATH TEK H PYLORI  02/15/2012   Procedure: BREATH TEK H PYLORI;  Surgeon: Pedro Earls, MD;  Location: [facility];  Service: General;  Laterality: N/A;  [place]  08/02/2013   Normal coronary arteries, EF 35%    CHOLECYSTECTOMY     IMPLANTABLE CARDIOVERTER DEFIBRILLATOR IMPLANT N/A 08/04/2013   Procedure: IMPLANTABLE CARDIOVERTER DEFIBRILLATOR IMPLANT;  Surgeon: Evans Lance, MD;  Location: [facility] CATH LAB;  Service: Cardiovascular;  Laterality: N/A;   JOINT REPLACEMENT     BILATERAL TOTAL HIP REPLACEMENTS   KNEE ARTHROSCOPY     LAPAROSCOPIC GASTRIC BANDING     LEFT HEART CATHETERIZATION WITH CORONARY ANGIOGRAM N/A 08/02/2013   Procedure: LEFT HEART CATHETERIZATION WITH CORONARY ANGIOGRAM;  Surgeon: Wellington Hampshire, MD;  Location: [facility] CATH LAB;  Service: Cardiovascular;  Laterality: N/A;   MESH APPLIED TO LAP PORT N/A 11/22/2012   Procedure: MESH APPLIED TO LAP PORT;  Surgeon: Pedro Earls, MD;  Location: [facility] ORS;  Service: General;  Laterality: N/A;   PACEMAKER INSERTION   08/04/2013   St. [name] single chamber ICD    Allergies  Allergies  Allergen  Reactions   Iron Other (See Comments)    Gives patient migraine headaches when supplemented   Ace Inhibitors Cough   Etodolac     Other reaction(s): itching   Morphine And Related Itching and Rash    History of Present Illness    Lori Beard is a 71 y.o. female who presents via audio/video conferencing for a telehealth visit today.  Pt was last seen in cardiology clinic on 12/29/21 by Dr. Acie Fredrickson.  At that  time Lori Beard was doing well.  The patient is now pending procedure as outlined above. Since her last visit, she *** denies chest pain, shortness of breath, lower extremity edema, fatigue, palpitations, melena, hematuria, hemoptysis, diaphoresis, weakness, presyncope, syncope, orthopnea, and PND.    Home Medications    Prior to Admission medications   Medication Sig Start Date End Date Taking? Authorizing Provider  amitriptyline (ELAVIL) 25 MG tablet Take 75 mg by mouth at bedtime. 04/12/12   Rise Mu, PA-C  aspirin 81 MG tablet Take 1 tablet (81 mg total) by mouth every evening. 10/06/13   Nahser, Wonda Cheng, MD  atorvastatin (LIPITOR) 10 MG tablet Take 1 tablet by mouth once daily 01/14/22   Nahser, Wonda Cheng, MD  carvedilol (COREG) 25 MG tablet TAKE 1 TABLET BY MOUTH TWICE DAILY WITH A MEAL 02/10/22   Nahser, Wonda Cheng, MD  cetirizine (ZYRTEC) 5 MG tablet Take 1 tablet (5 mg total) by mouth daily. 05/04/17   Ivar Drape D, PA  folic acid (FOLVITE) 1 MG tablet Take 1 mg by mouth every morning. 11/14/21   [provider]  furosemide (LASIX) 20 MG tablet TAKE 1 TABLET BY MOUTH IN THE MORNING 02/10/22   Nahser, Wonda Cheng, MD  hydrALAZINE (APRESOLINE) 25 MG tablet TAKE 1 TABLET BY MOUTH THREE TIMES DAILY 01/14/22   Nahser, Wonda Cheng, MD  hydrocerin (EUCERIN) CREA Apply 1 application topically 2 (two) times daily. 01/23/18   Samuella Cota, MD  HYDROcodone-acetaminophen (NORCO) 7.5-325 MG tablet Take 1 tablet by mouth every 8 (eight) hours as needed for moderate pain or severe pain.  01/07/16   [provider]  isosorbide dinitrate (ISORDIL) 20 MG tablet Take 1 tablet by mouth twice daily 11/24/21   Nahser, Wonda Cheng, MD  losartan (COZAAR) 50 MG tablet Take 1 tablet (50 mg total) by mouth daily. 01/09/18   Roxan Hockey, MD  pantoprazole (PROTONIX) 40 MG tablet Take 40 mg by mouth daily. 09/12/19   [provider]  polyethylene glycol powder (GLYCOLAX/MIRALAX) 17  GM/SCOOP powder 1 Capful(s) By Mouth 11/21/21   [provider]  potassium chloride SA (KLOR-CON M) 20 MEQ tablet Take 2 tablets (40 mEq total) by mouth daily. 02/10/22   Nahser, Wonda Cheng, MD  triamcinolone ointment (KENALOG) 0.5 % Apply 1 application topically 2 (two) times daily. 01/18/18   Harrison Mons, PA  Vitamin D, Ergocalciferol, (DRISDOL) 1.25 MG (50000 UNIT) CAPS capsule Take 50,000 Units by mouth once a week. 12/24/21   [provider]  zolpidem (AMBIEN) 5 MG tablet Take 5 mg by mouth at bedtime as needed. 12/24/21   [provider]    Physical Exam    Vital Signs:  Lori Beard does not have vital signs available for review today.***  Given telephonic nature of communication, physical exam is limited. AAOx3. NAD. Normal affect.  Speech and respirations are unlabored.  Accessory Clinical Findings    None  Assessment & Plan    1.  Preoperative  Cardiovascular Risk Assessment:  Ideally aspirin should be continued without interruption, however if the bleeding risk is too great, aspirin may be held for 7 days prior to surgery. Please resume aspirin post operatively when it is felt to be safe from a bleeding standpoint.    A copy of this note will be routed to requesting surgeon.  Time:   Today, I have spent *** minutes with the patient with telehealth technology discussing medical history, symptoms, and management plan.     Emmaline Life, NP-C  05/18/2022, 7:07 PM [address] Office [phone] Fax [phone]

## 2022-05-19 ENCOUNTER — Ambulatory Visit: Payer: Medicare HMO | Attending: Internal Medicine | Admitting: Nurse Practitioner

## 2022-05-19 DIAGNOSIS — Z01810 Encounter for preprocedural cardiovascular examination: Secondary | ICD-10-CM

## 2022-05-21 NOTE — Progress Notes (Unsigned)
Cardiology Office Note Date:  05/21/2022  Patient ID:  Lori Beard, Lori Beard 06-02-1951, MRN [id number] PCP:  Alroy Dust, Carlean Jews.Marlou Sa, MD  Cardiologist:  Dr. Acie Fredrickson Electrophysiologist: Dr. Lovena Le   Chief Complaint:  *** annual visit  History of Present Illness: Lori Beard is a 71 y.o. female with history of HTN, obesity (s/p LAP-BAND surgery), GERD, NICM, chronic CHF (systolic), pt denies the diagnosis of OSA   07/2013 admitted with pneumonia. She ruled in for a NSTEMI. Her hospitalization was complicated by monomorphic VTach that progressed to VFib due to R on T PVC in the setting of hypokalemia with a K of 2.9.  Echocardiogram (08/01/13): Mild LVH, global HK, EF 40-45%, Gr 1 DD, MAC, mild RVE, mildly reduced RVSF, PASP 31-35.  LHC (08/02/13):  Normal coronary arteries, EF 35%, LVEDP 17.  She was seen by EP and underwent ICD implantation for secondary prevention of malignant ventricular arrhythmias in the setting of nonischemic cardiomyopathy.  She last saw Dr. Lovena Le April 2021  I saw her 01/06/21 She is doing very well. 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 No CP, palpitations or SOB.  Denies any DOE. No dizzy spells, near syncope or syncope. She has lost a couple pounds, thinks this is being up and about more Denies edema, the only time she gets swollen is if she falls asleep in her chair with her feet down. CorVue was down, but exam did not support volume OL No changes were made, planned to c/w cardiology team and see EP again I a year, sooner if needed.  She saw Dr. Acie Fredrickson April 2023 doing well, described class II symptoms, doing well, BP a bit elevated but had not taken her meds yet, no changes were made.  Pending R inguinal hernia surgery Needs clearance  RCRI score is 1, 0.9% Though not account for her hx of c.arrest  *** symptoms *** volume *** labs, lytes, meds, CM *** pre-op ischemic eval?, update her echo, last ischemic eval 2014, last echo  2019 *** Defer to gen cards?   Device information SJM single chamber ICD implanted 08/04/2013 Secondary prevention  Past Medical History:  Diagnosis Date   Aneurysm of right iliac artery (HCC)    Arthritis    ALL OVER   Cardiac arrest ([place]) 08/01/2013   Polymorphic VT   Chronic systolic CHF (congestive heart failure) (HCC)    GERD (gastroesophageal reflux disease)    Hypertension    Morbid obesity ([place])    a. s/p lapband surgery 11/2012.   Myocardial infarction [facility]) 08/06/13   NICM (nonischemic cardiomyopathy) ([place])    Echocardiogram (08/01/13): Mild LVH, global HK, EF 40-45%, Gr 1 DD, MAC, mild RVE, mildly reduced RVSF, PASP 31-35.  LHC (08/02/13):  Normal coronary arteries, EF 35%, LVEDP 17.   Pneumonia 08/01/2013   Renal failure, acute ([place]) 08/01/2013   S/P implantation of automatic cardioverter/defibrillator (AICD)    07/2013 Lovena Le)   Sleep apnea 02/15/2012   SLEEP STUDY IN EPIC - MILD OSA-CPAP NOT RECOMMENDED-HOME OXYGEN SUGGESTED BECAUSE OF OXYGEN DESATS ON ROOM AIR.   Ventricular tachycardia ([place])    a. R on T PVC during admission for pneumonia/NSTEMI => monomorphic VT=>VF=>defib; s/p ICD    Past Surgical History:  Procedure Laterality Date   ABDOMINAL HYSTERECTOMY     BREATH TEK H PYLORI  02/15/2012   Procedure: BREATH TEK H PYLORI;  Surgeon: Pedro Earls, MD;  Location: [facility];  Service: General;  Laterality: N/A;  745  CARDIAC CATHETERIZATION  08/02/2013   Normal coronary arteries, EF 35%    CHOLECYSTECTOMY     IMPLANTABLE CARDIOVERTER DEFIBRILLATOR IMPLANT N/A 08/04/2013   Procedure: IMPLANTABLE CARDIOVERTER DEFIBRILLATOR IMPLANT;  Surgeon: Evans Lance, MD;  Location: [facility] CATH LAB;  Service: Cardiovascular;  Laterality: N/A;   JOINT REPLACEMENT     BILATERAL TOTAL HIP REPLACEMENTS   KNEE ARTHROSCOPY     LAPAROSCOPIC GASTRIC BANDING     LEFT HEART CATHETERIZATION WITH CORONARY ANGIOGRAM N/A 08/02/2013   Procedure: LEFT HEART CATHETERIZATION WITH  CORONARY ANGIOGRAM;  Surgeon: Wellington Hampshire, MD;  Location: [facility] CATH LAB;  Service: Cardiovascular;  Laterality: N/A;   MESH APPLIED TO LAP PORT N/A 11/22/2012   Procedure: MESH APPLIED TO LAP PORT;  Surgeon: Pedro Earls, MD;  Location: [facility]S;  Service: General;  Laterality: N/A;   PACEMAKER INSERTION   08/04/2013   St. [name] single chamber ICD    Current Outpatient Medications  Medication Sig Dispense Refill   amitriptyline (ELAVIL) 25 MG tablet Take 75 mg by mouth at bedtime.     aspirin 81 MG tablet Take 1 tablet (81 mg total) by mouth every evening.     atorvastatin (LIPITOR) 10 MG tablet Take 1 tablet by mouth once daily 90 tablet 2   carvedilol (COREG) 25 MG tablet TAKE 1 TABLET BY MOUTH TWICE DAILY WITH A MEAL 180 tablet 3   cetirizine (ZYRTEC) 5 MG tablet Take 1 tablet (5 mg total) by mouth daily. 14 tablet 1   folic acid (FOLVITE) 1 MG tablet Take 1 mg by mouth every morning.     furosemide (LASIX) 20 MG tablet TAKE 1 TABLET BY MOUTH IN THE MORNING 90 tablet 3   hydrALAZINE (APRESOLINE) 25 MG tablet TAKE 1 TABLET BY MOUTH THREE TIMES DAILY 270 tablet 2   hydrocerin (EUCERIN) CREA Apply 1 application topically 2 (two) times daily. 454 g 0   HYDROcodone-acetaminophen (NORCO) 7.5-325 MG tablet Take 1 tablet by mouth every 8 (eight) hours as needed for moderate pain or severe pain.   0   isosorbide dinitrate (ISORDIL) 20 MG tablet Take 1 tablet by mouth twice daily 180 tablet 0   losartan (COZAAR) 50 MG tablet Take 1 tablet (50 mg total) by mouth daily. 30 tablet 2   pantoprazole (PROTONIX) 40 MG tablet Take 40 mg by mouth daily.     polyethylene glycol powder (GLYCOLAX/MIRALAX) 17 GM/SCOOP powder 1 Capful(s) By Mouth     potassium chloride SA (KLOR-CON M) 20 MEQ tablet Take 2 tablets (40 mEq total) by mouth daily. 180 tablet 3   triamcinolone ointment (KENALOG) 0.5 % Apply 1 application topically 2 (two) times daily. 30 g 0   Vitamin D, Ergocalciferol, (DRISDOL) 1.25 MG (50000  UNIT) CAPS capsule Take 50,000 Units by mouth once a week.     zolpidem (AMBIEN) 5 MG tablet Take 5 mg by mouth at bedtime as needed.     No current facility-administered medications for this visit.    Allergies:   Iron, Ace inhibitors, Etodolac, and Morphine and related   Social History:  The patient  reports that she has never smoked. She has never used smokeless tobacco. She reports that she does not drink alcohol and does not use drugs.   Family History:  The patient's family history includes Breast cancer in her sister; Colon cancer in her father; Heart disease in her father; Hypertension in her father and paternal grandmother; Kidney disease in her mother; Stroke in her paternal  grandmother.  ROS:  Please see the history of present illness.    All other systems are reviewed and otherwise negative.   PHYSICAL EXAM:  VS:  There were no vitals taken for this visit. BMI: There is no height or weight on file to calculate BMI. Well nourished, well developed, in no acute distress HEENT: normocephalic, atraumatic Neck: no JVD, carotid bruits or masses Cardiac:  *** RRR; no significant murmurs, no rubs, or gallops Lungs:  *** CTA b/l, no wheezing, rhonchi or rales Abd: soft, nontender MS: no deformity or atrophy Ext: *** trace if any edema Skin: warm and dry, no rash Neuro:  No gross deficits appreciated Psych: euthymic mood, full affect  *** ICD site is stable, no tethering or discomfort   EKG:  Not done today   Device interrogation done today and reviewed by myself:  ***   01/08/2018: TTE Study Conclusions  - Left ventricle: LVEF is approximately 50% with apical    hypokinesis. The cavity size was normal. Wall thickness was    increased in a pattern of mild LVH. Systolic function was normal.    The estimated ejection fraction was in the range of 50% to 55%.  - Pulmonary arteries: PA peak pressure: 38 mm Hg (S).    Recent Labs: No results found for requested labs within  last 365 days.  No results found for requested labs within last 365 days.   CrCl cannot be calculated (Patient's most recent lab result is older than the maximum 21 days allowed.).   Wt Readings from Last 3 Encounters:  12/29/21 195 lb (88.5 kg)  01/06/21 199 lb 9.6 oz (90.5 kg)  10/23/20 201 lb 6.4 oz (91.4 kg)     Other studies reviewed: Additional studies/records reviewed today include: summarized above  ASSESSMENT AND PLAN:  1. ICD     *** Intact function, no programming changes made  2. NICM     Recovered LVEF by her last echo     *** No symptoms or exam findings of volume OL     *** CorVue is below threshold with a downwards trend though exam does no support this     *** Weight is down a couple pounds from her last time in     *** On BB, ARB, nitrate, diuretic/K+     C/w Dr. Acie Fredrickson    3. Hx of VT/ VF arrest     *** Not again     *** No arrhythmias noted  4. HTN     *** Looks great  5. Pre-op Risk score is 0.9% ***inginal hernia surgery not felt a high cardiac risk procedure ***     Disposition: ***   Current medicines are reviewed at length with the patient today.  The patient did not have any concerns regarding medicines.  Venetia Night, PA-C 05/21/2022 1:44 PM     [place] [address] [phone] (office)  [phone] (fax)

## 2022-05-25 ENCOUNTER — Encounter: Payer: Self-pay | Admitting: Physician Assistant

## 2022-05-25 ENCOUNTER — Ambulatory Visit: Payer: Medicare HMO | Attending: Physician Assistant | Admitting: Physician Assistant

## 2022-05-25 VITALS — BP 124/80 | HR 75 | Ht 63.0 in | Wt 199.0 lb

## 2022-05-25 DIAGNOSIS — I428 Other cardiomyopathies: Secondary | ICD-10-CM | POA: Diagnosis not present

## 2022-05-25 DIAGNOSIS — I469 Cardiac arrest, cause unspecified: Secondary | ICD-10-CM

## 2022-05-25 DIAGNOSIS — Z79899 Other long term (current) drug therapy: Secondary | ICD-10-CM | POA: Diagnosis not present

## 2022-05-25 DIAGNOSIS — Z95810 Presence of automatic (implantable) cardiac defibrillator: Secondary | ICD-10-CM

## 2022-05-25 DIAGNOSIS — I10 Essential (primary) hypertension: Secondary | ICD-10-CM

## 2022-05-25 DIAGNOSIS — Z01818 Encounter for other preprocedural examination: Secondary | ICD-10-CM

## 2022-05-25 LAB — CUP PACEART INCLINIC DEVICE CHECK
Battery Remaining Longevity: 31 mo
Brady Statistic RV Percent Paced: 0 %
Date Time Interrogation Session: 20230918122632
HighPow Impedance: 67.5 Ohm
Implantable Lead Implant Date: 20141128
Implantable Lead Location: 753860
Implantable Lead Model: 7122
Implantable Pulse Generator Implant Date: 20141128
Lead Channel Impedance Value: 425 Ohm
Lead Channel Pacing Threshold Amplitude: 0.75 V
Lead Channel Pacing Threshold Amplitude: 0.75 V
Lead Channel Pacing Threshold Pulse Width: 0.5 ms
Lead Channel Pacing Threshold Pulse Width: 0.5 ms
Lead Channel Sensing Intrinsic Amplitude: 11.4 mV
Lead Channel Setting Pacing Amplitude: 2.5 V
Lead Channel Setting Pacing Pulse Width: 0.5 ms
Lead Channel Setting Sensing Sensitivity: 0.5 mV
Pulse Gen Serial Number: 7136396

## 2022-05-25 NOTE — Patient Instructions (Signed)
Medication Instructions:   Your physician recommends that you continue on your current medications as directed. Please refer to the Current Medication list given to you today.  *If you need a refill on your cardiac medications before your next appointment, please call your pharmacy*   Lab Work:  BMET TODAY    If you have labs (blood work) drawn today and your tests are completely normal, you will receive your results only by: [place] (if you have MyChart) OR A paper copy in the mail If you have any lab test that is abnormal or we need to change your treatment, we will call you to review the results.   Testing/Procedures: NONE ORDERED  TODAY    Follow-Up: At [facility], you and your health needs are our priority.  As part of our continuing mission to provide you with exceptional heart care, we have created designated Provider Care Teams.  These Care Teams include your primary Cardiologist (physician) and Advanced Practice Providers (APPs -  Physician Assistants and Nurse Practitioners) who all work together to provide you with the care you need, when you need it.  We recommend signing up for the patient portal called "MyChart".  Sign up information is provided on this After Visit Summary.  MyChart is used to connect with patients for Virtual Visits (Telemedicine).  Patients are able to view lab/test results, encounter notes, upcoming appointments, etc.  Non-urgent messages can be sent to your provider as well.   To learn more about what you can do with MyChart, go to [email or web address].    Your next appointment:   1 year(s)  The format for your next appointment:   In Person  Provider:   You may see Cristopher Peru, MD or one of the following Advanced Practice Providers on your designated Care Team:     Other Instructions   Important Information About Sugar

## 2022-05-26 LAB — BASIC METABOLIC PANEL
BUN/Creatinine Ratio: 17 (ref 12–28)
BUN: 22 mg/dL (ref 8–27)
CO2: 24 mmol/L (ref 20–29)
Calcium: 9.1 mg/dL (ref 8.7–10.3)
Chloride: 105 mmol/L (ref 96–106)
Creatinine, Ser: 1.31 mg/dL — ABNORMAL HIGH (ref 0.57–1.00)
Glucose: 91 mg/dL (ref 70–99)
Potassium: 4.6 mmol/L (ref 3.5–5.2)
Sodium: 144 mmol/L (ref 134–144)
eGFR: 44 mL/min/{1.73_m2} — ABNORMAL LOW (ref >59)

## 2022-06-01 NOTE — Progress Notes (Signed)
Remote ICD transmission.   

## 2022-06-04 ENCOUNTER — Encounter: Payer: Self-pay | Admitting: Internal Medicine

## 2022-06-04 NOTE — Patient Instructions (Addendum)
SURGICAL WAITING ROOM VISITATION Patients having surgery or a procedure may have no more than 2 support people in the waiting area - these visitors may rotate.   Children under the age of 60 must have an adult with them who is not the patient. If the patient needs to stay at the hospital during part of their recovery, the visitor guidelines for inpatient rooms apply. Pre-op nurse will coordinate an appropriate time for 1 support person to accompany patient in pre-op.  This support person may not rotate.    Please refer to the [facility] website for the visitor guidelines for Inpatients (after your surgery is over and you are in a regular room).    Your procedure is scheduled on: 06/15/22   Report to [facility] Main Entrance    Report to admitting at 9:45 AM   Call this number if you have problems the morning of surgery [phone]   Do not eat food :After Midnight.   After Midnight you may have the following liquids until 9:00 AM DAY OF SURGERY  Water Non-Citrus Juices (without pulp, NO RED) Carbonated Beverages Black Coffee (NO MILK/CREAM OR CREAMERS, sugar ok)  Clear Tea (NO MILK/CREAM OR CREAMERS, sugar ok) regular and decaf                             Plain Jell-O (NO RED)                                           Fruit ices (not with fruit pulp, NO RED)                                     Popsicles (NO RED)                                                               Sports drinks like Gatorade (NO RED)          If you have questions, please contact your surgeon's office.   FOLLOW BOWEL PREP AND ANY ADDITIONAL PRE OP INSTRUCTIONS YOU RECEIVED FROM YOUR SURGEON'S OFFICE!!!     Oral Hygiene is also important to reduce your risk of infection.                                    Remember - BRUSH YOUR TEETH THE MORNING OF SURGERY WITH YOUR REGULAR TOOTHPASTE   Take these medicines the morning of surgery with A SIP OF WATER: Carvedilol, Hydralazine, Isosorbide                               You may not have any metal on your body including hair pins, jewelry, and body piercing             Do not wear make-up, lotions, powders, perfumes, or deodorant  Do not wear nail polish including gel and S&S, artificial/acrylic nails, or any other type of  covering on natural nails including finger and toenails. If you have artificial nails, gel coating, etc. that needs to be removed by a nail salon please have this removed prior to surgery or surgery may need to be canceled/ delayed if the surgeon/ anesthesia feels like they are unable to be safely monitored.   Do not shave  48 hours prior to surgery.    Do not bring valuables to the hospital. [facility] IS NOT             RESPONSIBLE   FOR VALUABLES.  DO NOT BRING YOUR HOME MEDICATIONS TO THE HOSPITAL. PHARMACY WILL DISPENSE MEDICATIONS LISTED ON YOUR MEDICATION LIST TO YOU DURING YOUR ADMISSION IN THE HOSPITAL!    Patients discharged on the day of surgery will not be allowed to drive home.  Someone NEEDS to stay with you for the first 24 hours after anesthesia.              Please read over the following fact sheets you were given: IF YOU HAVE QUESTIONS ABOUT YOUR PRE-OP INSTRUCTIONS PLEASE CALL [name]   If you received a COVID test during your pre-op visit  it is requested that you wear a mask when out in public, stay away from anyone that may not be feeling well and notify your surgeon if you develop symptoms. If you test positive for Covid or have been in contact with anyone that has tested positive in the last 10 days please notify you surgeon.     [facility] - Preparing for Surgery Before surgery, you can play an important role.  Because skin is not sterile, your skin needs to be as free of germs as possible.  You can reduce the number of germs on your skin by washing with CHG (chlorahexidine gluconate) soap before surgery.  CHG is an antiseptic cleaner which kills germs and bonds with the skin to  continue killing germs even after washing. Please DO NOT use if you have an allergy to CHG or antibacterial soaps.  If your skin becomes reddened/irritated stop using the CHG and inform your nurse when you arrive at Short Stay. Do not shave (including legs and underarms) for at least 48 hours prior to the first CHG shower.  You may shave your face/neck.  Please follow these instructions carefully:  1.  Shower with CHG Soap the night before surgery and the  morning of surgery.  2.  If you choose to wash your hair, wash your hair first as usual with your normal  shampoo.  3.  After you shampoo, rinse your hair and body thoroughly to remove the shampoo.                             4.  Use CHG as you would any other liquid soap.  You can apply chg directly to the skin and wash.  Gently with a scrungie or clean washcloth.  5.  Apply the CHG Soap to your body ONLY FROM THE NECK DOWN.   Do   not use on face/ open                           Wound or open sores. Avoid contact with eyes, ears mouth and   genitals (private parts).                       Wash  face,  Genitals (private parts) with your normal soap.             6.  Wash thoroughly, paying special attention to the area where your    surgery  will be performed.  7.  Thoroughly rinse your body with warm water from the neck down.  8.  DO NOT shower/wash with your normal soap after using and rinsing off the CHG Soap.                9.  Pat yourself dry with a clean towel.            10.  Wear clean pajamas.            11.  Place clean sheets on your bed the night of your first shower and do not  sleep with pets. Day of Surgery : Do not apply any lotions/deodorants the morning of surgery.  Please wear clean clothes to the hospital/surgery center.  FAILURE TO FOLLOW THESE INSTRUCTIONS MAY RESULT IN THE CANCELLATION OF YOUR SURGERY  PATIENT SIGNATURE_________________________________  NURSE  SIGNATURE__________________________________  ________________________________________________________________________

## 2022-06-04 NOTE — Progress Notes (Signed)
[facility] DEVICE PROGRAMMING  Patient Information: Name:  Lori Beard  DOB:  08/07/1951  MRN:  [id number]    Planned Procedure:  right robotic inguinal hernia repair  Surgeon:  Dr. Kae Heller  Date of Procedure:  06/15/22  Cautery will be used.  Position during surgery:  unknown   Device Information:  Clinic EP Physician:  Cristopher Peru, MD   Device Type:  Defibrillator Manufacturer and Phone #:  St. [name]/[employer]: [phone] Pacemaker Dependent?:  No. Date of Last Device Check:  05/25/22 in clinic Normal Device Function?:  Yes.    Electrophysiologist's Recommendations:  Have magnet available. Provide continuous ECG monitoring when magnet is used or reprogramming is to be performed.  Procedure should not interfere with device function.  No device programming or magnet placement needed.  Per Device Clinic Standing Orders, Lori Nickel, RN  1:53 PM 06/04/2022

## 2022-06-04 NOTE — Progress Notes (Addendum)
COVID Vaccine Completed: yes  Date of COVID positive in last 90 days: no  PCP - Donnie Coffin, MA Cardiologist - Cleatrice Burke, MD Electrophysiologist- Cristopher Peru, MD  Cardiac clearance by Tommye Standard 05/25/22 in Epic  Chest x-ray - n/a EKG - 05/25/22 Epic Stress Test - yes lon time ago per pt ECHO - 2019 Cardiac Cath - 2014 Pacemaker/ICD device last checked:05/25/22 Epic Spinal Cord Stimulator: n/a  Bowel Prep - no  Sleep Study - yes CPAP - no  Fasting Blood Sugar - n/a Checks Blood Sugar _____ times a day  Blood Thinner Instructions: Aspirin Instructions: ASA 81, no instructions. Pt will call prescriber Last Dose:  Activity level: Can go up a flight of stairs and perform activities of daily living without stopping and without symptoms of chest pain or shortness of breath.    Anesthesia review: HTN, NICM, CHF,CAD, pacemaker, BP 144/98 pt stated she has not taken her BP meds yet this AM  Patient denies shortness of breath, fever, cough and chest pain at PAT appointment  Patient verbalized understanding of instructions that were given to them at the PAT appointment. Patient was also instructed that they will need to review over the PAT instructions again at home before surgery.

## 2022-06-05 ENCOUNTER — Encounter (HOSPITAL_COMMUNITY)
Admission: RE | Admit: 2022-06-05 | Discharge: 2022-06-05 | Disposition: A | Discharge status: Home / Self Care | Payer: Medicare HMO | Source: Ambulatory Visit | Attending: Surgery | Admitting: Surgery

## 2022-06-05 ENCOUNTER — Encounter (HOSPITAL_COMMUNITY): Payer: Self-pay

## 2022-06-05 VITALS — BP 144/98 | HR 77 | Temp 98.3°F | Resp 12 | Ht 63.0 in | Wt 195.6 lb

## 2022-06-05 DIAGNOSIS — Z01812 Encounter for preprocedural laboratory examination: Secondary | ICD-10-CM | POA: Diagnosis not present

## 2022-06-05 DIAGNOSIS — I10 Essential (primary) hypertension: Secondary | ICD-10-CM

## 2022-06-05 DIAGNOSIS — I2510 Atherosclerotic heart disease of native coronary artery without angina pectoris: Secondary | ICD-10-CM

## 2022-06-05 HISTORY — DX: Presence of cardiac pacemaker: Z95.0

## 2022-06-05 HISTORY — DX: Headache, unspecified: R51.9

## 2022-06-05 LAB — CBC
HCT: 34.1 % — ABNORMAL LOW (ref 36.0–46.0)
Hemoglobin: 10.1 g/dL — ABNORMAL LOW (ref 12.0–15.0)
MCH: 27 pg (ref 26.0–34.0)
MCHC: 29.6 g/dL — ABNORMAL LOW (ref 30.0–36.0)
MCV: 91.2 fL (ref 80.0–100.0)
Platelets: 165 10*3/uL (ref 150–400)
RBC: 3.74 MIL/uL — ABNORMAL LOW (ref 3.87–5.11)
RDW: 14.7 % (ref 11.5–15.5)
WBC: 6.3 10*3/uL (ref 4.0–10.5)
nRBC: 0 % (ref 0.0–0.2)

## 2022-06-08 ENCOUNTER — Ambulatory Visit (INDEPENDENT_AMBULATORY_CARE_PROVIDER_SITE_OTHER): Payer: Medicare HMO

## 2022-06-08 DIAGNOSIS — I5022 Chronic systolic (congestive) heart failure: Secondary | ICD-10-CM

## 2022-06-08 DIAGNOSIS — Z95810 Presence of automatic (implantable) cardiac defibrillator: Secondary | ICD-10-CM | POA: Diagnosis not present

## 2022-06-10 NOTE — Progress Notes (Signed)
EPIC Encounter for ICM Monitoring  Patient Name: Lori Beard is a 71 y.o. female Date: 06/10/2022 Primary Care Physican: Alroy Dust, L.Marlou Sa, MD Primary Cardiologist: Nahser Electrophysiologist: Lovena Le 06/10/2022 Weight: 200 lbs                                                            Spoke with patient and heart failure questions reviewed.  Pt answered and said she is feeling fine.   Coruve thoracic impedance normal but suggesting intermittent days with possible fluid accumulation.   Prescribed: Furosemide 20 mg take 1 tablet daily. Potassium 20 mEq take 2 tablets daily.   Labs: 05/25/2022 Creatinine 1.31, BUN 22, Potassium 4.6, Sodium 144, GFR 44 A complete set of results can be found in Results Review.   Recommendations:  No changes and encouraged to call if experiencing any fluid symptoms.   Follow-up plan: ICM clinic phone appointment on 07/20/2022.   91 day device clinic remote transmission 08/12/2022.     EP/Cardiology Next Visit:    Recall 05/20/2023 with Dr Lovena Le.   Recall 12/24/2022 with APP for Dr Acie Fredrickson.   Copy of ICM check sent to Dr. Lovena Le.   3 month ICM trend: 06/08/2022.    12-14 Month ICM trend:     Rosalene Billings, RN 06/10/2022 1:48 PM

## 2022-06-11 NOTE — Anesthesia Preprocedure Evaluation (Addendum)
Anesthesia Evaluation  Patient identified by MRN, date of birth, ID band Patient awake    Reviewed: Allergy & Precautions, NPO status , Patient's Chart, lab work & pertinent test results, reviewed documented beta blocker date and time   Airway Mallampati: II  TM Distance: >3 FB Neck ROM: Full    Dental  (+) Teeth Intact, Dental Advisory Given   Pulmonary sleep apnea ,    Pulmonary exam normal breath sounds clear to auscultation       Cardiovascular hypertension, Pt. on home beta blockers and Pt. on medications + CAD, + Past MI, + Peripheral Vascular Disease and +CHF  Normal cardiovascular exam+ dysrhythmias Ventricular Tachycardia + pacemaker + Cardiac Defibrillator  Rhythm:Regular Rate:Normal  Echo 01/08/2018  - Left ventricle: LVEF is approximately 50% with apical  hypokinesis. The cavity size was normal. Wall thickness was increased in a pattern of mild LVH. Systolic function was normal. The estimated ejection fraction was in the range of 50% to 55%.  - Pulmonary arteries: PA peak pressure: 38 mm Hg (S).    Neuro/Psych  Headaches,    GI/Hepatic Neg liver ROS, GERD  Medicated,s/p lapband surgery 11/2012 Right inguinal hernia    Endo/Other  Obesity   Renal/GU Renal disease     Musculoskeletal  (+) Arthritis ,   Abdominal   Peds  Hematology  (+) Blood dyscrasia, anemia ,   Anesthesia Other Findings Day of surgery medications reviewed with the patient.  Reproductive/Obstetrics                            Anesthesia Physical Anesthesia Plan  ASA: 4  Anesthesia Plan: General   Post-op Pain Management: Tylenol PO (pre-op)* and Gabapentin PO (pre-op)*   Induction: Intravenous  PONV Risk Score and Plan: 3 and Dexamethasone and Ondansetron  Airway Management Planned: Oral ETT  Additional Equipment:   Intra-op Plan:   Post-operative Plan: Extubation in OR  Informed Consent: I have  reviewed the patients History and Physical, chart, labs and discussed the procedure including the risks, benefits and alternatives for the proposed anesthesia with the patient or authorized representative who has indicated his/her understanding and acceptance.     Dental advisory given  Plan Discussed with: CRNA  Anesthesia Plan Comments: (See PAT note 06/05/2022)       Anesthesia Quick Evaluation

## 2022-06-11 NOTE — Progress Notes (Signed)
Anesthesia Chart Review   Case: [id number] Date/Time: 06/15/22 1145   Procedure: XI ROBOTIC ASSISTED RIGHT INGUINAL HERNIA REPAIR (Right)   Anesthesia type: General   Pre-op diagnosis: inguinal hernia   Location: [facility] ROOM 05 / [facility]   Surgeons: [name], MD       DISCUSSION:71 y.o. never smoker with h/o HTN, sleep apnea, CHF, CAD, pacemaker in place (device orders in progress note 06/04/2022), inguinal hernia scheduled for above procedure 06/15/2022 with Dr. Romana Juniper.   S/p lap band surgery.   Pt last seen by cardiology 05/25/2022. Per OV note, "Risk score is 0.9% inguinal hernia surgery not felt a high cardiac risk procedure DASI score is 39.45, 7.59 METS She has no symptoms to suggest any clinical cardiac changes.  She is an acceptable cardiac risk for the procedure planned Discussed with her Usual per-operative device management, though with below the waist surgery, nothing particular is typically required"  Anticipate pt can proceed with planned procedure barring acute status change.    VS: BP (!) 144/98   Pulse 77   Temp 36.8 C (Oral)   Resp 12   Ht _0  (1.6 m)   Wt 88.7 kg   SpO2 95%   BMI 34.65 kg/m   PROVIDERS: Mitchell, L.Marlou Sa, MD is PCP   Cardiologist:  Dr. Acie Fredrickson LABS: Labs reviewed: Acceptable for surgery. (all labs ordered are listed, but only abnormal results are displayed)  Labs Reviewed  CBC - Abnormal; Notable for the following components:      Result Value   RBC 3.74 (*)    Hemoglobin 10.1 (*)    HCT 34.1 (*)    MCHC 29.6 (*)    All other components within normal limits     IMAGES:   EKG:   CV: Echo 01/08/2018  - Left ventricle: LVEF is approximately 50% with apical    hypokinesis. The cavity size was normal. Wall thickness was    increased in a pattern of mild LVH. Systolic function was normal.    The estimated ejection fraction was in the range of 50% to 55%.  - Pulmonary arteries: PA peak pressure: 38 mm Hg (S).   Past Medical History:  Diagnosis Date   Aneurysm of right iliac artery (HCC)    Arthritis    ALL OVER   Cardiac arrest ([place]) 08/01/2013   Polymorphic VT   Chronic systolic CHF (congestive heart failure) (HCC)    GERD (gastroesophageal reflux disease)    Headache    migraines   Hypertension    Morbid obesity ([place])    a. s/p lapband surgery 11/2012.   Myocardial infarction ([place]) 08/06/2013   NICM (nonischemic cardiomyopathy) ([place])    Echocardiogram (08/01/13): Mild LVH, global HK, EF 40-45%, Gr 1 DD, MAC, mild RVE, mildly reduced RVSF, PASP 31-35.  LHC (08/02/13):  Normal coronary arteries, EF 35%, LVEDP 17.   Pneumonia 08/01/2013   Presence of permanent cardiac pacemaker    Renal failure, acute ([place]) 08/01/2013   S/P implantation of automatic cardioverter/defibrillator (AICD)    07/2013 Lovena Le)   Sleep apnea 02/15/2012   SLEEP STUDY IN EPIC - MILD OSA-CPAP NOT RECOMMENDED-HOME OXYGEN SUGGESTED BECAUSE OF OXYGEN DESATS ON ROOM AIR.   Ventricular tachycardia (HCC)    a. R on T PVC during admission for pneumonia/NSTEMI => monomorphic VT=>VF=>defib; s/p ICD    Past Surgical History:  Procedure Laterality Date   ABDOMINAL HYSTERECTOMY     BREATH TEK H PYLORI  02/15/2012   Procedure: BREATH  TEK H PYLORI;  Surgeon: Pedro Earls, MD;  Location: [facility];  Service: General;  Laterality: N/A;  [place]  08/02/2013   Normal coronary arteries, EF 35%    CHOLECYSTECTOMY     IMPLANTABLE CARDIOVERTER DEFIBRILLATOR IMPLANT N/A 08/04/2013   Procedure: IMPLANTABLE CARDIOVERTER DEFIBRILLATOR IMPLANT;  Surgeon: Evans Lance, MD;  Location: [facility] CATH LAB;  Service: Cardiovascular;  Laterality: N/A;   JOINT REPLACEMENT     BILATERAL TOTAL HIP REPLACEMENTS   KNEE ARTHROSCOPY     LAPAROSCOPIC GASTRIC BANDING     LEFT HEART CATHETERIZATION WITH CORONARY ANGIOGRAM N/A 08/02/2013   Procedure: LEFT HEART CATHETERIZATION WITH CORONARY ANGIOGRAM;  Surgeon: Wellington Hampshire,  MD;  Location: [facility] CATH LAB;  Service: Cardiovascular;  Laterality: N/A;   MESH APPLIED TO LAP PORT N/A 11/22/2012   Procedure: MESH APPLIED TO LAP PORT;  Surgeon: Pedro Earls, MD;  Location: [facility];  Service: General;  Laterality: N/A;   PACEMAKER INSERTION   08/04/2013   St. [name] single chamber ICD    MEDICATIONS:  amitriptyline (ELAVIL) 25 MG tablet   aspirin 81 MG tablet   aspirin-acetaminophen-caffeine (EXCEDRIN MIGRAINE) 250-250-65 MG tablet   atorvastatin (LIPITOR) 10 MG tablet   carvedilol (COREG) 25 MG tablet   cetirizine (ZYRTEC) 5 MG tablet   folic acid (FOLVITE) 1 MG tablet   furosemide (LASIX) 20 MG tablet   hydrALAZINE (APRESOLINE) 25 MG tablet   hydrocerin (EUCERIN) CREA   HYDROcodone-acetaminophen (NORCO) 7.5-325 MG tablet   isosorbide dinitrate (ISORDIL) 20 MG tablet   losartan (COZAAR) 50 MG tablet   pantoprazole (PROTONIX) 40 MG tablet   polyethylene glycol powder (GLYCOLAX/MIRALAX) 17 GM/SCOOP powder   potassium chloride SA (KLOR-CON M) 20 MEQ tablet   triamcinolone ointment (KENALOG) 0.5 %   Vitamin D, Ergocalciferol, (DRISDOL) 1.25 MG (50000 UNIT) CAPS capsule   zolpidem (AMBIEN) 10 MG tablet   No current facility-administered medications for this encounter.    Konrad Felix Ward, PA-C [facility] [phone]

## 2022-06-15 ENCOUNTER — Encounter (HOSPITAL_COMMUNITY)
Admission: RE | Disposition: A | Discharge status: Home / Self Care | Payer: Self-pay | Source: Home / Self Care | Attending: Surgery

## 2022-06-15 ENCOUNTER — Ambulatory Visit (HOSPITAL_COMMUNITY): Payer: Medicare HMO | Admitting: Physician Assistant

## 2022-06-15 ENCOUNTER — Ambulatory Visit (HOSPITAL_COMMUNITY)
Admission: RE | Admit: 2022-06-15 | Discharge: 2022-06-15 | Disposition: A | Discharge status: Home / Self Care | Payer: Medicare HMO | Attending: Surgery | Admitting: Surgery

## 2022-06-15 ENCOUNTER — Other Ambulatory Visit: Payer: Self-pay

## 2022-06-15 ENCOUNTER — Ambulatory Visit (HOSPITAL_BASED_OUTPATIENT_CLINIC_OR_DEPARTMENT_OTHER): Payer: Medicare HMO | Admitting: Anesthesiology

## 2022-06-15 ENCOUNTER — Encounter (HOSPITAL_COMMUNITY): Payer: Self-pay | Admitting: Surgery

## 2022-06-15 DIAGNOSIS — Z9884 Bariatric surgery status: Secondary | ICD-10-CM | POA: Diagnosis not present

## 2022-06-15 DIAGNOSIS — K4030 Unilateral inguinal hernia, with obstruction, without gangrene, not specified as recurrent: Secondary | ICD-10-CM

## 2022-06-15 DIAGNOSIS — M1990 Unspecified osteoarthritis, unspecified site: Secondary | ICD-10-CM | POA: Insufficient documentation

## 2022-06-15 DIAGNOSIS — I739 Peripheral vascular disease, unspecified: Secondary | ICD-10-CM | POA: Insufficient documentation

## 2022-06-15 DIAGNOSIS — K4090 Unilateral inguinal hernia, without obstruction or gangrene, not specified as recurrent: Secondary | ICD-10-CM | POA: Insufficient documentation

## 2022-06-15 DIAGNOSIS — G4730 Sleep apnea, unspecified: Secondary | ICD-10-CM | POA: Diagnosis not present

## 2022-06-15 DIAGNOSIS — K660 Peritoneal adhesions (postprocedural) (postinfection): Secondary | ICD-10-CM | POA: Diagnosis not present

## 2022-06-15 DIAGNOSIS — K219 Gastro-esophageal reflux disease without esophagitis: Secondary | ICD-10-CM | POA: Insufficient documentation

## 2022-06-15 DIAGNOSIS — I110 Hypertensive heart disease with heart failure: Secondary | ICD-10-CM

## 2022-06-15 DIAGNOSIS — I2510 Atherosclerotic heart disease of native coronary artery without angina pectoris: Secondary | ICD-10-CM

## 2022-06-15 DIAGNOSIS — Z6834 Body mass index (BMI) 34.0-34.9, adult: Secondary | ICD-10-CM | POA: Insufficient documentation

## 2022-06-15 DIAGNOSIS — I252 Old myocardial infarction: Secondary | ICD-10-CM | POA: Diagnosis not present

## 2022-06-15 DIAGNOSIS — I10 Essential (primary) hypertension: Secondary | ICD-10-CM

## 2022-06-15 DIAGNOSIS — I5022 Chronic systolic (congestive) heart failure: Secondary | ICD-10-CM

## 2022-06-15 DIAGNOSIS — Z95810 Presence of automatic (implantable) cardiac defibrillator: Secondary | ICD-10-CM | POA: Insufficient documentation

## 2022-06-15 SURGERY — HERNIORRHAPHY, INGUINAL, ROBOT-ASSISTED, LAPAROSCOPIC
Anesthesia: General | Laterality: Right

## 2022-06-15 MED ORDER — LIDOCAINE 2% (20 MG/ML) 5 ML SYRINGE
INTRAMUSCULAR | Status: DC | PRN
  Administered 2022-06-15: 80 mg via INTRAVENOUS

## 2022-06-15 MED ORDER — LACTATED RINGERS IV SOLN: INTRAVENOUS | Status: DC

## 2022-06-15 MED ORDER — DEXAMETHASONE SODIUM PHOSPHATE 10 MG/ML IJ SOLN
INTRAMUSCULAR | Status: DC | PRN
  Administered 2022-06-15: 8 mg via INTRAVENOUS

## 2022-06-15 MED ORDER — PROPOFOL 10 MG/ML IV BOLUS
INTRAVENOUS | Status: AC
  Filled 2022-06-15: qty 20

## 2022-06-15 MED ORDER — SUGAMMADEX SODIUM 200 MG/2ML IV SOLN
INTRAVENOUS | Status: DC | PRN
  Administered 2022-06-15: 200 mg via INTRAVENOUS

## 2022-06-15 MED ORDER — GLYCOPYRROLATE 0.2 MG/ML IJ SOLN
INTRAMUSCULAR | Status: DC | PRN
  Administered 2022-06-15: .2 mg via INTRAVENOUS

## 2022-06-15 MED ORDER — ONDANSETRON HCL 4 MG/2ML IJ SOLN: 4.0000 mg | Freq: Once | INTRAMUSCULAR | Status: DC | PRN

## 2022-06-15 MED ORDER — ROCURONIUM BROMIDE 10 MG/ML (PF) SYRINGE
PREFILLED_SYRINGE | INTRAVENOUS | Status: AC
  Filled 2022-06-15: qty 10

## 2022-06-15 MED ORDER — BUPIVACAINE LIPOSOME 1.3 % IJ SUSP: 20.0000 mL | Freq: Once | INTRAMUSCULAR | Status: DC

## 2022-06-15 MED ORDER — LACTATED RINGERS IV SOLN: INTRAVENOUS | Status: DC | PRN

## 2022-06-15 MED ORDER — BUPIVACAINE LIPOSOME 1.3 % IJ SUSP
INTRAMUSCULAR | Status: AC
  Filled 2022-06-15: qty 20

## 2022-06-15 MED ORDER — CHLORHEXIDINE GLUCONATE 4 % EX LIQD: 60.0000 mL | Freq: Once | CUTANEOUS | Status: DC

## 2022-06-15 MED ORDER — PROPOFOL 10 MG/ML IV BOLUS
INTRAVENOUS | Status: DC | PRN
  Administered 2022-06-15: 100 mg via INTRAVENOUS

## 2022-06-15 MED ORDER — BUPIVACAINE-EPINEPHRINE (PF) 0.25% -1:200000 IJ SOLN
INTRAMUSCULAR | Status: AC
  Filled 2022-06-15: qty 30

## 2022-06-15 MED ORDER — EPHEDRINE SULFATE-NACL 50-0.9 MG/10ML-% IV SOSY
PREFILLED_SYRINGE | INTRAVENOUS | Status: DC | PRN
  Administered 2022-06-15: 10 mg via INTRAVENOUS
  Administered 2022-06-15 (×3): 5 mg via INTRAVENOUS

## 2022-06-15 MED ORDER — ACETAMINOPHEN 500 MG PO TABS
1000.0000 mg | ORAL_TABLET | ORAL | Status: AC
  Administered 2022-06-15: 1000 mg via ORAL
  Filled 2022-06-15: qty 2

## 2022-06-15 MED ORDER — ONDANSETRON HCL 4 MG/2ML IJ SOLN
INTRAMUSCULAR | Status: AC
  Filled 2022-06-15: qty 2

## 2022-06-15 MED ORDER — ORAL CARE MOUTH RINSE: 15.0000 mL | Freq: Once | OROMUCOSAL | Status: AC

## 2022-06-15 MED ORDER — 0.9 % SODIUM CHLORIDE (POUR BTL) OPTIME
TOPICAL | Status: DC | PRN
  Administered 2022-06-15: 1000 mL

## 2022-06-15 MED ORDER — FENTANYL CITRATE (PF) 250 MCG/5ML IJ SOLN
INTRAMUSCULAR | Status: AC
  Filled 2022-06-15: qty 5

## 2022-06-15 MED ORDER — GABAPENTIN 300 MG PO CAPS
300.0000 mg | ORAL_CAPSULE | ORAL | Status: AC
  Administered 2022-06-15: 300 mg via ORAL
  Filled 2022-06-15: qty 1

## 2022-06-15 MED ORDER — PHENYLEPHRINE 80 MCG/ML (10ML) SYRINGE FOR IV PUSH (FOR BLOOD PRESSURE SUPPORT)
PREFILLED_SYRINGE | INTRAVENOUS | Status: AC
  Filled 2022-06-15: qty 10

## 2022-06-15 MED ORDER — BUPIVACAINE-EPINEPHRINE 0.25% -1:200000 IJ SOLN
INTRAMUSCULAR | Status: DC | PRN
  Administered 2022-06-15: 30 mL

## 2022-06-15 MED ORDER — ONDANSETRON HCL 4 MG/2ML IJ SOLN
INTRAMUSCULAR | Status: DC | PRN
  Administered 2022-06-15: 4 mg via INTRAVENOUS

## 2022-06-15 MED ORDER — EPHEDRINE 5 MG/ML INJ
INTRAVENOUS | Status: AC
  Filled 2022-06-15: qty 5

## 2022-06-15 MED ORDER — ARTIFICIAL TEARS OPHTHALMIC OINT
TOPICAL_OINTMENT | OPHTHALMIC | Status: AC
  Filled 2022-06-15: qty 3.5

## 2022-06-15 MED ORDER — FENTANYL CITRATE (PF) 250 MCG/5ML IJ SOLN
INTRAMUSCULAR | Status: DC | PRN
  Administered 2022-06-15 (×2): 50 ug via INTRAVENOUS

## 2022-06-15 MED ORDER — BUPIVACAINE LIPOSOME 1.3 % IJ SUSP
INTRAMUSCULAR | Status: DC | PRN
  Administered 2022-06-15: 20 mL

## 2022-06-15 MED ORDER — DEXAMETHASONE SODIUM PHOSPHATE 10 MG/ML IJ SOLN
INTRAMUSCULAR | Status: AC
  Filled 2022-06-15: qty 1

## 2022-06-15 MED ORDER — CEFAZOLIN SODIUM-DEXTROSE 2-4 GM/100ML-% IV SOLN
2.0000 g | INTRAVENOUS | Status: AC
  Administered 2022-06-15: 2 g via INTRAVENOUS
  Filled 2022-06-15: qty 100

## 2022-06-15 MED ORDER — FENTANYL CITRATE PF 50 MCG/ML IJ SOSY: 25.0000 ug | PREFILLED_SYRINGE | INTRAMUSCULAR | Status: DC | PRN

## 2022-06-15 MED ORDER — ROCURONIUM BROMIDE 10 MG/ML (PF) SYRINGE
PREFILLED_SYRINGE | INTRAVENOUS | Status: DC | PRN
  Administered 2022-06-15: 50 mg via INTRAVENOUS
  Administered 2022-06-15: 10 mg via INTRAVENOUS

## 2022-06-15 MED ORDER — PHENYLEPHRINE 80 MCG/ML (10ML) SYRINGE FOR IV PUSH (FOR BLOOD PRESSURE SUPPORT)
PREFILLED_SYRINGE | INTRAVENOUS | Status: DC | PRN
  Administered 2022-06-15: 80 ug via INTRAVENOUS

## 2022-06-15 MED ORDER — CHLORHEXIDINE GLUCONATE 0.12 % MT SOLN
15.0000 mL | Freq: Once | OROMUCOSAL | Status: AC
  Administered 2022-06-15: 15 mL via OROMUCOSAL

## 2022-06-15 SURGICAL SUPPLY — 50 items
APPLICATOR COTTON TIP 6 STRL (MISCELLANEOUS) ×2 IMPLANT
APPLICATOR COTTON TIP 6IN STRL (MISCELLANEOUS) ×2
BAG COUNTER SPONGE SURGICOUNT (BAG) IMPLANT
BLADE SURG SZ11 CARB STEEL (BLADE) ×1 IMPLANT
CHLORAPREP W/TINT 26 (MISCELLANEOUS) ×1 IMPLANT
COVER SURGICAL LIGHT HANDLE (MISCELLANEOUS) ×1 IMPLANT
COVER TIP SHEARS 8 DVNC (MISCELLANEOUS) ×1 IMPLANT
COVER TIP SHEARS 8MM DA VINCI (MISCELLANEOUS) ×1
DERMABOND ADVANCED .7 DNX12 (GAUZE/BANDAGES/DRESSINGS) IMPLANT
DRAPE ARM DVNC X/XI (DISPOSABLE) ×4 IMPLANT
DRAPE COLUMN DVNC XI (DISPOSABLE) ×1 IMPLANT
DRAPE DA VINCI XI ARM (DISPOSABLE) ×4
DRAPE DA VINCI XI COLUMN (DISPOSABLE) ×1
ELECT REM PT RETURN 15FT ADLT (MISCELLANEOUS) ×1 IMPLANT
GLOVE BIO SURGEON STRL SZ 6 (GLOVE) ×2 IMPLANT
GLOVE INDICATOR 6.5 STRL GRN (GLOVE) ×2 IMPLANT
GLOVE SS BIOGEL STRL SZ 6 (GLOVE) ×1 IMPLANT
GOWN STRL REUS W/ TWL LRG LVL3 (GOWN DISPOSABLE) ×2 IMPLANT
GOWN STRL REUS W/ TWL XL LVL3 (GOWN DISPOSABLE) IMPLANT
GOWN STRL REUS W/TWL LRG LVL3 (GOWN DISPOSABLE) ×2
GOWN STRL REUS W/TWL XL LVL3 (GOWN DISPOSABLE)
IRRIG SUCT STRYKERFLOW 2 WTIP (MISCELLANEOUS)
IRRIGATION SUCT STRKRFLW 2 WTP (MISCELLANEOUS) IMPLANT
KIT BASIN OR (CUSTOM PROCEDURE TRAY) ×1 IMPLANT
KIT TURNOVER KIT A (KITS) IMPLANT
MESH 3DMAX MID 4X6 RT LRG (Mesh General) IMPLANT
NDL INSUFFLATION 14GA 120MM (NEEDLE) ×1 IMPLANT
NEEDLE HYPO 22GX1.5 SAFETY (NEEDLE) ×1 IMPLANT
NEEDLE INSUFFLATION 14GA 120MM (NEEDLE) ×1 IMPLANT
PACK CARDIOVASCULAR III (CUSTOM PROCEDURE TRAY) ×1 IMPLANT
PAD POSITIONING PINK XL (MISCELLANEOUS) ×1 IMPLANT
SEAL CANN UNIV 5-8 DVNC XI (MISCELLANEOUS) ×3 IMPLANT
SEAL XI 5MM-8MM UNIVERSAL (MISCELLANEOUS) ×3
SOL ANTI FOG 6CC (MISCELLANEOUS) ×1 IMPLANT
SOLUTION ANTI FOG 6CC (MISCELLANEOUS) ×1
SOLUTION ELECTROLUBE (MISCELLANEOUS) ×1 IMPLANT
SPIKE FLUID TRANSFER (MISCELLANEOUS) ×1 IMPLANT
SUT MNCRL AB 4-0 PS2 18 (SUTURE) ×1 IMPLANT
SUT PDS AB 3-0 SH 27 (SUTURE) IMPLANT
SUT VIC AB 3-0 SH 27 (SUTURE) ×1
SUT VIC AB 3-0 SH 27XBRD (SUTURE) ×1 IMPLANT
SUT VLOC 180 2-0 6IN GS21 (SUTURE) ×1 IMPLANT
SUT VLOC 3-0 9IN GRN (SUTURE) IMPLANT
SUT VLOC BARB 180 ABS3/0GR12 (SUTURE) ×1
SUTURE VLOC BRB 180 ABS3/0GR12 (SUTURE) IMPLANT
SYR 10ML LL (SYRINGE) ×1 IMPLANT
SYR 20ML LL LF (SYRINGE) ×1 IMPLANT
TOWEL OR 17X26 10 PK STRL BLUE (TOWEL DISPOSABLE) ×1 IMPLANT
TOWEL OR NON WOVEN STRL DISP B (DISPOSABLE) ×1 IMPLANT
TUBING INSUFFLATION 10FT LAP (TUBING) ×1 IMPLANT

## 2022-06-15 NOTE — Discharge Instructions (Signed)
HERNIA REPAIR: POST OP INSTRUCTIONS   EAT Gradually transition to a high fiber diet with a fiber supplement over the next few weeks after discharge.  Start with a pureed / full liquid diet (see below)  WALK Walk an hour a day (cumulative- not all at once).  Control your pain to do that.    CONTROL PAIN Control pain so that you can walk, sleep, tolerate sneezing/coughing, and go up/down stairs.  HAVE A BOWEL MOVEMENT DAILY Keep your bowels regular to avoid problems.  OK to try a laxative to override constipation.  OK to use an antidairrheal to slow down diarrhea.  Call if not better after 2 tries  CALL IF YOU HAVE PROBLEMS/CONCERNS Call if you are still struggling despite following these instructions. Call if you have concerns not answered by these instructions  ######################################################################    DIET: Follow a light bland diet & liquids the first 24 hours after arrival home, such as soup, liquids, starches, etc.  Be sure to drink plenty of fluids.  Quickly advance to a usual solid diet within a few days.  Avoid fast food or heavy meals as your are more likely to get nauseated or have irregular bowels.  A low-sugar, high-fiber diet for the rest of your life is ideal.   Take your usually prescribed home medications unless otherwise directed.  PAIN CONTROL: Pain is best controlled by a usual combination of three different methods TOGETHER: Ice/Heat Over the counter pain medication Prescription pain medication Most patients will experience some swelling and bruising around the hernia(s) such as the bellybutton, groins, or old incisions.  Ice packs or heating pads (30-60 minutes up to 6 times a day) will help. Use ice for the first few days to help decrease swelling and bruising, then switch to heat to help relax tight/sore spots and speed recovery.  Some people prefer to use ice alone, heat alone, alternating between ice & heat.  Experiment to what  works for you.  Swelling and bruising can take several weeks to resolve.   It is helpful to take an over-the-counter pain medication regularly for the first days: Naproxen (Aleve, etc)  Two 220mg tabs twice a day OR Ibuprofen (Advil, etc) Three 200mg tabs four times a day (every meal & bedtime) AND Acetaminophen (Tylenol, etc) 325-650mg four times a day (every meal & bedtime) A  prescription for pain medication should be given to you upon discharge.  Take your pain medication as prescribed, IF NEEDED.  If you are having problems/concerns with the prescription medicine (does not control pain, nausea, vomiting, rash, itching, etc), please call us [phone] to see if we need to switch you to a different pain medicine that will work better for you and/or control your side effect better. If you need a refill on your pain medication, please contact your pharmacy.  They will contact our office to request authorization. Prescriptions will not be filled after 5 pm or on week-ends.  Avoid getting constipated.  Between the surgery and the pain medications, it is common to experience some constipation.  Increasing fluid intake and taking a fiber supplement (such as Metamucil, Citrucel, FiberCon, MiraLax, etc) 1-2 times a day regularly will usually help prevent this problem from occurring.  A mild laxative (prune juice, Milk of Magnesia, MiraLax, etc) should be taken according to package directions if there are no bowel movements after 48 hours.    Wash / shower every day, starting 2 days after surgery.  You may shower over the   steri strips or skin glue which are waterproof.  No rubbing, scrubbing, lotions or ointments to incision(s). Do not soak or submerge.   Remove your outer bandage 2 days after surgery. Steri strips (if present) will peel off after 1-2 weeks. Glue (if present) will flake off after about 2 weeks.  You may leave the incision open to air.  You may replace a dressing/Band-Aid to cover an  incision for comfort if you wish.  Continue to shower over incision(s) after the dressing is off.  ACTIVITIES as tolerated:   You may resume regular (light) daily activities beginning the next day--such as daily self-care, walking, climbing stairs--gradually increasing activities as tolerated.  Control your pain so that you can walk an hour a day.  If you can walk 30 minutes without difficulty, it is safe to try more intense activity such as jogging, treadmill, bicycling, low-impact aerobics, swimming, etc. Refrain from the most intensive and strenuous activity such as sit-ups, heavy lifting, contact sports, etc  Refrain from any heavy lifting or straining until 6 weeks after surgery.   DO NOT PUSH THROUGH PAIN.  Let pain be your guide: If it hurts to do something, don't do it.  Pain is your body warning you to avoid that activity for another week until the pain goes down. You may drive when you are no longer taking prescription pain medication, you can comfortably wear a seatbelt, and you can safely maneuver your car and apply brakes. You may have sexual intercourse when it is comfortable.   FOLLOW UP in our office Please call [facility] at [phone] to set up an appointment to see your surgeon in the office for a follow-up appointment approximately 2-3 weeks after your surgery. Make sure that you call for this appointment the day you arrive home to insure a convenient appointment time.  9.  If you have disability of FMLA / Family leave forms, please bring the forms to the office for processing.  (do not give to your surgeon).  WHEN TO CALL US [phone]: Poor pain control Reactions / problems with new medications (rash/itching, nausea, etc)  Fever over 101.5 F (38.5 C) Inability to urinate Nausea and/or vomiting Worsening swelling or bruising Continued bleeding from incision. Increased pain, redness, or drainage from the incision   The clinic staff is available to answer your questions  during regular business hours (8:30am-5pm).  Please don't hesitate to call and ask to speak to one of our nurses for clinical concerns.   If you have a medical emergency, go to the nearest emergency room or call 911.  A surgeon from [facility] is always on call at the hospitals in [place]  [facility] [address], [address], [place], [place]  [address] ?  [address] [address], , [place]   [address] MAIN: [phone] ? TOLL FREE: [phone] ? FAX: [phone] [email or web address]  

## 2022-06-15 NOTE — Transfer of Care (Signed)
Immediate Anesthesia Transfer of Care Note  Patient: Lori Beard  Procedure(s) Performed: XI ROBOTIC ASSISTED RIGHT INGUINAL HERNIA REPAIR (Right)  Patient Location: PACU  Anesthesia Type:General  Level of Consciousness: oriented, drowsy and patient cooperative  Airway & Oxygen Therapy: Patient Spontanous Breathing and Patient connected to face mask oxygen  Post-op Assessment: Report given to RN and Post -op Vital signs reviewed and stable  Post vital signs: Reviewed  Last Vitals:  Vitals Value Taken Time  BP 105/70 06/15/22 1436  Temp    Pulse 72 06/15/22 1439  Resp 12 06/15/22 1439  SpO2 97 % 06/15/22 1439  Vitals shown include unvalidated device data.  Last Pain:  Vitals:   06/15/22 1031  TempSrc: Oral  PainSc:          Complications: No notable events documented.

## 2022-06-15 NOTE — Op Note (Signed)
Operative Note  Ineisha Moffa  [id number]  DB:[id number]  06/15/2022   Surgeon: Romana Juniper MD   Procedure performed: Robotic right inguinal hernia repair (transabdominal preperitoneal)   Preop diagnosis: Right inguinal hernia Post-op diagnosis/intraop findings: Indirect right inguinal hernia containing incarcerated preperitoneal fat; adhesions of the omentum to the upper midline and across the abdominal wall   Specimens: no Retained items: no  EBL: Minimal cc Complications: none   Description of procedure: After obtaining informed consent the patient was taken to the operating room and placed supine on operating room table where general endotracheal anesthesia was initiated, preoperative antibiotics were administered, SCDs applied, foley inserted (removed at the end of the case) and a formal timeout was performed. The abdomen was prepped and draped in usual sterile fashion. Patient placed in slight reverse Trendelenberg. Peritoneal access was gained with a left subcostal Veress needle and insufflation to 15 mmHg ensued without incident.  An 8 mm robotic trocar was placed supraumbilical into the left of midline, several centimeters from her Lap-Band port and a camera was inserted.  She has omental adhesions to the upper abdominal wall and so the camera and trocar were actually underneath the omentum with a view of the posterior wall of the transverse colon and underlying small bowel.  All of this was inspected and confirmed to be free of injury.  We were able to make a window in the omental adhesions to the upper abdomen.  Bilateral laparoscopic tap blocks were performed with Exparel mixed with quarter percent Marcaine with epinephrine and bilateral 8 mm trocars were placed under direct visualization.  The area of Veress entry was inspected and there was no evidence of bleeding nor injury to the viscera in the left upper quadrant although the colon is somewhat dilated which is her baseline based  on preoperative CT scan.  This area was inspected again at the end of the case and confirmed no evidence of injury. The patient was then placed in steep Trendelenburg and the pelvis inspected.  She has no hernia on the left but there is a indirect hernia on the right.  The robot was then docked and instruments inserted under direct visualization.   The peritoneal flap was developed from the anterior superior iliac spine to the medial umbilical ligament and then careful blunt and cautery dissection were used to further dissect the peritoneum from the anterior abdominal wall.  The space of Retzius was developed and Cooper's ligament and the pubic symphysis exposed as well as about 2 cm inferior to this, the dissection proceeded inferiorly with good exposure of the iliac vessels and myopectineal orifice and proceeded laterally until sufficient space for the mesh had been created.  The hernia sac was reduced completely and the round ligament was divided after cauterizing with the bipolar.  She also had a large volume of incarcerated preperitoneal fat from the medial umbilical ligament which was reduced.  Hemostasis in the field was excellent.  A Bard 3D max mid weight large mesh was inserted with excellent overlap of the hernia defect as well as the indirect and femoral spaces.  This was secured with simple interrupted 3-0 Vicryl's to the Cooper's ligament as well as superiorly on either side of the inferior epigastric vessels.  The peritoneum was then brought back up to cover the mesh, ensuring that the mesh remained flush and did not buckle or fold while doing so.  The peritoneum was closed with running imbricating 3-0 V lock, at the medial aspect of the  large pedunculated medial umbilical ligament that had been reduced was tacked back up to the abdominal wall as part of the closure.  On completion there was no exposed mesh and the mesh remained flush beneath the peritoneum.  The abdomen was then inspected and again,  as above the left upper quadrant was inspected and there was no evidence of injury from our entry.  The abdomen was then desufflated after removing the robotic instruments under direct visualization, and the robot was undocked.  The trocars were removed and the skin incisions were closed with subcuticular Monocryl and Dermabond. The patient was then awakened, extubated and taken to PACU in stable condition.  All counts were correct at the completion of the case.

## 2022-06-15 NOTE — Anesthesia Postprocedure Evaluation (Signed)
Anesthesia Post Note  Patient: Lori Beard  Procedure(s) Performed: XI ROBOTIC ASSISTED RIGHT INGUINAL HERNIA REPAIR (Right)     Patient location during evaluation: PACU Anesthesia Type: General Level of consciousness: awake and alert Pain management: pain level controlled Vital Signs Assessment: post-procedure vital signs reviewed and stable Respiratory status: spontaneous breathing, nonlabored ventilation, respiratory function stable and patient connected to nasal cannula oxygen Cardiovascular status: blood pressure returned to baseline and stable Postop Assessment: no apparent nausea or vomiting Anesthetic complications: no   No notable events documented.  Last Vitals:  Vitals:   06/15/22 1620 06/15/22 1730  BP: (!) 167/95 (!) 152/94  Pulse: 62 76  Resp: 16   Temp: 36.7 C   SpO2: 95% 96%    Last Pain:  Vitals:   06/15/22 1620  TempSrc:   PainSc: 0-No pain                 Santa Lighter

## 2022-06-15 NOTE — H&P (Signed)
Lori Beard [id number]   Referring Provider:  Hammer, Eli, MD   Subjective   Chief Complaint: No chief complaint on file.     History of Present Illness:    Very pleasant 71-year-old woman with history of peripheral vascular disease, right iliac artery aneurysm, history of chronic systolic heart failure following NSTEMI with cardiac arrest in 2014, ICD in place with history of ventricular arrhythmias in the setting of nonischemic cardiomyopathy, hypertension, sleep apnea, morbid obesity status post lap band, who presents for evaluation of a right inguinal hernia.  She noticed this first about a year ago.  At that time it was not bothering her and she was advised to let it be.  Recently however she has had more discomfort in the area, frequently she will feel "bubbling" in the area, and has had a few episodes of sharp severe pain in the right groin related to this.  Denies any obstructive symptoms or change in bowel habits.   Review of abdominal surgery includes lap band placement, hysterectomy, cholecystectomy.   Review of Systems: A complete review of systems was obtained from the patient.  I have reviewed this information and discussed as appropriate with the patient.  See HPI as well for other ROS.   Medical History: Past Medical History:  Diagnosis Date   Arthritis     There is no problem list on file for this patient.   History reviewed. No pertinent surgical history.   Allergies  Allergen Reactions   Iron Other (See Comments)    Gives patient migraine headaches when supplemented   Morphine Itching and Rash    Current Outpatient Medications on File Prior to Visit  Medication Sig Dispense Refill   amitriptyline (ELAVIL) 25 MG tablet TAKE 3 TABLETS BY MOUTH ONCE DAILY AT BEDTIME     atorvastatin (LIPITOR) 10 MG tablet Take 10 mg by mouth once daily     carvediloL (COREG) 25 MG tablet TAKE 1 TABLET BY MOUTH TWICE DAILY WITH A MEAL     ergocalciferol, vitamin D2,  1,250 mcg (50,000 unit) capsule Take 50,000 Units by mouth every 7 (seven) days     FUROsemide (LASIX) 20 MG tablet Take 1 tablet by mouth every morning     hydrALAZINE (APRESOLINE) 25 MG tablet Take 25 mg by mouth 3 (three) times daily     HYDROcodone-acetaminophen (NORCO) 7.5-325 mg tablet      losartan (COZAAR) 50 MG tablet Take 50 mg by mouth once daily     potassium chloride (KLOR-CON) 20 MEQ ER tablet Take by mouth     zolpidem (AMBIEN) 5 MG tablet Take 5 mg by mouth at bedtime as needed for Sleep     No current facility-administered medications on file prior to visit.    History reviewed. No pertinent family history.   Social History   Tobacco Use  Smoking Status Never  Smokeless Tobacco Not on file     Social History   Socioeconomic History   Marital status: Married  Tobacco Use   Smoking status: Never  Substance and Sexual Activity   Alcohol use: Not Currently   Drug use: Never    Objective:    There were no vitals filed for this visit.  There is no height or weight on file to calculate BMI.  Alert and well-appearing Unlabored respirations Abdomen soft and nontender, obese.  There is a partially reducible, nontender right inguinal hernia.  CT from 04/23/2022 reviewed personally by me demonstrates a right inguinal hernia containing   Alcohol use: Not Currently   Drug use: Never      Objective:      There were no vitals filed for this visit.  There is no height or weight on file to calculate BMI.   Alert and well-appearing Unlabored respirations Abdomen soft and nontender, obese.  There is a partially reducible, nontender right inguinal hernia.   CT from 04/23/2022 reviewed personally by me demonstrates a right inguinal hernia containing nonobstructed small bowel.  Large stool burden.   Assessment and Plan:  Diagnoses and all orders for this visit:   Non-recurrent unilateral inguinal hernia without obstruction or gangrene   Increasingly symptomatic and contained small bowel on most recent CT.  I recommend proceeding with a robotic repair.  We discussed the surgery in detail including relevant anatomy and the technique of repair.  Discussed the use of mesh.  We discussed the risks of bleeding, infection, pain, scarring, injury to intra-abdominal or retroperitoneal structures, chronic pain,  hernia recurrence, as well as risk of cardiovascular complications such as arrhythmia or MI, pneumonia, stroke, blood clot etc.  Questions welcomed and answered to her satisfaction.  She wishes to proceed with surgery.  We will request clearance from Dr. Acie Fredrickson and schedule at the patient's convenience.  I also went over signs and symptoms that she should be alert to should she develop obstruction or strangulation, that should prompt her to seek emergency treatment between now and the time of her elective repair.   Ashdon Gillson Raquel James, MD

## 2022-06-15 NOTE — Anesthesia Procedure Notes (Signed)
Procedure Name: Intubation Date/Time: 06/15/2022 12:35 PM  Performed by: Jenne Campus, CRNAPre-anesthesia Checklist: Patient identified, Emergency Drugs available, Suction available and Patient being monitored Patient Re-evaluated:Patient Re-evaluated prior to induction Oxygen Delivery Method: Circle System Utilized Preoxygenation: Pre-oxygenation with 100% oxygen Induction Type: IV induction Ventilation: Mask ventilation without difficulty Laryngoscope Size: Miller and 3 Grade View: Grade I Tube type: Oral Tube size: 7.0 mm Number of attempts: 1 Airway Equipment and Method: Stylet and Oral airway Placement Confirmation: ETT inserted through vocal cords under direct vision, positive ETCO2 and breath sounds checked- equal and bilateral Secured at: 22 cm Tube secured with: Tape Dental Injury: Teeth and Oropharynx as per pre-operative assessment

## 2022-07-20 ENCOUNTER — Ambulatory Visit (INDEPENDENT_AMBULATORY_CARE_PROVIDER_SITE_OTHER): Payer: Medicare HMO

## 2022-07-20 DIAGNOSIS — Z95810 Presence of automatic (implantable) cardiac defibrillator: Secondary | ICD-10-CM

## 2022-07-20 DIAGNOSIS — I5022 Chronic systolic (congestive) heart failure: Secondary | ICD-10-CM | POA: Diagnosis not present

## 2022-07-24 ENCOUNTER — Telehealth: Payer: Self-pay

## 2022-07-24 NOTE — Progress Notes (Signed)
EPIC Encounter for ICM Monitoring  Patient Name: Lori Beard is a 71 y.o. female Date: 07/24/2022 Primary Care Physican: [name], L.August Saucer, MD Primary Cardiologist: Nahser Electrophysiologist: [name] 06/10/2022 Weight: 200 lbs                                                            Attempted call to patient and unable to reach.  Left detailed message per DPR regarding transmission. Transmission reviewed.    Coruve thoracic impedance normal but suggesting intermittent days with possible fluid accumulation.   Prescribed: Furosemide 20 mg take 1 tablet daily. Potassium 20 mEq take 2 tablets daily.   Labs: 05/25/2022 Creatinine 1.31, BUN 22, Potassium 4.6, Sodium 144, GFR 44 A complete set of results can be found in Results Review.   Recommendations:  Left voice mail with ICM number and encouraged to call if experiencing any fluid symptoms.   Follow-up plan: ICM clinic phone appointment on 08/24/2022.   91 day device clinic remote transmission 08/12/2022.     EP/Cardiology Next Visit:    Recall 05/20/2023 with Dr [name].   Recall 12/24/2022 with APP for Dr Elease Hashimoto.   Copy of ICM check sent to Dr. [name].    3 month ICM trend: 07/20/2022.    12-14 Month ICM trend:     Karie Soda, RN 07/24/2022 9:55 AM

## 2022-07-24 NOTE — Telephone Encounter (Signed)
Remote ICM transmission received.  Attempted call to patient regarding ICM remote transmission and left detailed message per DPR.  Advised to return call for any fluid symptoms or questions. Next ICM remote transmission scheduled 08/24/2022.    

## 2022-08-10 ENCOUNTER — Telehealth: Payer: Self-pay | Admitting: Cardiovascular Disease

## 2022-08-10 NOTE — Telephone Encounter (Signed)
Patient stated she will need a letter from the cardiologist stating that he has a defibrillator so she will not have to go through scanners.  Patient stated the letter can be addressed to "To Whom It May Concern" and she will pick up the letter when it is ready.

## 2022-08-11 NOTE — Telephone Encounter (Signed)
Patient is calling back to check on the status of her letter.

## 2022-08-11 NOTE — Telephone Encounter (Signed)
Letter created and left up front for pick up.  Pt aware.

## 2022-08-12 ENCOUNTER — Ambulatory Visit (INDEPENDENT_AMBULATORY_CARE_PROVIDER_SITE_OTHER): Payer: Medicare HMO

## 2022-08-12 DIAGNOSIS — I469 Cardiac arrest, cause unspecified: Secondary | ICD-10-CM | POA: Diagnosis not present

## 2022-08-13 LAB — CUP PACEART REMOTE DEVICE CHECK
Battery Remaining Longevity: 29 mo
Battery Remaining Percentage: 28 %
Battery Voltage: 2.83 V
Brady Statistic RV Percent Paced: 1 %
Date Time Interrogation Session: 20231206020015
HighPow Impedance: 69 Ohm
HighPow Impedance: 69 Ohm
Implantable Lead Connection Status: 753985
Implantable Lead Implant Date: 20141128
Implantable Lead Location: 753860
Implantable Lead Model: 7122
Implantable Pulse Generator Implant Date: 20141128
Lead Channel Impedance Value: 430 Ohm
Lead Channel Pacing Threshold Amplitude: 0.75 V
Lead Channel Pacing Threshold Pulse Width: 0.5 ms
Lead Channel Sensing Intrinsic Amplitude: 9.7 mV
Lead Channel Setting Pacing Amplitude: 2.5 V
Lead Channel Setting Pacing Pulse Width: 0.5 ms
Lead Channel Setting Sensing Sensitivity: 0.5 mV
Pulse Gen Serial Number: 7136396

## 2022-08-24 ENCOUNTER — Ambulatory Visit (INDEPENDENT_AMBULATORY_CARE_PROVIDER_SITE_OTHER): Payer: Medicare HMO

## 2022-08-24 DIAGNOSIS — Z95810 Presence of automatic (implantable) cardiac defibrillator: Secondary | ICD-10-CM | POA: Diagnosis not present

## 2022-08-24 DIAGNOSIS — I5022 Chronic systolic (congestive) heart failure: Secondary | ICD-10-CM

## 2022-08-27 ENCOUNTER — Telehealth: Payer: Self-pay

## 2022-08-27 NOTE — Telephone Encounter (Signed)
Remote ICM transmission received.  Attempted call to patient regarding ICM remote transmission and no answer.  

## 2022-08-27 NOTE — Progress Notes (Signed)
EPIC Encounter for ICM Monitoring  Patient Name: Lori Beard is a 71 y.o. female Date: 08/27/2022 Primary Care Physican: [name], L.August Saucer, MD Primary Cardiologist: Nahser Electrophysiologist: [name] 06/10/2022 Weight: 200 lbs                                                            Attempted call to patient and unable to reach.   Transmission reviewed.    Coruve thoracic impedance normal but suggesting intermittent days with possible fluid accumulation.   Prescribed: Furosemide 20 mg take 1 tablet daily. Potassium 20 mEq take 2 tablets daily.   Labs: 05/25/2022 Creatinine 1.31, BUN 22, Potassium 4.6, Sodium 144, GFR 44 A complete set of results can be found in Results Review.   Recommendations: Unable to reach.     Follow-up plan: ICM clinic phone appointment on 09/28/2022.   91 day device clinic remote transmission 11/11/2022.     EP/Cardiology Next Visit:    Recall 05/20/2023 with Dr [name].   Recall 12/24/2022 with APP for Dr Elease Hashimoto.   Copy of ICM check sent to Dr. [name].    3 month ICM trend: 08/24/2022.    12-14 Month ICM trend:     Karie Soda, RN 08/27/2022 8:43 AM

## 2022-09-04 NOTE — Progress Notes (Signed)
Remote ICD transmission.   

## 2022-09-25 ENCOUNTER — Other Ambulatory Visit: Payer: Self-pay | Admitting: Cardiovascular Disease

## 2022-09-25 DIAGNOSIS — I2510 Atherosclerotic heart disease of native coronary artery without angina pectoris: Secondary | ICD-10-CM

## 2022-09-28 ENCOUNTER — Ambulatory Visit (INDEPENDENT_AMBULATORY_CARE_PROVIDER_SITE_OTHER): Payer: Medicare HMO

## 2022-09-28 DIAGNOSIS — Z95810 Presence of automatic (implantable) cardiac defibrillator: Secondary | ICD-10-CM

## 2022-09-28 DIAGNOSIS — I5022 Chronic systolic (congestive) heart failure: Secondary | ICD-10-CM

## 2022-09-30 NOTE — Progress Notes (Signed)
EPIC Encounter for ICM Monitoring  Patient Name: Lori Beard is a 72 y.o. female Date: 09/30/2022 Primary Care Physican: Alroy Dust, L.Marlou Sa, MD Primary Cardiologist: Nahser Electrophysiologist: Lovena Le 06/10/2022 Weight: 200 lbs                                                            Transmission reviewed.    Coruve thoracic impedance suggesting normal fluid levels.   Prescribed: Furosemide 20 mg take 1 tablet daily. Potassium 20 mEq take 2 tablets daily.   Labs: 05/25/2022 Creatinine 1.31, BUN 22, Potassium 4.6, Sodium 144, GFR 44 A complete set of results can be found in Results Review.   Recommendations: No changes.   Follow-up plan: ICM clinic phone appointment on 11/02/2022.   91 day device clinic remote transmission 11/11/2022.     EP/Cardiology Next Visit:    Recall 05/20/2023 with Dr Lovena Le.   Recall 12/24/2022 with APP for Dr Acie Fredrickson.   Copy of ICM check sent to Dr. Lovena Le.    3 month ICM trend: 09/28/2022.    12-14 Month ICM trend:     Rosalene Billings, RN 09/30/2022 12:47 PM

## 2022-11-02 ENCOUNTER — Ambulatory Visit: Payer: Medicare HMO | Attending: Internal Medicine

## 2022-11-02 DIAGNOSIS — I5022 Chronic systolic (congestive) heart failure: Secondary | ICD-10-CM

## 2022-11-02 DIAGNOSIS — Z95810 Presence of automatic (implantable) cardiac defibrillator: Secondary | ICD-10-CM

## 2022-11-06 NOTE — Progress Notes (Signed)
EPIC Encounter for ICM Monitoring  Patient Name: Lori Beard is a 72 y.o. female Date: 11/06/2022 Primary Care Physican: Alroy Dust, L.Marlou Sa, MD Primary Cardiologist: Nahser Electrophysiologist: Lovena Le 11/06/2022 Weight: 200 lbs                                                            Spoke with patient and heart failure questions reviewed.  Transmission results reviewed.  Pt asymptomatic for fluid accumulation.  Reports feeling well at this time and voices no complaints.     Coruve thoracic impedance suggesting normal fluid levels with the exception of possible fluid accumulation from 1/27-2/7.   Prescribed: Furosemide 20 mg take 1 tablet daily. Potassium 20 mEq take 2 tablets daily.   Labs: 05/25/2022 Creatinine 1.31, BUN 22, Potassium 4.6, Sodium 144, GFR 44 A complete set of results can be found in Results Review.   Recommendations:  No changes and encouraged to call if experiencing any fluid symptoms.   Follow-up plan: ICM clinic phone appointment on 12/07/2022.   91 day device clinic remote transmission 02/10/2023.     EP/Cardiology Next Visit:    Recall 05/20/2023 with Dr Lovena Le.   Recall 12/24/2022 with APP for Dr Acie Fredrickson.   Copy of ICM check sent to Dr. Lovena Le.    3 month ICM trend: 11/02/2022.    12-14 Month ICM trend:     Rosalene Billings, RN 11/06/2022 2:40 PM

## 2022-11-11 ENCOUNTER — Ambulatory Visit (INDEPENDENT_AMBULATORY_CARE_PROVIDER_SITE_OTHER): Payer: Medicare HMO

## 2022-11-11 DIAGNOSIS — I428 Other cardiomyopathies: Secondary | ICD-10-CM

## 2022-11-16 LAB — CUP PACEART REMOTE DEVICE CHECK
Battery Remaining Longevity: 25 mo
Battery Remaining Percentage: 24 %
Battery Voltage: 2.8 V
Brady Statistic RV Percent Paced: 1 %
Date Time Interrogation Session: 20240306062944
HighPow Impedance: 63 Ohm
HighPow Impedance: 63 Ohm
Implantable Lead Connection Status: 753985
Implantable Lead Implant Date: 20141128
Implantable Lead Location: 753860
Implantable Lead Model: 7122
Implantable Pulse Generator Implant Date: 20141128
Lead Channel Impedance Value: 400 Ohm
Lead Channel Pacing Threshold Amplitude: 0.75 V
Lead Channel Pacing Threshold Pulse Width: 0.5 ms
Lead Channel Sensing Intrinsic Amplitude: 9.3 mV
Lead Channel Setting Pacing Amplitude: 2.5 V
Lead Channel Setting Pacing Pulse Width: 0.5 ms
Lead Channel Setting Sensing Sensitivity: 0.5 mV
Pulse Gen Serial Number: 7136396

## 2022-12-02 ENCOUNTER — Other Ambulatory Visit (HOSPITAL_BASED_OUTPATIENT_CLINIC_OR_DEPARTMENT_OTHER): Payer: Self-pay

## 2022-12-02 DIAGNOSIS — R5383 Other fatigue: Secondary | ICD-10-CM

## 2022-12-02 DIAGNOSIS — G4733 Obstructive sleep apnea (adult) (pediatric): Secondary | ICD-10-CM

## 2022-12-02 DIAGNOSIS — R0683 Snoring: Secondary | ICD-10-CM

## 2022-12-07 ENCOUNTER — Ambulatory Visit: Payer: Medicare HMO | Attending: Internal Medicine

## 2022-12-07 DIAGNOSIS — I5022 Chronic systolic (congestive) heart failure: Secondary | ICD-10-CM | POA: Diagnosis not present

## 2022-12-07 DIAGNOSIS — Z95810 Presence of automatic (implantable) cardiac defibrillator: Secondary | ICD-10-CM | POA: Diagnosis not present

## 2022-12-11 NOTE — Progress Notes (Signed)
EPIC Encounter for ICM Monitoring  Patient Name: Lori Beard is a 72 y.o. female Date: 12/11/2022 Primary Care Physican: [name], L.August Saucer, MD Primary Cardiologist: Nahser Electrophysiologist: [name] 11/06/2022 Weight: 200 lbs                                                            Attempted call to patient and unable to reach.  Left detailed message per DPR regarding transmission. Transmission reviewed.      Coruve thoracic impedance suggesting normal fluid levels with the exception of possible fluid accumulation from 3/26-3/29.   Prescribed: Furosemide 20 mg take 1 tablet daily. Potassium 20 mEq take 2 tablets daily.   Labs: 05/25/2022 Creatinine 1.31, BUN 22, Potassium 4.6, Sodium 144, GFR 44 A complete set of results can be found in Results Review.   Recommendations:  Left voice mail with ICM number and encouraged to call if experiencing any fluid symptoms.   Follow-up plan: ICM clinic phone appointment on 01/11/2023.   91 day device clinic remote transmission 02/10/2023.     EP/Cardiology Next Visit:    Recall 05/20/2023 with Dr [name].   Recall 12/24/2022 with APP for Dr Elease Hashimoto.   Copy of ICM check sent to Dr. [name].    3 month ICM trend: 12/07/2022.    12-14 Month ICM trend:     Karie Soda, RN 12/11/2022 2:06 PM

## 2022-12-15 ENCOUNTER — Other Ambulatory Visit (HOSPITAL_BASED_OUTPATIENT_CLINIC_OR_DEPARTMENT_OTHER): Payer: Self-pay

## 2022-12-15 DIAGNOSIS — R5383 Other fatigue: Secondary | ICD-10-CM

## 2022-12-15 DIAGNOSIS — R0683 Snoring: Secondary | ICD-10-CM

## 2022-12-16 NOTE — Progress Notes (Signed)
Remote ICD transmission.   

## 2022-12-28 ENCOUNTER — Ambulatory Visit (HOSPITAL_BASED_OUTPATIENT_CLINIC_OR_DEPARTMENT_OTHER): Payer: Medicare HMO | Admitting: Internal Medicine

## 2023-01-04 ENCOUNTER — Encounter (HOSPITAL_BASED_OUTPATIENT_CLINIC_OR_DEPARTMENT_OTHER): Payer: Medicare HMO | Admitting: Internal Medicine

## 2023-01-08 ENCOUNTER — Ambulatory Visit (HOSPITAL_BASED_OUTPATIENT_CLINIC_OR_DEPARTMENT_OTHER): Payer: Medicare HMO | Admitting: Internal Medicine

## 2023-01-08 DIAGNOSIS — R0683 Snoring: Secondary | ICD-10-CM

## 2023-01-08 DIAGNOSIS — R5383 Other fatigue: Secondary | ICD-10-CM

## 2023-01-11 ENCOUNTER — Ambulatory Visit: Payer: Medicare HMO | Attending: Internal Medicine

## 2023-01-11 DIAGNOSIS — Z95810 Presence of automatic (implantable) cardiac defibrillator: Secondary | ICD-10-CM | POA: Diagnosis not present

## 2023-01-11 DIAGNOSIS — I5022 Chronic systolic (congestive) heart failure: Secondary | ICD-10-CM | POA: Diagnosis not present

## 2023-01-11 NOTE — Progress Notes (Signed)
EPIC Encounter for ICM Monitoring  Patient Name: Lori Beard is a 72 y.o. female Date: 01/11/2023 Primary Care Physican: [name], L.August Saucer, MD Primary Cardiologist: Nahser Electrophysiologist: [name] 11/06/2022 Weight: 200 lbs                                                            Transmission reviewed.      Coruve thoracic impedance suggesting intermittent days with possible fluid accumulation.   Prescribed: Furosemide 20 mg take 1 tablet daily. Potassium 20 mEq take 2 tablets daily.   Labs: 05/25/2022 Creatinine 1.31, BUN 22, Potassium 4.6, Sodium 144, GFR 44 A complete set of results can be found in Results Review.   Recommendations:  No changes.   Follow-up plan: ICM clinic phone appointment on 02/22/2023.   91 day device clinic remote transmission 02/10/2023.     EP/Cardiology Next Visit:    Recall 05/20/2023 with Dr [name].   Recall 12/24/2022 with APP for Dr Elease Hashimoto.   Copy of ICM check sent to Dr. [name].     3 month ICM trend: 01/11/2023.    12-14 Month ICM trend:     Karie Soda, RN 01/11/2023 9:34 AM

## 2023-01-18 ENCOUNTER — Encounter (HOSPITAL_BASED_OUTPATIENT_CLINIC_OR_DEPARTMENT_OTHER): Payer: Medicare HMO | Admitting: Internal Medicine

## 2023-01-24 ENCOUNTER — Other Ambulatory Visit: Payer: Self-pay | Admitting: Cardiovascular Disease

## 2023-02-10 ENCOUNTER — Ambulatory Visit (INDEPENDENT_AMBULATORY_CARE_PROVIDER_SITE_OTHER): Payer: Medicare HMO

## 2023-02-10 DIAGNOSIS — I428 Other cardiomyopathies: Secondary | ICD-10-CM | POA: Diagnosis not present

## 2023-02-10 LAB — CUP PACEART REMOTE DEVICE CHECK
Battery Remaining Longevity: 23 mo
Battery Remaining Percentage: 22 %
Battery Voltage: 2.78 V
Brady Statistic RV Percent Paced: 1 %
Date Time Interrogation Session: 20240605020016
HighPow Impedance: 68 Ohm
HighPow Impedance: 68 Ohm
Implantable Lead Connection Status: 753985
Implantable Lead Implant Date: 20141128
Implantable Lead Location: 753860
Implantable Lead Model: 7122
Implantable Pulse Generator Implant Date: 20141128
Lead Channel Impedance Value: 430 Ohm
Lead Channel Pacing Threshold Amplitude: 0.75 V
Lead Channel Pacing Threshold Pulse Width: 0.5 ms
Lead Channel Sensing Intrinsic Amplitude: 9.5 mV
Lead Channel Setting Pacing Amplitude: 2.5 V
Lead Channel Setting Pacing Pulse Width: 0.5 ms
Lead Channel Setting Sensing Sensitivity: 0.5 mV
Pulse Gen Serial Number: 7136396

## 2023-02-22 ENCOUNTER — Ambulatory Visit: Payer: Medicare HMO | Attending: Internal Medicine

## 2023-02-22 DIAGNOSIS — Z95810 Presence of automatic (implantable) cardiac defibrillator: Secondary | ICD-10-CM

## 2023-02-22 DIAGNOSIS — I5022 Chronic systolic (congestive) heart failure: Secondary | ICD-10-CM

## 2023-02-24 ENCOUNTER — Telehealth: Payer: Self-pay

## 2023-02-24 NOTE — Telephone Encounter (Signed)
Remote ICM transmission received.  Attempted call to patient regarding ICM remote transmission and left detailed message per DPR.  Left ICM phone number and advised to return call for any fluid symptoms or questions. Next ICM remote transmission scheduled 03/29/2023.    

## 2023-02-24 NOTE — Progress Notes (Signed)
EPIC Encounter for ICM Monitoring  Patient Name: Lori Beard is a 72 y.o. female Date: 02/24/2023 Primary Care Physican: [name], L.August Saucer, MD Primary Cardiologist: Nahser Electrophysiologist: [name] 11/06/2022 Weight: 200 lbs                                                            Attempted call to patient and unable to reach.  Left detailed message per DPR regarding transmission. Transmission reviewed.      Coruve thoracic impedance suggesting possible fluid accumulation starting 6/15 but returned to baseline 6/18.   Prescribed: Furosemide 20 mg take 1 tablet daily. Potassium 20 mEq take 2 tablets daily.   Labs: 05/25/2022 Creatinine 1.31, BUN 22, Potassium 4.6, Sodium 144, GFR 44 A complete set of results can be found in Results Review.   Recommendations:  Left voice mail with ICM number and encouraged to call if experiencing any fluid symptoms.   Follow-up plan: ICM clinic phone appointment on 03/29/2023.   91 day device clinic remote transmission 05/12/2023.     EP/Cardiology Next Visit:    Recall 05/20/2023 with Dr [name].   Recall 12/24/2022 with APP for Dr Elease Hashimoto.   Copy of ICM check sent to Dr. [name].     3 month ICM trend: 02/22/2023.    12-14 Month ICM trend:     Karie Soda, RN 02/24/2023 12:56 PM

## 2023-03-08 NOTE — Progress Notes (Signed)
Remote ICD transmission.   

## 2023-03-24 ENCOUNTER — Other Ambulatory Visit: Payer: Self-pay | Admitting: Cardiovascular Disease

## 2023-03-29 ENCOUNTER — Ambulatory Visit: Payer: Medicare HMO

## 2023-03-29 ENCOUNTER — Telehealth: Payer: Self-pay

## 2023-03-29 DIAGNOSIS — Z95810 Presence of automatic (implantable) cardiac defibrillator: Secondary | ICD-10-CM | POA: Diagnosis not present

## 2023-03-29 DIAGNOSIS — I5022 Chronic systolic (congestive) heart failure: Secondary | ICD-10-CM | POA: Diagnosis not present

## 2023-03-29 NOTE — Progress Notes (Signed)
Spoke with patient and heart failure questions reviewed.  Transmission results reviewed.  Pt swelling in legs and feet but does resolve overnight.    DIET: She has been eating restaurant foods in the last week. She is not restrictive with salt intake.  Advised restaurants foods are very high in salt and try to cut back on salt intake.    Weight is stable at 200 lbs.  Copy sent to Dr Elease Hashimoto for review.  Advised to call the office to make appointment that was due in April.

## 2023-03-29 NOTE — Telephone Encounter (Signed)
Remote ICM transmission received.  Attempted call to patient regarding ICM remote transmission and left detailed message per DPR.  Left ICM phone number and advised to return call for any fluid symptoms or questions. Next ICM remote transmission scheduled 04/05/2023.    

## 2023-03-29 NOTE — Progress Notes (Signed)
EPIC Encounter for ICM Monitoring  Patient Name: Lori Beard is a 72 y.o. female Date: 03/29/2023 Primary Care Physican: [name], L.August Saucer, MD Primary Cardiologist: Nahser Electrophysiologist: [name] 11/06/2022 Weight: 200 lbs                                                            Attempted call to patient and unable to reach.  Left detailed message per DPR regarding transmission. Transmission reviewed.      Coruve thoracic impedance suggesting possible fluid accumulation starting 7/17 and returning close to baseline.   Prescribed: Furosemide 20 mg take 1 tablet daily. Potassium 20 mEq take 2 tablets daily.   Labs: 05/25/2022 Creatinine 1.31, BUN 22, Potassium 4.6, Sodium 144, GFR 44 A complete set of results can be found in Results Review.   Recommendations:  Left voice mail with ICM number and encouraged to call if experiencing any fluid symptoms.   Follow-up plan: ICM clinic phone appointment on 04/05/2023 to recheck fluid levels.   91 day device clinic remote transmission 05/12/2023.     EP/Cardiology Next Visit:    Recall 05/20/2023 with Dr [name].   Recall 12/24/2022 with APP for Dr Elease Hashimoto.   Copy of ICM check sent to Dr. [name].   Will send copy to Dr Elease Hashimoto for review if patient is reached.   3 month ICM trend: 03/29/2023.    12-14 Month ICM trend:     Karie Soda, RN 03/29/2023 2:38 PM

## 2023-04-02 ENCOUNTER — Ambulatory Visit (HOSPITAL_BASED_OUTPATIENT_CLINIC_OR_DEPARTMENT_OTHER): Payer: Medicare HMO | Admitting: Family

## 2023-04-02 ENCOUNTER — Encounter (HOSPITAL_BASED_OUTPATIENT_CLINIC_OR_DEPARTMENT_OTHER): Payer: Self-pay | Admitting: Family

## 2023-04-02 VITALS — BP 134/90 | HR 74 | Ht 63.0 in | Wt 199.3 lb

## 2023-04-02 DIAGNOSIS — Z79899 Other long term (current) drug therapy: Secondary | ICD-10-CM

## 2023-04-02 DIAGNOSIS — Z95810 Presence of automatic (implantable) cardiac defibrillator: Secondary | ICD-10-CM | POA: Diagnosis not present

## 2023-04-02 DIAGNOSIS — I5022 Chronic systolic (congestive) heart failure: Secondary | ICD-10-CM | POA: Diagnosis not present

## 2023-04-02 DIAGNOSIS — I428 Other cardiomyopathies: Secondary | ICD-10-CM | POA: Diagnosis not present

## 2023-04-02 NOTE — Patient Instructions (Addendum)
Medication Instructions:  Continue your current medications.   Depending on your lab results we may adjust your fluid pill and/or add a medicine called Marcelline Deist that helps prevent you from holding onto fluid.  *If you need a refill on your cardiac medications before your next appointment, please call your pharmacy*   Lab Work: Your physician recommends that you return for lab work today: BMP, BNP, CBC  If you have labs (blood work) drawn today and your tests are completely normal, you will receive your results only by: MyChart Message (if you have MyChart) OR A paper copy in the mail If you have any lab test that is abnormal or we need to change your treatment, we will call you to review the results.   Testing/Procedures: Your EKG today showed sinus rhythm which is a good result!  Follow-Up: At [facility], you and your health needs are our priority.  As part of our continuing mission to provide you with exceptional heart care, we have created designated Provider Care Teams.  These Care Teams include your primary Cardiologist (physician) and Advanced Practice Providers (APPs -  Physician Assistants and Nurse Practitioners) who all work together to provide you with the care you need, when you need it.  We recommend signing up for the patient portal called "MyChart".  Sign up information is provided on this After Visit Summary.  MyChart is used to connect with patients for Virtual Visits (Telemedicine).  Patients are able to view lab/test results, encounter notes, upcoming appointments, etc.  Non-urgent messages can be sent to your provider as well.   To learn more about what you can do with MyChart, go to [email or web address].    Your next appointment:   3-4 month(s)  Provider:   Kristeen Miss, MD  or Alver Sorrow, NP   Other Instructions  Heart Healthy Diet Recommendations: A low-salt diet is recommended. Meats should be grilled, baked, or boiled. Avoid fried foods.  Focus on lean protein sources like fish or chicken with vegetables and fruits. The American Heart Association is a Chief Technology Officer!  American Heart Association Diet and Lifeystyle Recommendations   Recommend drinking less than 2L (64 oz) of fluid

## 2023-04-02 NOTE — Progress Notes (Signed)
Cardiology Office Note:  .   Date:  04/02/2023  ID:  Lori Beard, DOB [birth year], MRN [id number] PCP: Asencion Gowda.August Saucer, MD  [facility] Providers Cardiologist:  Kristeen Miss, MD Electrophysiologist:  [name], MD    History of Present Illness: .   Lori Beard is a 72 y.o. female with history of hypertension, obesity (s/p lap band), GERD, NICM s/p ICD (08/04/13 SJM single chamber ICD), chronic systolic heart failure.  She denies a diagnosis of OSA.  Admitted 07/2013 with pneumonia ruled in for NSTEMI.  Hospitalization complicated by monomorphic V. tach progressed to V-fib in setting of hypokalemia K2.9.  Echo mild LVH, global hypokinesis, EF 40 to 45%, gr1dd, MAC, mildly reduced RVSF. LHC 07/2013 normal coronary arteries. Underwent ICD implantation.   Most recent echo 01/08/2018 LVEF 50-55%, mild LVH, PASP 38 mmHg, no significant valvular abnormalities.  She had ICM call 03/27/2023 with thoracic impedance suggesting possible fluid accumulation starting 7/17 and returning close to baseline.  On phone call with nursing team she noted swelling by end of day but resolved by morning.  She also noted more restaurant foods and was encouraged to reduce sodium intake.  Presents today for follow-up independently. Pleasant lady who works part time at the [employer]. She has been taking her fluid pill twice per day for the last week. Notes this has helped her swelling.She reports her LLE always swells but improves with elevation and extra fluid pill. Reports no shortness of breath nor dyspnea on exertion. Reports no chest pain, pressure, or tightness. No  orthopnea, PND. Reports no palpitations.  She attributes her BP to missing her morning medicine today. BP at home checked intermittently 110/70s. She thinks she is adhering to <2L. She has cut back on salt.   ROS: Please see the history of present illness.    All other systems reviewed and are negative.   Studies Reviewed: [name]   EKG  Interpretation Date/Time:  Friday April 02 2023 11:05:06 EDT Ventricular Rate:  73 PR Interval:  190 QRS Duration:  108 QT Interval:  376 QTC Calculation: 414 R Axis:   257  Text Interpretation: Sinus rhythm with Premature atrial complexes No acute ST/T wave changes. Confirmed by Gillian Shields ([id number]) on 04/02/2023 11:19:33 AM    Cardiac Studies & Procedures       ECHOCARDIOGRAM  ECHOCARDIOGRAM COMPLETE 01/08/2018  Narrative *[place]* *[facility]* [address] [place], [place] [address] [phone]  ------------------------------------------------------------------- Transthoracic Echocardiography  Patient:    Lori Beard MR #:       [id number] Study Date: 01/08/2018 Gender:     F Age:        83 Height:     160 cm Weight:     93.1 kg BSA:        2.08 m^2 Pt. Status: Room:       3E28C  ADMITTING    Lucky Rathke REFERRING    Lahoma Crocker PERFORMING   [facility], Inpatient ATTENDING    [place], DeBary SONOGRAPHER  Sinda Du, RDCS  cc:  ------------------------------------------------------------------- LV EF: 50% -   55%  ------------------------------------------------------------------- Indications:      Syncope 780.2.  ------------------------------------------------------------------- History:   PMH:   Congestive heart failure.  PMH:   Myocardial infarction.  Sudden death episode.  Risk factors:  Hypertension.  ------------------------------------------------------------------- Study Conclusions  - Left ventricle: LVEF is approximately 50% with apical hypokinesis. The cavity size was  normal. Wall thickness was increased in a pattern of mild LVH. Systolic function was normal. The estimated ejection fraction was in the range of 50% to 55%. - Pulmonary arteries: PA peak pressure: 38 mm Hg (S).  ------------------------------------------------------------------- Study data:  Comparison was  made to the study of 08/01/2013.  Study status:  Routine.  Procedure:  Transthoracic echocardiography. Image quality was adequate.  Study completion:  There were no complications.          Transthoracic echocardiography.  M-mode, complete 2D, spectral Doppler, and color Doppler.  Birthdate: Patient birthdate: Dec 17, 1950.  Age:  Patient is 72 yr old.  Sex: Gender: female.    BMI: 36.4 kg/m^2.  Blood pressure:     106/70 Patient status:  Inpatient.  Study date:  Study date: 01/08/2018. Study time: 02:06 PM.  Location:  Bedside.  -------------------------------------------------------------------  ------------------------------------------------------------------- Left ventricle:  LVEF is approximately 50% with apical hypokinesis. The cavity size was normal. Wall thickness was increased in a pattern of mild LVH. Systolic function was normal. The estimated ejection fraction was in the range of 50% to 55%.  ------------------------------------------------------------------- Aortic valve:   Structurally normal valve.   Cusp separation was normal.  Doppler:  Transvalvular velocity was within the normal range. There was no stenosis. There was no regurgitation.  ------------------------------------------------------------------- Mitral valve:   Structurally normal valve.   Leaflet separation was normal.  Doppler:  Transvalvular velocity was within the normal range. There was no evidence for stenosis. There was no regurgitation.    Valve area by pressure half-time: 3.1 cm^2. Indexed valve area by pressure half-time: 1.49 cm^2/m^2.    Peak gradient (D): 4 mm Hg.  ------------------------------------------------------------------- Left atrium:  The atrium was normal in size.  ------------------------------------------------------------------- Right ventricle:  The cavity size was normal. Wall thickness was normal. Pacer wire or catheter noted in right ventricle. Systolic function was  normal.  ------------------------------------------------------------------- Pulmonic valve:    Structurally normal valve.   Cusp separation was normal.  Doppler:  Transvalvular velocity was within the normal range. There was no regurgitation.  ------------------------------------------------------------------- Tricuspid valve:   Structurally normal valve.   Leaflet separation was normal.  Doppler:  Transvalvular velocity was within the normal range. There was mild regurgitation.  ------------------------------------------------------------------- Right atrium:  The atrium was normal in size.  ------------------------------------------------------------------- Pericardium:  There was no pericardial effusion.  ------------------------------------------------------------------- Systemic veins: Inferior vena cava: The vessel was dilated. The respirophasic diameter changes were blunted (< 50%), consistent with elevated central venous pressure.  ------------------------------------------------------------------- Measurements  Left ventricle                         Value          Reference LV ID, ED, PLAX chordal                46    mm       43 - 52 LV ID, ES, PLAX chordal                29    mm       23 - 38 LV fx shortening, PLAX chordal         37    %        >=29 LV PW thickness, ED                    12    mm       ---------- IVS/LV PW ratio, ED  1.17           <=1.3 Stroke volume, 2D                      90    ml       ---------- Stroke volume/bsa, 2D                  43    ml/m^2   ---------- LV e&', lateral                         12    cm/s     ---------- LV E/e&', lateral                       8.28           ---------- LV e&', medial                          8.92  cm/s     ---------- LV E/e&', medial                        11.14          ---------- LV e&', average                         10.46 cm/s     ---------- LV E/e&', average                        9.5            ----------  Ventricular septum                     Value          Reference IVS thickness, ED                      14    mm       ----------  LVOT                                   Value          Reference LVOT ID, S                             21    mm       ---------- LVOT area                              3.46  cm^2     ---------- LVOT peak velocity, S                  124   cm/s     ---------- LVOT mean velocity, S                  75.3  cm/s     ---------- LVOT VTI, S                            25.9  cm       ---------- LVOT peak gradient, S  6     mm Hg    ----------  Aorta                                  Value          Reference Aortic root ID, ED                     34    mm       ----------  Left atrium                            Value          Reference LA ID, A-P, ES                         30    mm       ---------- LA ID/bsa, A-P                         1.44  cm/m^2   <=2.2 LA volume, ES, 1-p A4C                 61.3  ml       ---------- LA volume/bsa, ES, 1-p A4C             29.5  ml/m^2   ---------- LA volume, ES, 1-p A2C                 68.8  ml       ---------- LA volume/bsa, ES, 1-p A2C             33.1  ml/m^2   ----------  Mitral valve                           Value          Reference Mitral E-wave peak velocity            99.4  cm/s     ---------- Mitral A-wave peak velocity            92.2  cm/s     ---------- Mitral deceleration time       (H)     243   ms       150 - 230 Mitral pressure half-time              71    ms       ---------- Mitral peak gradient, D                4     mm Hg    ---------- Mitral E/A ratio, peak                 1.1            ---------- Mitral valve area, PHT, DP             3.1   cm^2     ---------- Mitral valve area/bsa, PHT, DP         1.49  cm^2/m^2 ----------  Pulmonary arteries                     Value          Reference PA pressure, S, DP             (  H)     38    mm Hg    <=30  Tricuspid  valve                        Value          Reference Tricuspid regurg peak velocity         274   cm/s     ---------- Tricuspid peak RV-RA gradient          30    mm Hg    ----------  Right atrium                           Value          Reference RA ID, S-I, ES, A4C            (H)     50.8  mm       34 - 49 RA area, ES, A4C               (H)     21.1  cm^2     8.3 - 19.5 RA volume, ES, A/L                     69.2  ml       ---------- RA volume/bsa, ES, A/L                 33.3  ml/m^2   ----------  Systemic veins                         Value          Reference Estimated CVP                          8     mm Hg    ----------  Right ventricle                        Value          Reference TAPSE                                  12    mm       ---------- RV pressure, S, DP             (H)     38    mm Hg    <=30 RV s&', lateral, S                      10.7  cm/s     ----------  Legend: (L)  and  (H)  mark values outside specified reference range.  ------------------------------------------------------------------- Prepared and Electronically Authenticated by  Dietrich Pates, M.D. 2019-05-04T16:41:31             Risk Assessment/Calculations:     HYPERTENSION CONTROL Vitals:   04/02/23 1100 04/02/23 1119  BP: 136/84 (!) 134/90    The patient's blood pressure is elevated above target today.  In order to address the patient's elevated BP: Labs and/or other diagnostics are currently pending prior to making blood pressure medication adjustments.; Follow up with general cardiology has been recommended.          Physical Exam:  VS:  BP (!) 134/90   Pulse 74   Ht 5\' 3"  (1.6 m)   Wt 199 lb 4.8 oz (90.4 kg)   SpO2 95%   BMI 35.30 kg/m    Wt Readings from Last 3 Encounters:  04/02/23 199 lb 4.8 oz (90.4 kg)  06/15/22 195 lb 8.8 oz (88.7 kg)  06/05/22 195 lb 9.6 oz (88.7 kg)    GEN: Well nourished, overweight,  well developed in no acute distress NECK: No JVD; No carotid  bruits CARDIAC: RRR, no murmurs, rubs, gallops RESPIRATORY:  Clear to auscultation without rales, wheezing or rhonchi  ABDOMEN: Soft, non-tender, non-distended EXTREMITIES:  No edema; No deformity   ASSESSMENT AND PLAN: .    NICM / HF with recovered LVEF - She has self increased her Lasix to 20mg  BID. Update CBC, BNP, BMP. Continue Lasix BID until lab results. If stable renal function, plan to start SGLT2 and reduce Lasix dose. Continue Isordil, Losartan, Coreg. No MRA due due to prior hyperkalemia.  Volume status persistently difficult to control consider updating echocardiogram.  Hx of VT/VF arrest / ICD in situ - Follows with EP team.   HTN - BP mildly elevated in clinic which she attributes to not taking her Lasix this morning.  She will monitor at home and check in via phone call in 2 weeks.    Dispo: follow up in 3 to 4 months with general cardiology team  Signed, Alver Sorrow, NP

## 2023-04-05 ENCOUNTER — Telehealth (HOSPITAL_BASED_OUTPATIENT_CLINIC_OR_DEPARTMENT_OTHER): Payer: Self-pay

## 2023-04-05 ENCOUNTER — Ambulatory Visit: Payer: Medicare HMO | Attending: Internal Medicine

## 2023-04-05 DIAGNOSIS — I5022 Chronic systolic (congestive) heart failure: Secondary | ICD-10-CM

## 2023-04-05 DIAGNOSIS — Z95810 Presence of automatic (implantable) cardiac defibrillator: Secondary | ICD-10-CM

## 2023-04-05 DIAGNOSIS — I10 Essential (primary) hypertension: Secondary | ICD-10-CM

## 2023-04-05 DIAGNOSIS — Z79899 Other long term (current) drug therapy: Secondary | ICD-10-CM

## 2023-04-05 NOTE — Telephone Encounter (Addendum)
Left message for patient to call back    ----- Message from Alver Sorrow sent at 04/04/2023  8:24 PM EDT ----- Stable kidney function. Normal electrolytes. CBC with improved anemia. BNP with no volume overload.   Recommend reduce lasix from BID to daily and add Farxiga 10mg  daily. Repeat BMP in 2 weeks for monitoring.

## 2023-04-06 NOTE — Telephone Encounter (Signed)
Left message for patient to call back  

## 2023-04-07 MED ORDER — DAPAGLIFLOZIN PROPANEDIOL 10 MG PO TABS: 10.0000 mg | ORAL_TABLET | Freq: Every day | ORAL | 5 refills | Status: AC

## 2023-04-07 NOTE — Telephone Encounter (Signed)
Patient returned RN's call. 

## 2023-04-07 NOTE — Telephone Encounter (Signed)
Advised patient, verbalized understanding Rx sent to [facility] and mailed lab orders

## 2023-04-07 NOTE — Addendum Note (Signed)
Addended by: Regis Bill B on: 04/07/2023 12:47 PM   Modules accepted: Orders

## 2023-04-07 NOTE — Progress Notes (Signed)
EPIC Encounter for ICM Monitoring  Patient Name: Lori Beard is a 72 y.o. female Date: 04/07/2023 Primary Care Physican: [name], L.August Saucer, MD Primary Cardiologist: Nahser Electrophysiologist: [name] 11/06/2022 Weight: 200 lbs 04/07/2023 Weight: 199 lbs                                                             Spoke with patient and heart failure questions reviewed.  Transmission results reviewed.  Pt asymptomatic for fluid accumulation.  Reports feeling well at this time and voices no complaints.     Coruve thoracic impedance suggesting fluid levels returned to normal.   Prescribed: Furosemide 20 mg take 1 tablet daily. Potassium 20 mEq take 2 tablets daily.   Labs: 04/02/2023 Creatinine 1.33, BUN 13, Potassium 4.3, Sodium 141, GFR 43 A complete set of results can be found in Results Review.   Recommendations:  No changes and encouraged to call if experiencing any fluid symptoms.  Advised Caitlyn Walker's office trying to reach her about lab results and medication changes and to call the office today.     Follow-up plan: ICM clinic phone appointment on 05/03/2023.   91 day device clinic remote transmission 05/12/2023.     EP/Cardiology Next Visit:    Recall 05/20/2023 with Dr [name].   07/06/2023 with Dr Elease Hashimoto.   Copy of ICM check sent to Dr. [name].  3 month ICM trend: 04/05/2023.    12-14 Month ICM trend:     Karie Soda, RN 04/07/2023 9:18 AM

## 2023-05-03 ENCOUNTER — Ambulatory Visit: Payer: Medicare HMO | Attending: Internal Medicine

## 2023-05-03 DIAGNOSIS — I5022 Chronic systolic (congestive) heart failure: Secondary | ICD-10-CM

## 2023-05-03 DIAGNOSIS — Z95810 Presence of automatic (implantable) cardiac defibrillator: Secondary | ICD-10-CM

## 2023-05-04 ENCOUNTER — Other Ambulatory Visit: Payer: Self-pay | Admitting: Cardiovascular Disease

## 2023-05-04 NOTE — Progress Notes (Signed)
EPIC Encounter for ICM Monitoring  Patient Name: Lori Beard is a 72 y.o. female Date: 05/04/2023 Primary Care Physican: [name], L.August Saucer, MD Primary Cardiologist: Nahser Electrophysiologist: [name] 11/06/2022 Weight: 200 lbs 04/07/2023 Weight: 199 lbs                                                  Spoke with patient and heart failure questions reviewed.  Transmission results reviewed.  Pt asymptomatic for fluid accumulation.  Reports feeling well at this time and voices no complaints.     Coruve thoracic impedance suggesting possible fluid accumulation starting 8/25.   Prescribed: Furosemide 20 mg take 1 tablet daily. Potassium 20 mEq take 2 tablets daily.   Labs: 04/02/2023 Creatinine 1.33, BUN 13, Potassium 4.3, Sodium 141, GFR 43 A complete set of results can be found in Results Review.   Recommendations:  No changes and encouraged to call if experiencing any fluid symptoms.       Follow-up plan: ICM clinic phone appointment on 05/11/2023 to recheck fluid levels.   91 day device clinic remote transmission 05/12/2023.     EP/Cardiology Next Visit:   07/12/2023 with Dr [name].   07/06/2023 with Dr Elease Hashimoto.   Copy of ICM check sent to Dr. [name].  3 month ICM trend: 05/03/2023.    12-14 Month ICM trend:     Karie Soda, RN 05/04/2023 4:07 PM

## 2023-05-11 ENCOUNTER — Ambulatory Visit: Payer: Medicare HMO | Attending: Internal Medicine

## 2023-05-11 DIAGNOSIS — I5022 Chronic systolic (congestive) heart failure: Secondary | ICD-10-CM

## 2023-05-11 DIAGNOSIS — Z95810 Presence of automatic (implantable) cardiac defibrillator: Secondary | ICD-10-CM

## 2023-05-11 LAB — CUP PACEART REMOTE DEVICE CHECK
Battery Remaining Longevity: 20 mo
Battery Remaining Percentage: 19 %
Battery Voltage: 2.77 V
Brady Statistic RV Percent Paced: 1 %
Date Time Interrogation Session: 20240903040018
HighPow Impedance: 73 Ohm
HighPow Impedance: 73 Ohm
Implantable Lead Connection Status: 753985
Implantable Lead Implant Date: 20141128
Implantable Lead Location: 753860
Implantable Lead Model: 7122
Implantable Pulse Generator Implant Date: 20141128
Lead Channel Impedance Value: 390 Ohm
Lead Channel Pacing Threshold Amplitude: 0.75 V
Lead Channel Pacing Threshold Pulse Width: 0.5 ms
Lead Channel Sensing Intrinsic Amplitude: 9 mV
Lead Channel Setting Pacing Amplitude: 2.5 V
Lead Channel Setting Pacing Pulse Width: 0.5 ms
Lead Channel Setting Sensing Sensitivity: 0.5 mV
Pulse Gen Serial Number: 7136396

## 2023-05-12 ENCOUNTER — Ambulatory Visit (INDEPENDENT_AMBULATORY_CARE_PROVIDER_SITE_OTHER): Payer: Self-pay

## 2023-05-12 DIAGNOSIS — I428 Other cardiomyopathies: Secondary | ICD-10-CM | POA: Diagnosis not present

## 2023-05-13 ENCOUNTER — Telehealth: Payer: Self-pay | Admitting: Cardiovascular Disease

## 2023-05-13 NOTE — Telephone Encounter (Signed)
Patient has dropped off a "verification of chronic condition" form for [employer].  I am placing this form in Dr. Harvie Bridge box.  Thank you.

## 2023-05-14 ENCOUNTER — Telehealth: Payer: Self-pay

## 2023-05-14 NOTE — Telephone Encounter (Signed)
Remote ICM transmission received.  Attempted call to patient regarding ICM remote transmission and no answer.  

## 2023-05-14 NOTE — Progress Notes (Signed)
EPIC Encounter for ICM Monitoring  Patient Name: Lori Beard is a 72 y.o. female Date: 05/14/2023 Primary Care Physican: [name], L.August Saucer, MD Primary Cardiologist: Nahser Electrophysiologist: [name] 11/06/2022 Weight: 200 lbs 04/07/2023 Weight: 199 lbs                                                  Attempted call to patient and unable to reach.  Transmission reviewed.     Coruve thoracic impedance suggesting fluid levels returned to baseline.   Prescribed: Furosemide 20 mg take 1 tablet daily. Potassium 20 mEq take 2 tablets daily.   Labs: 04/02/2023 Creatinine 1.33, BUN 13, Potassium 4.3, Sodium 141, GFR 43 A complete set of results can be found in Results Review.   Recommendations:  Unable to reach.       Follow-up plan: ICM clinic phone appointment on 06/14/2023.   91 day device clinic remote transmission 08/11/2023.     EP/Cardiology Next Visit:   07/12/2023 with Dr [name].   07/06/2023 with Dr Elease Hashimoto.   Copy of ICM check sent to Dr. [name].  3 month ICM trend: 05/11/2023.    12-14 Month ICM trend:     Karie Soda, RN 05/14/2023 8:12 AM

## 2023-05-17 NOTE — Telephone Encounter (Signed)
Form retrieved from box, signed, faxed to [facility], and confirmation received. Left message for patient asking if she wants to pick up form or not.

## 2023-05-25 NOTE — Progress Notes (Signed)
Remote ICD transmission.   

## 2023-06-09 ENCOUNTER — Ambulatory Visit: Payer: Medicare HMO | Admitting: Physician Assistant

## 2023-06-14 ENCOUNTER — Ambulatory Visit (INDEPENDENT_AMBULATORY_CARE_PROVIDER_SITE_OTHER): Payer: Medicare HMO

## 2023-06-14 DIAGNOSIS — I5022 Chronic systolic (congestive) heart failure: Secondary | ICD-10-CM | POA: Diagnosis not present

## 2023-06-14 DIAGNOSIS — Z95810 Presence of automatic (implantable) cardiac defibrillator: Secondary | ICD-10-CM | POA: Diagnosis not present

## 2023-06-18 ENCOUNTER — Telehealth: Payer: Self-pay

## 2023-06-18 NOTE — Telephone Encounter (Signed)
Remote ICM transmission received.  Attempted call to patient regarding ICM remote transmission and no answer.  

## 2023-06-18 NOTE — Progress Notes (Signed)
EPIC Encounter for ICM Monitoring  Patient Name: Lori Beard is a 72 y.o. female Date: 06/18/2023 Primary Care Physican: [name], L.August Saucer, MD Primary Cardiologist: Nahser Electrophysiologist: [name] 11/06/2022 Weight: 200 lbs 04/07/2023 Weight: 199 lbs 06/18/2023 Weight: 199 lbs                                                  Spoke with patient and heart failure questions reviewed.  Transmission results reviewed.  Pt asymptomatic for fluid accumulation.  Reports feeling well at this time and voices no complaints.      Coruve thoracic impedance suggesting possible fluid accumulation starting 10/2.   Prescribed: Furosemide 20 mg take 1 tablet daily. Potassium 20 mEq take 2 tablets daily.   Labs: 04/02/2023 Creatinine 1.33, BUN 13, Potassium 4.3, Sodium 141, GFR 43 A complete set of results can be found in Results Review.   Recommendations:    Recommendation to limit salt intake to 2000 mg daily and fluid intake to 64 oz daily.  Encouraged to call if experiencing any fluid symptoms.    Follow-up plan: ICM clinic phone appointment on 06/21/2023 to recheck fluid levels.   91 day device clinic remote transmission 08/11/2023.     EP/Cardiology Next Visit:   07/12/2023 with Dr [name].   07/06/2023 with Dr Elease Hashimoto.   Copy of ICM check sent to Dr. [name].    3 month ICM trend: 06/14/2023.    12-14 Month ICM trend:     Karie Soda, RN 06/18/2023 10:42 AM

## 2023-06-21 ENCOUNTER — Ambulatory Visit: Payer: Medicare HMO | Attending: Internal Medicine

## 2023-06-21 DIAGNOSIS — I5022 Chronic systolic (congestive) heart failure: Secondary | ICD-10-CM

## 2023-06-21 DIAGNOSIS — Z95810 Presence of automatic (implantable) cardiac defibrillator: Secondary | ICD-10-CM

## 2023-06-23 ENCOUNTER — Other Ambulatory Visit: Payer: Self-pay | Admitting: Cardiovascular Disease

## 2023-06-23 NOTE — Progress Notes (Signed)
EPIC Encounter for ICM Monitoring  Patient Name: Lori Beard is a 72 y.o. female Date: 06/23/2023 Primary Care Physican: [name], L.August Saucer, MD Primary Cardiologist: Nahser Electrophysiologist: [name] 11/06/2022 Weight: 200 lbs 04/07/2023 Weight: 199 lbs 06/18/2023 Weight: 199 lbs                                                  Spoke with patient and heart failure questions reviewed.  Transmission results reviewed.  Pt asymptomatic for fluid accumulation.  Reports feeling well at this time and voices no complaints.      Coruve thoracic impedance suggesting fluid levels returned to normal.   Prescribed: Furosemide 20 mg take 1 tablet daily. Potassium 20 mEq take 2 tablets daily.   Labs: 04/02/2023 Creatinine 1.33, BUN 13, Potassium 4.3, Sodium 141, GFR 43 A complete set of results can be found in Results Review.   Recommendations:     Encouraged to call if experiencing any fluid symptoms.    Follow-up plan: ICM clinic phone appointment on 07/19/2023.   91 day device clinic remote transmission 08/11/2023.     EP/Cardiology Next Visit:   07/12/2023 with Dr [name].   07/06/2023 with Dr Elease Hashimoto.   Copy of ICM check sent to Dr. [name].    3 month ICM trend: 06/21/2023.    12-14 Month ICM trend:     Karie Soda, RN 06/23/2023 10:47 AM

## 2023-06-24 ENCOUNTER — Encounter: Payer: Self-pay | Admitting: *Deleted

## 2023-06-24 DIAGNOSIS — Z006 Encounter for examination for normal comparison and control in clinical research program: Secondary | ICD-10-CM

## 2023-06-24 NOTE — Research (Signed)
Message left for Ms Worrall about Lourena Simmonds. Encouraged her to call me back if interested or with any questions.

## 2023-07-06 ENCOUNTER — Ambulatory Visit: Payer: Medicare HMO | Admitting: Cardiovascular Disease

## 2023-07-12 ENCOUNTER — Ambulatory Visit: Payer: Medicare HMO | Attending: Internal Medicine | Admitting: Internal Medicine

## 2023-07-13 ENCOUNTER — Encounter: Payer: Self-pay | Admitting: Internal Medicine

## 2023-07-19 ENCOUNTER — Ambulatory Visit: Payer: Medicare HMO | Attending: Internal Medicine

## 2023-07-19 DIAGNOSIS — Z95810 Presence of automatic (implantable) cardiac defibrillator: Secondary | ICD-10-CM

## 2023-07-19 DIAGNOSIS — I5022 Chronic systolic (congestive) heart failure: Secondary | ICD-10-CM

## 2023-07-20 ENCOUNTER — Ambulatory Visit: Payer: Medicare HMO | Attending: Cardiovascular Disease | Admitting: Cardiovascular Disease

## 2023-07-20 ENCOUNTER — Encounter: Payer: Self-pay | Admitting: Cardiovascular Disease

## 2023-07-20 VITALS — BP 114/76 | HR 74 | Ht 63.0 in | Wt 199.8 lb

## 2023-07-20 DIAGNOSIS — I2510 Atherosclerotic heart disease of native coronary artery without angina pectoris: Secondary | ICD-10-CM | POA: Diagnosis not present

## 2023-07-20 DIAGNOSIS — Z79899 Other long term (current) drug therapy: Secondary | ICD-10-CM | POA: Diagnosis not present

## 2023-07-20 DIAGNOSIS — E782 Mixed hyperlipidemia: Secondary | ICD-10-CM | POA: Diagnosis not present

## 2023-07-20 DIAGNOSIS — I5022 Chronic systolic (congestive) heart failure: Secondary | ICD-10-CM

## 2023-07-20 NOTE — Patient Instructions (Signed)
 Lab Work: Lipids, ALT, BMET today If you have labs (blood work) drawn today and your tests are completely normal, you will receive your results only by: MyChart Message (if you have MyChart) OR A paper copy in the mail If you have any lab test that is abnormal or we need to change your treatment, we will call you to review the results.  Follow-Up: At [facility], you and your health needs are our priority.  As part of our continuing mission to provide you with exceptional heart care, we have created designated Provider Care Teams.  These Care Teams include your primary Cardiologist (physician) and Advanced Practice Providers (APPs -  Physician Assistants and Nurse Practitioners) who all work together to provide you with the care you need, when you need it.  Your next appointment:   1 year(s)  Provider:   Kristeen Miss, MD

## 2023-07-20 NOTE — Progress Notes (Signed)
Cardiology Office Note   Date:  07/20/2023   ID:  Lori Beard, Lori Beard 07/13/1951, MRN [id number]  PCP:  [name], Elbert Ewings.August Saucer, MD (Inactive)  Cardiologist:   Kristeen Miss, MD   No chief complaint on file.       Lori Beard is a 72 y.o. female who presents for   1. Chronic systolic congestive heart failure 2. Hypertension 3 .obesty - s/p lap band surgery 4. coronary artery disease-status post non-ST segment elevation myocardial infarction complicated by ventricular tachycardia 5. Status post ICD placement 08/04/13   Previous notes Notes from [place] [facility] - 09/04/14:  Lori Beard is a 72 y.o. female with a hx of HTN, obesity s/p LAP-BAND surgery and sleep apnea. She was admitted 07/2013 with pneumonia. She ruled in for a NSTEMI. Her hospitalization was complicated by monomorphic VTach that progressed to VFib due to R on T PVC in the setting of hypokalemia with a K of 2.9.  Echocardiogram (08/01/13): Mild LVH, global HK, EF 40-45%, Gr 1 DD, MAC, mild RVE, mildly reduced RVSF, PASP 31-35.  LHC (08/02/13):  Normal coronary arteries, EF 35%, LVEDP 17.  She was seen by EP and underwent ICD implantation for secondary prevention of malignant ventricular arrhythmias in the setting of nonischemic cardiomyopathy. She underwent St. [name] single-chamber defibrillator implantation by Dr. [name] 08/04/13.  I saw her 08/11/13.  She had resumed verapamil and metoprolol due to high blood pressures. I stopped these medications and placed on losartan and increased her BiDil to 3x per day.    Doing well since last seen.  The patient denies chest pain, shortness of breath, syncope, orthopnea, PND or significant pedal edema.  No ICD shocks.   Jan. 30, 2015:  Lori Beard presents today for followup evaluation of her hypertension and chronic systolic congestive heart failure. Her left ventricular ejection fraction is approximate 35%. She has a history of a cardiac arrest and has had an ICD  placed. She sees Dr. [name] for management of that.  12/27/2013:  Doing well  Oct. 26, 2015:  She is only taking the BiDil twice a day.  He causes profound weakness and dizziness.  She was originally written to take it 3 times a day but she's not able to take it twice a day.   . Even still, he causes her to be very weak and she has to  lie down after taking it for the next 30 minutes to one hour. Otherwise she's doing well. He's not having any profound shortness of breath. No chest pain  Jan. 26, 2016:  Doing well.  No cp or dyspnea.   Is on hydralazine and ISDN instead of BiDIl now.    Jan. 19, 2017:  Lori Beard is doing well. No CP or dyspnea.  Has CAD - hx of MI Has CHF and and ICD .   June 07, 2018: Lori Beard is seen today for follow-up visit of her coronary artery disease and Chronic systolic congestive heart failure.  Last echocardiogram was May 2019 which reveals left ventricular ejection fraction of 50%. She has mild pulmonary artery hypertension with an estimated PA pressure of 38 mmHg.  Had a "hot sensation" yesterday  Had numbness in her left arm and left fingers.  Not associated with any chest pain .  Went to ER.  She was started on gabapentin which seems to have helped.  Walks frequently .   No CP or dyspnea.     Sept. 15, 2020  Lori Beard is seen  today for CAD and CHF Doing well.   Needs to restart her exercise No significant CP  Has ICD  Last echocardiogram from May 2019 reveals normal left ventricular systolic function with an ejection fraction of 50 to 55%.  Feb. 16, 2022:  Lori Beard is seen today for CAD and CHF Wt is 201 lbs ( down 15 lbs from her previous visit with me )  No cp or dyspnea.    Has a single chanber ICD for hx of VF.[name]  Able to do some exercise    December 29, 2021: Lori Beard is seen today for follow-up of her coronary artery disease and congestive heart failure. Weight today is 195 pounds. BP is a bit elevated. - has not taken her meds today     Nov. 12, 2024  Lori Beard is seen for follow up of her CAD , CHF  Follows with Dr. [name] for her ICD   No cp, no dyspnea Some exercise  Cannot afford the [place]      Past Medical History:  Diagnosis Date   Aneurysm of right iliac artery (HCC)    Arthritis    ALL OVER   Cardiac arrest (HCC) 08/01/2013   Polymorphic VT   Chronic systolic CHF (congestive heart failure) (HCC)    GERD (gastroesophageal reflux disease)    Headache    migraines   Hypertension    Morbid obesity (HCC)    a. s/p lapband surgery 11/2012.   Myocardial infarction (HCC) 08/06/2013   NICM (nonischemic cardiomyopathy) (HCC)    Echocardiogram (08/01/13): Mild LVH, global HK, EF 40-45%, Gr 1 DD, MAC, mild RVE, mildly reduced RVSF, PASP 31-35.  LHC (08/02/13):  Normal coronary arteries, EF 35%, LVEDP 17.   Pneumonia 08/01/2013   Presence of permanent cardiac pacemaker    Renal failure, acute (HCC) 08/01/2013   S/P implantation of automatic cardioverter/defibrillator (AICD)    07/2013 [name])   Sleep apnea 02/15/2012   SLEEP STUDY IN EPIC - MILD OSA-CPAP NOT RECOMMENDED-HOME OXYGEN SUGGESTED BECAUSE OF OXYGEN DESATS ON ROOM AIR.   Ventricular tachycardia (HCC)    a. R on T PVC during admission for pneumonia/NSTEMI => monomorphic VT=>VF=>defib; s/p ICD    Past Surgical History:  Procedure Laterality Date   ABDOMINAL HYSTERECTOMY     BREATH TEK H PYLORI  02/15/2012   Procedure: BREATH TEK H PYLORI;  Surgeon: Valarie Merino, MD;  Location: [facility];  Service: General;  Laterality: N/A;  745   CARDIAC CATHETERIZATION  08/02/2013   Normal coronary arteries, EF 35%    CHOLECYSTECTOMY     IMPLANTABLE CARDIOVERTER DEFIBRILLATOR IMPLANT N/A 08/04/2013   Procedure: IMPLANTABLE CARDIOVERTER DEFIBRILLATOR IMPLANT;  Surgeon: Marinus Maw, MD;  Location: [facility] CATH LAB;  Service: Cardiovascular;  Laterality: N/A;   JOINT REPLACEMENT     BILATERAL TOTAL HIP REPLACEMENTS   KNEE ARTHROSCOPY     LAPAROSCOPIC  GASTRIC BANDING     LEFT HEART CATHETERIZATION WITH CORONARY ANGIOGRAM N/A 08/02/2013   Procedure: LEFT HEART CATHETERIZATION WITH CORONARY ANGIOGRAM;  Surgeon: Iran Ouch, MD;  Location: [facility] CATH LAB;  Service: Cardiovascular;  Laterality: N/A;   MESH APPLIED TO LAP PORT N/A 11/22/2012   Procedure: MESH APPLIED TO LAP PORT;  Surgeon: Valarie Merino, MD;  Location: [facility];  Service: General;  Laterality: N/A;   PACEMAKER INSERTION   08/04/2013   St. [name] single chamber ICD     Current Outpatient Medications  Medication Sig Dispense Refill   amitriptyline (ELAVIL) 25 MG tablet  Take 75 mg by mouth at bedtime.     aspirin 81 MG tablet Take 1 tablet (81 mg total) by mouth every evening.     aspirin-acetaminophen-caffeine (EXCEDRIN MIGRAINE) 250-250-65 MG tablet Take 3 tablets by mouth daily as needed for headache or migraine.     atorvastatin (LIPITOR) 10 MG tablet Take 1 tablet by mouth once daily 90 tablet 2   carvedilol (COREG) 25 MG tablet Take 1 tablet (25 mg total) by mouth 2 (two) times daily with a meal. 180 tablet 3   cetirizine (ZYRTEC) 5 MG tablet Take 1 tablet (5 mg total) by mouth daily. 14 tablet 1   folic acid (FOLVITE) 1 MG tablet Take 1 mg by mouth every morning.     furosemide (LASIX) 20 MG tablet TAKE 1 TABLET BY MOUTH IN THE MORNING 90 tablet 3   hydrALAZINE (APRESOLINE) 25 MG tablet Take 1 tablet (25 mg total) by mouth 3 (three) times daily. 270 tablet 3   hydrocerin (EUCERIN) CREA Apply 1 application topically 2 (two) times daily. (Patient taking differently: Apply 1 application  topically daily.) 454 g 0   HYDROcodone-acetaminophen (NORCO) 7.5-325 MG tablet Take 1 tablet by mouth every 8 (eight) hours as needed for moderate pain or severe pain.   0   isosorbide dinitrate (ISORDIL) 20 MG tablet Take 1 tablet by mouth twice daily (Patient taking differently: Take 20 mg by mouth 2 (two) times daily.) 180 tablet 0   losartan (COZAAR) 50 MG tablet Take 1 tablet (50 mg  total) by mouth daily. 30 tablet 2   methocarbamol (ROBAXIN) 500 MG tablet Take 500 mg by mouth 3 (three) times daily as needed.     polyethylene glycol powder (GLYCOLAX/MIRALAX) 17 GM/SCOOP powder Take 17 g by mouth daily as needed for mild constipation. 1 Capful(s) By Mouth     potassium chloride SA (KLOR-CON M) 20 MEQ tablet Take 2 tablets by mouth once daily 60 tablet 0   triamcinolone ointment (KENALOG) 0.5 % Apply 1 application topically 2 (two) times daily. (Patient taking differently: Apply 1 application  topically as needed (rash).) 30 g 0   Vitamin D, Ergocalciferol, (DRISDOL) 1.25 MG (50000 UNIT) CAPS capsule Take 50,000 Units by mouth once a week.     zolpidem (AMBIEN) 10 MG tablet Take 10 mg by mouth as needed for sleep.     dapagliflozin propanediol (FARXIGA) 10 MG TABS tablet Take 1 tablet (10 mg total) by mouth daily before breakfast. (Patient not taking: Reported on 07/20/2023) 30 tablet 5   pantoprazole (PROTONIX) 40 MG tablet Take 40 mg by mouth daily. (Patient not taking: Reported on 07/20/2023)     No current facility-administered medications for this visit.    Allergies:   Iron, Ace inhibitors, Etodolac, and Morphine and codeine    Social History:  The patient  reports that she has never smoked. She has never used smokeless tobacco. She reports that she does not drink alcohol and does not use drugs.   Family History:  The patient's family history includes Breast cancer in her sister; Colon cancer in her father; Heart disease in her father; Hypertension in her father and paternal grandmother; Kidney disease in her mother; Stroke in her paternal grandmother.    ROS:  Please see the history of present illness.   Otherwise, review of systems are positive for none.   All other systems are reviewed and negative.   Physical Exam: Blood pressure 114/76, pulse 74, height 5\' 3"  (1.6 m), weight 199  lb 12.8 oz (90.6 kg), SpO2 98%.  GEN:  Well nourished, well developed in no acute  distress HEENT: Normal NECK: No JVD; No carotid bruits LYMPHATICS: No lymphadenopathy CARDIAC: RRR , no murmurs, rubs, gallops RESPIRATORY:  Clear to auscultation without rales, wheezing or rhonchi  ABDOMEN: Soft, non-tender, non-distended MUSCULOSKELETAL:  No edema; No deformity  SKIN: Warm and dry NEUROLOGIC:  Alert and oriented x 3    EKG:     December 29, 2021: Normal sinus rhythm at 61.  Normal EKG.  Recent Labs: 04/02/2023: BNP 18.1; BUN 13; Creatinine, Ser 1.33; Hemoglobin 11.2; Platelets 199; Potassium 4.3; Sodium 141    Lipid Panel    Component Value Date/Time   CHOL 110 05/23/2019 1100   TRIG 74 05/23/2019 1100   HDL 44 05/23/2019 1100   CHOLHDL 2.5 05/23/2019 1100   CHOLHDL 3.0 09/26/2015 1558   VLDL 17 09/26/2015 1558   LDLCALC 51 05/23/2019 1100      Wt Readings from Last 3 Encounters:  07/20/23 199 lb 12.8 oz (90.6 kg)  04/02/23 199 lb 4.8 oz (90.4 kg)  06/15/22 195 lb 8.8 oz (88.7 kg)      Other studies Reviewed: Additional studies/ records that were reviewed today include: . Review of the above records demonstrates:    ASSESSMENT AND PLAN:  1. Chronic systolic congestive heart failure :  Doing well .  Cont current meds.  She has class II CHF .     2. Hypertension-  BP is typically well controllled.   Has not taken her BP meds today   3 .obesty -        4. coronary artery disease      .  5. Status post ICD placement 08/04/13 -   f    Current medicines are reviewed at length with the patient today.  The patient does not have concerns regarding medicines.  The following changes have been made:  no change  Labs/ tests ordered today include: BMP       No orders of the defined types were placed in this encounter.  Disposition:   APP or me in 1 year   Signed, Kristeen Miss, MD  07/20/2023 10:09 AM    [facility] [address] [place], [place], [place]  [address] Phone: [phone]; Fax: [phone]

## 2023-07-21 LAB — BASIC METABOLIC PANEL
BUN/Creatinine Ratio: 14 (ref 12–28)
BUN: 20 mg/dL (ref 8–27)
CO2: 28 mmol/L (ref 20–29)
Calcium: 8.8 mg/dL (ref 8.7–10.3)
Chloride: 104 mmol/L (ref 96–106)
Creatinine, Ser: 1.39 mg/dL — ABNORMAL HIGH (ref 0.57–1.00)
Glucose: 79 mg/dL (ref 70–99)
Potassium: 4.3 mmol/L (ref 3.5–5.2)
Sodium: 142 mmol/L (ref 134–144)
eGFR: 40 mL/min/{1.73_m2} — ABNORMAL LOW (ref >59)

## 2023-07-21 LAB — LIPID PANEL
Chol/HDL Ratio: 2.2 ratio (ref 0.0–4.4)
Cholesterol, Total: 100 mg/dL (ref 100–199)
HDL: 45 mg/dL (ref >39)
LDL Chol Calc (NIH): 42 mg/dL (ref 0–99)
Triglycerides: 57 mg/dL (ref 0–149)
VLDL Cholesterol Cal: 13 mg/dL (ref 5–40)

## 2023-07-21 LAB — ALT: ALT: 4 [IU]/L (ref 0–32)

## 2023-07-23 NOTE — Progress Notes (Signed)
EPIC Encounter for ICM Monitoring  Patient Name: Lori Beard is a 72 y.o. female Date: 07/23/2023 Primary Care Physican: [name], L.August Saucer, MD (Inactive) Primary Cardiologist: Nahser Electrophysiologist: [name] 11/06/2022 Weight: 200 lbs 04/07/2023 Weight: 199 lbs 06/18/2023 Weight: 199 lbs 07/23/2023 Weight: 199 lbs                                                  Spoke with patient and heart failure questions reviewed.  Transmission results reviewed.  Pt asymptomatic for fluid accumulation.  Reports feeling well at this time and voices no complaints.     Coruve thoracic impedance suggesting intermittent days with possible fluid accumulation within the last month.   Prescribed: Furosemide 20 mg take 1 tablet daily. Potassium 20 mEq take 2 tablets daily.   Labs: 04/02/2023 Creatinine 1.33, BUN 13, Potassium 4.3, Sodium 141, GFR 43 A complete set of results can be found in Results Review.   Recommendations:     No changes and encouraged to call if experiencing any fluid symptoms.   Follow-up plan: ICM clinic phone appointment on 09/06/2023.   91 day device clinic remote transmission 08/11/2023.     EP/Cardiology Next Visit:  08/17/2023 with Dr [name].   Recall 07/14/2024 with Dr Elease Hashimoto.   Copy of ICM check sent to Dr. [name].    3 month ICM trend: 07/19/2023.    12-14 Month ICM trend:     Karie Soda, RN 07/23/2023 7:43 AM

## 2023-08-11 ENCOUNTER — Ambulatory Visit (INDEPENDENT_AMBULATORY_CARE_PROVIDER_SITE_OTHER): Payer: Medicare HMO

## 2023-08-11 DIAGNOSIS — I428 Other cardiomyopathies: Secondary | ICD-10-CM

## 2023-08-12 LAB — CUP PACEART REMOTE DEVICE CHECK
Battery Remaining Longevity: 17 mo
Battery Remaining Percentage: 16 %
Battery Voltage: 2.74 V
Brady Statistic RV Percent Paced: 1 %
Date Time Interrogation Session: 20241204020027
HighPow Impedance: 68 Ohm
HighPow Impedance: 68 Ohm
Lead Channel Impedance Value: 330 Ohm
Lead Channel Pacing Threshold Amplitude: 0.75 V
Lead Channel Pacing Threshold Pulse Width: 0.5 ms
Lead Channel Sensing Intrinsic Amplitude: 5.8 mV
Lead Channel Setting Pacing Amplitude: 2.5 V
Lead Channel Setting Pacing Pulse Width: 0.5 ms
Lead Channel Setting Sensing Sensitivity: 0.5 mV
Pulse Gen Serial Number: 7136396

## 2023-08-17 ENCOUNTER — Ambulatory Visit: Payer: Medicare HMO | Attending: Internal Medicine | Admitting: Internal Medicine

## 2023-08-17 ENCOUNTER — Encounter: Payer: Self-pay | Admitting: Internal Medicine

## 2023-08-17 VITALS — BP 146/88 | HR 73 | Ht 63.0 in | Wt 203.0 lb

## 2023-08-17 DIAGNOSIS — I428 Other cardiomyopathies: Secondary | ICD-10-CM

## 2023-08-17 LAB — CUP PACEART INCLINIC DEVICE CHECK
Battery Remaining Longevity: 15 mo
Brady Statistic RV Percent Paced: 0 %
Date Time Interrogation Session: 20241210135138
HighPow Impedance: 61.875
Implantable Lead Connection Status: 753985
Implantable Lead Implant Date: 20141128
Implantable Lead Location: 753860
Implantable Lead Model: 7122
Implantable Pulse Generator Implant Date: 20141128
Lead Channel Impedance Value: 362.5 Ohm
Lead Channel Pacing Threshold Amplitude: 0.75 V
Lead Channel Pacing Threshold Amplitude: 0.75 V
Lead Channel Pacing Threshold Pulse Width: 0.5 ms
Lead Channel Pacing Threshold Pulse Width: 0.5 ms
Lead Channel Sensing Intrinsic Amplitude: 8.8 mV
Lead Channel Setting Pacing Amplitude: 2.5 V
Lead Channel Setting Pacing Pulse Width: 0.5 ms
Lead Channel Setting Sensing Sensitivity: 0.5 mV
Pulse Gen Serial Number: 7136396

## 2023-08-17 NOTE — Progress Notes (Signed)
HPI [name] returns today for followup. She has a h/o non-ischemic CM, VF arrest, obesity and HTN. She has done well in the interim with no ICD therapies, no chest pain or sob. No edema. She admits to some dietary indiscretion.     Allergies  Allergen Reactions   Iron Other (See Comments)    Gives patient migraine headaches when supplemented   Ace Inhibitors Itching and Cough   Etodolac Itching   Morphine And Codeine Itching and Rash     Current Outpatient Medications  Medication Sig Dispense Refill   amitriptyline (ELAVIL) 25 MG tablet Take 75 mg by mouth at bedtime.     aspirin 81 MG tablet Take 1 tablet (81 mg total) by mouth every evening.     aspirin-acetaminophen-caffeine (EXCEDRIN MIGRAINE) 250-250-65 MG tablet Take 3 tablets by mouth daily as needed for headache or migraine.     atorvastatin (LIPITOR) 10 MG tablet Take 1 tablet by mouth once daily 90 tablet 2   carvedilol (COREG) 25 MG tablet Take 1 tablet (25 mg total) by mouth 2 (two) times daily with a meal. 180 tablet 3   cetirizine (ZYRTEC) 5 MG tablet Take 1 tablet (5 mg total) by mouth daily. 14 tablet 1   dapagliflozin propanediol (FARXIGA) 10 MG TABS tablet Take 1 tablet (10 mg total) by mouth daily before breakfast. 30 tablet 5   folic acid (FOLVITE) 1 MG tablet Take 1 mg by mouth every morning.     furosemide (LASIX) 20 MG tablet TAKE 1 TABLET BY MOUTH IN THE MORNING 90 tablet 3   hydrALAZINE (APRESOLINE) 25 MG tablet Take 1 tablet (25 mg total) by mouth 3 (three) times daily. 270 tablet 3   hydrocerin (EUCERIN) CREA Apply 1 application topically 2 (two) times daily. (Patient taking differently: Apply 1 application  topically daily.) 454 g 0   HYDROcodone-acetaminophen (NORCO) 7.5-325 MG tablet Take 1 tablet by mouth every 8 (eight) hours as needed for moderate pain or severe pain.   0   isosorbide dinitrate (ISORDIL) 20 MG tablet Take 1 tablet by mouth twice daily (Patient taking differently: Take 20 mg  by mouth 2 (two) times daily.) 180 tablet 0   losartan (COZAAR) 50 MG tablet Take 1 tablet (50 mg total) by mouth daily. 30 tablet 2   methocarbamol (ROBAXIN) 500 MG tablet Take 500 mg by mouth 3 (three) times daily as needed.     pantoprazole (PROTONIX) 40 MG tablet Take 40 mg by mouth daily.     polyethylene glycol powder (GLYCOLAX/MIRALAX) 17 GM/SCOOP powder Take 17 g by mouth daily as needed for mild constipation. 1 Capful(s) By Mouth     potassium chloride SA (KLOR-CON M) 20 MEQ tablet Take 2 tablets by mouth once daily 60 tablet 0   triamcinolone ointment (KENALOG) 0.5 % Apply 1 application topically 2 (two) times daily. (Patient taking differently: Apply 1 application  topically as needed (rash).) 30 g 0   Vitamin D, Ergocalciferol, (DRISDOL) 1.25 MG (50000 UNIT) CAPS capsule Take 50,000 Units by mouth once a week.     zolpidem (AMBIEN) 10 MG tablet Take 10 mg by mouth as needed for sleep.     No current facility-administered medications for this visit.     Past Medical History:  Diagnosis Date   Aneurysm of right iliac artery (HCC)    Arthritis    ALL OVER   Cardiac arrest (HCC) 08/01/2013   Polymorphic VT   Chronic systolic CHF (  congestive heart failure) (HCC)    GERD (gastroesophageal reflux disease)    Headache    migraines   Hypertension    Morbid obesity (HCC)    a. s/p lapband surgery 11/2012.   Myocardial infarction (HCC) 08/06/2013   NICM (nonischemic cardiomyopathy) (HCC)    Echocardiogram (08/01/13): Mild LVH, global HK, EF 40-45%, Gr 1 DD, MAC, mild RVE, mildly reduced RVSF, PASP 31-35.  LHC (08/02/13):  Normal coronary arteries, EF 35%, LVEDP 17.   Pneumonia 08/01/2013   Presence of permanent cardiac pacemaker    Renal failure, acute (HCC) 08/01/2013   S/P implantation of automatic cardioverter/defibrillator (AICD)    07/2013 [name])   Sleep apnea 02/15/2012   SLEEP STUDY IN EPIC - MILD OSA-CPAP NOT RECOMMENDED-HOME OXYGEN SUGGESTED BECAUSE OF OXYGEN DESATS ON  ROOM AIR.   Ventricular tachycardia (HCC)    a. R on T PVC during admission for pneumonia/NSTEMI => monomorphic VT=>VF=>defib; s/p ICD    ROS:   All systems reviewed and negative except as noted in the HPI.   Past Surgical History:  Procedure Laterality Date   ABDOMINAL HYSTERECTOMY     BREATH TEK H PYLORI  02/15/2012   Procedure: BREATH TEK H PYLORI;  Surgeon: Valarie Merino, MD;  Location: [facility];  Service: General;  Laterality: N/A;  745   CARDIAC CATHETERIZATION  08/02/2013   Normal coronary arteries, EF 35%    CHOLECYSTECTOMY     IMPLANTABLE CARDIOVERTER DEFIBRILLATOR IMPLANT N/A 08/04/2013   Procedure: IMPLANTABLE CARDIOVERTER DEFIBRILLATOR IMPLANT;  Surgeon: Marinus Maw, MD;  Location: [facility] CATH LAB;  Service: Cardiovascular;  Laterality: N/A;   JOINT REPLACEMENT     BILATERAL TOTAL HIP REPLACEMENTS   KNEE ARTHROSCOPY     LAPAROSCOPIC GASTRIC BANDING     LEFT HEART CATHETERIZATION WITH CORONARY ANGIOGRAM N/A 08/02/2013   Procedure: LEFT HEART CATHETERIZATION WITH CORONARY ANGIOGRAM;  Surgeon: Iran Ouch, MD;  Location: [facility] CATH LAB;  Service: Cardiovascular;  Laterality: N/A;   MESH APPLIED TO LAP PORT N/A 11/22/2012   Procedure: MESH APPLIED TO LAP PORT;  Surgeon: Valarie Merino, MD;  Location: [facility]S;  Service: General;  Laterality: N/A;   PACEMAKER INSERTION   08/04/2013   St. [name] single chamber ICD     Family History  Problem Relation Age of Onset   Kidney disease Mother    Heart disease Father    Hypertension Father    Colon cancer Father    Stroke Paternal Grandmother    Hypertension Paternal Grandmother    Breast cancer Sister    Heart attack Neg Hx      Social History   Socioeconomic History   Marital status: Married    Spouse name: Not on file   Number of children: Not on file   Years of education: Not on file   Highest education level: Not on file  Occupational History   Not on file  Tobacco Use   Smoking status: Never    Smokeless tobacco: Never  Vaping Use   Vaping status: Never Used  Substance and Sexual Activity   Alcohol use: No   Drug use: No   Sexual activity: Not Currently  Other Topics Concern   Not on file  Social History Narrative   Not on file   Social Determinants of Health   Financial Resource Strain: Not on file  Food Insecurity: Not on file  Transportation Needs: Not on file  Physical Activity: Not on file  Stress: Not on file  Social Connections: Not on file  Intimate Partner Violence: Not on file     BP (!) 146/88   Pulse 73   Ht 5\' 3"  (1.6 m)   Wt 203 lb (92.1 kg)   SpO2 97%   BMI 35.96 kg/m   Physical Exam:  Well appearing NAD HEENT: Unremarkable Neck:  No JVD, no thyromegally Lymphatics:  No adenopathy Back:  No CVA tenderness Lungs:  Clear HEART:  Regular rate rhythm, no murmurs, no rubs, no clicks Abd:  soft, positive bowel sounds, no organomegally, no rebound, no guarding Ext:  2 plus pulses, no edema, no cyanosis, no clubbing Skin:  No rashes no nodules Neuro:  CN II through XII intact, motor grossly intact  DEVICE  Normal device function.  See PaceArt for details.   Assess/Plan:  1. VF - she has not had more arrhythmias.  2. ICD - her St. [name] single chamber ICD is working normally. 3. Chronic systolic heart failure - her symptoms are class 2. She will continue her current meds. 4. Obesity - I encouraged her to maintain a low sodium diet.    Leonia Reeves.D

## 2023-08-17 NOTE — Patient Instructions (Signed)

## 2023-09-06 ENCOUNTER — Ambulatory Visit: Payer: Medicare HMO | Attending: Internal Medicine

## 2023-09-06 DIAGNOSIS — I5022 Chronic systolic (congestive) heart failure: Secondary | ICD-10-CM

## 2023-09-06 DIAGNOSIS — Z95810 Presence of automatic (implantable) cardiac defibrillator: Secondary | ICD-10-CM

## 2023-09-09 ENCOUNTER — Other Ambulatory Visit: Payer: Self-pay | Admitting: Family Medicine

## 2023-09-09 DIAGNOSIS — I723 Aneurysm of iliac artery: Secondary | ICD-10-CM

## 2023-09-10 ENCOUNTER — Telehealth: Payer: Self-pay

## 2023-09-10 NOTE — Telephone Encounter (Signed)
 Remote ICM transmission received.  Attempted call to patient regarding ICM remote transmission and left detailed message per DPR.  Left ICM phone number and advised to return call for any fluid symptoms or questions. Next ICM remote transmission scheduled 10/11/2023.

## 2023-09-23 ENCOUNTER — Other Ambulatory Visit: Payer: Medicare HMO

## 2023-10-08 ENCOUNTER — Ambulatory Visit
Admission: RE | Admit: 2023-10-08 | Discharge: 2023-10-08 | Disposition: A | Discharge status: Home / Self Care | Payer: Medicare HMO | Source: Ambulatory Visit | Attending: Family Medicine | Admitting: Family Medicine

## 2023-10-08 DIAGNOSIS — I723 Aneurysm of iliac artery: Secondary | ICD-10-CM

## 2023-10-08 MED ORDER — IOPAMIDOL (ISOVUE-370) INJECTION 76%
500.0000 mL | Freq: Once | INTRAVENOUS | Status: AC | PRN
  Administered 2023-10-08: 75 mL via INTRAVENOUS

## 2023-10-11 ENCOUNTER — Ambulatory Visit: Payer: Medicare HMO | Attending: Internal Medicine

## 2023-10-11 DIAGNOSIS — I5022 Chronic systolic (congestive) heart failure: Secondary | ICD-10-CM

## 2023-10-11 DIAGNOSIS — Z95810 Presence of automatic (implantable) cardiac defibrillator: Secondary | ICD-10-CM

## 2023-11-10 ENCOUNTER — Ambulatory Visit (INDEPENDENT_AMBULATORY_CARE_PROVIDER_SITE_OTHER)

## 2023-11-10 ENCOUNTER — Ambulatory Visit: Payer: Self-pay

## 2023-11-10 DIAGNOSIS — I428 Other cardiomyopathies: Secondary | ICD-10-CM

## 2023-11-15 ENCOUNTER — Ambulatory Visit: Attending: Internal Medicine

## 2023-11-15 DIAGNOSIS — Z95810 Presence of automatic (implantable) cardiac defibrillator: Secondary | ICD-10-CM | POA: Diagnosis not present

## 2023-11-15 DIAGNOSIS — I5022 Chronic systolic (congestive) heart failure: Secondary | ICD-10-CM

## 2023-11-16 LAB — CUP PACEART REMOTE DEVICE CHECK
Battery Remaining Longevity: 13 mo
Battery Remaining Percentage: 13 %
Battery Voltage: 2.71 V
Brady Statistic RV Percent Paced: 1 %
Date Time Interrogation Session: 20250305020038
HighPow Impedance: 73 Ohm
HighPow Impedance: 73 Ohm
Lead Channel Impedance Value: 410 Ohm
Lead Channel Pacing Threshold Amplitude: 0.75 V
Lead Channel Pacing Threshold Pulse Width: 0.5 ms
Lead Channel Sensing Intrinsic Amplitude: 8.9 mV
Lead Channel Setting Pacing Amplitude: 2.5 V
Lead Channel Setting Pacing Pulse Width: 0.5 ms
Lead Channel Setting Sensing Sensitivity: 0.5 mV
Pulse Gen Serial Number: 7136396

## 2023-11-17 ENCOUNTER — Encounter: Payer: Self-pay | Admitting: Internal Medicine

## 2023-11-18 NOTE — Progress Notes (Signed)
 EPIC Encounter for ICM Monitoring  Patient Name: Lori Beard is a 73 y.o. female Date: 11/18/2023 Primary Care Physican: [name], L.August Saucer, MD (Inactive) Primary Cardiologist: Nahser Electrophysiologist: [name] 11/06/2022 Weight: 200 lbs 04/07/2023 Weight: 199 lbs 06/18/2023 Weight: 199 lbs 07/23/2023 Weight: 199 lbs 08/17/2023 Office Weight: 203 lbs 11/18/2023 Weight: 200-202 lbs                                                  Spoke with patient and heart failure questions reviewed.  Transmission results reviewed.  Pt asymptomatic for fluid accumulation. No fluid symptoms during decreased impedance.    Coruve thoracic impedance suggesting normal fluid levels with the exception of possible fluid accumulation from 2/26-3/2 and 3/4-3/7.   Prescribed: Furosemide 20 mg take 1 tablet daily. Potassium 20 mEq take 2 tablets daily.   Labs: 07/20/2023 Creatinine 1.39, BUN 20, Potassium 4.3, Sodium 142, GFR 40 04/02/2023 Creatinine 1.33, BUN 13, Potassium 4.3, Sodium 141, GFR 43 A complete set of results can be found in Results Review.   Recommendations:   No changes and encouraged to call if experiencing any fluid symptoms.   Follow-up plan: ICM clinic phone appointment on 12/20/2023.   91 day device clinic remote transmission 02/09/2024.     EP/Cardiology Next Visit:  Recall 08/11/2024 with Dr [name].   Recall 07/14/2024 with Dr Elease Hashimoto.   Copy of ICM check sent to Dr. [name].    3 month ICM trend: 11/15/2023.    12-14 Month ICM trend:     Karie Soda, RN 11/18/2023 8:47 AM

## 2023-12-20 ENCOUNTER — Ambulatory Visit: Attending: Internal Medicine

## 2023-12-20 DIAGNOSIS — I5022 Chronic systolic (congestive) heart failure: Secondary | ICD-10-CM

## 2023-12-20 DIAGNOSIS — Z95810 Presence of automatic (implantable) cardiac defibrillator: Secondary | ICD-10-CM

## 2023-12-23 NOTE — Addendum Note (Signed)
 Addended by: Lott Rouleau A on: 12/23/2023 11:19 AM   Modules accepted: Orders

## 2023-12-23 NOTE — Progress Notes (Signed)
 Remote ICD transmission.

## 2023-12-24 NOTE — Progress Notes (Signed)
 EPIC Encounter for ICM Monitoring  Patient Name: Lori Beard is a 73 y.o. female Date: 12/24/2023 Primary Care Physican: Brenita Callow, L.Rozelle Corning, MD (Inactive) Primary Cardiologist: Nahser Electrophysiologist: Carolynne Citron 11/06/2022 Weight: 200 lbs 04/07/2023 Weight: 199 lbs 06/18/2023 Weight: 199 lbs 07/23/2023 Weight: 199 lbs 08/17/2023 Office Weight: 203 lbs 11/18/2023 Weight: 200-202 lbs     12/24/2023 Weight: 200-201 lbs                                              Spoke with patient and heart failure questions reviewed.  Transmission results reviewed.  Pt asymptomatic for fluid accumulation.     Coruve thoracic impedance suggesting normal fluid levels within the last month.   Prescribed: Furosemide  20 mg take 1 tablet daily. Potassium 20 mEq take 2 tablets daily.   Labs: 07/20/2023 Creatinine 1.39, BUN 20, Potassium 4.3, Sodium 142, GFR 40 04/02/2023 Creatinine 1.33, BUN 13, Potassium 4.3, Sodium 141, GFR 43 A complete set of results can be found in Results Review.   Recommendations:   No changes and encouraged to call if experiencing any fluid symptoms.   Follow-up plan: ICM clinic phone appointment on 01/24/2024.   91 day device clinic remote transmission 02/09/2024.     EP/Cardiology Next Visit:  Recall 08/11/2024 with Dr Carolynne Citron.   Recall 07/14/2024 with Dr Alroy Aspen.   Copy of ICM check sent to Dr. Carolynne Citron.     3 month ICM trend: 12/20/2023.    12-14 Month ICM trend:     [name], RN 12/24/2023 12:05 PM

## 2023-12-29 NOTE — Progress Notes (Signed)
 Remote ICD transmission.

## 2024-01-24 ENCOUNTER — Ambulatory Visit: Attending: Internal Medicine

## 2024-01-24 DIAGNOSIS — Z95810 Presence of automatic (implantable) cardiac defibrillator: Secondary | ICD-10-CM

## 2024-01-24 DIAGNOSIS — I5022 Chronic systolic (congestive) heart failure: Secondary | ICD-10-CM | POA: Diagnosis not present

## 2024-01-26 NOTE — Progress Notes (Signed)
 EPIC Encounter for ICM Monitoring  Patient Name: Lori Beard is a 73 y.o. female Date: 01/26/2024 Primary Care Physican: Brenita Callow, L.Rozelle Corning, MD (Inactive) Primary Cardiologist: Nahser Electrophysiologist: Carolynne Citron 11/06/2022 Weight: 200 lbs 04/07/2023 Weight: 199 lbs 06/18/2023 Weight: 199 lbs 07/23/2023 Weight: 199 lbs 08/17/2023 Office Weight: 203 lbs 11/18/2023 Weight: 200-202 lbs     12/24/2023 Weight: 200-201 lbs  01/26/2024 Weight: 200 lbs                                             Spoke with patient and heart failure questions reviewed.  Transmission results reviewed.  Pt asymptomatic for fluid accumulation.  Reports feeling well at this time and voices no complaints.     Coruve thoracic impedance suggesting normal fluid levels within the last month.   Prescribed: Furosemide  20 mg take 1 tablet daily. Potassium 20 mEq take 2 tablets daily.   Labs: 07/20/2023 Creatinine 1.39, BUN 20, Potassium 4.3, Sodium 142, GFR 40 04/02/2023 Creatinine 1.33, BUN 13, Potassium 4.3, Sodium 141, GFR 43 A complete set of results can be found in Results Review.   Recommendations:   No changes and encouraged to call if experiencing any fluid symptoms.   Follow-up plan: ICM clinic phone appointment on 03/27/2024.   91 day device clinic remote transmission 02/09/2024.     EP/Cardiology Next Visit:  Recall 08/11/2024 with Dr Carolynne Citron.   Recall 07/14/2024 with Dr Alroy Aspen.   Copy of ICM check sent to Dr. Carolynne Citron.     3 month ICM trend: 01/24/2024.    12-14 Month ICM trend:     Lori Jain, RN 01/26/2024 7:26 AM

## 2024-02-09 ENCOUNTER — Ambulatory Visit (INDEPENDENT_AMBULATORY_CARE_PROVIDER_SITE_OTHER): Payer: Self-pay

## 2024-02-09 DIAGNOSIS — I428 Other cardiomyopathies: Secondary | ICD-10-CM

## 2024-02-15 LAB — CUP PACEART REMOTE DEVICE CHECK
Battery Remaining Longevity: 11 mo
Battery Remaining Percentage: 10 %
Battery Voltage: 2.68 V
Brady Statistic RV Percent Paced: 1 %
Date Time Interrogation Session: 20250604023546
HighPow Impedance: 70 Ohm
HighPow Impedance: 70 Ohm
Lead Channel Impedance Value: 390 Ohm
Lead Channel Pacing Threshold Amplitude: 0.75 V
Lead Channel Pacing Threshold Pulse Width: 0.5 ms
Lead Channel Sensing Intrinsic Amplitude: 9.1 mV
Lead Channel Setting Pacing Amplitude: 2.5 V
Lead Channel Setting Pacing Pulse Width: 0.5 ms
Lead Channel Setting Sensing Sensitivity: 0.5 mV
Pulse Gen Serial Number: 7136396

## 2024-02-18 ENCOUNTER — Ambulatory Visit: Payer: Self-pay | Admitting: Internal Medicine

## 2024-03-23 ENCOUNTER — Telehealth: Payer: Self-pay | Admitting: Cardiovascular Disease

## 2024-03-23 ENCOUNTER — Other Ambulatory Visit: Payer: Self-pay | Admitting: Cardiovascular Disease

## 2024-03-23 MED ORDER — POTASSIUM CHLORIDE CRYS ER 20 MEQ PO TBCR: 40.0000 meq | EXTENDED_RELEASE_TABLET | Freq: Every day | ORAL | 1 refills | Status: AC

## 2024-03-23 NOTE — Telephone Encounter (Signed)
 Pt's medication was sent to pt's pharmacy as requested. Confirmation received.

## 2024-03-23 NOTE — Telephone Encounter (Signed)
*  STAT* If patient is at the pharmacy, call can be transferred to refill team.   1. Which medications need to be refilled? (please list name of each medication and dose if known) potassium chloride  SA (KLOR-CON  M) 20 MEQ tablet   2. Which pharmacy/location (including street and city if local pharmacy) is medication to be sent to? [facility] - [place], [place] - [address]    3. Do they need a 30 day or 90 day supply? 90

## 2024-03-27 ENCOUNTER — Ambulatory Visit: Attending: Internal Medicine

## 2024-03-27 DIAGNOSIS — I5022 Chronic systolic (congestive) heart failure: Secondary | ICD-10-CM

## 2024-03-27 DIAGNOSIS — Z95810 Presence of automatic (implantable) cardiac defibrillator: Secondary | ICD-10-CM | POA: Diagnosis not present

## 2024-03-30 NOTE — Progress Notes (Signed)
 EPIC Encounter for ICM Monitoring  Patient Name: Lori Beard is a 73 y.o. female Date: 03/30/2024 Primary Care Physican: Merilee, L.Addie, MD (Inactive) Primary Cardiologist: Nahser Electrophysiologist: Waddell 11/06/2022 Weight: 200 lbs 04/07/2023 Weight: 199 lbs 06/18/2023 Weight: 199 lbs 07/23/2023 Weight: 199 lbs 08/17/2023 Office Weight: 203 lbs 11/18/2023 Weight: 200-202 lbs     12/24/2023 Weight: 200-201 lbs  01/26/2024 Weight: 200 lbs   03/30/2024 Weight: 200 lbs                                           Spoke with patient and heart failure questions reviewed.  Transmission results reviewed.  Pt asymptomatic for fluid accumulation.  Reports feeling well at this time and voices no complaints.     Coruve thoracic impedance suggesting normal fluid levels within the last month.   Prescribed: Furosemide  20 mg take 1 tablet daily. Potassium 20 mEq take 2 tablets daily.   Labs: 07/20/2023 Creatinine 1.39, BUN 20, Potassium 4.3, Sodium 142, GFR 40 04/02/2023 Creatinine 1.33, BUN 13, Potassium 4.3, Sodium 141, GFR 43 A complete set of results can be found in Results Review.   Recommendations:   No changes and encouraged to call if experiencing any fluid symptoms.   Follow-up plan: ICM clinic phone appointment on 05/01/2024.   91 day device clinic remote transmission 05/10/2024.     EP/Cardiology Next Visit:  Recall 08/11/2024 with Dr Waddell.   Recall 07/14/2024 with Dr Alveta.   Copy of ICM check sent to Dr. Waddell.     3 month ICM trend: 03/27/2024.    12-14 Month ICM trend:     [name], RN 03/30/2024 7:28 AM

## 2024-04-03 NOTE — Progress Notes (Signed)
 Remote ICD transmission.

## 2024-04-03 NOTE — Addendum Note (Signed)
 Addended by: VICCI SELLER A on: 04/03/2024 03:06 PM   Modules accepted: Orders

## 2024-04-11 NOTE — Procedures (Signed)
 SABRA

## 2024-04-27 ENCOUNTER — Other Ambulatory Visit: Payer: Self-pay | Admitting: Family

## 2024-04-28 ENCOUNTER — Other Ambulatory Visit: Payer: Self-pay

## 2024-05-01 ENCOUNTER — Ambulatory Visit: Attending: Internal Medicine

## 2024-05-01 DIAGNOSIS — I5022 Chronic systolic (congestive) heart failure: Secondary | ICD-10-CM | POA: Diagnosis not present

## 2024-05-01 DIAGNOSIS — Z95810 Presence of automatic (implantable) cardiac defibrillator: Secondary | ICD-10-CM | POA: Diagnosis not present

## 2024-05-01 MED ORDER — CARVEDILOL 25 MG PO TABS: 25.0000 mg | ORAL_TABLET | Freq: Two times a day (BID) | ORAL | 0 refills | Status: DC

## 2024-05-02 ENCOUNTER — Telehealth: Payer: Self-pay

## 2024-05-02 NOTE — Progress Notes (Signed)
 EPIC Encounter for ICM Monitoring  Patient Name: Lori Beard is a 73 y.o. female Date: 05/02/2024 Primary Care Physican: Merilee, L.Addie, MD (Inactive) Primary Cardiologist: Nahser Electrophysiologist: Waddell 11/06/2022 Weight: 200 lbs 04/07/2023 Weight: 199 lbs 06/18/2023 Weight: 199 lbs 07/23/2023 Weight: 199 lbs 08/17/2023 Office Weight: 203 lbs 11/18/2023 Weight: 200-202 lbs     12/24/2023 Weight: 200-201 lbs  01/26/2024 Weight: 200 lbs   03/30/2024 Weight: 200 lbs                                           Attempted call to patient and unable to reach.  Left message for return call.  Transmission results reviewed.      Coruve thoracic impedance suggesting possible fluid accumulation starting 8/21.   Prescribed: Furosemide  20 mg take 1 tablet daily. Potassium 20 mEq take 2 tablets daily.   Labs: 07/20/2023 Creatinine 1.39, BUN 20, Potassium 4.3, Sodium 142, GFR 40 04/02/2023 Creatinine 1.33, BUN 13, Potassium 4.3, Sodium 141, GFR 43 A complete set of results can be found in Results Review.   Recommendations:   Unable to reach.     Follow-up plan: ICM clinic phone appointment on 05/10/2024 to recheck fluid levels.   91 day device clinic remote transmission 05/10/2024.     EP/Cardiology Next Visit:  Recall 08/11/2024 with Dr Waddell.   Recall 07/14/2024 with Dr Alveta.   Copy of ICM check sent to Dr. Waddell.     3 month ICM trend: 05/01/2024.    12-14 Month ICM trend:     [name], RN 05/02/2024 4:14 PM

## 2024-05-02 NOTE — Telephone Encounter (Signed)
Remote ICM transmission received.  Attempted call to patient regarding ICM remote transmission and left message to return call   

## 2024-05-10 ENCOUNTER — Ambulatory Visit: Attending: Internal Medicine

## 2024-05-10 ENCOUNTER — Ambulatory Visit: Payer: Self-pay

## 2024-05-10 DIAGNOSIS — I5022 Chronic systolic (congestive) heart failure: Secondary | ICD-10-CM

## 2024-05-10 DIAGNOSIS — Z95810 Presence of automatic (implantable) cardiac defibrillator: Secondary | ICD-10-CM

## 2024-05-10 NOTE — Progress Notes (Signed)
 EPIC Encounter for ICM Monitoring  Patient Name: Lori Beard is a 73 y.o. female Date: 05/10/2024 Primary Care Physican: Merilee, L.Addie, MD (Inactive) Primary Cardiologist: Nahser Electrophysiologist: Waddell 11/06/2022 Weight: 200 lbs 07/23/2023 Weight: 199 lbs 12/24/2023 Weight: 200-201 lbs  01/26/2024 Weight: 200 lbs   03/30/2024 Weight: 200 lbs                                           Transmission results reviewed.      Coruve thoracic impedance suggesting possible fluid accumulation starting 8/21 and returning to baseline normal 9/1.   Prescribed: Furosemide  20 mg take 1 tablet daily. Potassium 20 mEq take 2 tablets daily.   Labs: 07/20/2023 Creatinine 1.39, BUN 20, Potassium 4.3, Sodium 142, GFR 40 04/02/2023 Creatinine 1.33, BUN 13, Potassium 4.3, Sodium 141, GFR 43 A complete set of results can be found in Results Review.   Recommendations:   No changes.   Follow-up plan: ICM clinic phone appointment on 06/05/2024.   91 day device clinic remote transmission 08/09/2024.     EP/Cardiology Next Visit:  Recall 08/11/2024 with Dr Waddell.   Recall 07/14/2024 with Dr Alveta.   Copy of ICM check sent to Dr. Waddell.     3 month ICM trend: 05/10/2024.    12-14 Month ICM trend:     [name], RN 05/10/2024 7:25 AM

## 2024-05-11 ENCOUNTER — Telehealth: Payer: Self-pay

## 2024-05-11 LAB — CUP PACEART REMOTE DEVICE CHECK
Battery Remaining Longevity: 8 mo
Battery Remaining Percentage: 8 %
Battery Voltage: 2.65 V
Brady Statistic RV Percent Paced: 1 %
Date Time Interrogation Session: 20250903020017
HighPow Impedance: 78 Ohm
HighPow Impedance: 78 Ohm
Lead Channel Impedance Value: 360 Ohm
Lead Channel Pacing Threshold Amplitude: 0.75 V
Lead Channel Pacing Threshold Pulse Width: 0.5 ms
Lead Channel Sensing Intrinsic Amplitude: 8.7 mV
Lead Channel Setting Pacing Amplitude: 2.5 V
Lead Channel Setting Pacing Pulse Width: 0.5 ms
Lead Channel Setting Sensing Sensitivity: 0.5 mV
Pulse Gen Serial Number: 7136396

## 2024-05-11 NOTE — Telephone Encounter (Signed)
 Patient advised of checking battery monthly. Pt was appreciative of call back.

## 2024-05-11 NOTE — Telephone Encounter (Signed)
 Pt returning call to a nurse

## 2024-05-11 NOTE — Telephone Encounter (Addendum)
 Attempted to call patient to advise monthly battery checks have been added d/t 8.2 months on device remaining.   Attempted to contact patient. No answer, left message to call back.

## 2024-05-12 NOTE — Progress Notes (Deleted)
Remote ICD Transmission.

## 2024-05-13 DIAGNOSIS — N183 Chronic kidney disease, stage 3 unspecified: Secondary | ICD-10-CM | POA: Diagnosis not present

## 2024-05-13 DIAGNOSIS — I5042 Chronic combined systolic (congestive) and diastolic (congestive) heart failure: Secondary | ICD-10-CM | POA: Diagnosis not present

## 2024-05-13 DIAGNOSIS — I10 Essential (primary) hypertension: Secondary | ICD-10-CM | POA: Diagnosis not present

## 2024-05-14 ENCOUNTER — Ambulatory Visit: Payer: Self-pay | Admitting: Internal Medicine

## 2024-06-01 DIAGNOSIS — M25511 Pain in right shoulder: Secondary | ICD-10-CM | POA: Diagnosis not present

## 2024-06-05 ENCOUNTER — Ambulatory Visit: Attending: Internal Medicine

## 2024-06-05 ENCOUNTER — Encounter

## 2024-06-05 DIAGNOSIS — I5022 Chronic systolic (congestive) heart failure: Secondary | ICD-10-CM

## 2024-06-05 DIAGNOSIS — Z95810 Presence of automatic (implantable) cardiac defibrillator: Secondary | ICD-10-CM | POA: Diagnosis not present

## 2024-06-06 DIAGNOSIS — I5042 Chronic combined systolic (congestive) and diastolic (congestive) heart failure: Secondary | ICD-10-CM | POA: Diagnosis not present

## 2024-06-06 DIAGNOSIS — M150 Primary generalized (osteo)arthritis: Secondary | ICD-10-CM | POA: Diagnosis not present

## 2024-06-06 DIAGNOSIS — N183 Chronic kidney disease, stage 3 unspecified: Secondary | ICD-10-CM | POA: Diagnosis not present

## 2024-06-06 DIAGNOSIS — I10 Essential (primary) hypertension: Secondary | ICD-10-CM | POA: Diagnosis not present

## 2024-06-06 NOTE — Progress Notes (Signed)
 EPIC Encounter for ICM Monitoring  Patient Name: Lori Beard is a 73 y.o. female Date: 06/06/2024 Primary Care Physican: Merilee, L.Addie, MD (Inactive) Primary Cardiologist: Nahser Electrophysiologist: Waddell 11/06/2022 Weight: 200 lbs 07/23/2023 Weight: 199 lbs 12/24/2023 Weight: 200-201 lbs  01/26/2024 Weight: 200 lbs   03/30/2024 Weight: 200 lbs 06/05/2024 Weight: 200 lbs                                           Spoke with patient and heart failure questions reviewed.  Transmission results reviewed.  Pt asymptomatic for fluid accumulation.  Reports feeling well at this time and voices no complaints.  She is drinking greater than 64 oz fluid daily.     Diet:  06/06/2024 Drinks more than 64 oz daily  Since Coruve thoracic impedance suggesting possible fluid accumulation starting 06/01/2024.   Prescribed: Furosemide  20 mg take 1 tablet daily.   06/05/2024 Confirmed she is compliant with taking as prescribed. Potassium 20 mEq take 2 tablets daily.   Labs: 07/20/2023 Creatinine 1.39, BUN 20, Potassium 4.3, Sodium 142, GFR 40 04/02/2023 Creatinine 1.33, BUN 13, Potassium 4.3, Sodium 141, GFR 43 A complete set of results can be found in Results Review.   Recommendations:   Recommendation to limit salt intake to 2000 mg daily and fluid intake to 64 oz daily.  Encouraged to call if experiencing any fluid symptoms.    Follow-up plan: ICM clinic phone appointment on 06/19/2024 to recheck fluid levels.   91 day device clinic remote transmission 08/09/2024.     EP/Cardiology Next Visit:  Recall 08/11/2024 with Dr Waddell.   Recall 07/14/2024 with Dr Alveta.   Copy of ICM check sent to Dr. Waddell.   Remote monitoring is medically necessary for Heart Failure Management.    90 day Daily Thoracic Impedance ICM trend: 03/06/2024 through 06/05/2024.    12-14 Month Thoracic Impedance ICM trend:     [name], RN 06/06/2024 2:25 PM

## 2024-06-11 ENCOUNTER — Other Ambulatory Visit: Payer: Self-pay | Admitting: Family

## 2024-06-12 ENCOUNTER — Ambulatory Visit

## 2024-06-12 DIAGNOSIS — I10 Essential (primary) hypertension: Secondary | ICD-10-CM | POA: Diagnosis not present

## 2024-06-12 DIAGNOSIS — I5042 Chronic combined systolic (congestive) and diastolic (congestive) heart failure: Secondary | ICD-10-CM | POA: Diagnosis not present

## 2024-06-12 DIAGNOSIS — N183 Chronic kidney disease, stage 3 unspecified: Secondary | ICD-10-CM | POA: Diagnosis not present

## 2024-06-14 LAB — CUP PACEART REMOTE DEVICE CHECK
Battery Remaining Longevity: 8 mo
Battery Remaining Percentage: 8 %
Battery Voltage: 2.65 V
Brady Statistic RV Percent Paced: 1 %
Date Time Interrogation Session: 20251006023559
HighPow Impedance: 69 Ohm
HighPow Impedance: 69 Ohm
Lead Channel Impedance Value: 490 Ohm
Lead Channel Pacing Threshold Amplitude: 0.75 V
Lead Channel Pacing Threshold Pulse Width: 0.5 ms
Lead Channel Sensing Intrinsic Amplitude: 8.9 mV
Lead Channel Setting Pacing Amplitude: 2.5 V
Lead Channel Setting Pacing Pulse Width: 0.5 ms
Lead Channel Setting Sensing Sensitivity: 0.5 mV
Pulse Gen Serial Number: 7136396

## 2024-06-15 ENCOUNTER — Ambulatory Visit: Payer: Self-pay | Admitting: Internal Medicine

## 2024-06-16 DIAGNOSIS — M25562 Pain in left knee: Secondary | ICD-10-CM | POA: Diagnosis not present

## 2024-06-19 ENCOUNTER — Ambulatory Visit: Attending: Internal Medicine

## 2024-06-19 DIAGNOSIS — I5022 Chronic systolic (congestive) heart failure: Secondary | ICD-10-CM

## 2024-06-19 DIAGNOSIS — Z95810 Presence of automatic (implantable) cardiac defibrillator: Secondary | ICD-10-CM

## 2024-06-21 NOTE — Progress Notes (Signed)
 EPIC Encounter for ICM Monitoring  Patient Name: Lori Beard is a 73 y.o. female Date: 06/21/2024 Primary Care Physican: Merilee, L.Addie, MD (Inactive) Primary Cardiologist: Nahser Electrophysiologist: Waddell 11/06/2022 Weight: 200 lbs 07/23/2023 Weight: 199 lbs 12/24/2023 Weight: 200-201 lbs  01/26/2024 Weight: 200 lbs   03/30/2024 Weight: 200 lbs 06/05/2024 Weight: 200 lbs     06/21/2024 Weight: 200 lbs   Battery Longevity: 8.2 months                                       Spoke with patient and heart failure questions reviewed.  Transmission results reviewed.  Pt asymptomatic for fluid accumulation.  Reports feeling well at this time and voices no complaints.     Diet:  06/06/2024 Drinks more than 64 oz daily   Since 06/05/2024 ICM Remote Transmission:   Coruve thoracic impedance suggesting possible fluid accumulation started 06/01/2024 and returned to normal 06/05/2024.   Prescribed: Furosemide  20 mg take 1 tablet daily.   06/05/2024 Confirmed she is compliant with taking as prescribed. Potassium 20 mEq take 2 tablets daily.   Labs: 07/20/2023 Creatinine 1.39, BUN 20, Potassium 4.3, Sodium 142, GFR 40 04/02/2023 Creatinine 1.33, BUN 13, Potassium 4.3, Sodium 141, GFR 43 A complete set of results can be found in Results Review.   Recommendations:  No changes and encouraged to call if experiencing any fluid symptoms.   Follow-up plan: ICM clinic phone appointment on 07/10/2024.   91 day device clinic remote transmission 08/09/2024.     EP/Cardiology Next Visit:  Recall 08/11/2024 with Dr Waddell.   Recall 07/14/2024 with Dr Alveta.   Copy of ICM check sent to Dr. Waddell.   Remote monitoring is medically necessary for Heart Failure Management.    Daily Thoracic Impedance ICM trend: 03/21/2024 through 06/19/2024.    12-14 Month Thoracic Impedance ICM trend:     Lori GORMAN Garner, RN 06/21/2024 1:44 PM

## 2024-06-28 DIAGNOSIS — R600 Localized edema: Secondary | ICD-10-CM | POA: Diagnosis not present

## 2024-06-28 DIAGNOSIS — I5042 Chronic combined systolic (congestive) and diastolic (congestive) heart failure: Secondary | ICD-10-CM | POA: Diagnosis not present

## 2024-06-28 DIAGNOSIS — G4700 Insomnia, unspecified: Secondary | ICD-10-CM | POA: Diagnosis not present

## 2024-06-28 DIAGNOSIS — N183 Chronic kidney disease, stage 3 unspecified: Secondary | ICD-10-CM | POA: Diagnosis not present

## 2024-06-28 DIAGNOSIS — Z95810 Presence of automatic (implantable) cardiac defibrillator: Secondary | ICD-10-CM | POA: Diagnosis not present

## 2024-07-07 DIAGNOSIS — I5042 Chronic combined systolic (congestive) and diastolic (congestive) heart failure: Secondary | ICD-10-CM | POA: Diagnosis not present

## 2024-07-07 DIAGNOSIS — N183 Chronic kidney disease, stage 3 unspecified: Secondary | ICD-10-CM | POA: Diagnosis not present

## 2024-07-07 DIAGNOSIS — M150 Primary generalized (osteo)arthritis: Secondary | ICD-10-CM | POA: Diagnosis not present

## 2024-07-07 DIAGNOSIS — I10 Essential (primary) hypertension: Secondary | ICD-10-CM | POA: Diagnosis not present

## 2024-07-10 ENCOUNTER — Ambulatory Visit: Attending: Internal Medicine

## 2024-07-10 DIAGNOSIS — Z95810 Presence of automatic (implantable) cardiac defibrillator: Secondary | ICD-10-CM | POA: Diagnosis not present

## 2024-07-10 DIAGNOSIS — I5022 Chronic systolic (congestive) heart failure: Secondary | ICD-10-CM

## 2024-07-12 DIAGNOSIS — I5042 Chronic combined systolic (congestive) and diastolic (congestive) heart failure: Secondary | ICD-10-CM | POA: Diagnosis not present

## 2024-07-12 DIAGNOSIS — N183 Chronic kidney disease, stage 3 unspecified: Secondary | ICD-10-CM | POA: Diagnosis not present

## 2024-07-12 DIAGNOSIS — I10 Essential (primary) hypertension: Secondary | ICD-10-CM | POA: Diagnosis not present

## 2024-07-13 ENCOUNTER — Ambulatory Visit

## 2024-07-14 ENCOUNTER — Telehealth: Payer: Self-pay

## 2024-07-14 LAB — CUP PACEART REMOTE DEVICE CHECK
Battery Remaining Longevity: 7 mo
Battery Remaining Percentage: 6 %
Battery Voltage: 2.63 V
Brady Statistic RV Percent Paced: 1 %
Date Time Interrogation Session: 20251106020016
HighPow Impedance: 92 Ohm
HighPow Impedance: 92 Ohm
Lead Channel Impedance Value: 440 Ohm
Lead Channel Pacing Threshold Amplitude: 0.75 V
Lead Channel Pacing Threshold Pulse Width: 0.5 ms
Lead Channel Sensing Intrinsic Amplitude: 8.8 mV
Lead Channel Setting Pacing Amplitude: 2.5 V
Lead Channel Setting Pacing Pulse Width: 0.5 ms
Lead Channel Setting Sensing Sensitivity: 0.5 mV
Pulse Gen Serial Number: 7136396

## 2024-07-14 NOTE — Progress Notes (Signed)
 Spoke with patient and heart failure questions reviewed.  Transmission results reviewed.  Pt asymptomatic for fluid accumulation.  She reports numbness in tingling in pinky and wanted to make sure it was not related to her heart.  Advised to call PCP if needed but those symptoms suggest something like nerve pain.  She is doing well otherwise.  No changes and encouraged to call if experiencing any fluid symptoms.

## 2024-07-14 NOTE — Progress Notes (Signed)
 EPIC Encounter for ICM Monitoring  Patient Name: Lori Beard is a 73 y.o. female Date: 07/14/2024 Primary Care Physican: Merilee, L.Addie, MD (Inactive) Primary Cardiologist: Nahser Electrophysiologist: Waddell 11/06/2022 Weight: 200 lbs 07/23/2023 Weight: 199 lbs 12/24/2023 Weight: 200-201 lbs  01/26/2024 Weight: 200 lbs   03/30/2024 Weight: 200 lbs 06/05/2024 Weight: 200 lbs     06/21/2024 Weight: 200 lbs    Battery Longevity: 7 months                                       Attempted call to patient and unable to reach.    Transmission results reviewed.      Diet:  06/06/2024 Drinks more than 64 oz daily   Since 06/19/2024 ICM Remote Transmission:   Coruve thoracic impedance suggesting possible fluid accumulation from 06/19/2024-06/28/2024.  Suggesting intermittent days with possible dryness from 06/29/2024-07/13/2024.    Prescribed: Furosemide  20 mg take 1 tablet daily.   06/05/2024 Confirmed she is compliant with taking as prescribed. Potassium 20 mEq take 2 tablets daily.   Labs: 07/20/2023 Creatinine 1.39, BUN 20, Potassium 4.3, Sodium 142, GFR 40 04/02/2023 Creatinine 1.33, BUN 13, Potassium 4.3, Sodium 141, GFR 43 A complete set of results can be found in Results Review.   Recommendations:  Unable to reach.     Follow-up plan: ICM clinic phone appointment on 08/22/2024.   91 day device clinic remote transmission 08/09/2024.     EP/Cardiology Next Visit:  Recall 08/11/2024 with Dr Waddell.   Recall 07/14/2024 with Dr Alveta.   Copy of ICM check sent to Dr. Waddell.   Remote monitoring is medically necessary for Heart Failure Management.    Daily Thoracic Impedance ICM trend: 04/14/2024 through 07/13/2024.    12-14 Month Thoracic Impedance ICM trend:     Lori GORMAN Garner, RN 07/14/2024 8:05 AM

## 2024-07-14 NOTE — Telephone Encounter (Signed)
 Remote ICM transmission received.  Attempted call to patient regarding ICM remote transmission.  Left detailed message per DPR with ICM phone number to return call for any questions, concerns or fluid symptoms.

## 2024-07-17 ENCOUNTER — Ambulatory Visit: Payer: Self-pay | Admitting: Internal Medicine

## 2024-08-02 ENCOUNTER — Other Ambulatory Visit: Payer: Self-pay | Admitting: Physician Assistant

## 2024-08-06 DIAGNOSIS — M150 Primary generalized (osteo)arthritis: Secondary | ICD-10-CM | POA: Diagnosis not present

## 2024-08-06 DIAGNOSIS — N183 Chronic kidney disease, stage 3 unspecified: Secondary | ICD-10-CM | POA: Diagnosis not present

## 2024-08-06 DIAGNOSIS — I10 Essential (primary) hypertension: Secondary | ICD-10-CM | POA: Diagnosis not present

## 2024-08-06 DIAGNOSIS — I5042 Chronic combined systolic (congestive) and diastolic (congestive) heart failure: Secondary | ICD-10-CM | POA: Diagnosis not present

## 2024-08-07 ENCOUNTER — Telehealth: Payer: Self-pay

## 2024-08-07 NOTE — Telephone Encounter (Signed)
 Pt called in stating that her monitor is not working. We have tried some trouble shooting, nothing worked so pt will call tech support

## 2024-08-13 ENCOUNTER — Ambulatory Visit: Attending: Internal Medicine

## 2024-08-14 ENCOUNTER — Encounter

## 2024-08-14 LAB — CUP PACEART REMOTE DEVICE CHECK
Battery Remaining Longevity: 6 mo
Battery Remaining Percentage: 6 %
Battery Voltage: 2.63 V
Brady Statistic RV Percent Paced: 1 %
Date Time Interrogation Session: 20251208020016
HighPow Impedance: 72 Ohm
HighPow Impedance: 72 Ohm
Lead Channel Impedance Value: 460 Ohm
Lead Channel Pacing Threshold Amplitude: 0.75 V
Lead Channel Pacing Threshold Pulse Width: 0.5 ms
Lead Channel Sensing Intrinsic Amplitude: 11 mV
Lead Channel Setting Pacing Amplitude: 2.5 V
Lead Channel Setting Pacing Pulse Width: 0.5 ms
Lead Channel Setting Sensing Sensitivity: 0.5 mV
Pulse Gen Serial Number: 7136396

## 2024-08-16 ENCOUNTER — Ambulatory Visit: Payer: Self-pay | Admitting: Internal Medicine

## 2024-08-22 ENCOUNTER — Telehealth: Payer: Self-pay

## 2024-08-22 ENCOUNTER — Ambulatory Visit: Attending: Internal Medicine

## 2024-08-22 DIAGNOSIS — I5022 Chronic systolic (congestive) heart failure: Secondary | ICD-10-CM

## 2024-08-22 DIAGNOSIS — Z95810 Presence of automatic (implantable) cardiac defibrillator: Secondary | ICD-10-CM

## 2024-08-22 NOTE — Progress Notes (Signed)
 Remote ICD Transmission

## 2024-08-22 NOTE — Telephone Encounter (Signed)
 Remote ICM transmission received.  Attempted call to patient regarding ICM remote transmission and no answer.

## 2024-08-22 NOTE — Progress Notes (Cosign Needed)
 EPIC Encounter for ICM Monitoring  Patient Name: Nalda Shackleford is a 73 y.o. female Date: 08/22/2024 Primary Care Physican: Merilee, L.Addie, MD (Inactive) Primary Cardiologist: Nahser Electrophysiologist: Waddell 11/06/2022 Weight: 200 lbs 07/23/2023 Weight: 199 lbs 12/24/2023 Weight: 200-201 lbs  01/26/2024 Weight: 200 lbs   03/30/2024 Weight: 200 lbs 06/05/2024 Weight: 200 lbs     06/21/2024 Weight: 200 lbs    Battery Longevity: 6.1 months                                       Attempted call to patient and unable to reach.    Transmission results reviewed.      Diet:  Often Drinks more than 64 oz daily   Since 06/19/2024 ICM Remote Transmission:   Coruve thoracic impedance suggesting normal fluid levels with the exception of possible fluid accumulation starting 08/17/2024.    Prescribed: Furosemide  20 mg take 1 tablet daily.  Potassium 20 mEq take 2 tablets daily.   Labs: 07/20/2023 Creatinine 1.39, BUN 20, Potassium 4.3, Sodium 142, GFR 40 04/02/2023 Creatinine 1.33, BUN 13, Potassium 4.3, Sodium 141, GFR 43 A complete set of results can be found in Results Review.   Recommendations:  Unable to reach.     Follow-up plan: ICM clinic phone appointment on 08/30/2024 to recheck fluid levels.   91 day device clinic remote transmission 11/14/2024.     EP/Cardiology Next Visit:  Recall 08/11/2024 with Dr Waddell.   Recall 07/14/2024 with Dr Alveta.   Copy of ICM check sent to Dr. Waddell.   Remote monitoring is medically necessary for Heart Failure Management.    Daily Thoracic Impedance ICM trend: 05/24/2024 through 08/22/2024.    12-14 Month Thoracic Impedance ICM trend:     [name], RN 08/22/2024 5:07 PM

## 2024-08-30 ENCOUNTER — Ambulatory Visit

## 2024-08-30 ENCOUNTER — Telehealth: Payer: Self-pay

## 2024-08-30 NOTE — Telephone Encounter (Signed)
 No remote transmission received.  ICM Remote Transmission rescheduled for 09/25/2024.

## 2024-08-30 NOTE — Telephone Encounter (Signed)
 Attempted call to patient.  Left message requesting manual remote transmission.  Left call back number.

## 2024-09-01 NOTE — Telephone Encounter (Signed)
 ICM transmission received on 08/30/24.

## 2024-09-04 NOTE — Telephone Encounter (Signed)
 Attempted ICM Call to patient to ask for updated manual transmission and no answer.

## 2024-09-12 ENCOUNTER — Other Ambulatory Visit: Payer: Self-pay | Admitting: Family

## 2024-09-13 ENCOUNTER — Ambulatory Visit

## 2024-09-13 MED ORDER — FUROSEMIDE 20 MG PO TABS: 20.0000 mg | ORAL_TABLET | Freq: Every morning | ORAL | 0 refills | Status: DC

## 2024-09-14 ENCOUNTER — Encounter

## 2024-09-19 LAB — CUP PACEART REMOTE DEVICE CHECK
Battery Remaining Longevity: 6 mo
Battery Remaining Percentage: 5 %
Battery Voltage: 2.62 V
Brady Statistic RV Percent Paced: 1 %
Date Time Interrogation Session: 20260107020019
HighPow Impedance: 80 Ohm
HighPow Impedance: 80 Ohm
Lead Channel Impedance Value: 410 Ohm
Lead Channel Pacing Threshold Amplitude: 0.75 V
Lead Channel Pacing Threshold Pulse Width: 0.5 ms
Lead Channel Sensing Intrinsic Amplitude: 8.9 mV
Lead Channel Setting Pacing Amplitude: 2.5 V
Lead Channel Setting Pacing Pulse Width: 0.5 ms
Lead Channel Setting Sensing Sensitivity: 0.5 mV
Pulse Gen Serial Number: 7136396

## 2024-09-25 ENCOUNTER — Ambulatory Visit: Attending: Cardiovascular Disease

## 2024-09-25 DIAGNOSIS — Z95810 Presence of automatic (implantable) cardiac defibrillator: Secondary | ICD-10-CM

## 2024-09-25 DIAGNOSIS — I5022 Chronic systolic (congestive) heart failure: Secondary | ICD-10-CM | POA: Diagnosis not present

## 2024-09-26 ENCOUNTER — Telehealth: Payer: Self-pay

## 2024-09-26 NOTE — Progress Notes (Signed)
 EPIC Encounter for ICM Monitoring  Patient Name: Lori Beard is a 74 y.o. female Date: 09/26/2024 Primary Care Physican: Merilee, L.Addie, MD (Inactive) Primary Cardiologist: Nahser Electrophysiologist: Mealor 11/06/2022 Weight: 200 lbs 07/23/2023 Weight: 199 lbs 12/24/2023 Weight: 200-201 lbs  01/26/2024 Weight: 200 lbs   03/30/2024 Weight: 200 lbs 06/05/2024 Weight: 200 lbs     06/21/2024 Weight: 200 lbs    Battery Longevity: 5.4 months                                       Attempted call to patient and unable to reach.    Transmission results reviewed.    Diet:  Often Drinks more than 64 oz daily   Since 08/22/2024 ICM Remote Transmission:   Coruve thoracic impedance suggesting normal fluid levels since 09/06/2024.    Prescribed: Furosemide  20 mg take 1 tablet daily.  Potassium 20 mEq take 2 tablets daily.   Labs: 07/20/2023 Creatinine 1.39, BUN 20, Potassium 4.3, Sodium 142, GFR 40 04/02/2023 Creatinine 1.33, BUN 13, Potassium 4.3, Sodium 141, GFR 43 A complete set of results can be found in Results Review.   Recommendations:  Unable to reach.     Follow-up plan: ICM clinic phone appointment on 10/26/2024.   91 day device clinic remote transmission 11/14/2024.     EP/Cardiology Next Visit:  None scheduled   Copy of ICM check sent to Dr. Nancey.     Remote monitoring is medically necessary for Heart Failure Management.    Daily Thoracic Impedance ICM trend: 06/27/2024 through 09/25/2024.    12-14 Month Thoracic Impedance ICM trend:     [name], RN 09/26/2024 7:54 AM

## 2024-09-26 NOTE — Telephone Encounter (Signed)
 Remote ICM transmission received.  Attempted call to patient regarding ICM remote transmission and no answer.

## 2024-10-03 ENCOUNTER — Other Ambulatory Visit: Payer: Self-pay | Admitting: Family

## 2024-10-05 NOTE — Progress Notes (Signed)
 31 day ICM Remote transmission canceled due to [facility] clinic is on hold until further notice.  91 day remote monitoring will continue per protocol.

## 2024-10-07 ENCOUNTER — Other Ambulatory Visit: Payer: Self-pay | Admitting: Family

## 2024-10-09 NOTE — Telephone Encounter (Signed)
 In accordance with refill protocols, please review and address the following requirements before this medication refill can be authorized:  Labs

## 2024-10-14 ENCOUNTER — Ambulatory Visit

## 2024-10-16 ENCOUNTER — Encounter

## 2024-10-26 ENCOUNTER — Ambulatory Visit

## 2024-11-14 ENCOUNTER — Ambulatory Visit

## 2024-11-16 ENCOUNTER — Encounter

## 2024-12-15 ENCOUNTER — Ambulatory Visit

## 2024-12-18 ENCOUNTER — Encounter

## 2025-01-15 ENCOUNTER — Ambulatory Visit

## 2025-01-18 ENCOUNTER — Encounter
